# Patient Record
Sex: Female | Born: 1935 | Race: Black or African American | Hispanic: No | Smoking: Never smoker
Health system: Southern US, Community
[De-identification: ages and names within clinical notes are randomized; demographics above are authoritative.]

## PROBLEM LIST (undated history)

## (undated) DIAGNOSIS — E785 Hyperlipidemia, unspecified: Secondary | ICD-10-CM

## (undated) DIAGNOSIS — I809 Phlebitis and thrombophlebitis of unspecified site: Secondary | ICD-10-CM

## (undated) DIAGNOSIS — I519 Heart disease, unspecified: Secondary | ICD-10-CM

## (undated) DIAGNOSIS — G709 Myoneural disorder, unspecified: Secondary | ICD-10-CM

## (undated) DIAGNOSIS — Z972 Presence of dental prosthetic device (complete) (partial): Secondary | ICD-10-CM

## (undated) DIAGNOSIS — F419 Anxiety disorder, unspecified: Secondary | ICD-10-CM

## (undated) DIAGNOSIS — K08109 Complete loss of teeth, unspecified cause, unspecified class: Secondary | ICD-10-CM

## (undated) DIAGNOSIS — I10 Essential (primary) hypertension: Secondary | ICD-10-CM

## (undated) DIAGNOSIS — M1990 Unspecified osteoarthritis, unspecified site: Secondary | ICD-10-CM

## (undated) DIAGNOSIS — L98499 Non-pressure chronic ulcer of skin of other sites with unspecified severity: Secondary | ICD-10-CM

## (undated) DIAGNOSIS — K219 Gastro-esophageal reflux disease without esophagitis: Secondary | ICD-10-CM

## (undated) DIAGNOSIS — Z973 Presence of spectacles and contact lenses: Secondary | ICD-10-CM

## (undated) DIAGNOSIS — I5043 Acute on chronic combined systolic (congestive) and diastolic (congestive) heart failure: Secondary | ICD-10-CM

## (undated) DIAGNOSIS — I071 Rheumatic tricuspid insufficiency: Secondary | ICD-10-CM

## (undated) DIAGNOSIS — R569 Unspecified convulsions: Secondary | ICD-10-CM

## (undated) DIAGNOSIS — I429 Cardiomyopathy, unspecified: Secondary | ICD-10-CM

## (undated) DIAGNOSIS — Z9289 Personal history of other medical treatment: Secondary | ICD-10-CM

## (undated) DIAGNOSIS — I739 Peripheral vascular disease, unspecified: Secondary | ICD-10-CM

## (undated) HISTORY — PX: ABDOMINAL HYSTERECTOMY: SHX81

## (undated) HISTORY — DX: Personal history of other medical treatment: Z92.89

## (undated) HISTORY — PX: EYE SURGERY: SHX253

---

## 1989-08-03 HISTORY — PX: CATARACT EXTRACTION W/ INTRAOCULAR LENS  IMPLANT, BILATERAL: SHX1307

## 1989-08-03 HISTORY — PX: OTHER SURGICAL HISTORY: SHX169

## 1998-06-15 ENCOUNTER — Ambulatory Visit (HOSPITAL_COMMUNITY): Admission: RE | Admit: 1998-06-15 | Discharge: 1998-06-15 | Payer: Self-pay | Admitting: *Deleted

## 1999-02-06 ENCOUNTER — Ambulatory Visit (HOSPITAL_COMMUNITY): Admission: RE | Admit: 1999-02-06 | Discharge: 1999-02-06 | Payer: Self-pay | Admitting: Ophthalmology

## 1999-06-28 ENCOUNTER — Ambulatory Visit (HOSPITAL_COMMUNITY): Admission: RE | Admit: 1999-06-28 | Discharge: 1999-06-28 | Payer: Self-pay | Admitting: *Deleted

## 1999-07-06 ENCOUNTER — Ambulatory Visit (HOSPITAL_COMMUNITY): Admission: RE | Admit: 1999-07-06 | Discharge: 1999-07-06 | Payer: Self-pay | Admitting: *Deleted

## 2000-07-09 ENCOUNTER — Encounter: Payer: Self-pay | Admitting: Internal Medicine

## 2000-07-09 ENCOUNTER — Ambulatory Visit (HOSPITAL_COMMUNITY): Admission: RE | Admit: 2000-07-09 | Discharge: 2000-07-09 | Payer: Self-pay | Admitting: Internal Medicine

## 2001-07-14 ENCOUNTER — Ambulatory Visit (HOSPITAL_COMMUNITY): Admission: RE | Admit: 2001-07-14 | Discharge: 2001-07-14 | Payer: Self-pay | Admitting: Internal Medicine

## 2001-07-14 ENCOUNTER — Encounter: Payer: Self-pay | Admitting: Internal Medicine

## 2001-09-12 ENCOUNTER — Ambulatory Visit (HOSPITAL_COMMUNITY): Admission: RE | Admit: 2001-09-12 | Discharge: 2001-09-12 | Payer: Self-pay | Admitting: Ophthalmology

## 2002-03-09 ENCOUNTER — Encounter: Admission: RE | Admit: 2002-03-09 | Discharge: 2002-06-07 | Payer: Self-pay | Admitting: Internal Medicine

## 2002-07-15 ENCOUNTER — Ambulatory Visit (HOSPITAL_COMMUNITY): Admission: RE | Admit: 2002-07-15 | Discharge: 2002-07-15 | Payer: Self-pay | Admitting: Internal Medicine

## 2002-07-15 ENCOUNTER — Encounter: Payer: Self-pay | Admitting: Internal Medicine

## 2003-01-27 ENCOUNTER — Encounter: Payer: Self-pay | Admitting: Ophthalmology

## 2003-02-01 ENCOUNTER — Ambulatory Visit (HOSPITAL_COMMUNITY): Admission: RE | Admit: 2003-02-01 | Discharge: 2003-02-01 | Payer: Self-pay | Admitting: Ophthalmology

## 2003-02-25 ENCOUNTER — Encounter: Payer: Self-pay | Admitting: Internal Medicine

## 2003-02-25 ENCOUNTER — Ambulatory Visit (HOSPITAL_COMMUNITY): Admission: RE | Admit: 2003-02-25 | Discharge: 2003-02-25 | Payer: Self-pay | Admitting: Internal Medicine

## 2003-07-06 ENCOUNTER — Ambulatory Visit (HOSPITAL_COMMUNITY): Admission: RE | Admit: 2003-07-06 | Discharge: 2003-07-06 | Payer: Self-pay | Admitting: Internal Medicine

## 2003-07-06 ENCOUNTER — Encounter: Payer: Self-pay | Admitting: Internal Medicine

## 2003-08-20 ENCOUNTER — Ambulatory Visit (HOSPITAL_COMMUNITY): Admission: RE | Admit: 2003-08-20 | Discharge: 2003-08-20 | Payer: Self-pay | Admitting: Ophthalmology

## 2004-07-06 ENCOUNTER — Ambulatory Visit (HOSPITAL_COMMUNITY): Admission: RE | Admit: 2004-07-06 | Discharge: 2004-07-06 | Payer: Self-pay | Admitting: Internal Medicine

## 2005-07-09 ENCOUNTER — Ambulatory Visit (HOSPITAL_COMMUNITY): Admission: RE | Admit: 2005-07-09 | Discharge: 2005-07-09 | Payer: Self-pay | Admitting: Internal Medicine

## 2006-07-09 ENCOUNTER — Ambulatory Visit (HOSPITAL_COMMUNITY): Admission: RE | Admit: 2006-07-09 | Discharge: 2006-07-09 | Payer: Self-pay | Admitting: Internal Medicine

## 2007-07-14 ENCOUNTER — Ambulatory Visit (HOSPITAL_COMMUNITY): Admission: RE | Admit: 2007-07-14 | Discharge: 2007-07-14 | Payer: Self-pay | Admitting: Internal Medicine

## 2007-07-23 ENCOUNTER — Ambulatory Visit: Payer: Self-pay | Admitting: Gastroenterology

## 2007-08-06 ENCOUNTER — Ambulatory Visit: Payer: Self-pay | Admitting: Gastroenterology

## 2007-09-05 ENCOUNTER — Encounter: Admission: RE | Admit: 2007-09-05 | Discharge: 2007-09-05 | Payer: Self-pay | Admitting: Internal Medicine

## 2008-06-29 DIAGNOSIS — Z9289 Personal history of other medical treatment: Secondary | ICD-10-CM

## 2008-06-29 HISTORY — DX: Personal history of other medical treatment: Z92.89

## 2008-08-17 ENCOUNTER — Ambulatory Visit (HOSPITAL_COMMUNITY): Admission: RE | Admit: 2008-08-17 | Discharge: 2008-08-17 | Payer: Self-pay | Admitting: Internal Medicine

## 2009-08-22 ENCOUNTER — Ambulatory Visit (HOSPITAL_COMMUNITY): Admission: RE | Admit: 2009-08-22 | Discharge: 2009-08-22 | Payer: Self-pay | Admitting: Internal Medicine

## 2010-08-28 ENCOUNTER — Ambulatory Visit (HOSPITAL_COMMUNITY): Admission: RE | Admit: 2010-08-28 | Discharge: 2010-08-28 | Payer: Self-pay | Admitting: Internal Medicine

## 2011-04-09 DIAGNOSIS — B351 Tinea unguium: Secondary | ICD-10-CM | POA: Insufficient documentation

## 2011-04-20 NOTE — Op Note (Signed)
[facility]  Patient:    [name], [name] Visit Number: [id number] MRN: [id number]          Service Type: DSU Location: [facility] 2899 12 Attending Physician:  Karenann Cai Dictated by:   Marya Landry Carlyle Lipa., M.D. Admit Date:  09/12/2001                             Operative Report  PREOPERATIVE DIAGNOSIS:  Opaque posterior capsule, right eye.  POSTOPERATIVE DIAGNOSIS:  Opaque posterior capsule, right eye.  PROCEDURE:  YAG laser capsulotomy of the right eye.  ANESTHESIA:  none.  JUSTIFICATION FOR PROCEDURE:  This is a 77 year old lady who underwent cataract extraction from the right eye several  months ago.  She has recently begun to complain of difficulty seeing to read with blurring of vision.  She was evaluated and found to have a visual acuity on the right best corrected to 20/50 with opacification of the posterior capsule.  YAG  laser capsulotomy was recommended, and she is admitted at this time for that purpose.  DESCRIPTION OF PROCEDURE:  The patient was brought to the laser room and positioned appropriately behind the laser.  Approximately 207 applications of laser energy at 2.5 millijoules were applied to the posterior capsule, obtaining an opening of 3-4 mm.  The patient tolerated the procedure well and is discharged in satisfactory condition with instructions to see me in the office this afternoon for evaluation of her intraocular pressure.  DISCHARGE DIAGNOSIS:  Opaque posterior capsule, right eye. Dictated by:   Marya Landry Carlyle Lipa., M.D. Attending Physician:  Karenann Cai DD:  09/12/01 TD:  09/12/01 Job: [id number]

## 2011-04-20 NOTE — Op Note (Signed)
NAMEPALAK, [name]                          ACCOUNT NO.:  [id number]   MEDICAL RECORD NO.:  [id number]                   PATIENT TYPE:  OIB   LOCATION:  2855                                 FACILITY:  [facility]   PHYSICIAN:  Salley Scarlet., M.D.         DATE OF BIRTH:  [birth year]   DATE OF PROCEDURE:  02/01/2003  DATE OF DISCHARGE:  02/01/2003                                 OPERATIVE REPORT   PREOPERATIVE DIAGNOSIS:  Immature cataract, left eye.   POSTOPERATIVE DIAGNOSIS:  Immature cataract, left eye.   PROCEDURE:  Kelman phacoemulsification, cataract, left eye.   ANESTHESIA:  Local using Xylocaine 2% with Marcaine 0.75% and Wydase.   JUSTIFICATION FOR PROCEDURE:  This is a 77 year old lady who is diabetic,  who has had cataract extracted from the right eye several years ago.  She  now complains of blurring of vision with difficulty seeing to read.  She was  evaluated and found to have a visual acuity in the left eye best corrected  at 20/50.  There was a posterior subcapsular cataract with 2+ nuclear  sclerosis present.  Cataract extraction with intraocular lens implantation  was recommended.  She was admitted at this time for that purpose.   DESCRIPTION OF PROCEDURE:  Under the influence of IV sedation, a Van Lint  akinesia and retrobulbar anesthesia were given.  The patient was prepped and  draped in the usual manner.  The lid speculum was inserted under the upper  and lower lid of the left eye and a 4-0 silk traction suture was passed  through the belly of the superior rectus muscle for traction.  A fornix-  based conjunctival flap was turned and hemostasis achieved by using cautery.  An incision was made in the sclera approximately 1 mm posterior to the  limbus.  This incision was dissected down to clear cornea using the crescent  blade.  A side port incision was made at the 1:30 clock hour position.  Occucoat was injected into the eye through the side port incision.   The  anterior chamber was then entered through the corneoscleral tunnel incision  at the 11:30 clock hour position using a 2.7 mm keratome.  An anterior  capsulotomy was done using a bent 25-gauge needle.  The nucleus was  hydrodissected using Xylocaine.  The KPE handpiece was passed into the eye  and the nucleus was emulsified without difficulty.  The residual cortical  material was aspirated.  The posterior capsule was polished using an olive-  tip polisher.  The wound was widened slightly to accommodate an oval 5 x 6  PMMA lens.  This lens was seated into the eye behind the iris without  difficulty.  The anterior chamber was reformed and the pupil was constricted  using Miochol.  The corneoscleral wound was then closed by using a single  horizontal suture of 10-0 nylon.  After it was ascertained that the wound  was  airtight and watertight, the conjunctiva was closed using thermal  cautery.  Celestone 1 mL and 0.5 mL of gentamicin were injected  subconjuctivally.  Maxitrol ophthalmic ointment and Pilopine ointment were  applied along with a patch and Fox shield.  The patient tolerated the  procedure well and was discharged to the postanesthesia recovery room in  satisfactory condition.  She was instructed to rest today, to take Vicodin  every four hours as needed for pain, and to see me in the office tomorrow  for further evaluation.   DISCHARGE DIAGNOSIS:  Immature cataract, left eye.                                               Salley Scarlet., M.D.   TB/MEDQ  D:  02/01/2003  T:  02/02/2003  Job:  [id number]

## 2011-05-02 ENCOUNTER — Encounter: Payer: Self-pay | Admitting: Podiatry

## 2011-05-02 DIAGNOSIS — E1142 Type 2 diabetes mellitus with diabetic polyneuropathy: Secondary | ICD-10-CM | POA: Insufficient documentation

## 2011-05-02 DIAGNOSIS — B351 Tinea unguium: Secondary | ICD-10-CM

## 2011-05-02 NOTE — Assessment & Plan Note (Signed)
A: Onychomycosis. P: Debride painful mycotic toenails.

## 2011-08-07 ENCOUNTER — Other Ambulatory Visit (HOSPITAL_COMMUNITY): Payer: Self-pay | Admitting: Internal Medicine

## 2011-08-07 DIAGNOSIS — Z1231 Encounter for screening mammogram for malignant neoplasm of breast: Secondary | ICD-10-CM

## 2011-08-08 ENCOUNTER — Other Ambulatory Visit: Payer: Self-pay | Admitting: Family Medicine

## 2011-08-08 DIAGNOSIS — R52 Pain, unspecified: Secondary | ICD-10-CM

## 2011-08-08 DIAGNOSIS — R609 Edema, unspecified: Secondary | ICD-10-CM

## 2011-08-09 ENCOUNTER — Ambulatory Visit
Admission: RE | Admit: 2011-08-09 | Discharge: 2011-08-09 | Disposition: A | Discharge status: Home / Self Care | Payer: Medicare Other | Source: Ambulatory Visit | Attending: Family Medicine | Admitting: Family Medicine

## 2011-08-09 DIAGNOSIS — R609 Edema, unspecified: Secondary | ICD-10-CM

## 2011-08-09 DIAGNOSIS — R52 Pain, unspecified: Secondary | ICD-10-CM

## 2011-09-03 ENCOUNTER — Ambulatory Visit (HOSPITAL_COMMUNITY)
Admission: RE | Admit: 2011-09-03 | Discharge: 2011-09-03 | Disposition: A | Discharge status: Home / Self Care | Payer: Medicare Other | Source: Ambulatory Visit | Attending: Internal Medicine | Admitting: Internal Medicine

## 2011-09-03 DIAGNOSIS — Z1231 Encounter for screening mammogram for malignant neoplasm of breast: Secondary | ICD-10-CM

## 2011-11-30 ENCOUNTER — Observation Stay (HOSPITAL_COMMUNITY)
Admission: EM | Admit: 2011-11-30 | Discharge: 2011-12-02 | Disposition: A | Discharge status: Home / Self Care | Payer: Medicare Other | Attending: Internal Medicine | Admitting: Internal Medicine

## 2011-11-30 ENCOUNTER — Emergency Department (HOSPITAL_COMMUNITY): Payer: Medicare Other

## 2011-11-30 DIAGNOSIS — R600 Localized edema: Secondary | ICD-10-CM

## 2011-11-30 DIAGNOSIS — F411 Generalized anxiety disorder: Secondary | ICD-10-CM | POA: Insufficient documentation

## 2011-11-30 DIAGNOSIS — L97809 Non-pressure chronic ulcer of other part of unspecified lower leg with unspecified severity: Secondary | ICD-10-CM

## 2011-11-30 DIAGNOSIS — E1149 Type 2 diabetes mellitus with other diabetic neurological complication: Secondary | ICD-10-CM | POA: Insufficient documentation

## 2011-11-30 DIAGNOSIS — E1142 Type 2 diabetes mellitus with diabetic polyneuropathy: Secondary | ICD-10-CM | POA: Insufficient documentation

## 2011-11-30 DIAGNOSIS — I83009 Varicose veins of unspecified lower extremity with ulcer of unspecified site: Secondary | ICD-10-CM

## 2011-11-30 DIAGNOSIS — I10 Essential (primary) hypertension: Secondary | ICD-10-CM | POA: Diagnosis present

## 2011-11-30 DIAGNOSIS — L97209 Non-pressure chronic ulcer of unspecified calf with unspecified severity: Principal | ICD-10-CM | POA: Insufficient documentation

## 2011-11-30 DIAGNOSIS — I809 Phlebitis and thrombophlebitis of unspecified site: Secondary | ICD-10-CM

## 2011-11-30 DIAGNOSIS — L98499 Non-pressure chronic ulcer of skin of other sites with unspecified severity: Secondary | ICD-10-CM

## 2011-11-30 DIAGNOSIS — R609 Edema, unspecified: Secondary | ICD-10-CM

## 2011-11-30 DIAGNOSIS — F419 Anxiety disorder, unspecified: Secondary | ICD-10-CM | POA: Diagnosis present

## 2011-11-30 DIAGNOSIS — K219 Gastro-esophageal reflux disease without esophagitis: Secondary | ICD-10-CM | POA: Diagnosis present

## 2011-11-30 DIAGNOSIS — I739 Peripheral vascular disease, unspecified: Secondary | ICD-10-CM | POA: Insufficient documentation

## 2011-11-30 DIAGNOSIS — E785 Hyperlipidemia, unspecified: Secondary | ICD-10-CM | POA: Insufficient documentation

## 2011-11-30 HISTORY — DX: Phlebitis and thrombophlebitis of unspecified site: I80.9

## 2011-11-30 HISTORY — DX: Non-pressure chronic ulcer of skin of other sites with unspecified severity: L98.499

## 2011-11-30 HISTORY — DX: Essential (primary) hypertension: I10

## 2011-11-30 HISTORY — DX: Hyperlipidemia, unspecified: E78.5

## 2011-11-30 LAB — COMPREHENSIVE METABOLIC PANEL WITH GFR
ALT: 7 U/L (ref 0–35)
Albumin: 3 g/dL — ABNORMAL LOW (ref 3.5–5.2)
BUN: 16 mg/dL (ref 6–23)
Chloride: 92 meq/L — ABNORMAL LOW (ref 96–112)
Creatinine, Ser: 0.84 mg/dL (ref 0.50–1.10)
GFR calc Af Amer: 75 mL/min — ABNORMAL LOW (ref >90)
GFR calc non Af Amer: 65 mL/min — ABNORMAL LOW (ref >90)
Glucose, Bld: 150 mg/dL — ABNORMAL HIGH (ref 70–99)
Potassium: 4 meq/L (ref 3.5–5.1)
Total Bilirubin: 0.5 mg/dL (ref 0.3–1.2)
Total Protein: 6.8 g/dL (ref 6.0–8.3)

## 2011-11-30 LAB — COMPREHENSIVE METABOLIC PANEL
AST: 12 U/L (ref 0–37)
Alkaline Phosphatase: 53 U/L (ref 39–117)
CO2: 27 mEq/L (ref 19–32)
Calcium: 8.9 mg/dL (ref 8.4–10.5)
Sodium: 129 mEq/L — ABNORMAL LOW (ref 135–145)

## 2011-11-30 LAB — CREATININE, SERUM: Creatinine, Ser: 0.8 mg/dL (ref 0.50–1.10)

## 2011-11-30 LAB — CBC
HCT: 31.4 % — ABNORMAL LOW (ref 36.0–46.0)
Hemoglobin: 10.2 g/dL — ABNORMAL LOW (ref 12.0–15.0)
Hemoglobin: 10.9 g/dL — ABNORMAL LOW (ref 12.0–15.0)
MCH: 25.5 pg — ABNORMAL LOW (ref 26.0–34.0)
MCH: 25.9 pg — ABNORMAL LOW (ref 26.0–34.0)
MCHC: 32.5 g/dL (ref 30.0–36.0)
MCHC: 33.2 g/dL (ref 30.0–36.0)
MCV: 78.5 fL (ref 78.0–100.0)
Platelets: 294 10*3/uL (ref 150–400)
RBC: 4 MIL/uL (ref 3.87–5.11)
RDW: 13.2 % (ref 11.5–15.5)
WBC: 8 10*3/uL (ref 4.0–10.5)

## 2011-11-30 LAB — DIFFERENTIAL
Basophils Absolute: 0 10*3/uL (ref 0.0–0.1)
Basophils Relative: 0 % (ref 0–1)
Eosinophils Absolute: 0.1 K/uL (ref 0.0–0.7)
Eosinophils Relative: 2 % (ref 0–5)
Lymphocytes Relative: 26 % (ref 12–46)
Lymphs Abs: 2.1 10*3/uL (ref 0.7–4.0)
Monocytes Absolute: 1 10*3/uL (ref 0.1–1.0)
Monocytes Relative: 13 % — ABNORMAL HIGH (ref 3–12)
Neutro Abs: 4.7 10*3/uL (ref 1.7–7.7)
Neutrophils Relative %: 59 % (ref 43–77)

## 2011-11-30 MED ORDER — INSULIN ASPART 100 UNIT/ML ~~LOC~~ SOLN
0.0000 [IU] | Freq: Three times a day (TID) | SUBCUTANEOUS | Status: DC
  Filled 2011-11-30: qty 3

## 2011-11-30 MED ORDER — OXYCODONE HCL 5 MG PO TABS
5.0000 mg | ORAL_TABLET | ORAL | Status: DC | PRN
  Administered 2011-11-30 – 2011-12-02 (×5): 5 mg via ORAL
  Filled 2011-11-30 (×5): qty 1

## 2011-11-30 MED ORDER — POLYETHYLENE GLYCOL 3350 17 G PO PACK
17.0000 g | PACK | Freq: Every day | ORAL | Status: DC | PRN
  Filled 2011-11-30: qty 1

## 2011-11-30 MED ORDER — LOSARTAN POTASSIUM 50 MG PO TABS
100.0000 mg | ORAL_TABLET | Freq: Every day | ORAL | Status: DC
  Administered 2011-12-01 – 2011-12-02 (×2): 100 mg via ORAL
  Filled 2011-11-30 (×3): qty 2

## 2011-11-30 MED ORDER — METFORMIN HCL 500 MG PO TABS
1000.0000 mg | ORAL_TABLET | Freq: Two times a day (BID) | ORAL | Status: DC
  Administered 2011-12-01 – 2011-12-02 (×3): 1000 mg via ORAL
  Filled 2011-11-30 (×5): qty 2

## 2011-11-30 MED ORDER — FENTANYL CITRATE 0.05 MG/ML IJ SOLN: 25.0000 ug | INTRAMUSCULAR | Status: DC | PRN

## 2011-11-30 MED ORDER — ACETAMINOPHEN 650 MG RE SUPP: 650.0000 mg | Freq: Four times a day (QID) | RECTAL | Status: DC | PRN

## 2011-11-30 MED ORDER — CLONIDINE HCL 0.3 MG PO TABS
0.3000 mg | ORAL_TABLET | Freq: Three times a day (TID) | ORAL | Status: DC
  Administered 2011-12-01 – 2011-12-02 (×3): 0.3 mg via ORAL
  Filled 2011-11-30 (×8): qty 1

## 2011-11-30 MED ORDER — DOCUSATE SODIUM 100 MG PO CAPS
100.0000 mg | ORAL_CAPSULE | Freq: Two times a day (BID) | ORAL | Status: DC
  Administered 2011-11-30 – 2011-12-02 (×4): 100 mg via ORAL
  Filled 2011-11-30 (×3): qty 1

## 2011-11-30 MED ORDER — MORPHINE SULFATE 2 MG/ML IJ SOLN
2.0000 mg | INTRAMUSCULAR | Status: DC | PRN
  Administered 2011-11-30: 2 mg via INTRAVENOUS
  Filled 2011-11-30: qty 1

## 2011-11-30 MED ORDER — ACETAMINOPHEN 325 MG PO TABS: 650.0000 mg | ORAL_TABLET | Freq: Four times a day (QID) | ORAL | Status: DC | PRN

## 2011-11-30 MED ORDER — ASPIRIN 81 MG PO CHEW
81.0000 mg | CHEWABLE_TABLET | Freq: Every day | ORAL | Status: DC
  Administered 2011-11-30 – 2011-12-02 (×3): 81 mg via ORAL
  Filled 2011-11-30 (×3): qty 1

## 2011-11-30 MED ORDER — METOPROLOL SUCCINATE ER 100 MG PO TB24
100.0000 mg | ORAL_TABLET | Freq: Two times a day (BID) | ORAL | Status: DC
  Administered 2011-11-30 – 2011-12-02 (×4): 100 mg via ORAL
  Filled 2011-11-30 (×5): qty 1

## 2011-11-30 MED ORDER — CLINDAMYCIN PHOSPHATE 900 MG/50ML IV SOLN
900.0000 mg | Freq: Once | INTRAVENOUS | Status: DC
  Administered 2011-11-30: 900 mg via INTRAVENOUS
  Filled 2011-11-30 (×2): qty 50

## 2011-11-30 MED ORDER — NISOLDIPINE ER 17 MG PO TB24
34.0000 mg | ORAL_TABLET | Freq: Every day | ORAL | Status: DC
  Administered 2011-12-01 – 2011-12-02 (×2): 34 mg via ORAL
  Filled 2011-11-30 (×3): qty 2

## 2011-11-30 MED ORDER — ALUM & MAG HYDROXIDE-SIMETH 200-200-20 MG/5ML PO SUSP: 30.0000 mL | Freq: Four times a day (QID) | ORAL | Status: DC | PRN

## 2011-11-30 MED ORDER — TRAMADOL HCL 50 MG PO TABS
50.0000 mg | ORAL_TABLET | Freq: Four times a day (QID) | ORAL | Status: DC | PRN
  Filled 2011-11-30 (×2): qty 1

## 2011-11-30 MED ORDER — PANTOPRAZOLE SODIUM 40 MG PO TBEC
80.0000 mg | DELAYED_RELEASE_TABLET | Freq: Every day | ORAL | Status: DC
  Administered 2011-11-30 – 2011-12-02 (×3): 80 mg via ORAL
  Filled 2011-11-30 (×2): qty 1

## 2011-11-30 MED ORDER — ENOXAPARIN SODIUM 40 MG/0.4ML ~~LOC~~ SOLN
40.0000 mg | SUBCUTANEOUS | Status: DC
  Administered 2011-11-30 – 2011-12-01 (×2): 40 mg via SUBCUTANEOUS
  Filled 2011-11-30 (×3): qty 0.4

## 2011-11-30 MED ORDER — MORPHINE SULFATE 2 MG/ML IJ SOLN: 2.0000 mg | INTRAMUSCULAR | Status: DC | PRN

## 2011-11-30 MED ORDER — LOSARTAN POTASSIUM-HCTZ 100-25 MG PO TABS: 1.0000 | ORAL_TABLET | Freq: Every day | ORAL | Status: DC

## 2011-11-30 MED ORDER — METFORMIN HCL 500 MG PO TABS: 1000.0000 mg | ORAL_TABLET | Freq: Two times a day (BID) | ORAL | Status: DC

## 2011-11-30 MED ORDER — ONDANSETRON HCL 4 MG/2ML IJ SOLN: 4.0000 mg | Freq: Four times a day (QID) | INTRAMUSCULAR | Status: DC | PRN

## 2011-11-30 MED ORDER — ONDANSETRON HCL 4 MG PO TABS: 4.0000 mg | ORAL_TABLET | Freq: Four times a day (QID) | ORAL | Status: DC | PRN

## 2011-11-30 MED ORDER — ALPRAZOLAM 0.25 MG PO TABS
0.2500 mg | ORAL_TABLET | Freq: Every day | ORAL | Status: DC
  Administered 2011-12-01 – 2011-12-02 (×2): 0.25 mg via ORAL
  Filled 2011-11-30 (×3): qty 1

## 2011-11-30 MED ORDER — FENTANYL CITRATE 0.05 MG/ML IJ SOLN
50.0000 ug | Freq: Once | INTRAMUSCULAR | Status: AC
  Administered 2011-11-30: 50 ug via INTRAVENOUS
  Filled 2011-11-30: qty 2

## 2011-11-30 MED ORDER — GABAPENTIN 300 MG PO CAPS
300.0000 mg | ORAL_CAPSULE | Freq: Two times a day (BID) | ORAL | Status: DC
  Administered 2011-11-30 – 2011-12-02 (×4): 300 mg via ORAL
  Filled 2011-11-30 (×5): qty 1

## 2011-11-30 MED ORDER — HYDROCHLOROTHIAZIDE 25 MG PO TABS
25.0000 mg | ORAL_TABLET | Freq: Every day | ORAL | Status: DC
  Administered 2011-12-01 – 2011-12-02 (×2): 25 mg via ORAL
  Filled 2011-11-30 (×3): qty 1

## 2011-11-30 NOTE — ED Notes (Signed)
Pt requesting meds for pain. 

## 2011-11-30 NOTE — ED Notes (Signed)
Bilateral lower leg edema with drainage began over 2 weeks ago and progressively getting worse

## 2011-11-30 NOTE — ED Notes (Signed)
3002-01 READY

## 2011-11-30 NOTE — H&P (Signed)
PCP:   [name]., MD   Chief Complaint:  Leg pain  HPI: Patient is a 77 year old African American female with past medical history of diabetes and hypertension who for the last 5 months has had problems with multiple small leg ulcerations on the right lower extremity starting just above the knee. Usually these wounds are anywhere from half an inch to 1-1/2 inches of different shapes that looked to be superficial and having some mild drainage. The patient says that initially she was on some antibiotics in the beginning and has been followed by her PCP with some creams. In the last few weeks, the patient started developing leg ulcerations on the left leg as well.  These were more extensive and larger to the point where it involved at least 60% of the anterior aspect of her left leg. The patient decided to come the emergency room when her left leg it started to hurt in various areas.  In the emergency room it was noted that she had normal white count with no shift and no fever. In discussion with the emergency room physician, it was unclear if she needed extensive wound care and is how much of this was true acute infection requiring antibiotics. Clindamycin was initially ordered by the ER physician, but after I reviewed the labs I asked the antibiotics to be stopped and she had only started to receive them until we can confirm true fever or infection.  Review of Systems:  When I saw the patient, she was doing okay. She felt a little shaky 2 she not eaten. She denied any headaches, vision changes, dysphasia, chest pain, palpitations or shortness of breath, wheeze, cough, abdominal pain, hematuria, dysuria, constipation, diarrhea, nausea or vomiting. Her positive review of systems only related to her legs. She has some chronic numbness in bilateral feet from neuropathy. She has the chronic ulcerations and her left leg has been giving her pain. Prior to this her ulcerations did not give her much  discomfort at all.  Past Medical History: Past Medical History  Diagnosis Date  . Diabetes mellitus   . Hypertension   . Hyperlipemia   . Phlebitis 11/30/11    RLE  . Skin ulcer(s) 11/30/11    BLE; draining   Past Surgical History  Procedure Date  . Abdominal hysterectomy   . Cataract extraction w/ intraocular lens  implant, bilateral 1990's  . Back surgery   . Blood clot 1990's    RLE; "cut out"    Medications: Prior to Admission medications   Medication Sig Start Date End Date Taking? Authorizing Provider  ALPRAZolam (XANAX) 0.25 MG tablet Take 0.25 mg by mouth daily.     Yes Historical Provider, MD  aspirin 81 MG chewable tablet Chew 81 mg by mouth daily.     Yes Historical Provider, MD  cloNIDine (CATAPRES) 0.3 MG tablet Take 0.3 mg by mouth 3 (three) times daily.     Yes Historical Provider, MD  gabapentin (NEURONTIN) 300 MG capsule Take 300 mg by mouth 2 (two) times daily.     Yes Historical Provider, MD  lisinopril (PRINIVIL,ZESTRIL) 20 MG tablet Take 20 mg by mouth 2 (two) times daily.     Yes Historical Provider, MD  losartan-hydrochlorothiazide (HYZAAR) 100-25 MG per tablet Take 1 tablet by mouth daily.     Yes Historical Provider, MD  metFORMIN (GLUCOPHAGE) 1000 MG tablet Take 1,000 mg by mouth 2 (two) times daily with a meal.     Yes Historical Provider, MD  metoprolol (TOPROL-XL) 100 MG 24 hr tablet Take 100 mg by mouth 2 (two) times daily.     Yes Historical Provider, MD  nisoldipine (SULAR) 34 MG 24 hr tablet Take 34 mg by mouth daily.     Yes Historical Provider, MD  omeprazole (PRILOSEC) 40 MG capsule Take 40 mg by mouth 2 (two) times daily.     Yes Historical Provider, MD  traMADol (ULTRAM) 50 MG tablet Take 50 mg by mouth every 6 (six) hours as needed. Maximum dose= 8 tablets per day. For pain    Yes Historical Provider, MD    Allergies:  No Known Allergies  Social History:  reports that she has never smoked. She has never used smokeless tobacco. She  reports that she does not drink alcohol or use illicit drugs. the patient lives at home by herself with her daughter and other family members who live nearby. The patient is normally at baseline able to participate in full activities of daily living with a cane.  Family History: History reviewed. No pertinent family history.  Physical Exam: Filed Vitals:   11/30/11 1053 11/30/11 1101 11/30/11 1400  BP: 112/45  107/60  Pulse: 65  57  Temp: 97.8 F (36.6 C)  97 F (36.1 C)  TempSrc: Oral  Oral  Resp: 16    Height:  5\' 2"  (1.575 m)   Weight:  74.844 kg (165 lb)   SpO2: 100%  97%   General: Alert and oriented x3, no acute distress, looks about stated age, fatigued. HEENT: Normocephalic, atraumatic, mucous membranes are slightly dry Cardiovascular: Regular rate and rhythm S1-S2 next line lungs:" Bilaterally Abdomen: Soft, nontender, nondistended, decreased bowel sounds Extremities: As described above, The wounds can be described as foul-smelling and reported there were some drainage in the past few weeks, although currently they are relatively dry. There are some with yellow slough and others with black eschar noted.  Around the ulcerations is some mild erythema.  Her left lower extremity is mildly tender.   Labs on Admission:   [facility] 11/30/11 1140  NA 129*  K 4.0  CL 92*  CO2 27  GLUCOSE 150*  BUN 16  CREATININE 0.84  CALCIUM 8.9  MG --  PHOS --    Basename 11/30/11 1140  AST 12  ALT 7  ALKPHOS 53  BILITOT 0.5  PROT 6.8  ALBUMIN 3.0*    Basename 11/30/11 1140  WBC 8.0  NEUTROABS 4.7  HGB 10.2*  HCT 31.4*  MCV 78.5  PLT 294    Radiological Exams on Admission: Dg Tibia/fibula Left 11/30/2011    IMPRESSION: Chronic changes.  No active bony pathology.    Dg Tibia/fibula Right 11/30/2011    IMPRESSION: Severe degenerative change of the knee.  No acute bony pathology.     Assessment/Plan Present on Admission:  .DM type 2 with diabetic peripheral  neuropathy: Continue Neurontin plus oral medications plus sliding-scale  .HTN (hypertension): Continue antihypertensives. Please note that I did not continue her lisinopril and she's already on losartan plus HCTZ.  Marland KitchenUlcer of other part of lower limb: Despite the foul-smelling odor and quite extensive wounds, they're almost all completely dry. I discussed this case with Dr. Lindie Spruce of general surgery. He would be reluctant to debride these wounds when they're currently more dry in nature. We'll hold off on antibiotics, she has a temperature or elevation in white count. For now we'll plan to consult wound care further recommendations.  .Anxiety: Continue benzodiazepines  .GERD (gastroesophageal reflux disease): Continue  PPI  After discussion with the patient, she is to be a DO NOT RESUSCITATE.  We will respect these wishes.  I anticipate her length of stay to be perhaps only a day or 2* based on wound care recommendations and questionable need for antibiotics*  Time spent on this patient: 55 minutes, this includes decision making and examination Hollice Espy [phone] 11/30/2011, 6:25 PM

## 2011-11-30 NOTE — Progress Notes (Signed)
Patient, [name] is a 76 year old African American female.  Patient is in good spirits and surrounded by several family members.  Patient expressed appreciation for Chaplain's presence and conversation.  Will follow-up as needed.

## 2011-11-30 NOTE — Progress Notes (Signed)
Open sores on rt. Lower leg.

## 2011-11-30 NOTE — Progress Notes (Signed)
Variety of open sores on bil  Lower extermities, some with yellow slough, and others with black escar noted.

## 2011-11-30 NOTE — ED Notes (Signed)
Md at bedside with doppler.  Peripheral pulses verified and good

## 2011-11-30 NOTE — ED Provider Notes (Cosign Needed)
History     CSN: [id number]  Arrival date & time 11/30/11  1049   First MD Initiated Contact with Patient 11/30/11 1123      Chief Complaint  Patient presents with  . Leg Swelling    (Consider location/radiation/quality/duration/timing/severity/associated sxs/prior treatment) HPI Comments: Patient reports bilateral lower extremity swelling as well as skin ulcers and drainage. Patient had been seen previously by her primary care physician approximately 2 months ago when a first lesion developed in the right lower extremity. She has been using compression stockings as well as a cream containing Voltaren (according to the patient's family member). The patient apparently did not reveal the lesions were becoming worse and now has spread to the left lower extremity until just recently. There's been yellowish drainage as well as a foul odor. The patient does have a history of diabetes and reportedly has poor circulation. She was seen by the nurse practitioner associate with Dr. Renae Gloss today and was referred here to the [facility] department for further evaluation and treatment. The patient denies any fevers or chills. She does have pain in her legs intermittently. She denies any new numbness or weakness. She reports palpation and weightbearing seems to make the pain worse.  The history is provided by the patient and a relative.    Past Medical History  Diagnosis Date  . Diabetes mellitus   . Hypertension   . Hyperlipemia     History reviewed. No pertinent past surgical history.  No family history on file.  History  Substance Use Topics  . Smoking status: Never Smoker   . Smokeless tobacco: Not on file  . Alcohol Use: No    OB History    Grav Para Term Preterm Abortions TAB SAB Ect Mult Living                  Review of Systems  Musculoskeletal: Positive for arthralgias.  Skin: Positive for color change and wound.  All other systems reviewed and are negative.    Allergies    Review of patient's allergies indicates no known allergies.  Home Medications   Current Outpatient Rx  Name Route Sig Dispense Refill  . ALPRAZOLAM 0.25 MG PO TABS Oral Take 0.25 mg by mouth daily.      . ASPIRIN 81 MG PO CHEW Oral Chew 81 mg by mouth daily.      Marland Kitchen CLONIDINE HCL 0.3 MG PO TABS Oral Take 0.3 mg by mouth 3 (three) times daily.      Marland Kitchen GABAPENTIN 300 MG PO CAPS Oral Take 300 mg by mouth 2 (two) times daily.      Marland Kitchen LISINOPRIL 20 MG PO TABS Oral Take 20 mg by mouth 2 (two) times daily.      Marland Kitchen LOSARTAN POTASSIUM-HCTZ 100-25 MG PO TABS Oral Take 1 tablet by mouth daily.      Marland Kitchen METFORMIN HCL 1000 MG PO TABS Oral Take 1,000 mg by mouth 2 (two) times daily with a meal.      . METOPROLOL SUCCINATE ER 100 MG PO TB24 Oral Take 100 mg by mouth 2 (two) times daily.      Marland Kitchen NISOLDIPINE 34 MG PO TB24 Oral Take 34 mg by mouth daily.      Marland Kitchen OMEPRAZOLE 40 MG PO CPDR Oral Take 40 mg by mouth 2 (two) times daily.      . TRAMADOL HCL 50 MG PO TABS Oral Take 50 mg by mouth every 6 (six) hours as needed. Maximum dose= 8  tablets per day. For pain       BP 112/45  Pulse 65  Temp(Src) 97.8 F (36.6 C) (Oral)  Resp 16  Ht 5\' 2"  (1.575 m)  Wt 165 lb (74.844 kg)  BMI 30.18 kg/m2  SpO2 100%  Physical Exam  Nursing note and vitals reviewed. Constitutional: She appears well-developed and well-nourished.  HENT:  Head: Normocephalic.  Cardiovascular: Normal rate.   Pulmonary/Chest: Effort normal.  Abdominal: Soft.  Musculoskeletal:       Legs:      DP pulses dopplerable easily bilaterally  Neurological: She is alert.  Skin: Skin is warm.       See extremity exam    ED Course  Procedures (including critical care time)  Labs Reviewed  CBC - Abnormal; Notable for the following:    Hemoglobin 10.2 (*)    HCT 31.4 (*)    MCH 25.5 (*)    All other components within normal limits  DIFFERENTIAL - Abnormal; Notable for the following:    Monocytes Relative 13 (*)    All other components  within normal limits  COMPREHENSIVE METABOLIC PANEL - Abnormal; Notable for the following:    Sodium 129 (*)    Chloride 92 (*)    Glucose, Bld 150 (*)    Albumin 3.0 (*)    GFR calc non Af Amer 65 (*)    GFR calc Af Amer 75 (*)    All other components within normal limits   Dg Tibia/fibula Left  11/30/2011  *RADIOLOGY REPORT*  Clinical Data: Infection  LEFT TIBIA AND FIBULA - 2 VIEW  Comparison: None.  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  No acute fracture.  No dislocation.  Osteopenia.  No destructive bone lesion.  IMPRESSION: Chronic changes.  No active bony pathology.  Original Report Authenticated By: Donavan Burnet, M.D.   Dg Tibia/fibula Right  11/30/2011  *RADIOLOGY REPORT*  Clinical Data: Legs worse  RIGHT TIBIA AND FIBULA - 2 VIEW  Comparison: None.  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  There is marked irregularity of the medial compartment.  No obvious lytic bone lesion.  No acute fracture and no dislocation.  Diffuse soft tissue swelling is noted.  Multiple soft tissue calcifications in the leg likely represent phleboliths. Osteopenia.  IMPRESSION: Severe degenerative change of the knee.  No acute bony pathology.  Original Report Authenticated By: Donavan Burnet, M.D.     1. Venous stasis ulcers   2. Peripheral edema     RA sat is 100% and is normal  MDM    Likely venous stasis ulcers now secondarily infection.  Will get labs, plain films.  Smells of infection, possibly pseudomonas.  Will start IV Clindamycin and admit      1:55 PM Plain films were unremarkable per radiologist, I viewed them myself.  I spoke to Dr. Rito Ehrlich, [facility] who will admit to obs status and will monitor wounds and observe for now.  He asked that clindamycin be stopped.        [name]. Joriel Streety, MD 11/30/11 1401

## 2011-11-30 NOTE — ED Notes (Addendum)
Pt has bilateral lower leg edema and drainage.  Condition has progressively gotten worse.  Pt has large sores on both legs that were stuck to her knee high socks.  Pt states the sores started on rt leg first.

## 2011-12-01 LAB — BASIC METABOLIC PANEL
BUN: 11 mg/dL (ref 6–23)
CO2: 29 mEq/L (ref 19–32)
Chloride: 98 mEq/L (ref 96–112)
Creatinine, Ser: 0.73 mg/dL (ref 0.50–1.10)
Glucose, Bld: 115 mg/dL — ABNORMAL HIGH (ref 70–99)

## 2011-12-01 LAB — GLUCOSE, CAPILLARY
Glucose-Capillary: 86 mg/dL (ref 70–99)
Glucose-Capillary: 130 mg/dL — ABNORMAL HIGH (ref 70–99)

## 2011-12-01 NOTE — Progress Notes (Signed)
Subjective: 77 year old patient who was admitted yesterday due to worsening ulceration involving her anterior lower extremities. These have been problematic for some time but more recently have worsened. She was seen at her primary care physician's office yesterday and sent to the hospital for consideration of admission and parenteral antibiotics. The ulcers however had been quite dry and appear chronic in her temperature and white count have been normal. There has been no drainage. She states she has had outpatient evaluation it sounds like both venous and arterial lower extremity Doppler evaluation;;  she does have a history of diabetic neuropathy and PAD.   Objective: Vital signs in last 24 hours: Temp:  [97 F (36.1 C)-98.8 F (37.1 C)] 98.6 F (37 C) (12/29 0600) Pulse Rate:  [57-68] 59  (12/29 1017) Resp:  [16-18] 18  (12/29 0600) BP: (107-155)/(60-89) 115/79 mmHg (12/29 1017) SpO2:  [95 %-97 %] 95 % (12/29 0600) Weight change:     Intake/Output from previous day:   Intake/Output this shift: Total I/O In: 360 [P.O.:360] Out: -   General examination- patient is alert; no distress ENT unremarkable Chest is clear Cardiovascular exam regular rhythm without tachycardia Abdomen soft and nontender Extremities revealed- scattered dry areas of ulceration involving both anterior lower extremities from the upper thigh region to the ankles but sparing the feet; these ulcers are dry and do not appear to be inflamed and there is no drainage;  there is an area of confluence involving the left anterior leg about 12-16 cm in diameter. Lab Results:  Basename 11/30/11 1924 11/30/11 1140  WBC 7.2 8.0  HGB 10.9* 10.2*  HCT 32.8* 31.4*  PLT 320 294   BMET  Basename 12/01/11 0838 11/30/11 1924 11/30/11 1140  NA 134* -- 129*  K 3.9 -- 4.0  CL 98 -- 92*  CO2 29 -- 27  GLUCOSE 115* -- 150*  BUN 11 -- 16  CREATININE 0.73 0.80 --  CALCIUM 8.8 -- 8.9   CBG (last 3)   Basename 12/01/11  0650 11/30/11 2221  GLUCAP 86 143*   BP Readings from Last 3 Encounters:  12/01/11 115/79   Studies/Results: Dg Tibia/fibula Left  11/30/2011  *RADIOLOGY REPORT*  Clinical Data: Infection  LEFT TIBIA AND FIBULA - 2 VIEW  Comparison: None.  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  No acute fracture.  No dislocation.  Osteopenia.  No destructive bone lesion.  IMPRESSION: Chronic changes.  No active bony pathology.  Original Report Authenticated By: Donavan Burnet, M.D.   Dg Tibia/fibula Right  11/30/2011  *RADIOLOGY REPORT*  Clinical Data: Legs worse  RIGHT TIBIA AND FIBULA - 2 VIEW  Comparison: None.  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  There is marked irregularity of the medial compartment.  No obvious lytic bone lesion.  No acute fracture and no dislocation.  Diffuse soft tissue swelling is noted.  Multiple soft tissue calcifications in the leg likely represent phleboliths. Osteopenia.  IMPRESSION: Severe degenerative change of the knee.  No acute bony pathology.  Original Report Authenticated By: Donavan Burnet, M.D.    Medications: I have reviewed the patient's current medications.  Assessment/Plan:  Multiple ulcerations involving the anterior lower extremities. These are dry and do not appear to be actively infected. She seems fairly comfortable. A wound care consult has been requested. We'll initiate wound care today and hopefully will be ready for discharge tomorrow with assistance from home health care. Diabetes well controlled Hypertension stable Diabetic  peripheral neuropathy Peripheral arterial vascular disease    LOS: 1 day   [name] 12/01/2011, 11:04 AM

## 2011-12-02 LAB — GLUCOSE, CAPILLARY
Glucose-Capillary: 106 mg/dL — ABNORMAL HIGH (ref 70–99)
Glucose-Capillary: 129 mg/dL — ABNORMAL HIGH (ref 70–99)

## 2011-12-02 MED ORDER — HYDROCODONE-ACETAMINOPHEN 5-500 MG PO TABS: 1.0000 | ORAL_TABLET | Freq: Four times a day (QID) | ORAL | Status: AC | PRN

## 2011-12-02 MED ORDER — TRIAMCINOLONE ACETONIDE 0.025 % EX OINT: TOPICAL_OINTMENT | Freq: Two times a day (BID) | CUTANEOUS | Status: DC

## 2011-12-02 NOTE — Discharge Summary (Signed)
Patient ID: Cataleia Gade MRN: [id number] DOB/AGE: March 01, 1931 77 y.o. 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      triamcinolone 0.025 % ointment   Commonly known as: KENALOG   Apply topically 2 (two) times daily.         CONTINUE taking these medications         ALPRAZolam 0.25 MG tablet   Commonly known as: XANAX      aspirin 81 MG chewable tablet      calcium carbonate 600 MG Tabs   Commonly known as: OS-CAL      cloNIDine 0.3 MG tablet   Commonly known as: CATAPRES      gabapentin 300 MG capsule   Commonly known as: NEURONTIN      lisinopril 20 MG tablet   Commonly known as: PRINIVIL,ZESTRIL      metFORMIN 1000 MG tablet   Commonly known as: GLUCOPHAGE      metoprolol 100 MG 24 hr tablet   Commonly known as: TOPROL-XL      nisoldipine 34 MG 24 hr tablet   Commonly known as: SULAR      omeprazole 40 MG capsule   Commonly known as: PRILOSEC      traMADol 50 MG tablet   Commonly known as: ULTRAM         STOP taking these medications         losartan-hydrochlorothiazide 100-25 MG per tablet          Where to get your medications    These are the prescriptions that you need to pick up.   You may get these medications from any pharmacy.         HYDROcodone-acetaminophen 5-500 MG per tablet   triamcinolone 0.025 % ointment            Discharged Condition:  stable    Consults:  wound care  Significant Diagnostic Studies: Dg Tibia/fibula Left  11/30/2011  *RADIOLOGY REPORT*  Clinical Data: Infection  LEFT TIBIA AND FIBULA - 2 VIEW  Comparison: None.  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  No acute fracture.  No dislocation.  Osteopenia.  No destructive bone lesion.  IMPRESSION: Chronic changes.  No active bony pathology.  Original Report Authenticated By: Donavan Burnet, M.D.   Dg Tibia/fibula Right  11/30/2011  *RADIOLOGY REPORT*  Clinical Data: Legs worse  RIGHT TIBIA AND FIBULA - 2 VIEW  Comparison: None.  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  There is marked irregularity of the medial compartment.  No obvious lytic bone lesion.  No acute fracture and no dislocation.  Diffuse soft tissue swelling is noted.  Multiple soft tissue calcifications in the leg likely represent phleboliths. Osteopenia.  IMPRESSION: Severe degenerative change of the knee.  No acute bony pathology.  Original Report Authenticated By: Donavan Burnet, M.D.    Lab Results: Results for orders placed  during the hospital encounter of 11/30/11 (from the past 48 hour(s))  CBC     Status: Abnormal   Collection Time   11/30/11 11:40 AM      Component Value Range Comment   WBC 8.0  4.0 - 10.5 (K/uL)    RBC 4.00  3.87 - 5.11 (MIL/uL)    Hemoglobin 10.2 (*) 12.0 - 15.0 (g/dL)    HCT 19.1 (*) 47.8 - 46.0 (%)    MCV 78.5  78.0 - 100.0 (fL)    MCH 25.5 (*) 26.0 - 34.0 (pg)    MCHC 32.5  30.0 - 36.0 (g/dL)    RDW 29.5  62.1 - 30.8 (%)    Platelets 294  150 - 400 (K/uL)   DIFFERENTIAL     Status: Abnormal   Collection Time   11/30/11 11:40 AM      Component Value Range Comment   Neutrophils Relative 59  43 - 77 (%)    Neutro Abs 4.7  1.7 - 7.7 (K/uL)    Lymphocytes Relative 26  12 - 46 (%)    Lymphs Abs 2.1  0.7 - 4.0 (K/uL)    Monocytes Relative 13 (*) 3 - 12 (%)    Monocytes Absolute 1.0  0.1 - 1.0 (K/uL)     Eosinophils Relative 2  0 - 5 (%)    Eosinophils Absolute 0.1  0.0 - 0.7 (K/uL)    Basophils Relative 0  0 - 1 (%)    Basophils Absolute 0.0  0.0 - 0.1 (K/uL)   COMPREHENSIVE METABOLIC PANEL     Status: Abnormal   Collection Time   11/30/11 11:40 AM      Component Value Range Comment   Sodium 129 (*) 135 - 145 (mEq/L)    Potassium 4.0  3.5 - 5.1 (mEq/L)    Chloride 92 (*) 96 - 112 (mEq/L)    CO2 27  19 - 32 (mEq/L)    Glucose, Bld 150 (*) 70 - 99 (mg/dL)    BUN 16  6 - 23 (mg/dL)    Creatinine, Ser 6.57  0.50 - 1.10 (mg/dL)    Calcium 8.9  8.4 - 10.5 (mg/dL)    Total Protein 6.8  6.0 - 8.3 (g/dL)    Albumin 3.0 (*) 3.5 - 5.2 (g/dL)    AST 12  0 - 37 (U/L)    ALT 7  0 - 35 (U/L)    Alkaline Phosphatase 53  39 - 117 (U/L)    Total Bilirubin 0.5  0.3 - 1.2 (mg/dL)    GFR calc non Af Amer 65 (*) >90 (mL/min)    GFR calc Af Amer 75 (*) >90 (mL/min)   CBC     Status: Abnormal   Collection Time   11/30/11  7:24 PM      Component Value Range Comment   WBC 7.2  4.0 - 10.5 (K/uL)    RBC 4.21  3.87 - 5.11 (MIL/uL)    Hemoglobin 10.9 (*) 12.0 - 15.0 (g/dL)    HCT 84.6 (*) 96.2 - 46.0 (%)    MCV 77.9 (*) 78.0 - 100.0 (fL)    MCH 25.9 (*) 26.0 - 34.0 (pg)    MCHC 33.2  30.0 - 36.0 (g/dL)    RDW 95.2  84.1 - 32.4 (%)    Platelets 320  150 - 400 (K/uL)   CREATININE, SERUM     Status: Abnormal   Collection Time   11/30/11  7:24 PM  Component Value Range Comment   Creatinine, Ser 0.80  0.50 - 1.10 (mg/dL)    GFR calc non Af Amer 69 (*) >90 (mL/min)    GFR calc Af Amer 80 (*) >90 (mL/min)   GLUCOSE, CAPILLARY     Status: Abnormal   Collection Time   11/30/11 10:21 PM      Component Value Range Comment   Glucose-Capillary 143 (*) 70 - 99 (mg/dL)   GLUCOSE, CAPILLARY     Status: Normal   Collection Time   12/01/11  6:50 AM      Component Value Range Comment   Glucose-Capillary 86  70 - 99 (mg/dL)   BASIC METABOLIC PANEL     Status: Abnormal   Collection Time   12/01/11  8:38 AM       Component Value Range Comment   Sodium 134 (*) 135 - 145 (mEq/L)    Potassium 3.9  3.5 - 5.1 (mEq/L)    Chloride 98  96 - 112 (mEq/L)    CO2 29  19 - 32 (mEq/L)    Glucose, Bld 115 (*) 70 - 99 (mg/dL)    BUN 11  6 - 23 (mg/dL)    Creatinine, Ser 1.61  0.50 - 1.10 (mg/dL)    Calcium 8.8  8.4 - 10.5 (mg/dL)    GFR calc non Af Amer 80 (*) >90 (mL/min)    GFR calc Af Amer >90  >90 (mL/min)   GLUCOSE, CAPILLARY     Status: Abnormal   Collection Time   12/01/11 11:57 AM      Component Value Range Comment   Glucose-Capillary 130 (*) 70 - 99 (mg/dL)   GLUCOSE, CAPILLARY     Status: Abnormal   Collection Time   12/01/11  4:43 PM      Component Value Range Comment   Glucose-Capillary 113 (*) 70 - 99 (mg/dL)   GLUCOSE, CAPILLARY     Status: Normal   Collection Time   12/01/11  9:59 PM      Component Value Range Comment   Glucose-Capillary 93  70 - 99 (mg/dL)   GLUCOSE, CAPILLARY     Status: Abnormal   Collection Time   12/02/11  6:37 AM      Component Value Range Comment   Glucose-Capillary 106 (*) 70 - 99 (mg/dL)    No results found for this or any previous visit (from the past 240 hour(s)).   Hospital Course:  77 year old patient who was admitted to the hospital for evaluation and treatment of worsening ulceration involving her anterior lower extremities she has a long history of diabetes, treated by peripheral neuropathy.  The ulcers had worsened as an outpatient and  Had become more confluent. There was some concern by the patient's PCP about worsening infection and requiring parenteral antibiotic therapy.   The patient was admitted to the hospital where the ulcerations were felt to be noninfectious the ulcers were dry crusted over and had large areas of confluence measuring up to 14-16 cm involving the left anterior upper leg. There is minimal to moderate discomfort associated with the ulceration to the hospital. She remained afebrile and the white count was normal  Discharge  Exam:  at the time of discharge there is little clinical change in the dry crusted superficial ulcerations.  Blood pressure 146/77, pulse 57, temperature 97.8 F (36.6 C), temperature source Oral, resp. rate 16, height 5\' 2"  (1.575 m), weight 74.844 kg (165 lb), SpO2 94.00%.   Disposition:  the patient be  discharged on analgesics as well as triamcinolone cream she will follow up with her primary care provider and was also given several names of local dermatologist for further evaluation. The patient may be considered for a  punch biopsy to further delineate the precise diagnosis as an outpatient.   Discharge Orders    Future Orders Please Complete By Expires   Diet - low sodium heart healthy      Increase activity slowly      Discharge instructions      Comments:   Followup with primary care provider and dermatology referral as discussed   Discharge wound care:      Comments:   Apply triamcinolone ointment twice daily      Follow-up Information    Follow up with [name]..   Contact information:   [address] [place] [address] [phone]       Follow up with [name].. (Followup dermatology )    Contact information:   [address] [place] [address] [phone]          Signed: Rogelia Boga 12/02/2011, 9:21 AM

## 2011-12-02 NOTE — Consult Note (Signed)
WOC consult Note Reason for Consult: Bilateral non-draining ulcerations on lower extremities; similar presentation of ulceration at base of left thumb Wound type: Etiology not known. Differential diagnosis likely includes autoimmune, infectious and/or circulatory origins Wound bed: Dry, crusted, necrotic on Presentation:  Many diffuse circular, crusted lesions on bilateral lower extremities and left thumb; confluent lesions have formed on right LE.  Patient reports pain only on left LE, 2-3 on pain scale this morning. Drainage (amount, consistency, odor)  None Dressing procedure/placement/frequency: None indicated.    Patient states that she has had this condition for approximately 5 months; her PCP prescribed a topical cream (not known) that did not resolve nor change the ulcerations despite regular (three times daily) applications for several months. Diagnosis and treatment of this condition exceeds scope of WOC nursing practice, however I recommend referral to Dermatology post discharge for definitive diagnosis (which may include punch biopsy) and topical care.  I will not follow.  Please re-consult if needed. Thanks, Ladona Mow, MSN, RN, [facility], CWOCN [phone])

## 2011-12-19 ENCOUNTER — Encounter (HOSPITAL_BASED_OUTPATIENT_CLINIC_OR_DEPARTMENT_OTHER): Payer: Medicare Other | Attending: Plastic Surgery

## 2011-12-19 DIAGNOSIS — L97809 Non-pressure chronic ulcer of other part of unspecified lower leg with unspecified severity: Secondary | ICD-10-CM | POA: Insufficient documentation

## 2011-12-20 NOTE — Progress Notes (Signed)
[facility]  NAMESEJAL, COFIELD              ACCOUNT NO.:  [id number]  MEDICAL RECORD NO.:  [id number]      DATE OF BIRTH:  Apr 14, 1931  PHYSICIAN:  Wayland Denis, DO       VISIT DATE:  12/19/2011                                  OFFICE VISIT   CHIEF COMPLAINT:  Bilateral lower extremity ulceration.  HISTORY OF PRESENT ILLNESS:  Ms. Deason is a 78 year old black female, who is here for evaluation of bilateral lower extremity ulcerations. She states that she has had ulcerations of the right lower extremity, since August 2012, and on the left since December of 2012.  She was seeing a dermatologist at the [facility].  She had numerous necrotic ulcers that ranged from 1-18 cm on both of her lower extremities any where from the 20-30 different areas of ulceration.  Biopsy was done with shave.  The differential diagnosis, necrobiosis lipoidica, vasculitis, and embolic ischemic changes. She has multiple medical conditions for which she sees her primary care physician.  She has been using a triple antibiotic ointment on the area without much change.  She says that they do hurt and nothing seems to be able to make them better.  She had hemoglobin A1c checked about a month or 2 ago, does not remember what it was.  She checks her blood sugars daily and says that they are usually in good range, but did not give any specific.  She was recently admitted for bilateral lower extremity ulcerations with worsening of the condition.  She was given antibiotics and discharged due to new infectious underlying issue noted with triamcinolone cream as treatment to be applied twice a day.  PAST MEDICAL HISTORY:  Diabetes, hypertension, anxiety, gastroesophageal reflux.  MEDICATIONS:  Xanax, aspirin, calcium, clonidine, Neurontin, lisinopril, metformin, metoprolol, Xolair, omeprazole, tramadol.  No known drug allergies.  PAST SURGICAL HISTORY:  Tubal  ligation.  REVIEW OF SYSTEMS:  Complains of lower extremity pain.  Denies any significant change in her weight, blood in her urine or stool, significant change in her vision, chest pain, shortness of breath.  SOCIAL HISTORY:  She lives at home and has some family support.  She denies smoking.  PHYSICAL EXAMINATION:  GENERAL:  The patient is alert, oriented.  She seems to be a good historian. HEENT:  She has glasses on.  Her pupils are equal.  Extraocular muscles are intact.  She does not have any cervical lymphadenopathy. EXTREMITIES:  Her pulses on her upper extremities are strong, weak in her lower extremities, but regular. LUNGS:  Her breathing is unlabored. HEART:  Regular. ABDOMEN:  Soft. SKIN:  She has multiple ulcerations with black eschar in the lower extremity that likely involved skin and subcutaneous tissue.  There is no oozing or malodor in any of them.  Her pulses are weak.  She has some varicosities and some swelling, some mild peri-wound redness of the skin.  The x-ray of the lower extremities done in November 30, 2011, showed severe tricompartmental osteoarthritis of the knees.  No obvious lytic bone lesions.  No dislocation.  Osteopenia was present.  There was no destructive bone lesion.  There was concern of phlebolith, no other pathology was indicated. We will also check a prealbumin.  We  will try and get the results of her hemoglobin A1c.  Recommend stopping the triamcinolone and switching to Silvadene bilaterally, she may shower, we will have vascular studies done to evaluate blood flow to the area with a prealbumin as well.  Recommend multivitamin, vitamin C, zinc elevation and we will see her back in a week.     Wayland Denis, DO     CS/MEDQ  D:  12/19/2011  T:  12/20/2011  Job:  [id number]

## 2011-12-21 ENCOUNTER — Ambulatory Visit (INDEPENDENT_AMBULATORY_CARE_PROVIDER_SITE_OTHER): Payer: Medicare Other | Admitting: *Deleted

## 2011-12-21 ENCOUNTER — Encounter (INDEPENDENT_AMBULATORY_CARE_PROVIDER_SITE_OTHER): Payer: Medicare Other | Admitting: *Deleted

## 2011-12-21 DIAGNOSIS — L97209 Non-pressure chronic ulcer of unspecified calf with unspecified severity: Secondary | ICD-10-CM

## 2011-12-21 DIAGNOSIS — L97909 Non-pressure chronic ulcer of unspecified part of unspecified lower leg with unspecified severity: Secondary | ICD-10-CM

## 2011-12-27 NOTE — Progress Notes (Signed)
[facility]  NAME:  [name], [name]                   ACCOUNT NO.:  MEDICAL RECORD NO.:  [id number]      DATE OF BIRTH:  [birth year]  PHYSICIAN:  Wayland Denis, DO            VISIT DATE:                                  OFFICE VISIT   [name] is a 78 year old female who is here for followup on bilateral lower extremity ulcers.  She has been using Silvadene with some improvement of the areas.  We also ordered a prealbumin hemoglobin A1c from her old studies and vascular studies.  The vascular studies were somewhat inconclusive due to the wounds and the inability to obtain the distal studies.  There was no evidence of DVT or suprapopliteal DVT. There is venous insufficiency in the right lower extremity.  So, we will need to await to get the studies distally.  She still has quite a bit of tenderness, but no progressive worsening of the wound.  There has been no change in her medications or social history.  PHYSICAL EXAMINATION:  GENERAL:  She is alert, oriented, cooperative, not in any acute distress.  She is pleasant. HEENT:  Pupils are equal.  Extraocular muscles are intact. NECK:  No cervical lymphadenopathy. LUNGS:  Breathing is unlabored. HEART:  Regular.  We did quite a bit of debriding of all of the eschars and got down to superficial and deep fascia on many of the areas.  They were extremely tender, so we were unable to go as deep as I had hoped, but certainly an improvement from last time.  We will now switch to Santyl, Hydrogel, Kerlix, netting with every other day dressing changes and see her back in a week and re-evaluate at that time.     Wayland Denis, DO     CS/MEDQ  D:  12/26/2011  T:  12/27/2011  Job:  [id number]

## 2012-01-02 NOTE — Progress Notes (Signed)
[facility]  NAME:  [name], [name]                   ACCOUNT NO.:  MEDICAL RECORD NO.:  [id number]      DATE OF BIRTH:  [birth year]  PHYSICIAN:  Wayland Denis, DO       VISIT DATE:  01/02/2012                                  OFFICE VISIT   [name] is a 78 year old female with bilateral lower extremity ulcers.  She has been using Santyl with Hydrogel over the past week. There is a little bit of improvement in the smaller ulcers.  The larger ones still have fibrous necrotic tissue and malodor.  There is no overt drainage.  This is likely down to muscle.  There has been no change in her medications or social history.  On exam, she is alert and oriented.  She is uncomfortable with any palpation of the legs, but not in any acute distress.  Pupils are equal. Extraocular muscles are intact.  No cervical lymphadenopathy.  Her breathing is unlabored.  Her heart rate is regular.  Her abdomen is soft.  No cervical lymphadenopathy.  The wound is noted in the nurse's notes and there is mild improvement. We did debride quite a bit today and we did discuss the possibility of going to the OR if we do not see some pretty quick changes in the next week or two.  We will continue with Santyl, Hydrogel, Adaptic, Kerlix, and netting.  Follow up in 1 week.     Wayland Denis, DO     CS/MEDQ  D:  01/02/2012  T:  01/02/2012  Job:  [id number]

## 2012-01-03 NOTE — Procedures (Unsigned)
DUPLEX DEEP VENOUS EXAM - LOWER EXTREMITY  INDICATION:  Extensive bilateral lower extremity ulceration with staph infection  HISTORY:  Edema:  Yes Trauma/Surgery:  No Pain:  Yes PE:  No Previous DVT:  Left lower extremity Anticoagulants:  Unknown Other:  DUPLEX EXAM:               CFV   SFV   PopV  PTV    GSV               R  L  R  L  R  L  R   L  R  L Thrombosis    0  0  0  0               0  0 Spontaneous   +  +  +  +               +  + Phasic        +  +  +  +               +  + Augmentation  +  +  +  +               +  + Compressible  +  +  +  +               +  + Competent     0  +  0  +               0  +  Legend:  + - yes  o - no  p - partial  D - decreased  IMPRESSION: 1. No evidence of deep venous thrombosis in the suprapopliteal veins. 2. There is deep and superficial venous insufficiency observed in the     right lower extremity. 3. The bilateral popliteal and infrapopliteal veins could not be     evaluated due to wound dressing.   _____________________________ [name], M.D.  LT/MEDQ  D:  12/21/2011  T:  12/21/2011  Job:  [id number]

## 2012-01-09 ENCOUNTER — Encounter (HOSPITAL_BASED_OUTPATIENT_CLINIC_OR_DEPARTMENT_OTHER): Payer: Medicare Other | Attending: Plastic Surgery

## 2012-01-09 DIAGNOSIS — L97809 Non-pressure chronic ulcer of other part of unspecified lower leg with unspecified severity: Secondary | ICD-10-CM | POA: Insufficient documentation

## 2012-01-10 NOTE — Progress Notes (Signed)
[facility]  NAMETREZURE, CRONK              ACCOUNT NO.:  [id number]  MEDICAL RECORD NO.:  [id number]      DATE OF BIRTH:  04-22-1931  PHYSICIAN:  Wayland Denis, DO       VISIT DATE:  01/09/2012                                  OFFICE VISIT   [name] is a 78 year old female who is here for follow up on her bilateral lower extremities venous ulcers.  She was debrided last week and it has responded with some improvement in the granulation tissue. She does have a portion of the left tibia that is exposed and has dried out.  There has been no change in her medications.  She has been using the Santyl and Hydrogel.  She is at home.  No change in her social history.  She lives here in [place].  She does not smoke.  On exam, she is alert, oriented, cooperative, in no acute distress, but very tender in that area.  Her pupils are equal.  Glasses are in place. Extraocular muscles are intact.  No cervical lymphadenopathy.  Her breathing is unlabored.  Her heart is regular.  Her abdomen is soft.  The wounds are improving slightly and no drainage noted.  She still has a very large wound on the left with small satellite areas surrounding and several small lesions on the right.  We debrided down to bone on the left but could not go close to the skin edge because of the pain.  I think she would benefit from a formal debridement in the OR with placement of VAC.  She may need some __________ placed over the bone. She is not a good candidate for free flap and rotational flap would have difficulty reaching that area of the bone that is exposed, so we will see her back in a week.  On the left lower extremity, the larger wound is 7 x 8.8 x 0.3 cm, the right is 3.1 x 9.4 x 8.2.  Due to her comorbidities, this will take an extended period of time to heal with local care and not instituting the [facility] and debridement in particular because of her diabetes.  I think she would be  on the [facility] for a couple of months.  If she responds well to the debridement, then we would be able to move towards grafting.     Wayland Denis, DO     CS/MEDQ  D:  01/09/2012  T:  01/09/2012  Job:  [id number]

## 2012-01-16 ENCOUNTER — Encounter (HOSPITAL_BASED_OUTPATIENT_CLINIC_OR_DEPARTMENT_OTHER): Payer: Self-pay | Admitting: *Deleted

## 2012-01-16 NOTE — Progress Notes (Signed)
To come in for ekg-bmet-not done in hosp 12/12 Called dr berry for notes-not seen him in 1 yr-bp control To bring vacs-dos per pt /dr sanger

## 2012-01-17 ENCOUNTER — Encounter (HOSPITAL_BASED_OUTPATIENT_CLINIC_OR_DEPARTMENT_OTHER)
Admission: RE | Admit: 2012-01-17 | Discharge: 2012-01-17 | Disposition: A | Discharge status: Home / Self Care | Payer: Medicare Other | Source: Ambulatory Visit | Attending: Plastic Surgery | Admitting: Plastic Surgery

## 2012-01-17 NOTE — Progress Notes (Signed)
[facility]  NAME:  [name], [name]                   ACCOUNT NO.:  MEDICAL RECORD NO.:  [id number]      DATE OF BIRTH:  [birth year]  PHYSICIAN:  Wayland Denis, DO       VISIT DATE:  01/16/2012                                  OFFICE VISIT   [name] is a 78 year old female, who is here for followup on her bilateral lower extremity ulcers.  She is doing better.  There is less malodor and much more granulation tissue than previously.  She still has quite a bit of fibrous tissue and it looks like she has approximately 6 cm x 2 cm of bone exposed in the left tibial area anteriorly.  The cortex is still covered, but if I debride the fibrous tissues likely going to be periosteum exposed.  She has been using Santyl, which seems to be helping.  There has been no change in her medications.  Review of systems is negative.  Her social history is unchanged.  On exam, she is alert and oriented.  She is doing better today.  Pupils are equal.  Extraocular muscles are intact.  Glasses are in place.  No cervical lymphadenopathy.  Breathing is unlabored.  Her heart rate is regular.  Her abdomen is soft.  The wounds are improving and noted in the nurse's note with sizes.  We did debride skin, soft tissue, fibrous tissue down to bone, and we will plan to go to the OR next week for further debridement and possible placement of ACell and Integra or VAC.  In the meantime, she is to continue with the Santyl.     Wayland Denis, DO     CS/MEDQ  D:  01/16/2012  T:  01/16/2012  Job:  [id number]

## 2012-01-18 ENCOUNTER — Other Ambulatory Visit: Payer: Self-pay

## 2012-01-18 ENCOUNTER — Encounter (HOSPITAL_BASED_OUTPATIENT_CLINIC_OR_DEPARTMENT_OTHER)
Admission: RE | Admit: 2012-01-18 | Discharge: 2012-01-18 | Disposition: A | Discharge status: Home / Self Care | Payer: Medicare Other | Source: Ambulatory Visit | Attending: Plastic Surgery | Admitting: Plastic Surgery

## 2012-01-18 LAB — BASIC METABOLIC PANEL
Chloride: 99 mEq/L (ref 96–112)
Creatinine, Ser: 1.17 mg/dL — ABNORMAL HIGH (ref 0.50–1.10)
GFR calc Af Amer: 50 mL/min — ABNORMAL LOW (ref >90)

## 2012-01-18 NOTE — Pre-Procedure Instructions (Signed)
Spoke with patient's daughter-Deborah Raul Del. She will be coming with patient DOS. Clarified time of surgery and arrival time due to change in scheduled time of surgery, as well as change to NPO after MN status. To arrive at 0845 for surgery to begin @ 1015 on 01/21/12. Pt to come in today for labwork. Daughter states she will inform patient of change in surgery time and NPO status. Reviewed medicines that patient is to take AM of surgery with daughter.

## 2012-01-21 ENCOUNTER — Encounter (HOSPITAL_BASED_OUTPATIENT_CLINIC_OR_DEPARTMENT_OTHER): Payer: Self-pay | Admitting: Anesthesiology

## 2012-01-21 ENCOUNTER — Ambulatory Visit (HOSPITAL_BASED_OUTPATIENT_CLINIC_OR_DEPARTMENT_OTHER): Payer: Medicare Other | Admitting: Anesthesiology

## 2012-01-21 ENCOUNTER — Encounter (HOSPITAL_BASED_OUTPATIENT_CLINIC_OR_DEPARTMENT_OTHER)
Admission: RE | Disposition: A | Discharge status: Home / Self Care | Payer: Self-pay | Source: Ambulatory Visit | Attending: Plastic Surgery

## 2012-01-21 ENCOUNTER — Ambulatory Visit (HOSPITAL_BASED_OUTPATIENT_CLINIC_OR_DEPARTMENT_OTHER)
Admission: RE | Admit: 2012-01-21 | Discharge: 2012-01-21 | Disposition: A | Discharge status: Home / Self Care | Payer: Medicare Other | Source: Ambulatory Visit | Attending: Plastic Surgery | Admitting: Plastic Surgery

## 2012-01-21 ENCOUNTER — Other Ambulatory Visit: Payer: Self-pay | Admitting: Plastic Surgery

## 2012-01-21 ENCOUNTER — Encounter (HOSPITAL_BASED_OUTPATIENT_CLINIC_OR_DEPARTMENT_OTHER): Payer: Self-pay | Admitting: *Deleted

## 2012-01-21 DIAGNOSIS — L97909 Non-pressure chronic ulcer of unspecified part of unspecified lower leg with unspecified severity: Secondary | ICD-10-CM | POA: Insufficient documentation

## 2012-01-21 DIAGNOSIS — Z01810 Encounter for preprocedural cardiovascular examination: Secondary | ICD-10-CM | POA: Insufficient documentation

## 2012-01-21 DIAGNOSIS — Z01812 Encounter for preprocedural laboratory examination: Secondary | ICD-10-CM | POA: Insufficient documentation

## 2012-01-21 DIAGNOSIS — K219 Gastro-esophageal reflux disease without esophagitis: Secondary | ICD-10-CM | POA: Insufficient documentation

## 2012-01-21 DIAGNOSIS — I10 Essential (primary) hypertension: Secondary | ICD-10-CM | POA: Insufficient documentation

## 2012-01-21 DIAGNOSIS — E785 Hyperlipidemia, unspecified: Secondary | ICD-10-CM | POA: Insufficient documentation

## 2012-01-21 DIAGNOSIS — E119 Type 2 diabetes mellitus without complications: Secondary | ICD-10-CM | POA: Insufficient documentation

## 2012-01-21 HISTORY — DX: Anxiety disorder, unspecified: F41.9

## 2012-01-21 HISTORY — DX: Myoneural disorder, unspecified: G70.9

## 2012-01-21 HISTORY — PX: INCISION AND DRAINAGE OF WOUND: SHX1803

## 2012-01-21 HISTORY — DX: Peripheral vascular disease, unspecified: I73.9

## 2012-01-21 HISTORY — DX: Gastro-esophageal reflux disease without esophagitis: K21.9

## 2012-01-21 HISTORY — DX: Unspecified osteoarthritis, unspecified site: M19.90

## 2012-01-21 LAB — GLUCOSE, CAPILLARY: Glucose-Capillary: 85 mg/dL (ref 70–99)

## 2012-01-21 SURGERY — IRRIGATION AND DEBRIDEMENT WOUND
Anesthesia: General | Site: Leg Lower | Laterality: Bilateral | Wound class: Contaminated

## 2012-01-21 MED ORDER — PROPOFOL 10 MG/ML IV EMUL
INTRAVENOUS | Status: DC | PRN
  Administered 2012-01-21: 200 mg via INTRAVENOUS

## 2012-01-21 MED ORDER — ONDANSETRON HCL 4 MG/2ML IJ SOLN: 4.0000 mg | Freq: Once | INTRAMUSCULAR | Status: DC | PRN

## 2012-01-21 MED ORDER — FENTANYL CITRATE 0.05 MG/ML IJ SOLN
INTRAMUSCULAR | Status: DC | PRN
  Administered 2012-01-21 (×2): 25 ug via INTRAVENOUS
  Administered 2012-01-21: 50 ug via INTRAVENOUS

## 2012-01-21 MED ORDER — CEFAZOLIN SODIUM 1-5 GM-% IV SOLN
1.0000 g | INTRAVENOUS | Status: AC
  Administered 2012-01-21: 1 g via INTRAVENOUS

## 2012-01-21 MED ORDER — HYDROMORPHONE HCL PF 1 MG/ML IJ SOLN
0.2500 mg | INTRAMUSCULAR | Status: DC | PRN
  Administered 2012-01-21: 0.25 mg via INTRAVENOUS

## 2012-01-21 MED ORDER — MORPHINE SULFATE 2 MG/ML IJ SOLN: 0.0500 mg/kg | INTRAMUSCULAR | Status: DC | PRN

## 2012-01-21 MED ORDER — LACTATED RINGERS IV SOLN
INTRAVENOUS | Status: DC
  Administered 2012-01-21 (×2): via INTRAVENOUS

## 2012-01-21 MED ORDER — MEPERIDINE HCL 25 MG/ML IJ SOLN: 6.2500 mg | INTRAMUSCULAR | Status: DC | PRN

## 2012-01-21 MED ORDER — SODIUM CHLORIDE 0.9 % IR SOLN
Status: DC | PRN
  Administered 2012-01-21: 12:00:00

## 2012-01-21 MED ORDER — SODIUM CHLORIDE 0.9 % IR SOLN
Status: DC | PRN
  Administered 2012-01-21: 3000 mL

## 2012-01-21 MED ORDER — LIDOCAINE HCL (CARDIAC) 20 MG/ML IV SOLN
INTRAVENOUS | Status: DC | PRN
  Administered 2012-01-21: 75 mg via INTRAVENOUS

## 2012-01-21 SURGICAL SUPPLY — 94 items
APL SKNCLS STERI-STRIP NONHPOA (GAUZE/BANDAGES/DRESSINGS) ×1
BAG DECANTER FOR FLEXI CONT (MISCELLANEOUS) ×1 IMPLANT
BANDAGE ACE 4 STERILE (GAUZE/BANDAGES/DRESSINGS) ×1 IMPLANT
BANDAGE ELASTIC 3 VELCRO ST LF (GAUZE/BANDAGES/DRESSINGS) IMPLANT
BANDAGE ELASTIC 4 VELCRO ST LF (GAUZE/BANDAGES/DRESSINGS) IMPLANT
BANDAGE ELASTIC 6 VELCRO ST LF (GAUZE/BANDAGES/DRESSINGS) IMPLANT
BANDAGE GAUZE ELAST BULKY 4 IN (GAUZE/BANDAGES/DRESSINGS) ×1 IMPLANT
BENZOIN TINCTURE PRP APPL 2/3 (GAUZE/BANDAGES/DRESSINGS) ×1 IMPLANT
BLADE MINI RND TIP GREEN BEAV (BLADE) IMPLANT
BLADE SURG 10 STRL SS (BLADE) ×1 IMPLANT
BLADE SURG 15 STRL LF DISP TIS (BLADE) ×1 IMPLANT
BLADE SURG 15 STRL SS (BLADE) ×2
BNDG CMPR 9X4 STRL LF SNTH (GAUZE/BANDAGES/DRESSINGS)
BNDG COHESIVE 1X5 TAN STRL LF (GAUZE/BANDAGES/DRESSINGS) IMPLANT
BNDG COHESIVE 4X5 TAN STRL (GAUZE/BANDAGES/DRESSINGS) ×2 IMPLANT
BNDG ESMARK 4X9 LF (GAUZE/BANDAGES/DRESSINGS) IMPLANT
CANISTER OMNI JUG 16 LITER (MISCELLANEOUS) ×1 IMPLANT
CANISTER SUCTION 1200CC (MISCELLANEOUS) IMPLANT
CANISTER SUCTION 2500CC (MISCELLANEOUS) IMPLANT
CHLORAPREP W/TINT 26ML (MISCELLANEOUS) ×1 IMPLANT
CLOTH BEACON ORANGE TIMEOUT ST (SAFETY) ×2 IMPLANT
CORDS BIPOLAR (ELECTRODE) IMPLANT
COVER MAYO STAND STRL (DRAPES) ×2 IMPLANT
COVER TABLE BACK 60X90 (DRAPES) ×2 IMPLANT
DECANTER SPIKE VIAL GLASS SM (MISCELLANEOUS) IMPLANT
DRAIN PENROSE 1/2X12 LTX STRL (WOUND CARE) IMPLANT
DRAPE EXTREMITY T 121X128X90 (DRAPE) ×2 IMPLANT
DRAPE INCISE IOBAN 66X45 STRL (DRAPES) ×1 IMPLANT
DRSG ADAPTIC 3X8 NADH LF (GAUZE/BANDAGES/DRESSINGS) ×1 IMPLANT
DRSG EMULSION OIL 3X3 NADH (GAUZE/BANDAGES/DRESSINGS) ×1 IMPLANT
DRSG PAD ABDOMINAL 8X10 ST (GAUZE/BANDAGES/DRESSINGS) IMPLANT
DURA STEPPER MED (CAST SUPPLIES) ×1 IMPLANT
ELECT COATED BLADE 2.86 ST (ELECTRODE) ×1 IMPLANT
ELECT NDL TIP 2.8 STRL (NEEDLE) IMPLANT
ELECT NEEDLE TIP 2.8 STRL (NEEDLE) IMPLANT
ELECT REM PT RETURN 9FT ADLT (ELECTROSURGICAL) ×2
ELECTRODE REM PT RTRN 9FT ADLT (ELECTROSURGICAL) ×1 IMPLANT
GAUZE SPONGE 4X4 12PLY STRL LF (GAUZE/BANDAGES/DRESSINGS) IMPLANT
GAUZE XEROFORM 1X8 LF (GAUZE/BANDAGES/DRESSINGS) IMPLANT
GAUZE XEROFORM 5X9 LF (GAUZE/BANDAGES/DRESSINGS) IMPLANT
GLOVE BIO SURGEON STRL SZ 6.5 (GLOVE) ×4 IMPLANT
GLOVE SKINSENSE NS SZ7.0 (GLOVE) ×1
GLOVE SKINSENSE STRL SZ7.0 (GLOVE) IMPLANT
GOWN PREVENTION PLUS XLARGE (GOWN DISPOSABLE) ×3 IMPLANT
HANDPIECE INTERPULSE COAX TIP (DISPOSABLE) ×2
IV NS IRRIG 3000ML ARTHROMATIC (IV SOLUTION) ×1 IMPLANT
MATRISTERN SURG MATRIX RS 5X5 (Tissue) ×1 IMPLANT
MATRIX SURGICAL PSM 10X15CM (Tissue) ×1 IMPLANT
MICROMATRIX 500MG (Tissue) ×4 IMPLANT
NDL HYPO 30GX1 BEV (NEEDLE) IMPLANT
NEEDLE 27GAX1X1/2 (NEEDLE) IMPLANT
NEEDLE HYPO 30GX1 BEV (NEEDLE) IMPLANT
NS IRRIG 1000ML POUR BTL (IV SOLUTION) ×2 IMPLANT
PACK BASIN DAY SURGERY FS (CUSTOM PROCEDURE TRAY) ×2 IMPLANT
PADDING CAST ABS 3INX4YD NS (CAST SUPPLIES)
PADDING CAST ABS 4INX4YD NS (CAST SUPPLIES)
PADDING CAST ABS COTTON 3X4 (CAST SUPPLIES) IMPLANT
PADDING CAST ABS COTTON 4X4 ST (CAST SUPPLIES) IMPLANT
PENCIL BUTTON HOLSTER BLD 10FT (ELECTRODE) ×1 IMPLANT
SET HNDPC FAN SPRY TIP SCT (DISPOSABLE) IMPLANT
SHEET MEDIUM DRAPE 40X70 STRL (DRAPES) IMPLANT
SLEEVE SCD COMPRESS KNEE MED (MISCELLANEOUS) IMPLANT
SOLUTION PARTIC MCRMTRX 500MG (Tissue) IMPLANT
SPLINT PLASTER CAST XFAST 3X15 (CAST SUPPLIES) IMPLANT
SPLINT PLASTER XTRA FASTSET 3X (CAST SUPPLIES)
SPONGE GAUZE 4X4 12PLY (GAUZE/BANDAGES/DRESSINGS) ×2 IMPLANT
SPONGE LAP 18X18 X RAY DECT (DISPOSABLE) ×1 IMPLANT
SPONGE LAP 4X18 X RAY DECT (DISPOSABLE) IMPLANT
STAPLER VISISTAT 35W (STAPLE) ×2 IMPLANT
STOCKINETTE 4X48 STRL (DRAPES) IMPLANT
STOCKINETTE 6  STRL (DRAPES)
STOCKINETTE 6 STRL (DRAPES) ×1 IMPLANT
STOCKINETTE IMPERVIOUS LG (DRAPES) ×2 IMPLANT
STRIP CLOSURE SKIN 1/2X4 (GAUZE/BANDAGES/DRESSINGS) IMPLANT
SUCTION FRAZIER TIP 10 FR DISP (SUCTIONS) IMPLANT
SUT ETHILON 3 0 PS 1 (SUTURE) IMPLANT
SUT ETHILON 4 0 P 3 18 (SUTURE) IMPLANT
SUT ETHILON 5 0 PS 2 18 (SUTURE) ×1 IMPLANT
SUT PROLENE 3 0 PS 2 (SUTURE) IMPLANT
SUT SILK 3 0 PS 1 (SUTURE) IMPLANT
SUT VIC AB 3-0 FS2 27 (SUTURE) IMPLANT
SUT VIC AB 5-0 P-3 18X BRD (SUTURE) IMPLANT
SUT VIC AB 5-0 P3 18 (SUTURE)
SUT VIC AB 5-0 PS2 18 (SUTURE) IMPLANT
SYR BULB 3OZ (MISCELLANEOUS) IMPLANT
SYR BULB IRRIGATION 50ML (SYRINGE) ×1 IMPLANT
SYR CONTROL 10ML LL (SYRINGE) ×1 IMPLANT
TAPE HYPAFIX 6X30 (GAUZE/BANDAGES/DRESSINGS) IMPLANT
TOWEL OR 17X24 6PK STRL BLUE (TOWEL DISPOSABLE) ×3 IMPLANT
TRAY DSU PREP LF (CUSTOM PROCEDURE TRAY) ×1 IMPLANT
TUBE CONNECTING 20X1/4 (TUBING) ×1 IMPLANT
UNDERPAD 30X30 INCONTINENT (UNDERPADS AND DIAPERS) ×2 IMPLANT
WATER STERILE IRR 1000ML POUR (IV SOLUTION) ×1 IMPLANT
YANKAUER SUCT BULB TIP NO VENT (SUCTIONS) ×1 IMPLANT

## 2012-01-21 NOTE — Discharge Instructions (Addendum)
Right leg - hydrogel on the adaptic every other day with kerlex and ace wrap Left leg - VAC at 50 mmHg continuous pressure - do not change   [facility]  [address] [place], [place] [address] [phone]   Post Anesthesia Home Care Instructions  Activity: Get plenty of rest for the remainder of the day. A responsible adult should stay with you for 24 hours following the procedure.  For the next 24 hours, DO NOT: -Drive a car -Advertising copywriter -Drink alcoholic beverages -Take any medication unless instructed by your physician -Make any legal decisions or sign important papers.  Meals: Start with liquid foods such as gelatin or soup. Progress to regular foods as tolerated. Avoid greasy, spicy, heavy foods. If nausea and/or vomiting occur, drink only clear liquids until the nausea and/or vomiting subsides. Call your physician if vomiting continues.  Special Instructions/Symptoms: Your throat may feel dry or sore from the anesthesia or the breathing tube placed in your throat during surgery. If this causes discomfort, gargle with warm salt water. The discomfort should disappear within 24 hours.

## 2012-01-21 NOTE — Anesthesia Preprocedure Evaluation (Signed)
Anesthesia Evaluation  Patient identified by MRN, date of birth, ID band Patient awake    Reviewed: Allergy & Precautions, H&P , NPO status , Patient's Chart, lab work & pertinent test results  Airway Mallampati: I TM Distance: >3 FB Neck ROM: Full    Dental  (+) Edentulous Upper and Edentulous Lower   Pulmonary  clear to auscultation        Cardiovascular Regular Normal    Neuro/Psych    GI/Hepatic   Endo/Other    Renal/GU      Musculoskeletal   Abdominal   Peds  Hematology   Anesthesia Other Findings   Reproductive/Obstetrics                           Anesthesia Physical Anesthesia Plan  ASA: III  Anesthesia Plan: General   Post-op Pain Management:    Induction: Intravenous  Airway Management Planned: LMA  Additional Equipment:   Intra-op Plan:   Post-operative Plan: Extubation in OR  Informed Consent: I have reviewed the patients History and Physical, chart, labs and discussed the procedure including the risks, benefits and alternatives for the proposed anesthesia with the patient or authorized representative who has indicated his/her understanding and acceptance.   Dental advisory given  Plan Discussed with: CRNA, Anesthesiologist and Surgeon  Anesthesia Plan Comments:         Anesthesia Quick Evaluation

## 2012-01-21 NOTE — H&P (Signed)
[name] is an 78 y.o. female.   Chief Complaint:  Bilateral lower extremity ulcers  HPI: Patient is a 78 yrs old bf here for surgical management of bilateral lower extremity ulcers.  They have been present for over a month and undergoing local care at the [facility] with serial debridments.  She presents for formal I&D in the OR with possible placement of Acell and VAC.   Past Medical History  Diagnosis Date  . Diabetes mellitus   . Hypertension   . Hyperlipemia   . Phlebitis 11/30/11    RLE  . Skin ulcer(s) 11/30/11    BLE; draining  . GERD (gastroesophageal reflux disease)   . Arthritis   . Anxiety   . Peripheral vascular disease   . Neuromuscular disorder     numbness feet    Past Surgical History  Procedure Date  . Abdominal hysterectomy   . Cataract extraction w/ intraocular lens  implant, bilateral 1990's  . Blood clot 1990's    RLE; "cut out"  . Eye surgery     History reviewed. No pertinent family history. Social History:  reports that she has never smoked. She has never used smokeless tobacco. She reports that she does not drink alcohol or use illicit drugs.  Allergies: No Known Allergies  Medications Prior to Admission  Medication Dose Route Frequency Provider Last Rate Last Dose  . ceFAZolin (ANCEF) IVPB 1 g/50 mL premix  1 g Intravenous 60 min Pre-Op Autry Prust Sanger, DO      . lactated ringers infusion   Intravenous Continuous Bedelia Person, MD 20 mL/hr at 01/21/12 0940     Medications Prior to Admission  Medication Sig Dispense Refill  . ALPRAZolam (XANAX) 0.25 MG tablet Take 0.25 mg by mouth daily.        Marland Kitchen aspirin 81 MG chewable tablet Chew 81 mg by mouth daily.        . calcium carbonate (OS-CAL) 600 MG TABS Take 600 mg by mouth daily.        . cloNIDine (CATAPRES) 0.3 MG tablet Take 0.3 mg by mouth 3 (three) times daily.        Marland Kitchen gabapentin (NEURONTIN) 300 MG capsule Take 300 mg by mouth 2 (two) times daily.        Marland Kitchen lisinopril (PRINIVIL,ZESTRIL) 20 MG  tablet Take 20 mg by mouth 2 (two) times daily.        . metFORMIN (GLUCOPHAGE) 1000 MG tablet Take 1,000 mg by mouth 2 (two) times daily with a meal.        . metoprolol (TOPROL-XL) 100 MG 24 hr tablet Take 100 mg by mouth 2 (two) times daily.        . nisoldipine (SULAR) 34 MG 24 hr tablet Take 34 mg by mouth daily.        Marland Kitchen omeprazole (PRILOSEC) 40 MG capsule Take 40 mg by mouth 2 (two) times daily.        . traMADol (ULTRAM) 50 MG tablet Take 50 mg by mouth every 6 (six) hours as needed. Maximum dose= 8 tablets per day. For pain       . triamcinolone (KENALOG) 0.025 % ointment Apply topically 2 (two) times daily.  30 g  0    Results for orders placed during the hospital encounter of 01/21/12 (from the past 48 hour(s))  POCT HEMOGLOBIN-HEMACUE     Status: Abnormal   Collection Time   01/21/12  9:38 AM      Component Value Range  Comment   Hemoglobin 8.7 (*) 12.0 - 15.0 (g/dL)    No results found.  Review of Systems  Constitutional: Negative.   HENT: Negative.   Eyes: Negative.   Respiratory: Negative.   Cardiovascular: Negative.   Gastrointestinal: Negative.   Genitourinary: Negative.   Musculoskeletal: Negative.   Skin:       Ulcers of bilateral legs  Neurological: Negative.   Endo/Heme/Allergies: Negative.   Psychiatric/Behavioral: Negative.     Blood pressure 128/71, pulse 61, temperature 98.1 F (36.7 C), temperature source Oral, resp. rate 18, SpO2 99.00%. Physical Exam  Constitutional: She appears well-developed and well-nourished.  HENT:  Head: Normocephalic.  Eyes: Conjunctivae are normal. Pupils are equal, round, and reactive to light.  Cardiovascular: Normal rate.   Respiratory: Effort normal. No respiratory distress. She has no wheezes.  GI: Soft. She exhibits no distension. There is no tenderness.  Neurological: She is alert.  Skin:       Bilateral lower extremity ulcers  Psychiatric: She has a normal mood and affect. Her behavior is normal.      Assessment/Plan Bilateral lower extremity ulcerations.  Irrigation and debridement of ulcers with placement of Acell and the VAC.  The consent was signed and risks reviewed.  Risks included bleeding, pain, scar and risk of anesthesia. The patient and family wish to proceed.  SANGER,Luiscarlos Kaczmarczyk 01/21/2012, 10:52 AM

## 2012-01-21 NOTE — Brief Op Note (Signed)
01/21/2012  12:21 PM  PATIENT:  [name]  78 y.o. female  PRE-OPERATIVE DIAGNOSIS:  Bilateral lower extremity ulcers  POST-OPERATIVE DIAGNOSIS:  Bilateral lower extremity ulcers  PROCEDURE:  Procedure(s) (LRB): IRRIGATION AND DEBRIDEMENT BILATERAL LOWER EXTREMITY ULCERS (SKIN SUBCUTANEOUS TISSUE AND MUSCLE - 15 x 15 cm TOTAL)  SURGEON:  Surgeon(s) and Role:    * Jaziah Kwasnik Sanger, DO - Primary  PHYSICIAN ASSISTANT:   ASSISTANTS: none   ANESTHESIA:   general  EBL:  Total I/O In: 1300 [I.V.:1300] Out: -   BLOOD ADMINISTERED:none  DRAINS: none   LOCAL MEDICATIONS USED:  NONE  SPECIMEN:  No Specimen  DISPOSITION OF SPECIMEN:  N/A  COUNTS:  YES  TOURNIQUET:  * No tourniquets in log *  DICTATION: dictated  PLAN OF CARE: Discharge to home after PACU  PATIENT DISPOSITION:  PACU - hemodynamically stable.   Delay start of Pharmacological VTE agent (>24hrs) due to surgical blood loss or risk of bleeding: no

## 2012-01-21 NOTE — Anesthesia Postprocedure Evaluation (Signed)
  Anesthesia Post-op Note  Patient: [name]  Procedure(s) Performed: Procedure(s) (LRB): IRRIGATION AND DEBRIDEMENT WOUND (Bilateral)  Patient Location: PACU  Anesthesia Type: General  Level of Consciousness: awake, alert  and oriented  Airway and Oxygen Therapy: Patient Spontanous Breathing and Patient connected to face mask oxygen  Post-op Pain: mild  Post-op Assessment: Post-op Vital signs reviewed, Patient's Cardiovascular Status Stable, Respiratory Function Stable, Patent Airway, No signs of Nausea or vomiting and Pain level controlled  Post-op Vital Signs: Reviewed and stable  Complications: No apparent anesthesia complications

## 2012-01-21 NOTE — Transfer of Care (Signed)
Immediate Anesthesia Transfer of Care Note  Patient: [name]  Procedure(s) Performed: Procedure(s) (LRB): IRRIGATION AND DEBRIDEMENT WOUND (N/A)  Patient Location: PACU  Anesthesia Type: General  Level of Consciousness: awake, alert  and oriented  Airway & Oxygen Therapy: Patient Spontanous Breathing and Patient connected to face mask oxygen  Post-op Assessment: Report given to PACU RN and Post -op Vital signs reviewed and stable  Post vital signs: Reviewed and stable  Complications: No apparent anesthesia complications

## 2012-01-22 NOTE — Op Note (Signed)
NAMEDONN, WILMOT              ACCOUNT NO.:  [id number]  MEDICAL RECORD NO.:  [id number]  LOCATION:                                 FACILITY:  PHYSICIAN:  Wayland Denis, DO      DATE OF BIRTH:  1931-05-07  DATE OF PROCEDURE:  01/21/2012 DATE OF DISCHARGE:                              OPERATIVE REPORT   PREOPERATIVE DIAGNOSIS:  Bilateral lower extremity ulcers.  POSTOPERATIVE DIAGNOSIS:  Bilateral lower extremity ulcers.  PROCEDURE:  Irrigation and debridement of bilateral lower extremity ulcers, skin, subcutaneous tissue, and muscle, and placement of ACell and the vacuum-assisted closure (dressings).  ATTENDING SURGEON:  Wayland Denis, DO  ANESTHESIA:  General.  INDICATION FOR PROCEDURE:  The patient is a 78 year old female who had bilateral lower extremity ulcers.  She has been undergoing conservative treatment for the past month and the decision was made to bring her to the operating room for formal irrigation and debridement.  She was seen in holding.  Risks and complications were reviewed including bleeding, pain, scar, risk of anesthesia, scar contracture, and need for other interventions.  She was marked.  Consent was confirmed and signed.  DESCRIPTION OF PROCEDURE:  The patient was taken to the operating room, placed on the operating room table in supine position.  General anesthesia was administered.  Once adequate time-out was called, all information was confirmed to be correct.  She was prepped and draped in the usual sterile fashion.  Copious amounts of irrigation was used to irrigate the wound.  The 10 blade was used to make an incision around the skin edge to prepare the skin edge and remove nonviable tissue.  A curette and rasp were used to debride the soft tissue, as well as a pair of scissors, and included the soft tissue and muscle.  Antibiotic solution was used to irrigate the wound as well.  Hemostasis was achieved with electrocautery.  Once the wound  was cleaned and free of nonviable tissue, ACell powder was sprinkled on the wound and an ACell sheet was placed after it was prepared according to the manufacturer's guidelines and fenestrated.  Adaptic was applied over the ACell and stapled into place, and Hydrogel was placed on the Adaptic and VAC sponge on the left leg.  Collier Flowers was placed and the [facility] was placed at 50 mmHg pressure with an excellent seal.  The right leg had several small wounds that ranged from close to the knee to further down the leg. Therefore, the decision was made to use local treatment for these.  The Adaptic was placed and stapled. Hydrogel was then applied with a  4x4 gauze and Kerlix with an ACE wrap. The patient was also placed in left Cam boot for stabilization of the [facility] The patient tolerated the procedure well.  There were no complications. She was awoken and taken to recovery room in stable condition.     Wayland Denis, DO     CS/MEDQ  D:  01/21/2012  T:  01/22/2012  Job:  [id number]

## 2012-01-23 ENCOUNTER — Encounter (HOSPITAL_BASED_OUTPATIENT_CLINIC_OR_DEPARTMENT_OTHER): Payer: Medicare Other

## 2012-01-28 ENCOUNTER — Encounter (HOSPITAL_BASED_OUTPATIENT_CLINIC_OR_DEPARTMENT_OTHER): Payer: Self-pay | Admitting: Plastic Surgery

## 2012-01-30 NOTE — Progress Notes (Signed)
[facility]  NAME:  [name], [name]                   ACCOUNT NO.:  MEDICAL RECORD NO.:  [id number]      DATE OF BIRTH:  August 17, 1931  PHYSICIAN:  Wayland Denis, DO       VISIT DATE:  01/30/2012                                  OFFICE VISIT   HISTORY:  The patient is a 78 year old female here with family for followup on her bilateral lower extremity ulcers.  She underwent irrigation, debridement, and placement of ACell and the VAC last week. The VAC is removed today.  There is no change in her medications or social history.  PHYSICAL EXAM:  GENERAL: She is alert and oriented, cooperative.  She is feeling better and doing better. HEENT: Her pupils are equal.  Extraocular muscles are intact. NECK: No cervical lymphadenopathy. LUNGS: Her breathing is unlabored. CARDIOVASCULAR: Her heart is regular. ABDOMEN:  Soft.  The wounds are covered with ACell and Adaptic.  The VAC was removed and will be replaced with Hydrogel on the left and the right.  We will do Hydrogel every other day with Kerlix wrapped.  Overall, it looks to be improving.  We still need granulation over the bone, but we will continue with the [facility] for this week and re-evaluate in 1 week's time.     Wayland Denis, DO     CS/MEDQ  D:  01/30/2012  T:  01/30/2012  Job:  [id number]

## 2012-02-06 ENCOUNTER — Encounter (HOSPITAL_BASED_OUTPATIENT_CLINIC_OR_DEPARTMENT_OTHER): Payer: Medicare Other | Attending: Plastic Surgery

## 2012-02-06 DIAGNOSIS — E1169 Type 2 diabetes mellitus with other specified complication: Secondary | ICD-10-CM | POA: Insufficient documentation

## 2012-02-06 DIAGNOSIS — L97809 Non-pressure chronic ulcer of other part of unspecified lower leg with unspecified severity: Secondary | ICD-10-CM | POA: Insufficient documentation

## 2012-02-06 DIAGNOSIS — I10 Essential (primary) hypertension: Secondary | ICD-10-CM | POA: Insufficient documentation

## 2012-02-06 DIAGNOSIS — Z79899 Other long term (current) drug therapy: Secondary | ICD-10-CM | POA: Insufficient documentation

## 2012-02-07 NOTE — Progress Notes (Signed)
[facility]  NAMECOLLEEN, KOTLARZ              ACCOUNT NO.:  [id number]  MEDICAL RECORD NO.:  [id number]      DATE OF BIRTH:  [birth year]  PHYSICIAN:  Wayland Denis, DO            VISIT DATE:                                  OFFICE VISIT   The patient is here for followup after undergoing irrigation and debridement with placement of ACell and a VAC in the operating room. She is doing very well.  The area is healing in and granulating.  She still has a little bit of bone exposed, but it seems to be granulating from the sides.  There has been no change in her medications or social history.  PHYSICAL EXAMINATION:  GENERAL:  She is alert, oriented, cooperative, not in any acute distress.  She is very pleasant. HEENT:  Pupils equal.  Extraocular muscles are intact. NECK:  No cervical lymphadenopathy. LUNGS:  Her breathing is unlabored. HEART:  Regular. ABDOMEN:  Soft.  The wound is described above and noted in the nurse's note.  We will continue with collagen and the VAC and see her back in 1 week.     Wayland Denis, DO     CS/MEDQ  D:  02/06/2012  T:  02/07/2012  Job:  [id number]

## 2012-02-13 ENCOUNTER — Ambulatory Visit (HOSPITAL_COMMUNITY)
Admission: RE | Admit: 2012-02-13 | Discharge: 2012-02-13 | Disposition: A | Discharge status: Home / Self Care | Payer: Medicare Other | Source: Ambulatory Visit | Attending: Plastic Surgery | Admitting: Plastic Surgery

## 2012-02-13 ENCOUNTER — Other Ambulatory Visit: Payer: Self-pay | Admitting: Plastic Surgery

## 2012-02-13 ENCOUNTER — Other Ambulatory Visit (HOSPITAL_COMMUNITY): Payer: Self-pay | Admitting: Plastic Surgery

## 2012-02-13 DIAGNOSIS — T8189XA Other complications of procedures, not elsewhere classified, initial encounter: Secondary | ICD-10-CM

## 2012-02-13 DIAGNOSIS — E119 Type 2 diabetes mellitus without complications: Secondary | ICD-10-CM | POA: Insufficient documentation

## 2012-02-13 DIAGNOSIS — Y838 Other surgical procedures as the cause of abnormal reaction of the patient, or of later complication, without mention of misadventure at the time of the procedure: Secondary | ICD-10-CM | POA: Insufficient documentation

## 2012-02-13 NOTE — Progress Notes (Signed)
[facility]  NAMESOYLA, BAINTER              ACCOUNT NO.:  [id number]  MEDICAL RECORD NO.:  [id number]      DATE OF BIRTH:  [birth year]  PHYSICIAN:  Wayland Denis, DO       VISIT DATE:  02/13/2012                                  OFFICE VISIT   The patient is a 78 year old black female, who is here with her family for a followup on her bilateral lower extremity ulcers, secondary to her diabetes.  She underwent irrigation, debridement, and placement of ACell and a VAC.  She has had very good improvement.  Some of the smaller wounds are completely healed over and epithelialized.  The larger wound is granulating extremely well.  There is still a portion of bone that is exposed.  Nothing is malodorous or looks infected.  There is no fibrous tissue.  Everything looks much healthier.  She has been using the VAC. I went ahead and took the Adaptic off and replaced it and placed some Hydrogel and collagen and the VAC.  PHYSICAL EXAMINATION:  GENERAL:  She is alert, oriented, cooperative, not in any acute distress, very pleasant. HEENT:  Pupils equal.  Extraocular muscles are intact. NECK:  No cervical lymphadenopathy. SKIN:  The wound is as described and the VAC was replaced, and we got her on the schedule for Monday, will likely either use more ACell or Integra to get the bone portion covered and continue with the VAC, and we will see her back in followup after that.     Alan Ripper Sanger, DO     CS/MEDQ  D:  02/13/2012  T:  02/13/2012  Job:  [id number]

## 2012-02-14 NOTE — Progress Notes (Signed)
Pt here 01/21/12 Needs istat

## 2012-02-18 ENCOUNTER — Encounter (HOSPITAL_BASED_OUTPATIENT_CLINIC_OR_DEPARTMENT_OTHER)
Admission: RE | Disposition: A | Discharge status: Home / Self Care | Payer: Self-pay | Source: Ambulatory Visit | Attending: Plastic Surgery

## 2012-02-18 ENCOUNTER — Ambulatory Visit (HOSPITAL_BASED_OUTPATIENT_CLINIC_OR_DEPARTMENT_OTHER)
Admission: RE | Admit: 2012-02-18 | Discharge: 2012-02-18 | Disposition: A | Discharge status: Home / Self Care | Payer: Medicare Other | Source: Ambulatory Visit | Attending: Plastic Surgery | Admitting: Plastic Surgery

## 2012-02-18 ENCOUNTER — Ambulatory Visit (HOSPITAL_BASED_OUTPATIENT_CLINIC_OR_DEPARTMENT_OTHER): Payer: Medicare Other | Admitting: Anesthesiology

## 2012-02-18 ENCOUNTER — Encounter (HOSPITAL_BASED_OUTPATIENT_CLINIC_OR_DEPARTMENT_OTHER): Payer: Self-pay | Admitting: Anesthesiology

## 2012-02-18 ENCOUNTER — Encounter (HOSPITAL_BASED_OUTPATIENT_CLINIC_OR_DEPARTMENT_OTHER): Payer: Self-pay | Admitting: Plastic Surgery

## 2012-02-18 ENCOUNTER — Encounter (HOSPITAL_BASED_OUTPATIENT_CLINIC_OR_DEPARTMENT_OTHER): Payer: Self-pay | Admitting: *Deleted

## 2012-02-18 DIAGNOSIS — I739 Peripheral vascular disease, unspecified: Secondary | ICD-10-CM | POA: Insufficient documentation

## 2012-02-18 DIAGNOSIS — L97809 Non-pressure chronic ulcer of other part of unspecified lower leg with unspecified severity: Secondary | ICD-10-CM

## 2012-02-18 DIAGNOSIS — L97909 Non-pressure chronic ulcer of unspecified part of unspecified lower leg with unspecified severity: Secondary | ICD-10-CM | POA: Insufficient documentation

## 2012-02-18 DIAGNOSIS — K219 Gastro-esophageal reflux disease without esophagitis: Secondary | ICD-10-CM | POA: Insufficient documentation

## 2012-02-18 DIAGNOSIS — E1142 Type 2 diabetes mellitus with diabetic polyneuropathy: Secondary | ICD-10-CM

## 2012-02-18 DIAGNOSIS — E785 Hyperlipidemia, unspecified: Secondary | ICD-10-CM | POA: Insufficient documentation

## 2012-02-18 DIAGNOSIS — E11622 Type 2 diabetes mellitus with other skin ulcer: Secondary | ICD-10-CM

## 2012-02-18 DIAGNOSIS — E119 Type 2 diabetes mellitus without complications: Secondary | ICD-10-CM | POA: Insufficient documentation

## 2012-02-18 DIAGNOSIS — I10 Essential (primary) hypertension: Secondary | ICD-10-CM | POA: Insufficient documentation

## 2012-02-18 HISTORY — PX: SKIN SPLIT GRAFT: SHX444

## 2012-02-18 LAB — POCT I-STAT, CHEM 8
Calcium, Ion: 1.18 mmol/L (ref 1.12–1.32)
Chloride: 103 mEq/L (ref 96–112)
HCT: 29 % — ABNORMAL LOW (ref 36.0–46.0)
Hemoglobin: 9.9 g/dL — ABNORMAL LOW (ref 12.0–15.0)
Potassium: 4.4 mEq/L (ref 3.5–5.1)

## 2012-02-18 LAB — GLUCOSE, CAPILLARY: Glucose-Capillary: 96 mg/dL (ref 70–99)

## 2012-02-18 SURGERY — APPLICATION, GRAFT, SKIN, SPLIT-THICKNESS
Anesthesia: General | Site: Leg Lower | Laterality: Bilateral | Wound class: Contaminated

## 2012-02-18 MED ORDER — ONDANSETRON HCL 4 MG/2ML IJ SOLN
INTRAMUSCULAR | Status: DC | PRN
  Administered 2012-02-18: 4 mg via INTRAVENOUS

## 2012-02-18 MED ORDER — PROPOFOL 10 MG/ML IV EMUL
INTRAVENOUS | Status: DC | PRN
  Administered 2012-02-18: 150 mg via INTRAVENOUS
  Administered 2012-02-18: 30 mg via INTRAVENOUS

## 2012-02-18 MED ORDER — FENTANYL CITRATE 0.05 MG/ML IJ SOLN
INTRAMUSCULAR | Status: DC | PRN
  Administered 2012-02-18 (×2): 25 ug via INTRAVENOUS

## 2012-02-18 MED ORDER — LIDOCAINE HCL (CARDIAC) 20 MG/ML IV SOLN
INTRAVENOUS | Status: DC | PRN
  Administered 2012-02-18: 80 mg via INTRAVENOUS

## 2012-02-18 MED ORDER — CEFAZOLIN SODIUM 1-5 GM-% IV SOLN
INTRAVENOUS | Status: DC | PRN
  Administered 2012-02-18: 1 g via INTRAVENOUS

## 2012-02-18 MED ORDER — LACTATED RINGERS IV SOLN
INTRAVENOUS | Status: DC
  Administered 2012-02-18 (×3): via INTRAVENOUS

## 2012-02-18 MED ORDER — TRAMADOL HCL 50 MG PO TABS
50.0000 mg | ORAL_TABLET | Freq: Once | ORAL | Status: AC | PRN
  Administered 2012-02-18: 50 mg via ORAL

## 2012-02-18 MED ORDER — SODIUM CHLORIDE 0.9 % IR SOLN
Status: DC | PRN
  Administered 2012-02-18: 12:00:00

## 2012-02-18 SURGICAL SUPPLY — 77 items
ADH SKN CLS APL DERMABOND .7 (GAUZE/BANDAGES/DRESSINGS)
APL SKNCLS STERI-STRIP NONHPOA (GAUZE/BANDAGES/DRESSINGS)
BAG DECANTER FOR FLEXI CONT (MISCELLANEOUS) IMPLANT
BALL CTTN LRG ABS STRL LF (GAUZE/BANDAGES/DRESSINGS) ×2
BANDAGE ELASTIC 3 VELCRO ST LF (GAUZE/BANDAGES/DRESSINGS) IMPLANT
BANDAGE ELASTIC 4 VELCRO ST LF (GAUZE/BANDAGES/DRESSINGS) ×3 IMPLANT
BANDAGE ELASTIC 6 VELCRO ST LF (GAUZE/BANDAGES/DRESSINGS) IMPLANT
BANDAGE GAUZE ELAST BULKY 4 IN (GAUZE/BANDAGES/DRESSINGS) ×2 IMPLANT
BENZOIN TINCTURE PRP APPL 2/3 (GAUZE/BANDAGES/DRESSINGS) IMPLANT
BLADE DERMATOME SS (BLADE) IMPLANT
BLADE HEX COATED 2.75 (ELECTRODE) IMPLANT
BLADE SURG 10 STRL SS (BLADE) IMPLANT
BLADE SURG 15 STRL LF DISP TIS (BLADE) ×1 IMPLANT
BLADE SURG 15 STRL SS (BLADE) ×2
BLADE SURG ROTATE 9660 (MISCELLANEOUS) IMPLANT
BNDG COHESIVE 4X5 TAN STRL (GAUZE/BANDAGES/DRESSINGS) ×1 IMPLANT
CLOTH BEACON ORANGE TIMEOUT ST (SAFETY) ×2 IMPLANT
COTTONBALL LRG STERILE PKG (GAUZE/BANDAGES/DRESSINGS) ×4 IMPLANT
COVER MAYO STAND STRL (DRAPES) ×2 IMPLANT
COVER TABLE BACK 60X90 (DRAPES) ×2 IMPLANT
DECANTER SPIKE VIAL GLASS SM (MISCELLANEOUS) IMPLANT
DERMABOND ADVANCED (GAUZE/BANDAGES/DRESSINGS)
DERMABOND ADVANCED .7 DNX12 (GAUZE/BANDAGES/DRESSINGS) IMPLANT
DERMACARRIERS GRAFT 1 TO 1.5 (DISPOSABLE)
DRAPE EXTREMITY T 121X128X90 (DRAPE) ×2 IMPLANT
DRAPE INCISE IOBAN 66X45 STRL (DRAPES) ×1 IMPLANT
DRAPE PED LAPAROTOMY (DRAPES) IMPLANT
DRAPE SURG 17X23 STRL (DRAPES) ×4 IMPLANT
DRAPE U-SHAPE 76X120 STRL (DRAPES) IMPLANT
DRSG ADAPTIC 3X8 NADH LF (GAUZE/BANDAGES/DRESSINGS) ×2 IMPLANT
DRSG EMULSION OIL 3X3 NADH (GAUZE/BANDAGES/DRESSINGS) ×2 IMPLANT
DRSG OPSITE 6X11 MED (GAUZE/BANDAGES/DRESSINGS) IMPLANT
DRSG PAD ABDOMINAL 8X10 ST (GAUZE/BANDAGES/DRESSINGS) ×1 IMPLANT
DRSG TEGADERM 4X10 (GAUZE/BANDAGES/DRESSINGS) IMPLANT
ELECT REM PT RETURN 9FT ADLT (ELECTROSURGICAL) ×2
ELECTRODE REM PT RTRN 9FT ADLT (ELECTROSURGICAL) IMPLANT
GAUZE SPONGE 4X4 16PLY XRAY LF (GAUZE/BANDAGES/DRESSINGS) IMPLANT
GLOVE BIO SURGEON STRL SZ 6.5 (GLOVE) ×4 IMPLANT
GOWN PREVENTION PLUS XLARGE (GOWN DISPOSABLE) ×3 IMPLANT
GRAFT DERMACARRIERS 1 TO 1.5 (DISPOSABLE) IMPLANT
MATRIX SURGICAL PSM 10X15CM (Tissue) ×1 IMPLANT
MICROMATRIX 500MG (Tissue) ×2 IMPLANT
NEEDLE 27GAX1X1/2 (NEEDLE) ×2 IMPLANT
NS IRRIG 1000ML POUR BTL (IV SOLUTION) ×3 IMPLANT
PACK BASIN DAY SURGERY FS (CUSTOM PROCEDURE TRAY) ×2 IMPLANT
PAD CAST 3X4 CTTN HI CHSV (CAST SUPPLIES) IMPLANT
PAD CAST 4YDX4 CTTN HI CHSV (CAST SUPPLIES) IMPLANT
PADDING CAST ABS 4INX4YD NS (CAST SUPPLIES)
PADDING CAST ABS COTTON 4X4 ST (CAST SUPPLIES) IMPLANT
PADDING CAST COTTON 3X4 STRL (CAST SUPPLIES)
PADDING CAST COTTON 4X4 STRL (CAST SUPPLIES)
PENCIL BUTTON HOLSTER BLD 10FT (ELECTRODE) ×1 IMPLANT
SHEET MEDIUM DRAPE 40X70 STRL (DRAPES) ×1 IMPLANT
SOLUTION PARTIC MCRMTRX 500MG (Tissue) IMPLANT
SPONGE GAUZE 4X4 12PLY (GAUZE/BANDAGES/DRESSINGS) ×3 IMPLANT
SPONGE LAP 18X18 X RAY DECT (DISPOSABLE) ×2 IMPLANT
STAPLER VISISTAT 35W (STAPLE) ×1 IMPLANT
STOCKINETTE 4X48 STRL (DRAPES) IMPLANT
STOCKINETTE 6  STRL (DRAPES)
STOCKINETTE 6 STRL (DRAPES) IMPLANT
STOCKINETTE IMPERVIOUS LG (DRAPES) ×2 IMPLANT
STRIP CLOSURE SKIN 1/2X4 (GAUZE/BANDAGES/DRESSINGS) IMPLANT
SURGILUBE 2OZ TUBE FLIPTOP (MISCELLANEOUS) ×1 IMPLANT
SUT MON AB 5-0 PS2 18 (SUTURE) IMPLANT
SUT SILK 3 0 SH CR/8 (SUTURE) IMPLANT
SUT SILK 4 0 SH CR/8 (SUTURE) IMPLANT
SUT VIC AB 3-0 FS2 27 (SUTURE) IMPLANT
SUT VIC AB 5-0 P-3 18X BRD (SUTURE) IMPLANT
SUT VIC AB 5-0 P3 18 (SUTURE)
SUT VIC AB 5-0 PS2 18 (SUTURE) ×3 IMPLANT
SUT VICRYL 4-0 PS2 18IN ABS (SUTURE) IMPLANT
SYR BULB 3OZ (MISCELLANEOUS) ×2 IMPLANT
SYR CONTROL 10ML LL (SYRINGE) ×1 IMPLANT
TOWEL OR 17X24 6PK STRL BLUE (TOWEL DISPOSABLE) ×2 IMPLANT
TRAY DSU PREP LF (CUSTOM PROCEDURE TRAY) ×2 IMPLANT
UNDERPAD 30X30 INCONTINENT (UNDERPADS AND DIAPERS) ×2 IMPLANT
WATER STERILE IRR 1000ML POUR (IV SOLUTION) ×1 IMPLANT

## 2012-02-18 NOTE — Discharge Instructions (Addendum)
VAC at 75 mmHg continuous pressure on the left leg Change right leg dressing every other day starting Wed or Thurs.  Place hydrogel and gauze. Leave Adaptic in place  [facility]  [address] [place], [place] [address] [phone]   Post Anesthesia Home Care Instructions  Activity: Get plenty of rest for the remainder of the day. A responsible adult should stay with you for 24 hours following the procedure.  For the next 24 hours, DO NOT: -Drive a car -Advertising copywriter -Drink alcoholic beverages -Take any medication unless instructed by your physician -Make any legal decisions or sign important papers.  Meals: Start with liquid foods such as gelatin or soup. Progress to regular foods as tolerated. Avoid greasy, spicy, heavy foods. If nausea and/or vomiting occur, drink only clear liquids until the nausea and/or vomiting subsides. Call your physician if vomiting continues.  Special Instructions/Symptoms: Your throat may feel dry or sore from the anesthesia or the breathing tube placed in your throat during surgery. If this causes discomfort, gargle with warm salt water. The discomfort should disappear within 24 hours.

## 2012-02-18 NOTE — H&P (Signed)
[name] is an 78 y.o. female.   Chief Complaint: Left lower extremity ulcers HPI: The patient is a 78 yrs old bf with ulcerations of her lower extremities.  She has undergone local care and surgical management with great improvement.  She had surgery 2 weeks ago with irrigation and debridement followed by Acell placement. She was managed with the VAC.  She now presents for further surgical care.  Past Medical History  Diagnosis Date  . Diabetes mellitus   . Hypertension   . Hyperlipemia   . Phlebitis 11/30/11    RLE  . Skin ulcer(s) 11/30/11    BLE; draining  . GERD (gastroesophageal reflux disease)   . Arthritis   . Anxiety   . Peripheral vascular disease   . Neuromuscular disorder     numbness feet    Past Surgical History  Procedure Date  . Abdominal hysterectomy   . Cataract extraction w/ intraocular lens  implant, bilateral 1990's  . Blood clot 1990's    RLE; "cut out"  . Eye surgery   . Incision and drainage of wound 01/21/2012    Procedure: IRRIGATION AND DEBRIDEMENT WOUND;  Surgeon: Wayland Denis, DO;  Location: [facility];  Service: Plastics;  Laterality: Bilateral;  I&D bilateral lower extremities and placement of wound vac left lower leg    No family history on file. Social History:  reports that she has never smoked. She has never used smokeless tobacco. She reports that she does not drink alcohol or use illicit drugs.  Allergies: No Known Allergies  No current facility-administered medications on file as of .   Medications Prior to Admission  Medication Sig Dispense Refill  . ALPRAZolam (XANAX) 0.25 MG tablet Take 0.25 mg by mouth daily.        Marland Kitchen aspirin 81 MG chewable tablet Chew 81 mg by mouth daily.        . calcium carbonate (OS-CAL) 600 MG TABS Take 600 mg by mouth daily.        . cloNIDine (CATAPRES) 0.3 MG tablet Take 0.3 mg by mouth 3 (three) times daily.        Marland Kitchen gabapentin (NEURONTIN) 300 MG capsule Take 300 mg by mouth 2 (two)  times daily.        Marland Kitchen lisinopril (PRINIVIL,ZESTRIL) 20 MG tablet Take 20 mg by mouth 2 (two) times daily.        . metFORMIN (GLUCOPHAGE) 1000 MG tablet Take 1,000 mg by mouth 2 (two) times daily with a meal.        . metoprolol (TOPROL-XL) 100 MG 24 hr tablet Take 100 mg by mouth 2 (two) times daily.        . nisoldipine (SULAR) 34 MG 24 hr tablet Take 34 mg by mouth daily.        Marland Kitchen omeprazole (PRILOSEC) 40 MG capsule Take 40 mg by mouth 2 (two) times daily.        . traMADol (ULTRAM) 50 MG tablet Take 50 mg by mouth every 6 (six) hours as needed. Maximum dose= 8 tablets per day. For pain       . triamcinolone (KENALOG) 0.025 % ointment Apply topically 2 (two) times daily.  30 g  0    No results found for this or any previous visit (from the past 48 hour(s)). No results found.  Review of Systems  Constitutional: Negative.   HENT: Negative.   Eyes: Negative.   Respiratory: Negative.   Cardiovascular: Negative.   Gastrointestinal:  Negative.   Genitourinary: Negative.   Musculoskeletal: Negative.   Skin: Negative.   Neurological: Negative.   Endo/Heme/Allergies: Negative.   Psychiatric/Behavioral: Negative.     There were no vitals taken for this visit. Physical Exam  Constitutional: She appears well-developed and well-nourished.  HENT:  Head: Normocephalic.  Eyes: EOM are normal. Pupils are equal, round, and reactive to light.  Neck: Normal range of motion.  Cardiovascular: Normal rate.   Respiratory: Effort normal. No respiratory distress. She has no wheezes.  GI: Soft. She exhibits no distension.  Neurological: She is alert.  Psychiatric: She has a normal mood and affect. Her behavior is normal. Judgment and thought content normal.     Assessment/Plan Plan for irrigation and debridement of any non viable tissue with placement of integra, Acell and the VAC.  The patient was seen in holding with risks and complications reviewed.  No change in history from last  visit.  SANGER,Brigida Scotti 02/18/2012, 9:20 AM

## 2012-02-18 NOTE — Transfer of Care (Signed)
Immediate Anesthesia Transfer of Care Note  Patient: [name]  Procedure(s) Performed: Procedure(s) (LRB): SKIN GRAFT SPLIT THICKNESS (Bilateral)  Patient Location: PACU  Anesthesia Type: General  Level of Consciousness: sedated  Airway & Oxygen Therapy: Patient Spontanous Breathing and Patient connected to face mask oxygen  Post-op Assessment: Report given to PACU RN and Post -op Vital signs reviewed and stable  Post vital signs: Reviewed and stable  Complications: No apparent anesthesia complications

## 2012-02-18 NOTE — Anesthesia Procedure Notes (Signed)
Procedure Name: Intubation Date/Time: 02/18/2012 11:52 AM Performed by: Gar Gibbon Pre-anesthesia Checklist: Patient identified, Emergency Drugs available, Suction available and Patient being monitored Patient Re-evaluated:Patient Re-evaluated prior to inductionOxygen Delivery Method: Circle system utilized Preoxygenation: Pre-oxygenation with 100% oxygen Intubation Type: IV induction Ventilation: Mask ventilation without difficulty Laryngoscope Size: Miller and 2 Grade View: Grade II Tube type: Oral Tube size: 7.0 mm Number of attempts: 1 Placement Confirmation: ETT inserted through vocal cords under direct vision,  positive ETCO2 and breath sounds checked- equal and bilateral Secured at: 20 cm Tube secured with: Tape Dental Injury: Teeth and Oropharynx as per pre-operative assessment  Comments: LMA #4placed and manipulated without obtaining a good seal. Dr Phoebe Perch agrees with plan change to OET placement.

## 2012-02-18 NOTE — Anesthesia Postprocedure Evaluation (Signed)
  Anesthesia Post-op Note  Patient: [name]  Procedure(s) Performed: Procedure(s) (LRB): SKIN GRAFT SPLIT THICKNESS (Bilateral)  Patient Location: PACU  Anesthesia Type: General  Level of Consciousness: awake and alert   Airway and Oxygen Therapy: Patient Spontanous Breathing  Post-op Pain: mild  Post-op Assessment: Post-op Vital signs reviewed, Patient's Cardiovascular Status Stable, Respiratory Function Stable and Patent Airway  Post-op Vital Signs: stable  Complications: No apparent anesthesia complications

## 2012-02-18 NOTE — Brief Op Note (Signed)
02/18/2012  1:04 PM  PATIENT:  [name]  78 y.o. female  PRE-OPERATIVE DIAGNOSIS:  bilateral lower extremity ulcers  POST-OPERATIVE DIAGNOSIS:  bilateral lower extremity ulcers  PROCEDURE:  Procedure(s) (LRB): Irrigation and debridement of bilateral lower extremity ulcers with placement of Acell bilaterally and the VAC on the left leg  SURGEON:  Surgeon(s) and Role:    * Justus Duerr Sanger, DO - Primary  PHYSICIAN ASSISTANT: none  ASSISTANTS: none   ANESTHESIA:   general  EBL:  Total I/O In: 1100 [I.V.:1100] Out: -   BLOOD ADMINISTERED:none  DRAINS: none   LOCAL MEDICATIONS USED:  NONE  SPECIMEN:  No Specimen  DISPOSITION OF SPECIMEN:  N/A  COUNTS:  YES  TOURNIQUET:  * No tourniquets in log *  DICTATION: dictated  PLAN OF CARE: Discharge to home after PACU  PATIENT DISPOSITION:  PACU - hemodynamically stable.   Delay start of Pharmacological VTE agent (>24hrs) due to surgical blood loss or risk of bleeding: no

## 2012-02-18 NOTE — Anesthesia Preprocedure Evaluation (Signed)
Anesthesia Evaluation  Patient identified by MRN, date of birth, ID band Patient awake    Reviewed: Allergy & Precautions, H&P , NPO status , Patient's Chart, lab work & pertinent test results  Airway Mallampati: II  Neck ROM: full    Dental   Pulmonary          Cardiovascular hypertension,     Neuro/Psych Anxiety  Neuromuscular disease    GI/Hepatic GERD-  ,  Endo/Other  Diabetes mellitus-  Renal/GU      Musculoskeletal   Abdominal   Peds  Hematology   Anesthesia Other Findings   Reproductive/Obstetrics                           Anesthesia Physical Anesthesia Plan  ASA: III  Anesthesia Plan: General   Post-op Pain Management:    Induction: Intravenous  Airway Management Planned: LMA  Additional Equipment:   Intra-op Plan:   Post-operative Plan:   Informed Consent: I have reviewed the patients History and Physical, chart, labs and discussed the procedure including the risks, benefits and alternatives for the proposed anesthesia with the patient or authorized representative who has indicated his/her understanding and acceptance.     Plan Discussed with: CRNA and Surgeon  Anesthesia Plan Comments:         Anesthesia Quick Evaluation

## 2012-02-19 NOTE — Op Note (Signed)
NAMEFAHIMA, CIFELLI                   ACCOUNT NO.:  MEDICAL RECORD NO.:  [id number]  LOCATION:                                 FACILITY:  PHYSICIAN:  Wayland Denis, DO      DATE OF BIRTH:  09-12-1931  DATE OF PROCEDURE:  02/18/2012 DATE OF DISCHARGE:                              OPERATIVE REPORT   PREOPERATIVE DIAGNOSIS:  Bilateral lower extremity diabetic ulcers.  POSTOPERATIVE DIAGNOSIS:  Bilateral lower extremity diabetic ulcers.  PROCEDURE:  Irrigation of bilateral lower extremity ulcers with debridement of skin, placement of ACell and a VAC on left leg and ACell on right leg.  ATTENDING:  Wayland Denis, DO  ANESTHESIA:  General.  INDICATION FOR PROCEDURE:  The patient is a 78 year old female who has been undergoing care of her bilateral lower extremity ulcers and presents for further treatment.  DESCRIPTION OF PROCEDURE:  The patient was taken to the operating room and placed on the operating room table in a supine position.  After her consent was signed, confirmed, and she was marked in the holding room. She was prepped and draped in usual sterile fashion.  A time-out was called.  All information was confirmed to be correct.  Copious amounts of antibiotic solution and normal saline was used to irrigate her bilateral lower extremities.  The skin edges were freshened using a 15- blade.  The bone was chiseled to get punctate bleeding.  Once that was done, the ACell powder and sheet was placed over the left leg wound. Adaptic was then placed with Hydrogel and a VAC sponge.  The VAC was set at 75 mmHg continuous pressure.  The ACell sheaths were then placed with the power on the right lower extremity ulcers after they were debrided at the skin edge with a 15-blade.  Hydrogel was applied with 4x4s and a Kerlix.  The patient tolerated the procedure well.  There were no complications.  She was awoken and taken to recovery room in stable condition.     Wayland Denis,  DO     CS/MEDQ  D:  02/18/2012  T:  02/18/2012  Job:  [id number]

## 2012-02-21 ENCOUNTER — Encounter (HOSPITAL_BASED_OUTPATIENT_CLINIC_OR_DEPARTMENT_OTHER): Payer: Self-pay | Admitting: Plastic Surgery

## 2012-02-22 LAB — GLUCOSE, CAPILLARY
Glucose-Capillary: 105 mg/dL — ABNORMAL HIGH (ref 70–99)
Glucose-Capillary: 129 mg/dL — ABNORMAL HIGH (ref 70–99)
Glucose-Capillary: 165 mg/dL — ABNORMAL HIGH (ref 70–99)

## 2012-02-25 LAB — GLUCOSE, CAPILLARY
Glucose-Capillary: 116 mg/dL — ABNORMAL HIGH (ref 70–99)
Glucose-Capillary: 192 mg/dL — ABNORMAL HIGH (ref 70–99)

## 2012-02-26 LAB — GLUCOSE, CAPILLARY
Glucose-Capillary: 128 mg/dL — ABNORMAL HIGH (ref 70–99)
Glucose-Capillary: 165 mg/dL — ABNORMAL HIGH (ref 70–99)

## 2012-02-28 LAB — GLUCOSE, CAPILLARY: Glucose-Capillary: 134 mg/dL — ABNORMAL HIGH (ref 70–99)

## 2012-02-28 NOTE — Progress Notes (Signed)
[facility]  NAMEKYNLEY, METZGER              ACCOUNT NO.:  [id number]  MEDICAL RECORD NO.:  [id number]      DATE OF BIRTH:  September 29, 1931  PHYSICIAN:  Wayland Denis, DO            VISIT DATE:                                  OFFICE VISIT   Ms. Dede is a 78 year old female who is here with her family for followup on her bilateral lower extremity ulcers.  She underwent surgery with placement of ACell and a VAC.  The bone is filling in very nicely and seems to be improving.  Overall, she is doing well and has tolerated the surgery.  PHYSICAL EXAMINATION:  GENERAL:  She is alert, oriented, cooperative, not in any acute distress.  She is pleasant. HEENT:  Pupils are equal.  Extraocular muscles are intact. NECK:  No cervical lymphadenopathy. LUNGS:  Her breathing is unlabored. HEART:  Regular. ABDOMEN:  Soft.  He wound is improving with very good granulating tissue and granulation over the bone occurring.  We will continue with the VAC over the Adaptic and have her follow up in 1 week.     Wayland Denis, DO     CS/MEDQ  D:  02/27/2012  T:  02/28/2012  Job:  [id number]

## 2012-02-29 NOTE — Op Note (Signed)
NAMESILVIE, OBREMSKI              ACCOUNT NO.:  [id number]  MEDICAL RECORD NO.:  [id number]  LOCATION:                                 FACILITY:  PHYSICIAN:  Wayland Denis, DO      DATE OF BIRTH:  [birth year]  DATE OF PROCEDURE:  01/21/2012 DATE OF DISCHARGE:                              OPERATIVE REPORT   PREOPERATIVE DIAGNOSIS:  Bilateral lower extremity ulcers.  POSTOPERATIVE DIAGNOSIS:  Bilateral lower extremity ulcers.  PROCEDURE: Surgical preparation with sharp debridement of bilateral lower extremity ulcers, skin, subcutaneous tissue, and muscle, and placement of ACell and the vacuum-assisted closure (dressings).  ATTENDING SURGEON:  Wayland Denis, DO  ANESTHESIA:  General.  INDICATION FOR PROCEDURE:  The patient is a 78 year old female who had bilateral lower extremity ulcers.  She has been undergoing conservative treatment for the past month and the decision was made to bring her to the operating room for formal irrigation and debridement.  She was seen in holding.  Risks and complications were reviewed including bleeding, pain, scar, risk of anesthesia, scar contracture, and need for other interventions.  She was marked.  Consent was confirmed and signed.  DESCRIPTION OF PROCEDURE:  The patient was taken to the operating room, placed on the operating room table in supine position.  General anesthesia was administered.  Once adequate time-out was called, all information was confirmed to be correct.  She was prepped and draped in the usual sterile fashion.  Copious amounts of irrigation was used to irrigate the wound.  The 10 blade was used to make an incision around the skin edge to prepare the skin edge and remove nonviable tissue.  A curette and rasp were used to debride the soft tissue, as well as a pair of scissors, and included the soft tissue and muscle.  Antibiotic solution was used to irrigate the wound as well.  Hemostasis was achieved with electrocautery.   Once the wound was cleaned and free of nonviable tissue, ACell powder was sprinkled on the wound and an ACell sheet was placed after it was prepared according to the manufacturer's guidelines and fenestrated.  Adaptic was applied over the ACell and stapled into place, and Hydrogel was placed on the Adaptic and VAC sponge on the left leg.  Collier Flowers was placed and the [facility] was placed at 50 mmHg pressure with an excellent seal.  The right leg had several small wounds that ranged from close to the knee to further down the leg. Therefore, the decision was made to use local treatment for these.  The Adaptic was placed and stapled. Hydrogel was then applied with a  4x4 gauze and Kerlix with an ACE wrap. The patient was also placed in left Cam boot for stabilization of the [facility] The patient tolerated the procedure well.  There were no complications. She was awoken and taken to recovery room in stable condition.     Wayland Denis, DO     CS/MEDQ  D:  01/21/2012  T:  01/22/2012  Job:  [id number]

## 2012-03-03 ENCOUNTER — Encounter (HOSPITAL_BASED_OUTPATIENT_CLINIC_OR_DEPARTMENT_OTHER): Payer: Medicare Other | Attending: Plastic Surgery

## 2012-03-03 DIAGNOSIS — L97809 Non-pressure chronic ulcer of other part of unspecified lower leg with unspecified severity: Secondary | ICD-10-CM | POA: Insufficient documentation

## 2012-03-03 DIAGNOSIS — E1169 Type 2 diabetes mellitus with other specified complication: Secondary | ICD-10-CM | POA: Insufficient documentation

## 2012-03-03 LAB — GLUCOSE, CAPILLARY: Glucose-Capillary: 132 mg/dL — ABNORMAL HIGH (ref 70–99)

## 2012-03-04 LAB — GLUCOSE, CAPILLARY: Glucose-Capillary: 180 mg/dL — ABNORMAL HIGH (ref 70–99)

## 2012-03-05 ENCOUNTER — Encounter (HOSPITAL_BASED_OUTPATIENT_CLINIC_OR_DEPARTMENT_OTHER): Payer: Medicare Other

## 2012-03-05 LAB — GLUCOSE, CAPILLARY
Glucose-Capillary: 165 mg/dL — ABNORMAL HIGH (ref 70–99)
Glucose-Capillary: 87 mg/dL (ref 70–99)

## 2012-03-06 LAB — GLUCOSE, CAPILLARY: Glucose-Capillary: 137 mg/dL — ABNORMAL HIGH (ref 70–99)

## 2012-03-07 ENCOUNTER — Encounter (HOSPITAL_COMMUNITY): Payer: Self-pay | Admitting: Pharmacy Technician

## 2012-03-10 LAB — GLUCOSE, CAPILLARY: Glucose-Capillary: 153 mg/dL — ABNORMAL HIGH (ref 70–99)

## 2012-03-11 ENCOUNTER — Other Ambulatory Visit: Payer: Self-pay | Admitting: Plastic Surgery

## 2012-03-11 ENCOUNTER — Encounter (HOSPITAL_COMMUNITY): Payer: Self-pay

## 2012-03-11 ENCOUNTER — Encounter (HOSPITAL_COMMUNITY)
Admission: RE | Admit: 2012-03-11 | Discharge: 2012-03-11 | Disposition: A | Discharge status: Home / Self Care | Payer: Medicare Other | Source: Ambulatory Visit | Attending: Plastic Surgery | Admitting: Plastic Surgery

## 2012-03-11 ENCOUNTER — Encounter (HOSPITAL_COMMUNITY): Payer: Self-pay | Admitting: Pharmacy Technician

## 2012-03-11 LAB — SURGICAL PCR SCREEN: Staphylococcus aureus: NEGATIVE

## 2012-03-11 LAB — BASIC METABOLIC PANEL
CO2: 27 mEq/L (ref 19–32)
Chloride: 103 mEq/L (ref 96–112)
Potassium: 4 mEq/L (ref 3.5–5.1)
Sodium: 138 mEq/L (ref 135–145)

## 2012-03-11 LAB — GLUCOSE, CAPILLARY
Glucose-Capillary: 139 mg/dL — ABNORMAL HIGH (ref 70–99)
Glucose-Capillary: 114 mg/dL — ABNORMAL HIGH (ref 70–99)

## 2012-03-11 LAB — CBC
HCT: 30.3 % — ABNORMAL LOW (ref 36.0–46.0)
Hemoglobin: 9.5 g/dL — ABNORMAL LOW (ref 12.0–15.0)
MCV: 79.5 fL (ref 78.0–100.0)
RBC: 3.81 MIL/uL — ABNORMAL LOW (ref 3.87–5.11)
WBC: 7 10*3/uL (ref 4.0–10.5)

## 2012-03-11 NOTE — Pre-Procedure Instructions (Signed)
20 [name]  03/11/2012   Your procedure is scheduled on:  Monday, April 15  Report to [facility] at 11:45 AM.  Call this number if you have problems the morning of surgery: [phone]   Remember:   Do not eat food:After Midnight.  May have clear liquids: up to 4 Hours before arrival.  Clear liquids include soda, tea, black coffee, apple or grape juice, broth.  Take these medicines the morning of surgery with A SIP OF WATER: Xanax if needed, Clonidine, Neurontin, Metoprolol, Sular, Prilosec, Tramadol if needed   Do not wear jewelry, make-up or nail polish.  Do not wear lotions, powders, or perfumes. You may wear deodorant.  Do not shave 48 hours prior to surgery.  Do not bring valuables to the hospital.  Contacts, dentures or bridgework may not be worn into surgery.  Leave suitcase in the car. After surgery it may be brought to your room.  For patients admitted to the hospital, checkout time is 11:00 AM the day of discharge.   Patients discharged the day of surgery will not be allowed to drive home.  Name and phone number of your driver: family  Special Instructions: CHG Shower Use Special Wash: 1/2 bottle night before surgery and 1/2 bottle morning of surgery.   Please read over the following fact sheets that you were given: Pain Booklet, Coughing and Deep Breathing and Surgical Site Infection Prevention

## 2012-03-11 NOTE — Progress Notes (Addendum)
Pt's PCP is Dr. Andi Devon. Requested records, last OV note.  Cardiologist Dr. Gery Pray at [facility]. Requested records, last OV, any cardiac test results.

## 2012-03-13 LAB — GLUCOSE, CAPILLARY
Glucose-Capillary: 150 mg/dL — ABNORMAL HIGH (ref 70–99)
Glucose-Capillary: 141 mg/dL — ABNORMAL HIGH (ref 70–99)

## 2012-03-13 NOTE — Consult Note (Addendum)
Anesthesia:  Patient is a 78 year old female with bilateral LE ulcers.  She has undergone two previous procedures since February 2013 at [facility].  She is now scheduled for left leg skin graft split thickness on 03/17/12.    History includes DM2, HTN, HLD, GERD, anxiety, PVD, mild MR/TR/AR by 06/2008 echo, non-smoker.  EKG from 01/18/12 showed SB at 56 bpm, moderate voltage criteria for LVH, nonspecific ST/T wave abnormality primarily in inferior leads.    CXR on 02/13/12 no acute findings.  Labs noted.  H/H 9.5/30.3.  Hgb has been 8.7-10.9 since December 2012, so overall it appears stable.  Will not order any additional labs at this time.  Her PCP is Dr. Andi Devon.  Her Cardiologist is Dr. Allyson Sabal [facility]).  I'll review records once available.  Addendum: 03/14/12 1320  I reviewed her last Cardiology notes.  She was seen by Dr. Allyson Sabal on 12/18/10 for follow-up of mild AI.    Echo on 06/29/08 showed low normal LV systolic function, mild-mod apical wall hypokinesis, mild MR/TR/AR.  Stress test on 06/29/08 showed normal myocardial perfusion, EF 62%.

## 2012-03-14 ENCOUNTER — Other Ambulatory Visit: Payer: Self-pay | Admitting: Plastic Surgery

## 2012-03-14 NOTE — Op Note (Signed)
[name], [name]                   ACCOUNT NO.:  MEDICAL RECORD NO.:  [id number]  LOCATION:                                 FACILITY:  PHYSICIAN:  Wayland Denis, DO      DATE OF BIRTH:  04/05/1931  DATE OF PROCEDURE:  02/18/2012 DATE OF DISCHARGE:                              OPERATIVE REPORT   PREOPERATIVE DIAGNOSIS:  Bilateral lower extremity diabetic ulcers.  POSTOPERATIVE DIAGNOSIS:  Bilateral lower extremity diabetic ulcers.  PROCEDURE:  Surgical preparation of bilateral lower extremities by irrigation of and debridement of skin followed by placement of ACell on both legs and a VAC on left leg.  ATTENDING:  Wayland Denis, DO  ANESTHESIA:  General.  INDICATION FOR PROCEDURE:  The patient is a 78 year old female who has been undergoing care of her bilateral lower extremity ulcers and presents for further treatment.  DESCRIPTION OF PROCEDURE:  The patient was taken to the operating room and placed on the operating room table in a supine position.  After her consent was signed, confirmed, and she was marked in the holding room. She was prepped and draped in usual sterile fashion.  A time-out was called.  All information was confirmed to be correct.  Copious amounts of antibiotic solution and normal saline was used to irrigate her bilateral lower extremities.  The skin edges were freshened using a 15- blade.  The bone was chiseled to get punctate bleeding.  Once that was done, the ACell powder and sheet was placed over the left leg wound. Adaptic was then placed with Hydrogel and a VAC sponge.  The VAC was set at 75 mmHg continuous pressure.  The ACell sheaths were then placed with the power on the right lower extremity ulcers after they were debrided at the skin edge with a 15-blade.  Hydrogel was applied with 4x4s and a Kerlix.  The patient tolerated the procedure well.  There were no complications.  She was awoken and taken to recovery room in  stable condition.     Wayland Denis, DO     CS/MEDQ  D:  02/18/2012  T:  02/18/2012  Job:  [id number]

## 2012-03-16 MED ORDER — CEFAZOLIN SODIUM 1-5 GM-% IV SOLN
1.0000 g | INTRAVENOUS | Status: AC
  Administered 2012-03-17: 1 g via INTRAVENOUS
  Filled 2012-03-16: qty 50

## 2012-03-17 ENCOUNTER — Encounter (HOSPITAL_COMMUNITY): Payer: Self-pay | Admitting: Plastic Surgery

## 2012-03-17 ENCOUNTER — Ambulatory Visit (HOSPITAL_COMMUNITY)
Admission: RE | Admit: 2012-03-17 | Discharge: 2012-03-17 | Disposition: A | Discharge status: Home / Self Care | Payer: Medicare Other | Source: Ambulatory Visit | Attending: Plastic Surgery | Admitting: Plastic Surgery

## 2012-03-17 ENCOUNTER — Ambulatory Visit (HOSPITAL_COMMUNITY): Payer: Medicare Other | Admitting: Vascular Surgery

## 2012-03-17 ENCOUNTER — Encounter (HOSPITAL_COMMUNITY): Payer: Self-pay | Admitting: Vascular Surgery

## 2012-03-17 ENCOUNTER — Encounter (HOSPITAL_COMMUNITY)
Admission: RE | Disposition: A | Discharge status: Home / Self Care | Payer: Self-pay | Source: Ambulatory Visit | Attending: Plastic Surgery

## 2012-03-17 DIAGNOSIS — I10 Essential (primary) hypertension: Secondary | ICD-10-CM | POA: Insufficient documentation

## 2012-03-17 DIAGNOSIS — I739 Peripheral vascular disease, unspecified: Secondary | ICD-10-CM | POA: Insufficient documentation

## 2012-03-17 DIAGNOSIS — E785 Hyperlipidemia, unspecified: Secondary | ICD-10-CM | POA: Insufficient documentation

## 2012-03-17 DIAGNOSIS — L97909 Non-pressure chronic ulcer of unspecified part of unspecified lower leg with unspecified severity: Secondary | ICD-10-CM | POA: Insufficient documentation

## 2012-03-17 DIAGNOSIS — Z01812 Encounter for preprocedural laboratory examination: Secondary | ICD-10-CM | POA: Insufficient documentation

## 2012-03-17 DIAGNOSIS — K219 Gastro-esophageal reflux disease without esophagitis: Secondary | ICD-10-CM | POA: Insufficient documentation

## 2012-03-17 DIAGNOSIS — E119 Type 2 diabetes mellitus without complications: Secondary | ICD-10-CM | POA: Insufficient documentation

## 2012-03-17 HISTORY — PX: SKIN SPLIT GRAFT: SHX444

## 2012-03-17 HISTORY — PX: APPLICATION OF WOUND VAC: SHX5189

## 2012-03-17 LAB — GLUCOSE, CAPILLARY
Glucose-Capillary: 100 mg/dL — ABNORMAL HIGH (ref 70–99)
Glucose-Capillary: 103 mg/dL — ABNORMAL HIGH (ref 70–99)
Glucose-Capillary: 91 mg/dL (ref 70–99)

## 2012-03-17 SURGERY — APPLICATION, GRAFT, SKIN, SPLIT-THICKNESS
Anesthesia: Regional | Site: Leg Lower | Laterality: Left | Wound class: Clean

## 2012-03-17 MED ORDER — PROPOFOL 10 MG/ML IV EMUL
INTRAVENOUS | Status: DC | PRN
  Administered 2012-03-17: 130 mg via INTRAVENOUS

## 2012-03-17 MED ORDER — CEFAZOLIN SODIUM 1-5 GM-% IV SOLN: 1.0000 g | INTRAVENOUS | Status: AC

## 2012-03-17 MED ORDER — ONDANSETRON HCL 4 MG/2ML IJ SOLN: 4.0000 mg | Freq: Four times a day (QID) | INTRAMUSCULAR | Status: DC | PRN

## 2012-03-17 MED ORDER — SODIUM CHLORIDE 0.9 % IR SOLN
Status: DC | PRN
  Administered 2012-03-17: 700 mL

## 2012-03-17 MED ORDER — MORPHINE SULFATE 2 MG/ML IJ SOLN: 0.0500 mg/kg | INTRAMUSCULAR | Status: DC | PRN

## 2012-03-17 MED ORDER — ONDANSETRON HCL 4 MG/2ML IJ SOLN: 4.0000 mg | Freq: Once | INTRAMUSCULAR | Status: DC | PRN

## 2012-03-17 MED ORDER — MIDAZOLAM HCL 5 MG/5ML IJ SOLN
INTRAMUSCULAR | Status: DC | PRN
  Administered 2012-03-17: 1 mg via INTRAVENOUS

## 2012-03-17 MED ORDER — HYDROMORPHONE HCL PF 1 MG/ML IJ SOLN
0.2500 mg | INTRAMUSCULAR | Status: DC | PRN
  Administered 2012-03-17: 0.25 mg via INTRAVENOUS
  Administered 2012-03-17 (×2): 0.5 mg via INTRAVENOUS

## 2012-03-17 MED ORDER — FENTANYL CITRATE 0.05 MG/ML IJ SOLN
INTRAMUSCULAR | Status: DC | PRN
  Administered 2012-03-17: 150 ug via INTRAVENOUS

## 2012-03-17 MED ORDER — BUPIVACAINE-EPINEPHRINE 0.25% -1:200000 IJ SOLN
INTRAMUSCULAR | Status: DC | PRN
  Administered 2012-03-17: 25 mL

## 2012-03-17 MED ORDER — DEXTROSE 5 % IV SOLN
INTRAVENOUS | Status: DC | PRN
  Administered 2012-03-17: 10:00:00 via INTRAVENOUS

## 2012-03-17 MED ORDER — LIDOCAINE HCL (CARDIAC) 20 MG/ML IV SOLN
INTRAVENOUS | Status: DC | PRN
  Administered 2012-03-17: 100 mg via INTRAVENOUS

## 2012-03-17 MED ORDER — HYDROMORPHONE HCL PF 1 MG/ML IJ SOLN: 0.2500 mg | INTRAMUSCULAR | Status: AC | PRN

## 2012-03-17 MED ORDER — LACTATED RINGERS IV SOLN
INTRAVENOUS | Status: DC | PRN
  Administered 2012-03-17: 10:00:00 via INTRAVENOUS

## 2012-03-17 SURGICAL SUPPLY — 57 items
ADH SKN CLS APL DERMABOND .7 (GAUZE/BANDAGES/DRESSINGS) ×2
APL SKNCLS STERI-STRIP NONHPOA (GAUZE/BANDAGES/DRESSINGS) ×2
BANDAGE ELASTIC 4 VELCRO ST LF (GAUZE/BANDAGES/DRESSINGS) IMPLANT
BANDAGE ELASTIC 6 VELCRO ST LF (GAUZE/BANDAGES/DRESSINGS) IMPLANT
BENZOIN TINCTURE PRP APPL 2/3 (GAUZE/BANDAGES/DRESSINGS) ×4 IMPLANT
BLADE DERMATOME SS (BLADE) ×2 IMPLANT
BLADE SURG ROTATE 9660 (MISCELLANEOUS) IMPLANT
CANISTER SUCTION 2500CC (MISCELLANEOUS) IMPLANT
CANISTER WOUND CARE 500ML ATS (WOUND CARE) IMPLANT
CLOTH BEACON ORANGE TIMEOUT ST (SAFETY) ×2 IMPLANT
COVER SURGICAL LIGHT HANDLE (MISCELLANEOUS) ×2 IMPLANT
DERMABOND ADVANCED (GAUZE/BANDAGES/DRESSINGS) ×2
DERMABOND ADVANCED .7 DNX12 (GAUZE/BANDAGES/DRESSINGS) IMPLANT
DERMACARRIERS GRAFT 1 TO 1.5 (DISPOSABLE) ×2
DRAPE INCISE IOBAN 66X45 STRL (DRAPES) ×2 IMPLANT
DRAPE ORTHO SPLIT 77X108 STRL (DRAPES) ×4
DRAPE PROXIMA HALF (DRAPES) ×2 IMPLANT
DRAPE SURG ORHT 6 SPLT 77X108 (DRAPES) ×2 IMPLANT
DRESSING TELFA 8X3 (GAUZE/BANDAGES/DRESSINGS) ×4 IMPLANT
DRSG ADAPTIC 3X8 NADH LF (GAUZE/BANDAGES/DRESSINGS) ×2 IMPLANT
DRSG OPSITE 6X11 MED (GAUZE/BANDAGES/DRESSINGS) ×1 IMPLANT
DRSG PAD ABDOMINAL 8X10 ST (GAUZE/BANDAGES/DRESSINGS) ×4 IMPLANT
DRSG VAC ATS LRG SENSATRAC (GAUZE/BANDAGES/DRESSINGS) IMPLANT
DRSG VAC ATS MED SENSATRAC (GAUZE/BANDAGES/DRESSINGS) ×2 IMPLANT
DRSG VAC ATS SM SENSATRAC (GAUZE/BANDAGES/DRESSINGS) IMPLANT
ELECT REM PT RETURN 9FT ADLT (ELECTROSURGICAL) ×2
ELECTRODE REM PT RTRN 9FT ADLT (ELECTROSURGICAL) IMPLANT
FILTER STRAW FLUID ASPIR (MISCELLANEOUS) ×2 IMPLANT
GAUZE XEROFORM 5X9 LF (GAUZE/BANDAGES/DRESSINGS) ×4 IMPLANT
GLOVE BIO SURGEON STRL SZ 6.5 (GLOVE) ×3 IMPLANT
GLOVE BIO SURGEON STRL SZ8 (GLOVE) ×1 IMPLANT
GLOVE ECLIPSE 6.5 STRL STRAW (GLOVE) ×1 IMPLANT
GOWN STRL NON-REIN LRG LVL3 (GOWN DISPOSABLE) ×4 IMPLANT
GRAFT DERMACARRIERS 1 TO 1.5 (DISPOSABLE) ×1 IMPLANT
HANDPIECE INTERPULSE COAX TIP (DISPOSABLE)
KIT BASIN OR (CUSTOM PROCEDURE TRAY) ×2 IMPLANT
KIT ROOM TURNOVER OR (KITS) ×2 IMPLANT
MICROMATRIX 500MG (Tissue) ×2 IMPLANT
NDL SPNL 18GX3.5 QUINCKE PK (NEEDLE) ×1 IMPLANT
NEEDLE SPNL 18GX3.5 QUINCKE PK (NEEDLE) ×2 IMPLANT
NS IRRIG 1000ML POUR BTL (IV SOLUTION) ×2 IMPLANT
PACK GENERAL/GYN (CUSTOM PROCEDURE TRAY) ×2 IMPLANT
PAD ARMBOARD 7.5X6 YLW CONV (MISCELLANEOUS) ×4 IMPLANT
PADDING CAST COTTON 6X4 STRL (CAST SUPPLIES) IMPLANT
SET HNDPC FAN SPRY TIP SCT (DISPOSABLE) IMPLANT
SOLUTION PARTIC MCRMTRX 500MG (Tissue) IMPLANT
SPONGE GAUZE 4X4 12PLY (GAUZE/BANDAGES/DRESSINGS) ×2 IMPLANT
STAPLER VISISTAT 35W (STAPLE) ×2 IMPLANT
SUT CHROMIC 4 0 PS 2 18 (SUTURE) ×4 IMPLANT
SUT VIC AB 5-0 P-3 18XBRD (SUTURE) ×2 IMPLANT
SUT VIC AB 5-0 P3 18 (SUTURE) ×4
SYR 3ML 25GX5/8 SAFETY (SYRINGE) ×2 IMPLANT
SYR CONTROL 10ML LL (SYRINGE) ×2 IMPLANT
TOWEL OR 17X24 6PK STRL BLUE (TOWEL DISPOSABLE) ×2 IMPLANT
TOWEL OR 17X26 10 PK STRL BLUE (TOWEL DISPOSABLE) ×2 IMPLANT
UNDERPAD 30X30 INCONTINENT (UNDERPADS AND DIAPERS) ×2 IMPLANT
WATER STERILE IRR 1000ML POUR (IV SOLUTION) IMPLANT

## 2012-03-17 NOTE — Anesthesia Postprocedure Evaluation (Signed)
Anesthesia Post Note  Patient: [name]  Procedure(s) Performed: Procedure(s) (LRB): SKIN GRAFT SPLIT THICKNESS (Left) APPLICATION OF WOUND VAC (Left)  Anesthesia type: general  Patient location: PACU  Post pain: Pain level controlled  Post assessment: Patient's Cardiovascular Status Stable  Last Vitals:  Filed Vitals:   03/17/12 1216  BP: 139/58  Pulse: 60  Temp:   Resp: 15    Post vital signs: Reviewed and stable  Level of consciousness: sedated  Complications: No apparent anesthesia complications

## 2012-03-17 NOTE — Op Note (Signed)
[name], [name]              ACCOUNT NO.:  [id number]  MEDICAL RECORD NO.:  [id number]  LOCATION:  [facility]                         FACILITY:  [facility]  PHYSICIAN:  Wayland Denis, DO      DATE OF BIRTH:  Apr 17, 1931  DATE OF PROCEDURE:  03/17/2012 DATE OF DISCHARGE:  03/17/2012                              OPERATIVE REPORT   PREOPERATIVE DIAGNOSIS:  Left leg wound.  POSTOPERATIVE DIAGNOSIS:  Left leg wound.  PROCEDURE:  Preparation of left leg wound with irrigation of the wound for placement of split- thickness skin graft, 15 x 5 cm; placement of ACell powder on donor site and on the graft site, and VAC placement on the graft site.  ATTENDING:  Wayland Denis, DO.  ANESTHESIA:  General.  INDICATION FOR PROCEDURE:  The patient is a 78 year old black female, who had a full-thickness wound on left lower extremity.  She has undergone irrigation and debridement with ACell placement and VAC dressings in the past and presents for coverage.  She was seen in holding.  Risks and complications were reviewed.  Consent was signed. Complications included bleeding, pain, risk of anesthesia, scarring, and need for repeat grafting.  DESCRIPTION OF PROCEDURE:  The patient was taken to the operating room and placed on the operating table in supine position.  General anesthesia was administered.  Once adequate, a time-out was called.  All information was confirmed to be correct.  The patient was prepped and draped in the usual sterile fashion.  Bupivacaine 0.25% with epinephrine was injected in the left thigh area for intraoperative hemostasis and postop pain management for the donor site.  The dermatome was set at 12 x 100 of an inch and was used to obtain a split-thickness skin graft on the left side.  Once the graft was obtained, the left lower leg was irrigated with copious amounts of normal saline and the ACell powder for a total of 500 g was sprinkled on both the donor site and on the  graft site.  The skin was then placed on the graft site and tacked at the edges with 5-0 Vicryl and then Dermabond, and a couple of VAC dressings were done in order to prevent hematoma or seroma buildup underneath the graft.  Adaptic was placed over the skin graft, followed by the VAC sponge.  The VAC was set at 100 mmHg continuous pressure and an excellent seal was obtained with the Ioban.  An Adaptic was then placed on the donor site and Steri-Strips at the edges. Surgicel was placed on the Adaptic with an OpSite and Ioban at the edges.  The patient was wrapped with Kerlix and Ace wrap and placed back in the immobilizing boot.  She tolerated the procedure well.  There were no complications.  She was awoken and taken to recovery room in stable condition.     Wayland Denis, DO     CS/MEDQ  D:  03/17/2012  T:  03/17/2012  Job:  [id number]

## 2012-03-17 NOTE — H&P (View-Only) (Signed)
[name] is an 78 y.o. female.   Chief Complaint: Left lower extremity ulcers HPI: The patient is a 78 yrs old bf with ulcerations of her lower extremities.  She has undergone local care and surgical management with great improvement.  She had surgery 2 weeks ago with irrigation and debridement followed by Acell placement. She was managed with the VAC.  She now presents for further surgical care.  Past Medical History  Diagnosis Date  . Diabetes mellitus   . Hypertension   . Hyperlipemia   . Phlebitis 11/30/11    RLE  . Skin ulcer(s) 11/30/11    BLE; draining  . GERD (gastroesophageal reflux disease)   . Arthritis   . Anxiety   . Peripheral vascular disease   . Neuromuscular disorder     numbness feet    Past Surgical History  Procedure Date  . Abdominal hysterectomy   . Cataract extraction w/ intraocular lens  implant, bilateral 1990's  . Blood clot 1990's    RLE; "cut out"  . Eye surgery   . Incision and drainage of wound 01/21/2012    Procedure: IRRIGATION AND DEBRIDEMENT WOUND;  Surgeon: Wayland Denis, DO;  Location: [facility];  Service: Plastics;  Laterality: Bilateral;  I&D bilateral lower extremities and placement of wound vac left lower leg    No family history on file. Social History:  reports that she has never smoked. She has never used smokeless tobacco. She reports that she does not drink alcohol or use illicit drugs.  Allergies: No Known Allergies  No current facility-administered medications on file as of .   Medications Prior to Admission  Medication Sig Dispense Refill  . ALPRAZolam (XANAX) 0.25 MG tablet Take 0.25 mg by mouth daily.        Marland Kitchen aspirin 81 MG chewable tablet Chew 81 mg by mouth daily.        . calcium carbonate (OS-CAL) 600 MG TABS Take 600 mg by mouth daily.        . cloNIDine (CATAPRES) 0.3 MG tablet Take 0.3 mg by mouth 3 (three) times daily.        Marland Kitchen gabapentin (NEURONTIN) 300 MG capsule Take 300 mg by mouth 2 (two)  times daily.        Marland Kitchen lisinopril (PRINIVIL,ZESTRIL) 20 MG tablet Take 20 mg by mouth 2 (two) times daily.        . metFORMIN (GLUCOPHAGE) 1000 MG tablet Take 1,000 mg by mouth 2 (two) times daily with a meal.        . metoprolol (TOPROL-XL) 100 MG 24 hr tablet Take 100 mg by mouth 2 (two) times daily.        . nisoldipine (SULAR) 34 MG 24 hr tablet Take 34 mg by mouth daily.        Marland Kitchen omeprazole (PRILOSEC) 40 MG capsule Take 40 mg by mouth 2 (two) times daily.        . traMADol (ULTRAM) 50 MG tablet Take 50 mg by mouth every 6 (six) hours as needed. Maximum dose= 8 tablets per day. For pain       . triamcinolone (KENALOG) 0.025 % ointment Apply topically 2 (two) times daily.  30 g  0    No results found for this or any previous visit (from the past 48 hour(s)). No results found.  Review of Systems  Constitutional: Negative.   HENT: Negative.   Eyes: Negative.   Respiratory: Negative.   Cardiovascular: Negative.   Gastrointestinal:  Negative.   Genitourinary: Negative.   Musculoskeletal: Negative.   Skin: Negative.   Neurological: Negative.   Endo/Heme/Allergies: Negative.   Psychiatric/Behavioral: Negative.     There were no vitals taken for this visit. Physical Exam  Constitutional: She appears well-developed and well-nourished.  HENT:  Head: Normocephalic.  Eyes: EOM are normal. Pupils are equal, round, and reactive to light.  Neck: Normal range of motion.  Cardiovascular: Normal rate.   Respiratory: Effort normal. No respiratory distress. She has no wheezes.  GI: Soft. She exhibits no distension.  Neurological: She is alert.  Psychiatric: She has a normal mood and affect. Her behavior is normal. Judgment and thought content normal.     Assessment/Plan Plan for irrigation and debridement of any non viable tissue with placement of integra, Acell and the VAC.  The patient was seen in holding with risks and complications reviewed.  No change in history from last  visit.  SANGER,Celia Friedland 02/18/2012, 9:20 AM

## 2012-03-17 NOTE — Brief Op Note (Signed)
03/17/2012  10:54 AM  PATIENT:  [name]  78 y.o. female  PRE-OPERATIVE DIAGNOSIS:  LEFT LEG WOUND  POST-OPERATIVE DIAGNOSIS:  LEFT LEG WOUND  PROCEDURE:  Procedure(s) (LRB): LEFT LEG SKIN GRAFT SPLIT THICKNESS, APPLICATION OF ACELL AND WOUND VAC (14 x 5 cm)  SURGEON:  Surgeon(s) and Role:    * Mohmed Farver Sanger, DO - Primary  PHYSICIAN ASSISTANT:   ASSISTANTS: none   ANESTHESIA:   local and general  EBL:  Total I/O In: 250 [I.V.:250] Out: 30 [Blood:30]  BLOOD ADMINISTERED:none  DRAINS: none   LOCAL MEDICATIONS USED:  MARCAINE     SPECIMEN:  No Specimen  DISPOSITION OF SPECIMEN:  N/A  COUNTS:  YES  TOURNIQUET:  * No tourniquets in log *  DICTATION: dictated  PLAN OF CARE: Discharge to home after PACU  PATIENT DISPOSITION:  PACU - hemodynamically stable.   Delay start of Pharmacological VTE agent (>24hrs) due to surgical blood loss or risk of bleeding: no

## 2012-03-17 NOTE — Interval H&P Note (Signed)
History and Physical Interval Note:  03/17/2012 9:07 AM  [name]  has presented today for surgery, with the diagnosis of LEFT LEG WOUND  The various methods of treatment have been discussed with the patient and family. After consideration of risks, benefits and other options for treatment, the patient has consented to  Procedure(s) (LRB): SKIN GRAFT SPLIT THICKNESS (Left) as a surgical intervention .  The patients' history has been reviewed, patient examined, no change in status, stable for surgery.  I have reviewed the patients' chart and labs.  Questions were answered to the patient's satisfaction.     SANGER,Prophet Renwick  There is no change in the history or physical for this patient.  She has an ulcer of the left lower extremity.  We will plan for a STSG placement and the VAC with Acell on the donor site.  She is in agreement with risks and complications reviewed and include bleeding, pain, scar and risk of the anesthesia.  Consent is confirmed.

## 2012-03-17 NOTE — Discharge Instructions (Signed)
VAC to 100 mmHg continuous pressure DO NOT change VAC or thigh dressing May reinforce dressing on thigh if needed but do not remove Keep splint in place on left leg

## 2012-03-17 NOTE — Transfer of Care (Signed)
Immediate Anesthesia Transfer of Care Note  Patient: [name]  Procedure(s) Performed: Procedure(s) (LRB): SKIN GRAFT SPLIT THICKNESS (Left) APPLICATION OF WOUND VAC (Left)  Patient Location: PACU  Anesthesia Type: General  Level of Consciousness: awake and alert   Airway & Oxygen Therapy: Patient Spontanous Breathing and Patient connected to nasal cannula oxygen  Post-op Assessment: Report given to PACU RN, Post -op Vital signs reviewed and stable and Patient moving all extremities  Post vital signs: Reviewed and stable  Complications: No apparent anesthesia complications

## 2012-03-17 NOTE — Anesthesia Preprocedure Evaluation (Addendum)
Anesthesia Evaluation  Patient identified by MRN, date of birth, ID band Patient awake    Reviewed: Allergy & Precautions, H&P , NPO status , Patient's Chart, lab work & pertinent test results, reviewed documented beta blocker date and time   Airway Mallampati: II TM Distance: >3 FB Neck ROM: Full    Dental  (+) Edentulous Upper and Edentulous Lower   Pulmonary          Cardiovascular hypertension, Pt. on medications and Pt. on home beta blockers     Neuro/Psych    GI/Hepatic GERD-  Medicated and Controlled,  Endo/Other  Diabetes mellitus-, Type 2, Oral Hypoglycemic Agents  Renal/GU      Musculoskeletal   Abdominal   Peds  Hematology   Anesthesia Other Findings   Reproductive/Obstetrics                           Anesthesia Physical Anesthesia Plan  ASA: II  Anesthesia Plan: General   Post-op Pain Management:    Induction: Intravenous  Airway Management Planned: LMA  Additional Equipment:   Intra-op Plan:   Post-operative Plan: Extubation in OR  Informed Consent: I have reviewed the patients History and Physical, chart, labs and discussed the procedure including the risks, benefits and alternatives for the proposed anesthesia with the patient or authorized representative who has indicated his/her understanding and acceptance.   Dental advisory given  Plan Discussed with: CRNA and Surgeon  Anesthesia Plan Comments:        Anesthesia Quick Evaluation

## 2012-03-17 NOTE — Preoperative (Signed)
Beta Blockers   Reason not to administer Beta Blockers:Beta Blocker @ 0715 03/17/2012

## 2012-03-18 ENCOUNTER — Encounter (HOSPITAL_COMMUNITY): Payer: Self-pay | Admitting: Plastic Surgery

## 2012-03-27 LAB — GLUCOSE, CAPILLARY
Glucose-Capillary: 203 mg/dL — ABNORMAL HIGH (ref 70–99)
Glucose-Capillary: 143 mg/dL — ABNORMAL HIGH (ref 70–99)

## 2012-03-28 LAB — GLUCOSE, CAPILLARY: Glucose-Capillary: 259 mg/dL — ABNORMAL HIGH (ref 70–99)

## 2012-03-31 LAB — GLUCOSE, CAPILLARY: Glucose-Capillary: 189 mg/dL — ABNORMAL HIGH (ref 70–99)

## 2012-04-01 LAB — GLUCOSE, CAPILLARY
Glucose-Capillary: 106 mg/dL — ABNORMAL HIGH (ref 70–99)
Glucose-Capillary: 112 mg/dL — ABNORMAL HIGH (ref 70–99)
Glucose-Capillary: 113 mg/dL — ABNORMAL HIGH (ref 70–99)

## 2012-04-02 ENCOUNTER — Encounter (HOSPITAL_BASED_OUTPATIENT_CLINIC_OR_DEPARTMENT_OTHER): Payer: Medicare Other | Attending: Plastic Surgery

## 2012-04-02 DIAGNOSIS — E1169 Type 2 diabetes mellitus with other specified complication: Secondary | ICD-10-CM | POA: Insufficient documentation

## 2012-04-02 DIAGNOSIS — L97809 Non-pressure chronic ulcer of other part of unspecified lower leg with unspecified severity: Secondary | ICD-10-CM | POA: Insufficient documentation

## 2012-04-02 DIAGNOSIS — L97509 Non-pressure chronic ulcer of other part of unspecified foot with unspecified severity: Secondary | ICD-10-CM | POA: Insufficient documentation

## 2012-04-02 NOTE — Progress Notes (Signed)
[facility]  NAMEAERITH, CANAL              ACCOUNT NO.:  [id number]  MEDICAL RECORD NO.:  [id number]      DATE OF BIRTH:  [birth year]  PHYSICIAN:  Wayland Denis, DO       VISIT DATE:  04/02/2012                                  OFFICE VISIT   Ms. Vohs is a 78 year old female here for her postop followup.  After ACell and skin graft placement to her left leg at the wound site and at the donor site, ACell placement.  She had exposure of her bone.  The ACell was able to cover.  The skin graft had a good take, but there are some areas on the edge and two areas in the middle of that had full take of the graft site.  The underlying area looks to be healthy.  No sign of infection.  There has been no change in her medications or social history.  She is continuing with the hyperbaric oxygen chamber for treatment and for a dressing, we will do a Silvercel with a Kerlix and an Ace wrap on the lower leg and Xeroform on the upper leg with no change for the next week on the upper leg on the donor part.  We will see her back in 1 week.     Wayland Denis, DO     CS/MEDQ  D:  04/02/2012  T:  04/02/2012  Job:  [id number]

## 2012-04-03 LAB — GLUCOSE, CAPILLARY: Glucose-Capillary: 135 mg/dL — ABNORMAL HIGH (ref 70–99)

## 2012-04-04 LAB — GLUCOSE, CAPILLARY: Glucose-Capillary: 120 mg/dL — ABNORMAL HIGH (ref 70–99)

## 2012-04-07 LAB — GLUCOSE, CAPILLARY: Glucose-Capillary: 120 mg/dL — ABNORMAL HIGH (ref 70–99)

## 2012-04-08 LAB — GLUCOSE, CAPILLARY
Glucose-Capillary: 122 mg/dL — ABNORMAL HIGH (ref 70–99)
Glucose-Capillary: 139 mg/dL — ABNORMAL HIGH (ref 70–99)

## 2012-04-09 LAB — GLUCOSE, CAPILLARY: Glucose-Capillary: 145 mg/dL — ABNORMAL HIGH (ref 70–99)

## 2012-04-10 LAB — GLUCOSE, CAPILLARY: Glucose-Capillary: 115 mg/dL — ABNORMAL HIGH (ref 70–99)

## 2012-04-11 LAB — GLUCOSE, CAPILLARY
Glucose-Capillary: 104 mg/dL — ABNORMAL HIGH (ref 70–99)
Glucose-Capillary: 127 mg/dL — ABNORMAL HIGH (ref 70–99)

## 2012-04-15 LAB — GLUCOSE, CAPILLARY
Glucose-Capillary: 181 mg/dL — ABNORMAL HIGH (ref 70–99)
Glucose-Capillary: 161 mg/dL — ABNORMAL HIGH (ref 70–99)

## 2012-04-16 LAB — GLUCOSE, CAPILLARY
Glucose-Capillary: 131 mg/dL — ABNORMAL HIGH (ref 70–99)
Glucose-Capillary: 109 mg/dL — ABNORMAL HIGH (ref 70–99)

## 2012-04-17 LAB — GLUCOSE, CAPILLARY
Glucose-Capillary: 148 mg/dL — ABNORMAL HIGH (ref 70–99)
Glucose-Capillary: 108 mg/dL — ABNORMAL HIGH (ref 70–99)

## 2012-04-18 LAB — GLUCOSE, CAPILLARY: Glucose-Capillary: 124 mg/dL — ABNORMAL HIGH (ref 70–99)

## 2012-04-18 NOTE — Progress Notes (Signed)
[facility]  NAME:  [name], [name]                   ACCOUNT NO.:  MEDICAL RECORD NO.:  [id number]      DATE OF BIRTH:  09-Jun-1931  PHYSICIAN:  Wayland Denis, DO       VISIT DATE:  04/16/2012                                  OFFICE VISIT   [name] is a 78 year old female who is here for followup on her left lower extremity diabetic foot ulcer and a Wagner 3 ulcer.  She underwent debridements with ACell placement, VAC, and then a skin graft.  She has over 50% healing of all the wounds with approximately 20% failed grafts within a 3-week period of the split-thickness skin graft placement.  Due to the nature of her wounds and her comorbidities, continuing hyperbaric oxygen treatment would be essential for her healing.  No other change in her medications or social history.  On exam, she is alert and oriented, cooperative, not in any acute distress.  Her pupils are equal.  Her breathing is unlabored.  Pulses are regular.  We will continue with Silvercel, Profore Lite, and lotion to the left thigh.  We will have her follow up in 1 week and as mentioned above.  I recommend continuing with the HBO treatment.     Wayland Denis, DO     CS/MEDQ  D:  04/16/2012  T:  04/16/2012  Job:  [id number]

## 2012-04-22 LAB — GLUCOSE, CAPILLARY
Glucose-Capillary: 159 mg/dL — ABNORMAL HIGH (ref 70–99)
Glucose-Capillary: 154 mg/dL — ABNORMAL HIGH (ref 70–99)

## 2012-04-23 LAB — GLUCOSE, CAPILLARY
Glucose-Capillary: 176 mg/dL — ABNORMAL HIGH (ref 70–99)
Glucose-Capillary: 112 mg/dL — ABNORMAL HIGH (ref 70–99)

## 2012-04-24 LAB — GLUCOSE, CAPILLARY: Glucose-Capillary: 177 mg/dL — ABNORMAL HIGH (ref 70–99)

## 2012-04-25 LAB — GLUCOSE, CAPILLARY: Glucose-Capillary: 121 mg/dL — ABNORMAL HIGH (ref 70–99)

## 2012-04-29 LAB — GLUCOSE, CAPILLARY: Glucose-Capillary: 159 mg/dL — ABNORMAL HIGH (ref 70–99)

## 2012-05-02 LAB — GLUCOSE, CAPILLARY: Glucose-Capillary: 154 mg/dL — ABNORMAL HIGH (ref 70–99)

## 2012-05-05 ENCOUNTER — Encounter (HOSPITAL_BASED_OUTPATIENT_CLINIC_OR_DEPARTMENT_OTHER): Payer: Medicare Other | Attending: Plastic Surgery

## 2012-05-05 DIAGNOSIS — L97809 Non-pressure chronic ulcer of other part of unspecified lower leg with unspecified severity: Secondary | ICD-10-CM | POA: Insufficient documentation

## 2012-05-07 ENCOUNTER — Encounter (HOSPITAL_BASED_OUTPATIENT_CLINIC_OR_DEPARTMENT_OTHER): Payer: Medicare Other

## 2012-05-26 NOTE — Progress Notes (Signed)
[facility]  NAME:  [name], [name]                   ACCOUNT NO.:  MEDICAL RECORD NO.:  [id number]      DATE OF BIRTH:  12/24/1930  PHYSICIAN:  Wayland Denis, DO       VISIT DATE:  05/26/2012                                  OFFICE VISIT   HISTORY OF PRESENT ILLNESS:  The patient is a 78 year old female, who is here for followup on her bilateral lower extremity ulcers.  The left leg has completely healed and looks wonderful.  She has a little bit of edema, but overall much improved from even 1 week ago and the wound is healed.  The right leg has 1 dime size superficial ulcer.  The measurements are noted in the chart and we are using Silvercel on that area.  There has been no change in her medication or social history.  PHYSICAL EXAMINATION:  GENERAL:  She is alert, oriented, cooperative, in no acute distress.  She is pleasant.  HEENT:  Pupils are equal.  Extraocular muscles are intact.  NECK:  No cervical lymphadenopathy.  LUNGS:  Breathing is unlabored.  HEART:  Regular.  ABDOMEN:  Soft.  ASSESSMENT AND PLAN:  The wound is as described above.  We will continue with lotion and stockings on the left leg and Silvercel on the right, and followup as needed.     Wayland Denis, DO     CS/MEDQ  D:  05/26/2012  T:  05/26/2012  Job:  [id number]

## 2012-06-03 ENCOUNTER — Encounter (HOSPITAL_COMMUNITY): Payer: Self-pay | Admitting: Emergency Medicine

## 2012-06-03 ENCOUNTER — Inpatient Hospital Stay (HOSPITAL_COMMUNITY): Payer: Medicare Other

## 2012-06-03 ENCOUNTER — Emergency Department (HOSPITAL_COMMUNITY): Payer: Medicare Other

## 2012-06-03 ENCOUNTER — Inpatient Hospital Stay (HOSPITAL_COMMUNITY)
Admission: EM | Admit: 2012-06-03 | Discharge: 2012-06-13 | DRG: 871 | Disposition: A | Discharge status: Home Health | Payer: Medicare Other | Attending: Internal Medicine | Admitting: Internal Medicine

## 2012-06-03 DIAGNOSIS — I739 Peripheral vascular disease, unspecified: Secondary | ICD-10-CM | POA: Diagnosis present

## 2012-06-03 DIAGNOSIS — F411 Generalized anxiety disorder: Secondary | ICD-10-CM | POA: Diagnosis present

## 2012-06-03 DIAGNOSIS — Z9289 Personal history of other medical treatment: Secondary | ICD-10-CM

## 2012-06-03 DIAGNOSIS — R569 Unspecified convulsions: Secondary | ICD-10-CM | POA: Diagnosis present

## 2012-06-03 DIAGNOSIS — D649 Anemia, unspecified: Secondary | ICD-10-CM | POA: Diagnosis present

## 2012-06-03 DIAGNOSIS — I214 Non-ST elevation (NSTEMI) myocardial infarction: Secondary | ICD-10-CM | POA: Diagnosis present

## 2012-06-03 DIAGNOSIS — E1149 Type 2 diabetes mellitus with other diabetic neurological complication: Secondary | ICD-10-CM | POA: Diagnosis present

## 2012-06-03 DIAGNOSIS — B351 Tinea unguium: Secondary | ICD-10-CM | POA: Diagnosis present

## 2012-06-03 DIAGNOSIS — E785 Hyperlipidemia, unspecified: Secondary | ICD-10-CM | POA: Diagnosis present

## 2012-06-03 DIAGNOSIS — L98499 Non-pressure chronic ulcer of skin of other sites with unspecified severity: Secondary | ICD-10-CM | POA: Diagnosis present

## 2012-06-03 DIAGNOSIS — I83009 Varicose veins of unspecified lower extremity with ulcer of unspecified site: Secondary | ICD-10-CM | POA: Diagnosis present

## 2012-06-03 DIAGNOSIS — I429 Cardiomyopathy, unspecified: Secondary | ICD-10-CM | POA: Diagnosis present

## 2012-06-03 DIAGNOSIS — I519 Heart disease, unspecified: Secondary | ICD-10-CM | POA: Diagnosis present

## 2012-06-03 DIAGNOSIS — Z6828 Body mass index (BMI) 28.0-28.9, adult: Secondary | ICD-10-CM

## 2012-06-03 DIAGNOSIS — I509 Heart failure, unspecified: Secondary | ICD-10-CM | POA: Diagnosis present

## 2012-06-03 DIAGNOSIS — I059 Rheumatic mitral valve disease, unspecified: Secondary | ICD-10-CM | POA: Diagnosis present

## 2012-06-03 DIAGNOSIS — F419 Anxiety disorder, unspecified: Secondary | ICD-10-CM

## 2012-06-03 DIAGNOSIS — R4182 Altered mental status, unspecified: Secondary | ICD-10-CM | POA: Diagnosis present

## 2012-06-03 DIAGNOSIS — A419 Sepsis, unspecified organism: Principal | ICD-10-CM | POA: Diagnosis present

## 2012-06-03 DIAGNOSIS — Z7982 Long term (current) use of aspirin: Secondary | ICD-10-CM

## 2012-06-03 DIAGNOSIS — I471 Supraventricular tachycardia, unspecified: Secondary | ICD-10-CM | POA: Diagnosis not present

## 2012-06-03 DIAGNOSIS — J189 Pneumonia, unspecified organism: Secondary | ICD-10-CM | POA: Diagnosis present

## 2012-06-03 DIAGNOSIS — E1142 Type 2 diabetes mellitus with diabetic polyneuropathy: Secondary | ICD-10-CM | POA: Diagnosis present

## 2012-06-03 DIAGNOSIS — I5043 Acute on chronic combined systolic (congestive) and diastolic (congestive) heart failure: Secondary | ICD-10-CM | POA: Diagnosis not present

## 2012-06-03 DIAGNOSIS — J9600 Acute respiratory failure, unspecified whether with hypoxia or hypercapnia: Secondary | ICD-10-CM | POA: Diagnosis present

## 2012-06-03 DIAGNOSIS — L97809 Non-pressure chronic ulcer of other part of unspecified lower leg with unspecified severity: Secondary | ICD-10-CM | POA: Diagnosis present

## 2012-06-03 DIAGNOSIS — I10 Essential (primary) hypertension: Secondary | ICD-10-CM | POA: Diagnosis present

## 2012-06-03 DIAGNOSIS — E669 Obesity, unspecified: Secondary | ICD-10-CM | POA: Diagnosis present

## 2012-06-03 DIAGNOSIS — Z79899 Other long term (current) drug therapy: Secondary | ICD-10-CM

## 2012-06-03 DIAGNOSIS — I428 Other cardiomyopathies: Secondary | ICD-10-CM | POA: Diagnosis present

## 2012-06-03 DIAGNOSIS — G909 Disorder of the autonomic nervous system, unspecified: Secondary | ICD-10-CM

## 2012-06-03 DIAGNOSIS — I340 Nonrheumatic mitral (valve) insufficiency: Secondary | ICD-10-CM | POA: Diagnosis present

## 2012-06-03 DIAGNOSIS — I2510 Atherosclerotic heart disease of native coronary artery without angina pectoris: Secondary | ICD-10-CM | POA: Diagnosis present

## 2012-06-03 DIAGNOSIS — K219 Gastro-esophageal reflux disease without esophagitis: Secondary | ICD-10-CM | POA: Diagnosis present

## 2012-06-03 HISTORY — DX: Unspecified convulsions: R56.9

## 2012-06-03 HISTORY — DX: Heart disease, unspecified: I51.9

## 2012-06-03 HISTORY — DX: Rheumatic tricuspid insufficiency: I07.1

## 2012-06-03 HISTORY — DX: Cardiomyopathy, unspecified: I42.9

## 2012-06-03 HISTORY — DX: Personal history of other medical treatment: Z92.89

## 2012-06-03 HISTORY — DX: Acute on chronic combined systolic (congestive) and diastolic (congestive) heart failure: I50.43

## 2012-06-03 LAB — GLUCOSE, CAPILLARY
Glucose-Capillary: 233 mg/dL — ABNORMAL HIGH (ref 70–99)
Glucose-Capillary: 105 mg/dL — ABNORMAL HIGH (ref 70–99)

## 2012-06-03 LAB — CBC
HCT: 34.1 % — ABNORMAL LOW (ref 36.0–46.0)
Hemoglobin: 10.9 g/dL — ABNORMAL LOW (ref 12.0–15.0)
MCH: 25.3 pg — ABNORMAL LOW (ref 26.0–34.0)
MCH: 25.3 pg — ABNORMAL LOW (ref 26.0–34.0)
MCHC: 32.2 g/dL (ref 30.0–36.0)
MCHC: 32 g/dL (ref 30.0–36.0)
MCV: 79.1 fL (ref 78.0–100.0)
RDW: 13.9 % (ref 11.5–15.5)

## 2012-06-03 LAB — URINALYSIS, ROUTINE W REFLEX MICROSCOPIC
Glucose, UA: NEGATIVE mg/dL
Ketones, ur: NEGATIVE mg/dL
Nitrite: NEGATIVE
Specific Gravity, Urine: 1.011 (ref 1.005–1.030)
pH: 6 (ref 5.0–8.0)

## 2012-06-03 LAB — DIFFERENTIAL
Basophils Absolute: 0 10*3/uL (ref 0.0–0.1)
Basophils Relative: 0 % (ref 0–1)
Eosinophils Absolute: 0 10*3/uL (ref 0.0–0.7)
Monocytes Absolute: 0.5 10*3/uL (ref 0.1–1.0)
Neutro Abs: 9.9 10*3/uL — ABNORMAL HIGH (ref 1.7–7.7)
Neutrophils Relative %: 87 % — ABNORMAL HIGH (ref 43–77)

## 2012-06-03 LAB — PROTEIN AND GLUCOSE, CSF: Glucose, CSF: 98 mg/dL — ABNORMAL HIGH (ref 43–76)

## 2012-06-03 LAB — URINE MICROSCOPIC-ADD ON

## 2012-06-03 LAB — PROTIME-INR
INR: 1.22 (ref 0.00–1.49)
Prothrombin Time: 15.7 seconds — ABNORMAL HIGH (ref 11.6–15.2)

## 2012-06-03 LAB — CSF CELL COUNT WITH DIFFERENTIAL
RBC Count, CSF: 345 /mm3 — ABNORMAL HIGH
WBC, CSF: 3 /mm3 (ref 0–5)

## 2012-06-03 LAB — POCT I-STAT 3, ART BLOOD GAS (G3+)
Acid-base deficit: 6 mmol/L — ABNORMAL HIGH (ref 0.0–2.0)
Bicarbonate: 23.3 mEq/L (ref 20.0–24.0)
Bicarbonate: 21.8 mEq/L (ref 20.0–24.0)
O2 Saturation: 87 %
O2 Saturation: 100 %
TCO2: 23 mmol/L (ref 0–100)
pCO2 arterial: 38.2 mmHg (ref 35.0–45.0)
pO2, Arterial: 53 mmHg — ABNORMAL LOW (ref 80.0–100.0)
pO2, Arterial: 210 mmHg — ABNORMAL HIGH (ref 80.0–100.0)

## 2012-06-03 LAB — CARDIAC PANEL(CRET KIN+CKTOT+MB+TROPI)
CK, MB: 6.1 ng/mL (ref 0.3–4.0)
CK, MB: 6.4 ng/mL (ref 0.3–4.0)
Troponin I: 1.15 ng/mL (ref <0.30)

## 2012-06-03 LAB — LACTIC ACID, PLASMA: Lactic Acid, Venous: 3.4 mmol/L — ABNORMAL HIGH (ref 0.5–2.2)

## 2012-06-03 LAB — GRAM STAIN

## 2012-06-03 LAB — COMPREHENSIVE METABOLIC PANEL
AST: 17 U/L (ref 0–37)
Albumin: 3.5 g/dL (ref 3.5–5.2)
Chloride: 102 mEq/L (ref 96–112)
Creatinine, Ser: 0.91 mg/dL (ref 0.50–1.10)
Potassium: 4 mEq/L (ref 3.5–5.1)
Total Bilirubin: 0.4 mg/dL (ref 0.3–1.2)
Total Protein: 6.9 g/dL (ref 6.0–8.3)

## 2012-06-03 LAB — POCT I-STAT TROPONIN I: Troponin i, poc: 0.05 ng/mL (ref 0.00–0.08)

## 2012-06-03 LAB — LIPASE, BLOOD: Lipase: 20 U/L (ref 11–59)

## 2012-06-03 LAB — CREATININE, SERUM: Creatinine, Ser: 0.78 mg/dL (ref 0.50–1.10)

## 2012-06-03 LAB — PRO B NATRIURETIC PEPTIDE: Pro B Natriuretic peptide (BNP): 1421 pg/mL — ABNORMAL HIGH (ref 0–450)

## 2012-06-03 LAB — STREP PNEUMONIAE URINARY ANTIGEN: Strep Pneumo Urinary Antigen: NEGATIVE

## 2012-06-03 MED ORDER — FENTANYL CITRATE 0.05 MG/ML IJ SOLN
INTRAMUSCULAR | Status: AC
  Filled 2012-06-03: qty 2

## 2012-06-03 MED ORDER — SODIUM CHLORIDE 0.9 % IV BOLUS (SEPSIS)
1000.0000 mL | Freq: Once | INTRAVENOUS | Status: AC
  Administered 2012-06-03: 1000 mL via INTRAVENOUS

## 2012-06-03 MED ORDER — SODIUM CHLORIDE 0.9 % IV SOLN
INTRAVENOUS | Status: DC
  Administered 2012-06-03: 20 mL/h via INTRAVENOUS

## 2012-06-03 MED ORDER — MIDAZOLAM HCL 2 MG/2ML IJ SOLN
1.0000 mg | Freq: Once | INTRAMUSCULAR | Status: AC
  Administered 2012-06-03: 1 mg via INTRAVENOUS

## 2012-06-03 MED ORDER — ONDANSETRON HCL 4 MG/2ML IJ SOLN: 4.0000 mg | Freq: Three times a day (TID) | INTRAMUSCULAR | Status: AC | PRN

## 2012-06-03 MED ORDER — ETOMIDATE 2 MG/ML IV SOLN
20.0000 mg | Freq: Once | INTRAVENOUS | Status: AC
  Administered 2012-06-03: 20 mg via INTRAVENOUS

## 2012-06-03 MED ORDER — SODIUM CHLORIDE 0.9 % IV SOLN: 250.0000 mL | INTRAVENOUS | Status: DC | PRN

## 2012-06-03 MED ORDER — DEXTROSE 5 % IV SOLN
1.0000 g | Freq: Once | INTRAVENOUS | Status: AC
  Administered 2012-06-03: 1 g via INTRAVENOUS
  Filled 2012-06-03: qty 10

## 2012-06-03 MED ORDER — DEXTROSE 5 % IV SOLN
1.0000 g | Freq: Two times a day (BID) | INTRAVENOUS | Status: DC
  Administered 2012-06-03 – 2012-06-08 (×10): 1 g via INTRAVENOUS
  Filled 2012-06-03 (×12): qty 10

## 2012-06-03 MED ORDER — MIDAZOLAM HCL 5 MG/ML IJ SOLN
2.0000 mg | Freq: Once | INTRAMUSCULAR | Status: AC
  Administered 2012-06-03: 2 mg via INTRAVENOUS

## 2012-06-03 MED ORDER — ASPIRIN 300 MG RE SUPP
300.0000 mg | RECTAL | Status: AC
  Filled 2012-06-03: qty 1

## 2012-06-03 MED ORDER — LORAZEPAM 2 MG/ML IJ SOLN
INTRAMUSCULAR | Status: AC
  Administered 2012-06-03: 1 mg via INTRAVENOUS
  Filled 2012-06-03: qty 1

## 2012-06-03 MED ORDER — LORAZEPAM 2 MG/ML IJ SOLN
1.0000 mg | Freq: Once | INTRAMUSCULAR | Status: AC
  Administered 2012-06-03: 1 mg via INTRAVENOUS
  Filled 2012-06-03: qty 1

## 2012-06-03 MED ORDER — BIOTENE DRY MOUTH MT LIQD
15.0000 mL | Freq: Four times a day (QID) | OROMUCOSAL | Status: DC
  Administered 2012-06-04 – 2012-06-10 (×19): 15 mL via OROMUCOSAL

## 2012-06-03 MED ORDER — DEXTROSE 5 % IV SOLN
500.0000 mg | INTRAVENOUS | Status: DC
  Administered 2012-06-04 – 2012-06-06 (×3): 500 mg via INTRAVENOUS
  Filled 2012-06-03 (×4): qty 500

## 2012-06-03 MED ORDER — ETOMIDATE 2 MG/ML IV SOLN
INTRAVENOUS | Status: AC
  Administered 2012-06-03: 20 mg via INTRAVENOUS
  Filled 2012-06-03: qty 20

## 2012-06-03 MED ORDER — ACETAMINOPHEN 325 MG PO TABS
975.0000 mg | ORAL_TABLET | Freq: Once | ORAL | Status: AC
  Administered 2012-06-03: 975 mg via ORAL
  Filled 2012-06-03: qty 3

## 2012-06-03 MED ORDER — DEXTROSE 5 % IV SOLN
500.0000 mg | Freq: Once | INTRAVENOUS | Status: AC
  Administered 2012-06-03: 500 mg via INTRAVENOUS
  Filled 2012-06-03: qty 500

## 2012-06-03 MED ORDER — SODIUM CHLORIDE 0.9 % IV SOLN
1000.0000 mg | Freq: Once | INTRAVENOUS | Status: AC
  Administered 2012-06-03: 1000 mg via INTRAVENOUS
  Filled 2012-06-03 (×2): qty 10

## 2012-06-03 MED ORDER — LIDOCAINE HCL (CARDIAC) 20 MG/ML IV SOLN
INTRAVENOUS | Status: AC
  Filled 2012-06-03: qty 5

## 2012-06-03 MED ORDER — CHLORHEXIDINE GLUCONATE 0.12 % MT SOLN
15.0000 mL | Freq: Two times a day (BID) | OROMUCOSAL | Status: DC
  Administered 2012-06-03 – 2012-06-10 (×13): 15 mL via OROMUCOSAL
  Filled 2012-06-03 (×15): qty 15

## 2012-06-03 MED ORDER — MIDAZOLAM HCL 2 MG/2ML IJ SOLN
INTRAMUSCULAR | Status: AC
  Filled 2012-06-03: qty 2

## 2012-06-03 MED ORDER — FENTANYL CITRATE 0.05 MG/ML IJ SOLN
100.0000 ug | Freq: Once | INTRAMUSCULAR | Status: AC
Start: 2012-06-03 — End: 2012-06-03
  Administered 2012-06-03: 100 ug via INTRAVENOUS

## 2012-06-03 MED ORDER — ROCURONIUM BROMIDE 50 MG/5ML IV SOLN
INTRAVENOUS | Status: AC
  Filled 2012-06-03: qty 2

## 2012-06-03 MED ORDER — SUCCINYLCHOLINE CHLORIDE 20 MG/ML IJ SOLN
INTRAMUSCULAR | Status: AC
  Administered 2012-06-03: 100 mg via INTRAVENOUS
  Filled 2012-06-03: qty 10

## 2012-06-03 MED ORDER — ASPIRIN 81 MG PO CHEW
324.0000 mg | CHEWABLE_TABLET | ORAL | Status: AC
Start: 2012-06-03 — End: 2012-06-03
  Filled 2012-06-03: qty 3
  Filled 2012-06-03: qty 1

## 2012-06-03 MED ORDER — PANTOPRAZOLE SODIUM 40 MG IV SOLR
40.0000 mg | INTRAVENOUS | Status: DC
  Administered 2012-06-03 – 2012-06-04 (×2): 40 mg via INTRAVENOUS
  Filled 2012-06-03 (×2): qty 40

## 2012-06-03 MED ORDER — FENTANYL CITRATE 0.05 MG/ML IJ SOLN
INTRAMUSCULAR | Status: AC
  Administered 2012-06-03: 100 ug via INTRAVENOUS
  Filled 2012-06-03: qty 2

## 2012-06-03 MED ORDER — HEPARIN SODIUM (PORCINE) 5000 UNIT/ML IJ SOLN
5000.0000 [IU] | Freq: Three times a day (TID) | INTRAMUSCULAR | Status: DC
  Filled 2012-06-03 (×3): qty 1

## 2012-06-03 MED ORDER — PROPOFOL 10 MG/ML IV EMUL: 5.0000 ug/kg/min | INTRAVENOUS | Status: DC

## 2012-06-03 MED ORDER — LIDOCAINE HCL (PF) 1 % IJ SOLN
INTRAMUSCULAR | Status: AC
  Filled 2012-06-03: qty 10

## 2012-06-03 MED ORDER — DEXTROSE 10 % IV SOLN: INTRAVENOUS | Status: DC | PRN

## 2012-06-03 MED ORDER — FENTANYL CITRATE 0.05 MG/ML IJ SOLN
100.0000 ug | Freq: Once | INTRAMUSCULAR | Status: AC
  Administered 2012-06-03: 100 ug via INTRAVENOUS
  Filled 2012-06-03: qty 2

## 2012-06-03 MED ORDER — ASPIRIN 81 MG PO CHEW
324.0000 mg | CHEWABLE_TABLET | ORAL | Status: AC
  Administered 2012-06-03: 324 mg via ORAL

## 2012-06-03 MED ORDER — FENTANYL CITRATE 0.05 MG/ML IJ SOLN
25.0000 ug | INTRAMUSCULAR | Status: DC | PRN
  Administered 2012-06-04: 50 ug via INTRAVENOUS
  Administered 2012-06-06: 25 ug via INTRAVENOUS
  Filled 2012-06-03 (×3): qty 2

## 2012-06-03 MED ORDER — ACETAMINOPHEN 160 MG/5ML PO SOLN
650.0000 mg | Freq: Four times a day (QID) | ORAL | Status: DC | PRN
  Administered 2012-06-03: 650 mg
  Filled 2012-06-03 (×2): qty 20.3

## 2012-06-03 MED ORDER — DEXTROSE 5 % IV SOLN
1.0000 g | INTRAVENOUS | Status: DC
  Filled 2012-06-03: qty 10

## 2012-06-03 MED ORDER — SUCCINYLCHOLINE CHLORIDE 20 MG/ML IJ SOLN
100.0000 mg | Freq: Once | INTRAMUSCULAR | Status: AC
  Administered 2012-06-03: 100 mg via INTRAVENOUS

## 2012-06-03 MED ORDER — PROPOFOL 10 MG/ML IV EMUL
5.0000 ug/kg/min | INTRAVENOUS | Status: DC
  Administered 2012-06-03: 5 ug/kg/min via INTRAVENOUS
  Administered 2012-06-04: 20 ug/kg/min via INTRAVENOUS
  Filled 2012-06-03 (×2): qty 20

## 2012-06-03 MED ORDER — SODIUM CHLORIDE 0.9 % IV SOLN
500.0000 mg | Freq: Two times a day (BID) | INTRAVENOUS | Status: DC
  Administered 2012-06-04 – 2012-06-06 (×5): 500 mg via INTRAVENOUS
  Filled 2012-06-03 (×6): qty 5

## 2012-06-03 MED ORDER — HEPARIN (PORCINE) IN NACL 100-0.45 UNIT/ML-% IJ SOLN
1100.0000 [IU]/h | INTRAMUSCULAR | Status: DC
  Administered 2012-06-03: 1000 [IU]/h via INTRAVENOUS
  Administered 2012-06-06: 1100 [IU]/h via INTRAVENOUS
  Administered 2012-06-06: 1000 [IU]/h via INTRAVENOUS
  Filled 2012-06-03 (×9): qty 250

## 2012-06-03 MED ORDER — FENTANYL CITRATE 0.05 MG/ML IJ SOLN
50.0000 ug | Freq: Once | INTRAMUSCULAR | Status: AC
  Administered 2012-06-03: 50 ug via INTRAVENOUS

## 2012-06-03 MED ORDER — INSULIN ASPART 100 UNIT/ML ~~LOC~~ SOLN
0.0000 [IU] | SUBCUTANEOUS | Status: DC
  Administered 2012-06-04 – 2012-06-05 (×4): 1 [IU] via SUBCUTANEOUS

## 2012-06-03 NOTE — Progress Notes (Signed)
Critical Lab  Values:  CKMB 6.1 Troponin 1.22  Dr. Tyson Alias aware. Ordered  Ok to give aspirin before LP. Administering aspirin now.

## 2012-06-03 NOTE — ED Provider Notes (Addendum)
History     CSN: [id number]  Arrival date & time 06/03/12  0865   First MD Initiated Contact with Patient 06/03/12 [phone]      Chief Complaint  Patient presents with  . Emesis    (Consider location/radiation/quality/duration/timing/severity/associated sxs/prior treatment) HPI Patient is a 78 year old female who presents today complaining of productive cough as well as fever and vomiting over the past 2 days. Patient is gotten gradually worse. She is febrile to 103 on presentation. Patient is satting 87% on room air and normally has no home oxygen requirement. She has no history of heart failure or COPD but is a diabetic. She reports that her fingersticks have been fine. Patient has not been hospitalized in the past 90 days. She does note that her cough is productive of pinkish sputum. She has no lower extremity swelling but does have recent extensive treatment for bilateral lower extremity wounds which have been healed. Patient was released approximately a week and a half ago from the wound center for this. She's on no active antibiotics. Patient denies any abdominal pain, urinary symptoms, diarrhea, chest pain, or other concerning symptoms.  Patient has not taken any of her medications this morning. She denies any sick contacts. There are no other associated or modifying factors. Past Medical History  Diagnosis Date  . Diabetes mellitus   . Hypertension   . Hyperlipemia   . Phlebitis 11/30/11    RLE  . Skin ulcer(s) 11/30/11    BLE; draining  . GERD (gastroesophageal reflux disease)   . Arthritis   . Anxiety   . Peripheral vascular disease   . Neuromuscular disorder     numbness feet    Past Surgical History  Procedure Date  . Abdominal hysterectomy   . Cataract extraction w/ intraocular lens  implant, bilateral 1990's  . Blood clot 1990's    RLE; "cut out"  . Eye surgery   . Incision and drainage of wound 01/21/2012    Procedure: IRRIGATION AND DEBRIDEMENT WOUND;  Surgeon:  Wayland Denis, DO;  Location: [facility];  Service: Plastics;  Laterality: Bilateral;  I&D bilateral lower extremities and placement of wound vac left lower leg  . Skin split graft 02/18/2012    Procedure: SKIN GRAFT SPLIT THICKNESS;  Surgeon: Wayland Denis, DO;  Location: [facility];  Service: Plastics;  Laterality: Bilateral;  irrigation bilateral lower extremities, ACell placement bilateral lower legs, Wound Vac placement left lower leg  . Skin split graft 03/17/2012    Procedure: SKIN GRAFT SPLIT THICKNESS;  Surgeon: Wayland Denis, DO;  Location: [facility];  Service: Plastics;  Laterality: Left;  LEFT LEG SKIN GRAFT WITH SPLIT THICKNESS   . Application of wound vac 03/17/2012    Procedure: APPLICATION OF WOUND VAC;  Surgeon: Wayland Denis, DO;  Location: [facility];  Service: Plastics;  Laterality: Left;  open ulcer    History reviewed. No pertinent family history.  History  Substance Use Topics  . Smoking status: Never Smoker   . Smokeless tobacco: Never Used  . Alcohol Use: No     11/30/11 "used to drink; not much; don't drink anymore"    OB History    Grav Para Term Preterm Abortions TAB SAB Ect Mult Living                  Review of Systems  Constitutional: Positive for fever, chills and fatigue.  HENT: Negative.   Eyes: Negative.   Respiratory: Positive for cough and shortness of  breath.   Cardiovascular: Negative.   Gastrointestinal: Positive for vomiting. Negative for abdominal pain.  Genitourinary: Negative.   Musculoskeletal: Negative.   Skin: Negative.   Neurological: Negative.   Hematological: Negative.   Psychiatric/Behavioral: Negative.   All other systems reviewed and are negative.    Allergies  Review of patient's allergies indicates no known allergies.  Home Medications   Current Outpatient Rx  Name Route Sig Dispense Refill  . RISAQUAD PO CAPS Oral Take 1 capsule by mouth daily.    Marland Kitchen ALPRAZOLAM 0.25 MG PO TABS Oral Take 0.25 mg  by mouth daily as needed. For anxiety    . ASPIRIN 81 MG PO CHEW Oral Chew 81 mg by mouth daily.      Marland Kitchen CALCIUM CARBONATE-VITAMIN D 500-200 MG-UNIT PO TABS Oral Take 1 tablet by mouth daily.    Marland Kitchen CLONIDINE HCL 0.3 MG PO TABS Oral Take 0.3 mg by mouth 3 (three) times daily.     Marland Kitchen GABAPENTIN 300 MG PO CAPS Oral Take 300 mg by mouth 2 (two) times daily.     Marland Kitchen LISINOPRIL 20 MG PO TABS Oral Take 20 mg by mouth 2 (two) times daily.      Marland Kitchen METFORMIN HCL 1000 MG PO TABS Oral Take 1,000 mg by mouth 2 (two) times daily with a meal.      . METOPROLOL SUCCINATE ER 100 MG PO TB24 Oral Take 100 mg by mouth 2 (two) times daily.     . CENTRUM SILVER PO Oral Take 1 tablet by mouth daily.    Marland Kitchen NISOLDIPINE ER 34 MG PO TB24 Oral Take 34 mg by mouth daily.     Marland Kitchen OMEPRAZOLE 40 MG PO CPDR Oral Take 40 mg by mouth 2 (two) times daily.     . TRAMADOL HCL 50 MG PO TABS Oral Take 50 mg by mouth every 6 (six) hours as needed. Maximum dose= 8 tablets per day. For pain    . TRIAMCINOLONE ACETONIDE 0.025 % EX OINT Topical Apply 1 application topically 2 (two) times daily. To legs    . VITAMIN C 500 MG PO TABS Oral Take 500 mg by mouth daily.    Marland Kitchen ZINC 50 MG PO TABS Oral Take 1 tablet by mouth daily.      BP 160/98  Pulse 107  Temp 103.1 F (39.5 C) (Oral)  Resp 20  SpO2 87%  Physical Exam  Nursing note and vitals reviewed. GEN: Well-developed, well-nourished elderly female in no distress HEENT: Atraumatic, normocephalic.  EYES: PERRLA BL, no scleral icterus. NECK: Trachea midline, no meningismus CV: regular rate and rhythm. No murmurs, rubs, or gallops PULM: No respiratory distress.  Patient is satting in the mid 90s on 4 L per nasal cannula patient is diminished in the left lung base with rales appreciated bilaterally at the bases. There no wheezes appreciated.  GI: soft, non-tender. No guarding, rebound, or tenderness. + bowel sounds  GU: deferred Neuro: cranial nerves grossly 2-12 intact, no abnormalities of  strength or sensation, A and O x 3 MSK: Patient moves all 4 extremities symmetrically. No lower extremity edema appreciated. Patient has scarring from recent wounds over the bilateral anterior shins. These do not appear cellulitic. Skin: No rashes petechiae, purpura, or jaundice. Prior wounds over the bilateral anterior shins are well-healed. Psych: no abnormality of mood   ED Course  Procedures (including critical care time)  CRITICAL CARE Performed by: Cyndra Numbers   Total critical care time: 30 minutes  Critical care  time was exclusive of separately billable procedures and treating other patients.  Critical care was necessary to treat or prevent imminent or life-threatening deterioration.  Critical care was time spent personally by me on the following activities: development of treatment plan with patient and/or surrogate as well as nursing, discussions with consultants, evaluation of patient's response to treatment, examination of patient, obtaining history from patient or surrogate, ordering and performing treatments and interventions, ordering and review of laboratory studies, ordering and review of radiographic studies, pulse oximetry and re-evaluation of patient's condition.  INTUBATION Performed by: Cyndra Numbers  Required items: required blood products, implants, devices, and special equipment available Patient identity confirmed: provided demographic data and hospital-assigned identification number Time out: Immediately prior to procedure a "time out" was called to verify the correct patient, procedure, equipment, support staff and site/side marked as required.  Indications: unresponsiveness, hypoxia  Intubation method: direct laryngoscopy  Preoxygenation: BVM  Sedatives: 20 Etomidate Paralytic: 100 Succinylcholine  Tube Size: 7.5 cuffed  Post-procedure assessment: chest rise and ETCO2 monitor Breath sounds: equal and absent over the epigastrium Tube secured with:  ETT holder Chest x-ray interpreted by radiologist and me.   Chest x-ray findings: endotracheal tube in appropriate position  Patient tolerated the procedure well with no immediate complications.    Indication: shortness of breath Please note this EKG was reviewed extemporaneously by myself.   Date: 06/03/2012  Rate: 81  Rhythm: normal sinus rhythm  QRS Axis: normal  Intervals: normal  ST/T Wave abnormalities: nonspecific ST changes and nonspecific T wave changes  Conduction Disutrbances:none  Narrative Interpretation: Compared to previous patient has T-wave inversions noted in V5 and V6 with 1 mm of depression noted in the ST segment in V6 and less than one noted in V5. There is T-wave flattening in leads 1 and aVL compared to previous EKG.  Old EKG Reviewed: changes noted  Indication: ALOC Please note this EKG was reviewed extemporaneously by myself.   Date: 06/03/2012  Rate: 102  Rhythm: sinus tachycardia  QRS Axis: right  Intervals: normal  ST/T Wave abnormalities: nonspecific T wave changes and ST depressions laterally  Conduction Disutrbances:none  Narrative Interpretation: Now with new ST depression in V5, worsened depression in v6  Old EKG Reviewed: changes noted         Labs Reviewed  CBC - Abnormal; Notable for the following:    WBC 11.4 (*)     Hemoglobin 10.9 (*)     HCT 33.8 (*)     MCH 25.3 (*)     All other components within normal limits  DIFFERENTIAL - Abnormal; Notable for the following:    Neutrophils Relative 87 (*)     Neutro Abs 9.9 (*)     Lymphocytes Relative 8 (*)     All other components within normal limits  COMPREHENSIVE METABOLIC PANEL - Abnormal; Notable for the following:    Glucose, Bld 139 (*)     GFR calc non Af Amer 59 (*)     GFR calc Af Amer 68 (*)     All other components within normal limits  LACTIC ACID, PLASMA - Abnormal; Notable for the following:    Lactic Acid, Venous 3.4 (*)     All other components within normal  limits  PRO B NATRIURETIC PEPTIDE - Abnormal; Notable for the following:    Pro B Natriuretic peptide (BNP) 1421.0 (*)     All other components within normal limits  GLUCOSE, CAPILLARY - Abnormal; Notable for the following:  Glucose-Capillary 130 (*)     All other components within normal limits  LIPASE, BLOOD  POCT I-STAT TROPONIN I  CULTURE, BLOOD (ROUTINE X 2)  CULTURE, BLOOD (ROUTINE X 2)  URINALYSIS, ROUTINE W REFLEX MICROSCOPIC  URINE CULTURE  BLOOD GAS, ARTERIAL   Dg Chest Portable 1 View  06/03/2012  *RADIOLOGY REPORT*  Clinical Data: Cough.  Shortness of breath.  Nausea and vomiting.  PORTABLE CHEST - 1 VIEW 06/03/2012 0723 hours:  Comparison: Two-view chest x-ray 02/13/2012.  Findings: Interval development of airspace consolidation throughout much of the left lung, greatest in the lower lobe.  Minimal patchy airspace opacities at the right lung base.  Right upper lung remains clear.  Cardiac silhouette upper normal in size to slightly enlarged but stable.  Pulmonary vascularity normal.  IMPRESSION: Pneumonia throughout much of the left lung, worst in the left lower lobe.  Minimal patchy pneumonia at the right lung base.  Original Report Authenticated By: Arnell Sieving, M.D.     1. CAP (community acquired pneumonia)   2. Sepsis       MDM  Patient was evaluated by myself. Based on evaluation patient was concerning for possible sepsis. She was placed on 4 L nasal cannula upon arrival an O2 sat improved to the mid 90s. Patient was not hypotensive but was very febrile. She was given a gram of Tylenol and written for a liter of normal saline IV bolus while her workup was initiated. Patient did have obvious left lower lobe pneumonia on chest x-ray which was reviewed by myself. Workup including lactate, blood cultures, and full sepsis last tarry been ordered. Given the patient has not been hospitalized for the past 90 days Rocephin and azithromycin were ordered. Patient did have  mild nonspecific EKG changes with T-wave inversions and one lead with 1 mm ST depression. I think this is likely secondary to strain placed by her hypoxia. O2 requirement has been decreased to 2 L at this time. The patient did have rales appreciated her primary process appears to be infectious. A second liter of IV fluid will be ordered. Patient did not have peripheral edema or JVD to suggest heart failure.  9:15 AM Patient may the hospitalist service. She not have any signs of renal compromise. Patient had an elevated BNP with a troponin within normal limits. Elevation of BNP is likely related to stress from infection. Patient had a second liter of IV fluid he had this time. She has received Rocephin and azithromycin. She will be admitted to the step down unit for further management. Lactate was elevated to 3.4 and patient does carry a diagnosis of sepsis as well as community acquired pneumonia today.  10:01 AM Patient at sitting with family at 9:35 when she suddenly became unresponsive.  Eyes were open and pinpoint. No pain meds had been given.  Patient was staring into space and had shaking of her lower extremities.  Sats were 57% per nursing.  Patient was unresponsive and bagged up to 100%.  FSBG was 230.  Patient was intubated with RSI.  There was good color change on EtCO2 and BL breath sounds auscultated.  CXR was repeated.  Film with good tube placement and increased interstitial edema.  Patient had EKG repeated with new ST depression in V5 and worsened in V6.  Head CT ordered.  Patient with TNI ordered.  Critical care notified.  ABG to be repeated after CT.       Cyndra Numbers, MD 06/03/12 [id number]  Cyndra Numbers,  MD 06/03/12 1015

## 2012-06-03 NOTE — Progress Notes (Signed)
Pharmacy paged neurology regarding when LP would be done. Pharm D would like to start Heparin gtt as soon as possible given 2 + tropin results.

## 2012-06-03 NOTE — H&P (Signed)
Name: [name] MRN: [id number] DOB: 1931/10/22    LOS: 0  Referring Provider:  EDP Reason for Referral:   Respiratory failure  PULMONARY / CRITICAL CARE MEDICINE  HPI:  This a 78 year old African American female admitted for respiratory failure, pneumonia, altered mental status. This patient was initially brought into the emergency room with pneumonia and hypoxemia and fever. While being evaluated for admission the patient became suddenly unresponsive and placed on mechanical vent support. Initial CT of the head is reported as unremarkable. Patient has a prior history of peripheral vascular disease and heart murmur but no cardiac history known. There is ST segment changes laterally on this patient. The patient's troponin levels were not elevated. The patient is admitted in critical care for sepsis pneumonia community acquired.  Past Medical History  Diagnosis Date  . Diabetes mellitus   . Hypertension   . Hyperlipemia   . Phlebitis 11/30/11    RLE  . Skin ulcer(s) 11/30/11    BLE; draining  . GERD (gastroesophageal reflux disease)   . Arthritis   . Anxiety   . Peripheral vascular disease   . Neuromuscular disorder     numbness feet   Past Surgical History  Procedure Date  . Abdominal hysterectomy   . Cataract extraction w/ intraocular lens  implant, bilateral 1990's  . Blood clot 1990's    RLE; "cut out"  . Eye surgery   . Incision and drainage of wound 01/21/2012    Procedure: IRRIGATION AND DEBRIDEMENT WOUND;  Surgeon: Wayland Denis, DO;  Location: [facility];  Service: Plastics;  Laterality: Bilateral;  I&D bilateral lower extremities and placement of wound vac left lower leg  . Skin split graft 02/18/2012    Procedure: SKIN GRAFT SPLIT THICKNESS;  Surgeon: Wayland Denis, DO;  Location: [facility];  Service: Plastics;  Laterality: Bilateral;  irrigation bilateral lower extremities, ACell placement bilateral lower legs, Wound Vac placement  left lower leg  . Skin split graft 03/17/2012    Procedure: SKIN GRAFT SPLIT THICKNESS;  Surgeon: Wayland Denis, DO;  Location: [facility] OR;  Service: Plastics;  Laterality: Left;  LEFT LEG SKIN GRAFT WITH SPLIT THICKNESS   . Application of wound vac 03/17/2012    Procedure: APPLICATION OF WOUND VAC;  Surgeon: Wayland Denis, DO;  Location: [facility] OR;  Service: Plastics;  Laterality: Left;  open ulcer   Prior to Admission medications   Medication Sig Start Date End Date Taking? Authorizing Provider  acidophilus (RISAQUAD) CAPS Take 1 capsule by mouth daily.   Yes Historical Provider, MD  ALPRAZolam (XANAX) 0.25 MG tablet Take 0.25 mg by mouth daily as needed. For anxiety   Yes Historical Provider, MD  aspirin 81 MG chewable tablet Chew 81 mg by mouth daily.     Yes Historical Provider, MD  calcium-vitamin D (OSCAL WITH D) 500-200 MG-UNIT per tablet Take 1 tablet by mouth daily.   Yes Historical Provider, MD  cloNIDine (CATAPRES) 0.3 MG tablet Take 0.3 mg by mouth 3 (three) times daily.    Yes Historical Provider, MD  gabapentin (NEURONTIN) 300 MG capsule Take 300 mg by mouth 2 (two) times daily.    Yes Historical Provider, MD  lisinopril (PRINIVIL,ZESTRIL) 20 MG tablet Take 20 mg by mouth 2 (two) times daily.     Yes Historical Provider, MD  metFORMIN (GLUCOPHAGE) 1000 MG tablet Take 1,000 mg by mouth 2 (two) times daily with a meal.     Yes Historical Provider, MD  metoprolol (TOPROL-XL)  100 MG 24 hr tablet Take 100 mg by mouth 2 (two) times daily.    Yes Historical Provider, MD  Multiple Vitamins-Minerals (CENTRUM SILVER PO) Take 1 tablet by mouth daily.   Yes Historical Provider, MD  nisoldipine (SULAR) 34 MG 24 hr tablet Take 34 mg by mouth daily.    Yes Historical Provider, MD  omeprazole (PRILOSEC) 40 MG capsule Take 40 mg by mouth 2 (two) times daily.    Yes Historical Provider, MD  traMADol (ULTRAM) 50 MG tablet Take 50 mg by mouth every 6 (six) hours as needed. Maximum dose= 8 tablets per day. For  pain   Yes Historical Provider, MD  triamcinolone (KENALOG) 0.025 % ointment Apply 1 application topically 2 (two) times daily. To legs   Yes Historical Provider, MD  vitamin C (ASCORBIC ACID) 500 MG tablet Take 500 mg by mouth daily.   Yes Historical Provider, MD  Zinc 50 MG TABS Take 1 tablet by mouth daily.   Yes Historical Provider, MD   Allergies No Known Allergies  Family History History reviewed. No pertinent family history. Social History  reports that she has never smoked. She has never used smokeless tobacco. She reports that she does not drink alcohol or use illicit drugs.  Review Of Systems:  11 point review of system reviewed  Brief patient description:  78 year old African female with sepsis pneumonia and respiratory failure and altered mental status  Events Since Admission: While being  evaluated for admission the patient was intubated in the emergency room for altered mental status  Current Status:  Vital Signs: Temp:  [100.5 F (38.1 C)-103.1 F (39.5 C)] 100.5 F (38.1 C) (07/02 0907) Pulse Rate:  [78-107] 79  (07/02 0845) Resp:  [20-29] 29  (07/02 0845) BP: (139-160)/(67-98) 139/68 mmHg (07/02 0845) SpO2:  [87 %-98 %] 95 % (07/02 0845) FiO2 (%):  [100 %] 100 % (07/02 1006)  Physical Examination: General:  Unresponsive and intubated Neuro:  Unresponsive and does not move to command HEENT:  Endotracheal tube in place, mucous membranes dry Neck:  Neck supple with jugular venous suspension not noted Cardiovascular:  Regular rate and rhythm normal S1-S2 no S3 or S4 Lungs:  Bilateral rales and poor breath sounds Abdomen:  Soft nontender bowel sounds hypoactive, no hepatosplenomegaly Musculoskeletal:  Full range of motion Skin:  Clear  Principal Problem:  *CAP (community acquired pneumonia) Active Problems:  DM type 2 with diabetic peripheral neuropathy  Respiratory failure, acute  Altered mental status  Sepsis   ASSESSMENT AND PLAN  PULMONARY  Lab  06/03/12 1047 06/03/12 0915  PHART 7.214* 7.392  PCO2ART 54.0* 38.2  PO2ART 210.0* 53.0*  HCO3 21.8 23.3  O2SAT 100.0 87.0   Ventilator Settings: Vent Mode:  [-] PRVC FiO2 (%):  [100 %] 100 % Set Rate:  [18 bmp] 18 bmp Vt Set:  [430 mL-500 mL] 500 mL PEEP:  [5 cmH20] 5 cmH20 CXR:  Left lower lobe infiltrate and pulmonary edema ETT:  7/2>>  A:  Left lower lobe can be acquired pneumonia with acute respiratory failure P:   Full ventilatory support Bronchodilator therapy Adjust vent settings Followup blood gases  CARDIOVASCULAR  Lab 06/03/12 0737 06/03/12 0732  TROPONINI -- --  LATICACIDVEN -- 3.4*  PROBNP 1421.0* --   ECG:  T wave inversion in V3-V6 Lines:  CVL pending  A: T wave inversion with mild elevation in troponin need to rule out ischemia P:  Heparin subcutaneous Full dose aspirin Cardiology consultation Place central line  RENAL  Lab 06/03/12 0731  NA 138  K 4.0  CL 102  CO2 25  BUN 16  CREATININE 0.91  CALCIUM 9.4  MG --  PHOS --   Intake/Output    None    Foley:  7/2  A:  No active renal issues  P:   Monitor labs  GASTROINTESTINAL  Lab 06/03/12 0731  AST 17  ALT 6  ALKPHOS 62  BILITOT 0.4  PROT 6.9  ALBUMIN 3.5    A:   No active gastrointestinal issues  P:   Monitor labs  HEMATOLOGIC  Lab 06/03/12 0731  HGB 10.9*  HCT 33.8*  PLT 268  INR --  APTT --   A:  Mild anemia  P:  Her labs no transfusion was hemoglobin less than 7  INFECTIOUS  Lab 06/03/12 0731  WBC 11.4*  PROCALCITON --   Cultures: Blood cultures x2 7/2>>> Resp c/s 7/2>>> UC 7/2>>>  Antibiotics: Rocephin 7/2 (CAP)>> Azithromycin 7/2 (CAP)>>  A:  Community acquired pneumonia coverage indicated  P:   Rocephin and azithromycin ordered  ENDOCRINE  Lab 06/03/12 0941 06/03/12 0733  GLUCAP 233* 130*   A:  Diabetes type 2   P:   ICU hyperglycemia protocol  NEUROLOGIC  A:  Altered mental status unclear etiology  P:   Obtain neurology  consultation  BEST PRACTICE / DISPOSITION Level of Care:  ICU level of care  Primary Service:  PCCM service  Consultants:  Cardiology, neurology  Code Status:  Full  Diet:  N.p.o.  DVT Px:  Heparin subcutaneous  GI Px:  IV protonix Skin Integrity:  Good  Social / Family:  Family updated completely on 06/03/2012  Shan Levans, M.D. Pulmonary and Critical Care Medicine [facility] Pager: [phone]  06/03/2012, 10:59 AM

## 2012-06-03 NOTE — Progress Notes (Signed)
Responded to urgent page to be with family of pt. who had reported to ED for shortness of breath and  later developed respiratory failure and was intubated.  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  Large  Family and much staff support.  Pt. Clergy involved. Provide emotional and spiritual support to pt and family.  Promoted information sharing between staff and family.   Passed on to unit Chaplain for follow up support.   06/03/12 1300  Clinical Encounter Type  Visited With Patient and family together;Health care provider  Visit Type Spiritual support;ED  Spiritual Encounters  Spiritual Needs Prayer;Emotional  Stress Factors  Family Stress Factors Exhausted

## 2012-06-03 NOTE — Procedures (Signed)
Central Venous Catheter Insertion Procedure Note [name] [id number] 1931-12-03  Procedure: Insertion of Central Venous Catheter Indications: Assessment of intravascular volume, Drug and/or fluid administration and Frequent blood sampling  Procedure Details Consent: Risks of procedure as well as the alternatives and risks of each were explained to the (patient/caregiver).  Consent for procedure obtained. Time Out: Verified patient identification, verified procedure, site/side was marked, verified correct patient position, special equipment/implants available, medications/allergies/relevent history reviewed, required imaging and test results available.  Performed  Maximum sterile technique was used including antiseptics, cap, gloves, gown, hand hygiene, mask and sheet. Skin prep: Chlorhexidine; local anesthetic administered A antimicrobial bonded/coated triple lumen catheter was placed in the right subclavian vein using the Seldinger technique.  Evaluation Blood flow good Complications: No apparent complications Patient did tolerate procedure well. Chest X-ray ordered to verify placement.  CXR: pending.  Attempted R IJ placement -wire would not pass.  Large IJ on [place] assessment.    Procedure performed under direct supervision of Dr. Delford Field and with ultrasound guidance.

## 2012-06-03 NOTE — ED Notes (Signed)
Pt resting in bed, sister at bedside. Pt maintaining oxygen sats at 95% on 2 L.

## 2012-06-03 NOTE — Procedures (Signed)
Lumbar Puncture Procedure Note  Pre-operative Diagnosis: r/o meningitis  Post-operative Diagnosis: clear csf, r/o enceph, meningitis  Indications: Diagnostic  Procedure Details   Consent: Informed consent was obtained. Risks of the procedure were discussed including: infection, bleeding, pain and headache.  The patient was positioned under sterile conditions. Chlroaprep solution and sterile drapes were utilized. A spinal needle was inserted at the L3 - L4 interspace.  Spinal fluid was obtained and sent to the laboratory.  Findings 8mL of clear spinal fluid was obtained. Opening Pressure: low , not measuredcm H2O pressure. Closing Pressure: low cm H2O pressure.  Complications:  None; patient tolerated the procedure well.        Condition: stable  Plan Bed rest for 6 hours.  Mcarthur Rossetti. Tyson Alias, MD, FACP Pgr: [phone] [facility]

## 2012-06-03 NOTE — Progress Notes (Signed)
Discussed administration of aspirin and heparin with Pharmacist M. Patel. She ordered to hold aspirin until after the LP. She stated that the RN is to start the heparin 4 hours after the LP.

## 2012-06-03 NOTE — ED Notes (Signed)
Called into room by patients family and Geraldine Contras, [place] at [phone] for patient 'not acting right'.  Pt was noted to be unresponsive, body tense.  Seizure activity noted.  pts pupils noted to be pinpoint-unreactive.  Pt noted to have sats that were 57% on a [place].  Attempted to lay patient down to begin bagging.  Respiratory called, pt placed on NRB while MD, respiratory and another RN called into help.  Pt continues to be unresponsive, bagged pt up to 100%.  Intubation done by Dr. Alto Denver.

## 2012-06-03 NOTE — Progress Notes (Signed)
  Echocardiogram 2D Echocardiogram has been performed.  Georgian Co 06/03/2012, 4:10 PM

## 2012-06-03 NOTE — ED Notes (Signed)
Per Dr. Alto Denver, RSI ordered. At 0950 1 mg Ativan, 20 mg Etomidate, and 100 mg Succinylcholine given for RSI. Dr. Alto Denver to intubate at [phone], pt. O2 sats remained 99% for entire intubation. Capnography turned to yellow, lung sounds and abdomen sounds listened for placement by Dr. Katrinka Blazing.  ET tube 7 1/2 and 23 at lip.

## 2012-06-03 NOTE — ED Notes (Signed)
Blood cultures collected prior to antibiotic administration. 

## 2012-06-03 NOTE — ED Notes (Signed)
Pt reports coming to the ED this morning for eval of N/V x 2 days.  Unsure of temperature at home or not.  Pt denies pain.  Reports productive cough x 2 days.  Yellow sputum.  Pt noted to urinate on herself in triage.  Pt ambulatory with assistance from wheelchair to bed.

## 2012-06-03 NOTE — Consult Note (Signed)
Reason for Consult:  Referring Physician:  Cardiologist: Dr. Alicia Amel is an 78 y.o. female.  HPI:   The patient is a 78 year old African American female with a history of hypertension, diabetes, hyperlipidemia, gastroesophageal reflux disease, peripheral vascular disease, anxiety, arthritis, obesity.  She was last seen by Dr. Gery Pray on January 2012. Last Myoview stress test was July 2009 and was negative. Chest 2-D echocardiogram at that time showed normal LV function and mild aortic insufficiency and aortic sclerosis without stenosis..  According to family members the patient called over today complaining of wheezing. She had increased temperature. On route to the emergency room she vomited times one in the car.  Patient had been conversing normally while in the emergency room and suddenly became unresponsive.  She had to be intubated.  Chest x-ray shows pneumonia. Initial troponin POC was 0.5 and subsequent check it was 0.29.  EKG shows worsening inferior lateral ST depression, with PVCs and right axis deviation.  BNP is elevated at 1421. A CT of her head shows no acute intracranial findings.  Past Medical History  Diagnosis Date  . Diabetes mellitus   . Hypertension   . Hyperlipemia   . Phlebitis 11/30/11    RLE  . Skin ulcer(s) 11/30/11    BLE; draining  . GERD (gastroesophageal reflux disease)   . Arthritis   . Anxiety   . Peripheral vascular disease   . Neuromuscular disorder     numbness feet    Past Surgical History  Procedure Date  . Abdominal hysterectomy   . Cataract extraction w/ intraocular lens  implant, bilateral 1990's  . Blood clot 1990's    RLE; "cut out"  . Eye surgery   . Incision and drainage of wound 01/21/2012    Procedure: IRRIGATION AND DEBRIDEMENT WOUND;  Surgeon: Wayland Denis, DO;  Location: [facility];  Service: Plastics;  Laterality: Bilateral;  I&D bilateral lower extremities and placement of wound vac left lower leg  .  Skin split graft 02/18/2012    Procedure: SKIN GRAFT SPLIT THICKNESS;  Surgeon: Wayland Denis, DO;  Location: [facility];  Service: Plastics;  Laterality: Bilateral;  irrigation bilateral lower extremities, ACell placement bilateral lower legs, Wound Vac placement left lower leg  . Skin split graft 03/17/2012    Procedure: SKIN GRAFT SPLIT THICKNESS;  Surgeon: Wayland Denis, DO;  Location: [facility];  Service: Plastics;  Laterality: Left;  LEFT LEG SKIN GRAFT WITH SPLIT THICKNESS   . Application of wound vac 03/17/2012    Procedure: APPLICATION OF WOUND VAC;  Surgeon: Wayland Denis, DO;  Location: [facility];  Service: Plastics;  Laterality: Left;  open ulcer    History reviewed. No pertinent family history.  Social History:  reports that she has never smoked. She has never used smokeless tobacco. She reports that she does not drink alcohol or use illicit drugs.  Allergies: No Known Allergies  Medications:     . acetaminophen  975 mg Oral Once  . azithromycin  500 mg Intravenous Once  . cefTRIAXone (ROCEPHIN)  IV  1 g Intravenous Once  . etomidate  20 mg Intravenous Once  . fentaNYL  100 mcg Intravenous Once  . fentaNYL  50 mcg Intravenous Once  . lidocaine (cardiac) 100 mg/5ml      . lidocaine      . LORazepam  1 mg Intravenous Once  . midazolam  1 mg Intravenous Once  . midazolam  2 mg Intravenous Once  .  rocuronium      . sodium chloride  1,000 mL Intravenous Once  . sodium chloride  1,000 mL Intravenous Once  . succinylcholine  100 mg Intravenous Once     Results for orders placed during the hospital encounter of 06/03/12 (from the past 48 hour(s))  CBC     Status: Abnormal   Collection Time   06/03/12  7:31 AM      Component Value Range Comment   WBC 11.4 (*) 4.0 - 10.5 K/uL    RBC 4.30  3.87 - 5.11 MIL/uL    Hemoglobin 10.9 (*) 12.0 - 15.0 g/dL    HCT 19.1 (*) 47.8 - 46.0 %    MCV 78.6  78.0 - 100.0 fL    MCH 25.3 (*) 26.0 - 34.0 pg    MCHC 32.2  30.0 - 36.0 g/dL     RDW 29.5  62.1 - 30.8 %    Platelets 268  150 - 400 K/uL   DIFFERENTIAL     Status: Abnormal   Collection Time   06/03/12  7:31 AM      Component Value Range Comment   Neutrophils Relative 87 (*) 43 - 77 %    Neutro Abs 9.9 (*) 1.7 - 7.7 K/uL    Lymphocytes Relative 8 (*) 12 - 46 %    Lymphs Abs 0.9  0.7 - 4.0 K/uL    Monocytes Relative 5  3 - 12 %    Monocytes Absolute 0.5  0.1 - 1.0 K/uL    Eosinophils Relative 0  0 - 5 %    Eosinophils Absolute 0.0  0.0 - 0.7 K/uL    Basophils Relative 0  0 - 1 %    Basophils Absolute 0.0  0.0 - 0.1 K/uL   COMPREHENSIVE METABOLIC PANEL     Status: Abnormal   Collection Time   06/03/12  7:31 AM      Component Value Range Comment   Sodium 138  135 - 145 mEq/L    Potassium 4.0  3.5 - 5.1 mEq/L    Chloride 102  96 - 112 mEq/L    CO2 25  19 - 32 mEq/L    Glucose, Bld 139 (*) 70 - 99 mg/dL    BUN 16  6 - 23 mg/dL    Creatinine, Ser 6.57  0.50 - 1.10 mg/dL    Calcium 9.4  8.4 - 84.6 mg/dL    Total Protein 6.9  6.0 - 8.3 g/dL    Albumin 3.5  3.5 - 5.2 g/dL    AST 17  0 - 37 U/L    ALT 6  0 - 35 U/L    Alkaline Phosphatase 62  39 - 117 U/L    Total Bilirubin 0.4  0.3 - 1.2 mg/dL    GFR calc non Af Amer 59 (*) >90 mL/min    GFR calc Af Amer 68 (*) >90 mL/min   LIPASE, BLOOD     Status: Normal   Collection Time   06/03/12  7:31 AM      Component Value Range Comment   Lipase 20  11 - 59 U/L   LACTIC ACID, PLASMA     Status: Abnormal   Collection Time   06/03/12  7:32 AM      Component Value Range Comment   Lactic Acid, Venous 3.4 (*) 0.5 - 2.2 mmol/L   GLUCOSE, CAPILLARY     Status: Abnormal   Collection Time   06/03/12  7:33 AM  Component Value Range Comment   Glucose-Capillary 130 (*) 70 - 99 mg/dL    Comment 1 Documented in Chart      Comment 2 Notify RN     PRO B NATRIURETIC PEPTIDE     Status: Abnormal   Collection Time   06/03/12  7:37 AM      Component Value Range Comment   Pro B Natriuretic peptide (BNP) 1421.0 (*) 0 - 450 pg/mL     POCT I-STAT TROPONIN I     Status: Normal   Collection Time   06/03/12  8:06 AM      Component Value Range Comment   Troponin i, poc 0.05  0.00 - 0.08 ng/mL    Comment 3            URINALYSIS, ROUTINE W REFLEX MICROSCOPIC     Status: Abnormal   Collection Time   06/03/12  9:03 AM      Component Value Range Comment   Color, Urine YELLOW  YELLOW    APPearance CLEAR  CLEAR    Specific Gravity, Urine 1.011  1.005 - 1.030    pH 6.0  5.0 - 8.0    Glucose, UA NEGATIVE  NEGATIVE mg/dL    Hgb urine dipstick SMALL (*) NEGATIVE    Bilirubin Urine NEGATIVE  NEGATIVE    Ketones, ur NEGATIVE  NEGATIVE mg/dL    Protein, ur NEGATIVE  NEGATIVE mg/dL    Urobilinogen, UA 1.0  0.0 - 1.0 mg/dL    Nitrite NEGATIVE  NEGATIVE    Leukocytes, UA SMALL (*) NEGATIVE   URINE MICROSCOPIC-ADD ON     Status: Normal   Collection Time   06/03/12  9:03 AM      Component Value Range Comment   Squamous Epithelial / LPF RARE  RARE    WBC, UA 3-6  <3 WBC/hpf    RBC / HPF 3-6  <3 RBC/hpf    Bacteria, UA RARE  RARE   POCT I-STAT 3, BLOOD GAS (G3+)     Status: Abnormal   Collection Time   06/03/12  9:15 AM      Component Value Range Comment   pH, Arterial 7.392  7.350 - 7.400    pCO2 arterial 38.2  35.0 - 45.0 mmHg    pO2, Arterial 53.0 (*) 80.0 - 100.0 mmHg    Bicarbonate 23.3  20.0 - 24.0 mEq/L    TCO2 24  0 - 100 mmol/L    O2 Saturation 87.0      Acid-base deficit 1.0  0.0 - 2.0 mmol/L    Collection site RADIAL, ALLEN'S TEST ACCEPTABLE      Drawn by Operator      Sample type ARTERIAL     GLUCOSE, CAPILLARY     Status: Abnormal   Collection Time   06/03/12  9:41 AM      Component Value Range Comment   Glucose-Capillary 233 (*) 70 - 99 mg/dL   POCT I-STAT TROPONIN I     Status: Abnormal   Collection Time   06/03/12 10:20 AM      Component Value Range Comment   Troponin i, poc 0.29 (*) 0.00 - 0.08 ng/mL    Comment NOTIFIED PHYSICIAN      Comment 3            POCT I-STAT 3, BLOOD GAS (G3+)     Status: Abnormal    Collection Time   06/03/12 10:47 AM      Component Value Range Comment  pH, Arterial 7.214 (*) 7.350 - 7.400    pCO2 arterial 54.0 (*) 35.0 - 45.0 mmHg    pO2, Arterial 210.0 (*) 80.0 - 100.0 mmHg    Bicarbonate 21.8  20.0 - 24.0 mEq/L    TCO2 23  0 - 100 mmol/L    O2 Saturation 100.0      Acid-base deficit 6.0 (*) 0.0 - 2.0 mmol/L    Collection site RADIAL, ALLEN'S TEST ACCEPTABLE      Drawn by Operator      Sample type ARTERIAL       Ct Head Wo Contrast  06/03/2012  *RADIOLOGY REPORT*  Clinical Data: Productive cough.  Pneumonia.  Unresponsive  CT HEAD WITHOUT CONTRAST  Technique:  Contiguous axial images were obtained from the base of the skull through the vertex without contrast.  Comparison: None.  Findings: No acute intracranial hemorrhage.  No focal mass lesion. No CT evidence of acute infarction.   No midline shift or mass effect.  No hydrocephalus.  Basilar cisterns are patent.  There is generalized cortical atrophy.  There is mild periventricular and subcortical white matter hypodensities. Paranasal sinuses and mastoid air cells are clear.  Orbits are normal.  IMPRESSION:  1.  No acute intracranial findings.  2.  Atrophy and microvascular disease.  Original Report Authenticated By: Genevive Bi, M.D.   Dg Chest Portable 1 View  06/03/2012  *RADIOLOGY REPORT*  Clinical Data: Intubation.  Pneumonia.  PORTABLE CHEST - 1 VIEW 06/03/2012 1004 hours:  Comparison: Portable chest x-ray earlier same date 0723 hours.  Findings: Endotracheal tube tip in satisfactory position approximately 5 cm above the carina.  Interval worsening of consolidation at the right lung base.  No significant change in the consolidation in the left lung.  IMPRESSION:  1.  Endotracheal tube tip in satisfactory position approximately 5 cm above the carina. 2.  Worsening pneumonia at the right lung base.  Stable left lung pneumonia since earlier in the day.  Original Report Authenticated By: Arnell Sieving, M.D.   Dg  Chest Portable 1 View  06/03/2012  *RADIOLOGY REPORT*  Clinical Data: Cough.  Shortness of breath.  Nausea and vomiting.  PORTABLE CHEST - 1 VIEW 06/03/2012 0723 hours:  Comparison: Two-view chest x-ray 02/13/2012.  Findings: Interval development of airspace consolidation throughout much of the left lung, greatest in the lower lobe.  Minimal patchy airspace opacities at the right lung base.  Right upper lung remains clear.  Cardiac silhouette upper normal in size to slightly enlarged but stable.  Pulmonary vascularity normal.  IMPRESSION: Pneumonia throughout much of the left lung, worst in the left lower lobe.  Minimal patchy pneumonia at the right lung base.  Original Report Authenticated By: Arnell Sieving, M.D.    Review of Systems  Unable to perform ROS: intubated  Constitutional: Positive for fever.  Respiratory: Positive for wheezing.   Gastrointestinal: Positive for vomiting.   Blood pressure 148/77, pulse 71, temperature 100.5 F (38.1 C), temperature source Oral, resp. rate 18, height 5\' 4"  (1.626 m), weight 74.8 kg (164 lb 14.5 oz), SpO2 100.00%. Physical Exam  Constitutional: She appears distressed.       Intubated  Cardiovascular: Normal rate and regular rhythm.   No murmur heard. Pulses:      Radial pulses are 2+ on the right side, and 1+ on the left side.       Dorsalis pedis pulses are 0 on the right side, and 0 on the left side.  Posterior tibial pulses are 0 on the right side, and 0 on the left side.       No palpable pedal pulses. Lower extremities warm.  Respiratory:       Vent.  Coarse breath sounds  GI: Bowel sounds are normal.  Musculoskeletal: She exhibits edema (1+ ankle edema).  Neurological: She exhibits abnormal muscle tone.       Intubated  Skin: Skin is warm and dry.    Assessment/Plan: Patient Active Problem List  Diagnosis  . Onychomycosis  . DM type 2 with diabetic peripheral neuropathy  . HTN (hypertension)  . Ulcer of other part of lower  limb  . Anxiety  . GERD (gastroesophageal reflux disease)  . CAP (community acquired pneumonia)  . Respiratory failure, acute  . Altered mental status  . Sepsis   Plan:  The patient is currently intubated with increasing Troponin and inferolateral ST depression, RAD on EKG. Negative acute changes on CT head. Will start IV heparin for ACS.  Will Get Echo.   HAGER, BRYAN 06/03/2012, 12:13 PM     Patient seen and examined. Agree with assessment and plan.78 yo AAF with a history of htn, DM, obesity, GERD who presents with wheezing in the setting of RLL pneumonia and subsequent respiratory failure with initial generalized seizure activity. Pt is now intubated and received keppra. ECG show inferolateral T wave inversion. Initial POC markers are negative but subsequent trop is mildly positive in the setting of possible sepsis. Initial temp elevated at 103. Will check 2 d echo. May initiate anticoagulation; however, need to defer initiation if lumbar puncture is to be performed.  Lennette Bihari, MD, [facility] 06/03/2012 13:07      Lennette Bihari, MD, [facility] 06/03/2012 12:54 PM

## 2012-06-03 NOTE — ED Notes (Signed)
PT. WOKE UP THIS MORNING WITH CHILLS AND VOMITTING WITH PRODUCTIVE COUGH .

## 2012-06-03 NOTE — Progress Notes (Signed)
Medication delay note: Keppra 1gm was due at 1300- this was made and delivered to the ED, not given there, so MICU RN had to track down dose, was remade by pharmacy and eventually given at 1600.  Zniyah Midkiff K. Allena Katz, PharmD, BCPS.  Clinical Pharmacist Pager [phone]. 06/03/2012 4:12 PM

## 2012-06-03 NOTE — Progress Notes (Addendum)
ANTICOAGULATION CONSULT NOTE - Initial Consult  Pharmacy Consult for Heparin Indication: chest pain/ACS  No Known Allergies  Patient Measurements: Height: 5\' 4"  (162.6 cm) Weight: 164 lb 14.5 oz (74.8 kg) (from 03/11/12) IBW/kg (Calculated) : 54.7  Heparin Dosing Weight: 70Kg  Vital Signs: Temp: 99 F (37.2 C) (07/02 1400) Temp src: Oral (07/02 0907) BP: 148/77 mmHg (07/02 1202) Pulse Rate: 71  (07/02 1202)  Labs:  Basename 06/03/12 1540 06/03/12 1420 06/03/12 1413 06/03/12 1412 06/03/12 0731  HGB -- 10.9* -- -- 10.9*  HCT -- 34.1* -- -- 33.8*  PLT -- 254 -- -- 268  APTT 35 -- 124* -- --  LABPROT -- -- 15.7* -- --  INR -- -- 1.22 -- --  HEPARINUNFRC -- -- -- -- --  CREATININE -- -- 0.78 -- 0.91  CKTOTAL -- -- -- 110 --  CKMB -- -- -- 6.1* --  TROPONINI -- -- -- 1.22* --    Estimated Creatinine Clearance: 57.4 ml/min (by C-G formula based on Cr of 0.78).   Medical History: Past Medical History  Diagnosis Date  . Diabetes mellitus   . Hypertension   . Hyperlipemia   . Phlebitis 11/30/11    RLE  . Skin ulcer(s) 11/30/11    BLE; draining  . GERD (gastroesophageal reflux disease)   . Arthritis   . Anxiety   . Peripheral vascular disease   . Neuromuscular disorder     numbness feet    Medications:  Prescriptions prior to admission  Medication Sig Dispense Refill  . acidophilus (RISAQUAD) CAPS Take 1 capsule by mouth daily.      Marland Kitchen ALPRAZolam (XANAX) 0.25 MG tablet Take 0.25 mg by mouth daily as needed. For anxiety      . aspirin 81 MG chewable tablet Chew 81 mg by mouth daily.        . calcium-vitamin D (OSCAL WITH D) 500-200 MG-UNIT per tablet Take 1 tablet by mouth daily.      . cloNIDine (CATAPRES) 0.3 MG tablet Take 0.3 mg by mouth 3 (three) times daily.       Marland Kitchen gabapentin (NEURONTIN) 300 MG capsule Take 300 mg by mouth 2 (two) times daily.       Marland Kitchen lisinopril (PRINIVIL,ZESTRIL) 20 MG tablet Take 20 mg by mouth 2 (two) times daily.        . metFORMIN  (GLUCOPHAGE) 1000 MG tablet Take 1,000 mg by mouth 2 (two) times daily with a meal.        . metoprolol (TOPROL-XL) 100 MG 24 hr tablet Take 100 mg by mouth 2 (two) times daily.       . Multiple Vitamins-Minerals (CENTRUM SILVER PO) Take 1 tablet by mouth daily.      . nisoldipine (SULAR) 34 MG 24 hr tablet Take 34 mg by mouth daily.       Marland Kitchen omeprazole (PRILOSEC) 40 MG capsule Take 40 mg by mouth 2 (two) times daily.       . traMADol (ULTRAM) 50 MG tablet Take 50 mg by mouth every 6 (six) hours as needed. Maximum dose= 8 tablets per day. For pain      . triamcinolone (KENALOG) 0.025 % ointment Apply 1 application topically 2 (two) times daily. To legs      . vitamin C (ASCORBIC ACID) 500 MG tablet Take 500 mg by mouth daily.      . Zinc 50 MG TABS Take 1 tablet by mouth daily.        Assessment:  Patient admitted with resp failure, AMS, pna and found to have ST depression and elevated troponins.  Received orders for IV heparin to start for NSTEMI. Heparin start is delayed until LP is done. Patient has received ASA PO.  Noted concern for meningitis, plans to perform LP. Patient also seized in the ED. D/w Dr. Stewart/neurologist who recommends starting heparin 4h after LP is done, without a bolus.  Goal of Therapy:  Heparin level 0.3-0.7 units/ml Monitor platelets by anticoagulation protocol: Yes   Plan:  - Will f/up to start IV heparin 4h s/p LP done  Christella Hartigan 06/03/2012,4:14 PM  Addendum:  Heparin to be started at 2100 at a rate of 1000 units/hr with no bolus due to recent LP.  Heparin level and CBC daily.

## 2012-06-03 NOTE — Consult Note (Signed)
[facility] CONSULT NOTE     Reason for Consult: Altered mental status with generalized seizure.   HPI:    [name] is an 78 y.o. female history of hypertension, hyperlipidemia, diabetes mellitus and peripheral vascular disease, who was brought to the emergency room febrile and short of breath. She subsequently developed respiratory failure and required intubation and mechanical ventilation. Patient reportedly had a witnessed generalized seizure in the emergency room. There is no previous history of seizure disorder. She was given Ativan and Versed. She currently is on propofol drip. No further seizure activity has been reported. CT scan of her head showed no acute intracranial abnormality. Patient was admitted for management of community-acquired pneumonia and sepsis as well as management of new-onset seizure activity.  Past Medical History  Diagnosis Date  . Diabetes mellitus   . Hypertension   . Hyperlipemia   . Phlebitis 11/30/11    RLE  . Skin ulcer(s) 11/30/11    BLE; draining  . GERD (gastroesophageal reflux disease)   . Arthritis   . Anxiety   . Peripheral vascular disease   . Neuromuscular disorder     numbness feet    Past Surgical History  Procedure Date  . Abdominal hysterectomy   . Cataract extraction w/ intraocular lens  implant, bilateral 1990's  . Blood clot 1990's    RLE; "cut out"  . Eye surgery   . Incision and drainage of wound 01/21/2012    Procedure: IRRIGATION AND DEBRIDEMENT WOUND;  Surgeon: Wayland Denis, DO;  Location: [facility];  Service: Plastics;  Laterality: Bilateral;  I&D bilateral lower extremities and placement of wound vac left lower leg  . Skin split graft 02/18/2012    Procedure: SKIN GRAFT SPLIT THICKNESS;  Surgeon: Wayland Denis, DO;  Location: [facility];  Service: Plastics;  Laterality: Bilateral;  irrigation bilateral lower extremities, ACell placement bilateral lower  legs, Wound Vac placement left lower leg  . Skin split graft 03/17/2012    Procedure: SKIN GRAFT SPLIT THICKNESS;  Surgeon: Wayland Denis, DO;  Location: [facility];  Service: Plastics;  Laterality: Left;  LEFT LEG SKIN GRAFT WITH SPLIT THICKNESS   . Application of wound vac 03/17/2012    Procedure: APPLICATION OF WOUND VAC;  Surgeon: Wayland Denis, DO;  Location: [facility];  Service: Plastics;  Laterality: Left;  open ulcer    History reviewed. No pertinent family history.  Social History:  reports that she has never smoked. She has never used smokeless tobacco. She reports that she does not drink alcohol or use illicit drugs.  No Known Allergies  Medications:    Scheduled:   . acetaminophen  975 mg Oral Once  . azithromycin  500 mg Intravenous Once  . cefTRIAXone (ROCEPHIN)  IV  1 g Intravenous Once  . etomidate  20 mg Intravenous Once  . fentaNYL  100 mcg Intravenous Once  . fentaNYL  50 mcg Intravenous Once  . levetiracetam  1,000 mg Intravenous Once  . levetiracetam  500 mg Intravenous Q12H  . lidocaine (cardiac) 100 mg/56ml      . lidocaine      . LORazepam  1 mg Intravenous Once  . midazolam  1 mg Intravenous Once  . midazolam  2 mg Intravenous Once  . rocuronium      . sodium chloride  1,000 mL Intravenous Once  .  sodium chloride  1,000 mL Intravenous Once  . succinylcholine  100 mg Intravenous Once   Continuous:   . propofol 5 mcg/kg/min (06/03/12 1145)    Blood pressure 148/77, pulse 71, temperature 100.5 F (38.1 C), temperature source Oral, resp. rate 18, height 5\' 4"  (1.626 m), weight 74.8 kg (164 lb 14.5 oz), SpO2 100.00%.   Neurologic Examination:  Patient was intubated and sedated and on mechanical ventilation. She was unresponsive to all stimuli including noxious stimuli. No spontaneous respirations were noted. Pupils were 2 mm in size and too small to appreciate any degree of reaction to light. Extraocular movements were absent with oculocephalic maneuvers. Face  was symmetrical with no obvious signs of facial weakness. Muscle tone was flaccid throughout. Patient had no spontaneous movements nor withdrawal movements to noxious stimuli. There was no abnormal posturing. Deep tendon reflexes were absent throughout. Plantar responses were mute bilaterally. No clinical signs of meningeal irritation.   Ct Head Wo Contrast  06/03/2012  *RADIOLOGY REPORT*  Clinical Data: Productive cough.  Pneumonia.  Unresponsive  CT HEAD WITHOUT CONTRAST  Technique:  Contiguous axial images were obtained from the base of the skull through the vertex without contrast.  Comparison: None.  Findings: No acute intracranial hemorrhage.  No focal mass lesion. No CT evidence of acute infarction.   No midline shift or mass effect.  No hydrocephalus.  Basilar cisterns are patent.  There is generalized cortical atrophy.  There is mild periventricular and subcortical white matter hypodensities. Paranasal sinuses and mastoid air cells are clear.  Orbits are normal.  IMPRESSION:  1.  No acute intracranial findings.  2.  Atrophy and microvascular disease.  Original Report Authenticated By: Genevive Bi, M.D.   Dg Chest Portable 1 View  06/03/2012  *RADIOLOGY REPORT*  Clinical Data: Intubation.  Pneumonia.  PORTABLE CHEST - 1 VIEW 06/03/2012 1004 hours:  Comparison: Portable chest x-ray earlier same date 0723 hours.  Findings: Endotracheal tube tip in satisfactory position approximately 5 cm above the carina.  Interval worsening of consolidation at the right lung base.  No significant change in the consolidation in the left lung.  IMPRESSION:  1.  Endotracheal tube tip in satisfactory position approximately 5 cm above the carina. 2.  Worsening pneumonia at the right lung base.  Stable left lung pneumonia since earlier in the day.  Original Report Authenticated By: Arnell Sieving, M.D.   Dg Chest Portable 1 View  06/03/2012  *RADIOLOGY REPORT*  Clinical Data: Cough.  Shortness of breath.  Nausea  and vomiting.  PORTABLE CHEST - 1 VIEW 06/03/2012 0723 hours:  Comparison: Two-view chest x-ray 02/13/2012.  Findings: Interval development of airspace consolidation throughout much of the left lung, greatest in the lower lobe.  Minimal patchy airspace opacities at the right lung base.  Right upper lung remains clear.  Cardiac silhouette upper normal in size to slightly enlarged but stable.  Pulmonary vascularity normal.  IMPRESSION: Pneumonia throughout much of the left lung, worst in the left lower lobe.  Minimal patchy pneumonia at the right lung base.  Original Report Authenticated By: Arnell Sieving, M.D.     Assessment/Plan:  Acute generalized seizure activity of new onset in the setting of acute respiratory failure with pneumonia and sepsis. Etiology for seizures unclear. Altered mental status on presentation may well been due to to febrile state and sepsis. Patient clinically had no signs of meningeal irritation. However, given her underlying infectious process, acute meningitis cannot be ruled out.  Recommendations: 1. Keppra 1000  mg loading dose followed by 500 mg every 12 hours for anticonvulsant maintenance. 2. EEG, routine. 3. Lumbar puncture to rule out possible acute meningitis as etiology for patient's generalized seizure, and for antibiotic coverage if meningitis is indicated by CSF results.  Venetia Maxon M.D. [facility] [phone]  06/03/2012, 12:30 PM

## 2012-06-03 NOTE — ED Notes (Signed)
Chest x-ray by the bedside

## 2012-06-03 NOTE — Progress Notes (Signed)
Initial review for inpatient status is complete. 

## 2012-06-03 NOTE — Progress Notes (Deleted)
Acetaminophen given

## 2012-06-03 NOTE — Progress Notes (Signed)
Shift Review:  Received pt report from 2100 RN Chris McKowen at 1300. Pt had just arrived on the unit. Pt Cardiac Rhythm and VSS at this time. Continual Q1 hour ECG, Resp, SpO2 and BP monitoring in place. Pt on NS and propofol gtt (see MAR). See RN 1300 assessment. Missing Keppra dose. RN called pharmacy and requested Keppra. Pharm D. Dr. Allena Katz made aware of missing dose who consulted with pharmacy.   1352 Consent for LP done by RN and Dr. Tyson Alias. Explained procedure and risks to patients daughter.  1608 Keppra arrived on the unit and was administered by RN.  1700 Time out for LP procedure done. Physicians prepared for LP in sterile fashion. VSS before procedure. Pt sedated on propofol gtt at 5 mcg/kg/min. Pt given 100 mcg of fentanyl before start of procedure. Patient comfortable in side lying position during procedure. VSS stable throughout. Specimens sent to lab.   1900 Report given to oncoming RN. Restated order to keep patient flat for 6 hours post LP. Patient flat in reverse trendelenberg position. Restated to RN to wait to start heparin gtt 4 hours post LP.

## 2012-06-04 ENCOUNTER — Other Ambulatory Visit (HOSPITAL_COMMUNITY): Payer: Medicare Other

## 2012-06-04 ENCOUNTER — Inpatient Hospital Stay (HOSPITAL_COMMUNITY): Payer: Medicare Other

## 2012-06-04 DIAGNOSIS — I83009 Varicose veins of unspecified lower extremity with ulcer of unspecified site: Secondary | ICD-10-CM | POA: Diagnosis present

## 2012-06-04 DIAGNOSIS — I214 Non-ST elevation (NSTEMI) myocardial infarction: Secondary | ICD-10-CM | POA: Diagnosis present

## 2012-06-04 DIAGNOSIS — F411 Generalized anxiety disorder: Secondary | ICD-10-CM

## 2012-06-04 LAB — GLUCOSE, CAPILLARY
Glucose-Capillary: 113 mg/dL — ABNORMAL HIGH (ref 70–99)
Glucose-Capillary: 114 mg/dL — ABNORMAL HIGH (ref 70–99)
Glucose-Capillary: 125 mg/dL — ABNORMAL HIGH (ref 70–99)
Glucose-Capillary: 153 mg/dL — ABNORMAL HIGH (ref 70–99)

## 2012-06-04 LAB — POCT I-STAT 3, ART BLOOD GAS (G3+)
Bicarbonate: 22.2 mEq/L (ref 20.0–24.0)
pCO2 arterial: 30.4 mmHg — ABNORMAL LOW (ref 35.0–45.0)
pH, Arterial: 7.476 — ABNORMAL HIGH (ref 7.350–7.400)

## 2012-06-04 LAB — CARDIAC PANEL(CRET KIN+CKTOT+MB+TROPI)
CK, MB: 4.2 ng/mL — ABNORMAL HIGH (ref 0.3–4.0)
Total CK: 96 U/L (ref 7–177)
Troponin I: 0.81 ng/mL (ref <0.30)

## 2012-06-04 LAB — LEGIONELLA ANTIGEN, URINE: Legionella Antigen, Urine: NEGATIVE

## 2012-06-04 LAB — BASIC METABOLIC PANEL
BUN: 12 mg/dL (ref 6–23)
Chloride: 104 mEq/L (ref 96–112)
GFR calc Af Amer: >90 mL/min (ref >90)
Potassium: 3.5 mEq/L (ref 3.5–5.1)

## 2012-06-04 LAB — HEPARIN LEVEL (UNFRACTIONATED): Heparin Unfractionated: 0.37 IU/mL (ref 0.30–0.70)

## 2012-06-04 LAB — CBC
HCT: 31.4 % — ABNORMAL LOW (ref 36.0–46.0)
MCH: 24.8 pg — ABNORMAL LOW (ref 26.0–34.0)
MCV: 76.4 fL — ABNORMAL LOW (ref 78.0–100.0)
RDW: 13.8 % (ref 11.5–15.5)
WBC: 19.4 10*3/uL — ABNORMAL HIGH (ref 4.0–10.5)

## 2012-06-04 LAB — URINE CULTURE: Colony Count: 70000

## 2012-06-04 LAB — HERPES SIMPLEX VIRUS(HSV) DNA BY PCR: HSV 2 DNA: NOT DETECTED

## 2012-06-04 LAB — MAGNESIUM: Magnesium: 1.2 mg/dL — ABNORMAL LOW (ref 1.5–2.5)

## 2012-06-04 MED ORDER — METOPROLOL TARTRATE 1 MG/ML IV SOLN
5.0000 mg | Freq: Two times a day (BID) | INTRAVENOUS | Status: DC
  Administered 2012-06-04 (×2): 5 mg via INTRAVENOUS
  Filled 2012-06-04 (×4): qty 5

## 2012-06-04 MED ORDER — PROPOFOL 10 MG/ML IV EMUL
5.0000 ug/kg/min | INTRAVENOUS | Status: DC
  Filled 2012-06-04: qty 100

## 2012-06-04 MED ORDER — MAGNESIUM SULFATE 40 MG/ML IJ SOLN
4.0000 g | Freq: Once | INTRAMUSCULAR | Status: AC
  Administered 2012-06-04: 4 g via INTRAVENOUS
  Filled 2012-06-04 (×2): qty 100

## 2012-06-04 MED ORDER — MAGNESIUM SULFATE 40 MG/ML IJ SOLN
2.0000 g | Freq: Once | INTRAMUSCULAR | Status: AC
  Administered 2012-06-04: 2 g via INTRAVENOUS
  Filled 2012-06-04: qty 50

## 2012-06-04 MED ORDER — PROPOFOL 10 MG/ML IV EMUL
5.0000 ug/kg/min | INTRAVENOUS | Status: DC
  Administered 2012-06-04: 20 ug/kg/min via INTRAVENOUS
  Filled 2012-06-04: qty 100

## 2012-06-04 MED ORDER — ASPIRIN 300 MG RE SUPP
300.0000 mg | Freq: Every day | RECTAL | Status: DC
  Administered 2012-06-04: 300 mg via RECTAL
  Filled 2012-06-04 (×2): qty 1

## 2012-06-04 MED ORDER — POTASSIUM CHLORIDE 10 MEQ/50ML IV SOLN
10.0000 meq | INTRAVENOUS | Status: AC
  Administered 2012-06-04 (×2): 10 meq via INTRAVENOUS
  Filled 2012-06-04: qty 100

## 2012-06-04 MED ORDER — NITROGLYCERIN 2 % TD OINT
0.5000 [in_us] | TOPICAL_OINTMENT | Freq: Three times a day (TID) | TRANSDERMAL | Status: DC
  Administered 2012-06-04 – 2012-06-06 (×6): 0.5 [in_us] via TOPICAL
  Filled 2012-06-04: qty 30

## 2012-06-04 MED ORDER — METOPROLOL TARTRATE 25 MG/10 ML ORAL SUSPENSION
25.0000 mg | Freq: Two times a day (BID) | ORAL | Status: DC
  Filled 2012-06-04 (×2): qty 10

## 2012-06-04 NOTE — Progress Notes (Signed)
UR Completed.  [name] [phone] 06/04/2012

## 2012-06-04 NOTE — Progress Notes (Signed)
Name: [name] MRN: [id number] DOB: September 15, 1931    LOS: 1  Referring Provider:  EDP Reason for Referral:   Respiratory failure  PULMONARY / CRITICAL CARE MEDICINE  Brief patient description:  78 year old African female with sepsis pneumonia and respiratory failure and altered mental status  Events Since Admission: 7/2- Intubated in ED on admission  7/2- LP, CT head 7/3- improved neurostatus  Current Status: awake  Tubes/Lines: ETT 7/2 >>> R hand PIV 7/2 >>> L Hand PIV 7/2 >>> R Subclavian 7/2 >>> NG 7/2 >>>  Vital Signs: Temp:  [98.1 F (36.7 C)-101.8 F (38.8 C)] 100.2 F (37.9 C) (07/03 0700) Pulse Rate:  [68-104] 104  (07/03 0842) Resp:  [17-21] 19  (07/03 0842) BP: (99-150)/(51-86) 104/68 mmHg (07/03 0700) SpO2:  [99 %-100 %] 100 % (07/03 0842) FiO2 (%):  [40 %-100 %] 40 % (07/03 0842) Weight:  [164 lb 14.5 oz (74.8 kg)-176 lb 2.4 oz (79.9 kg)] 176 lb 2.4 oz (79.9 kg) (07/03 0500)  Physical Examination: General: on vent responsive Neuro:  Intact, follows commands well, nonfocal HEENT:  Endotracheal tube in place, mucous membranes dry Neck:  Neck supple with jugular venous suspension not noted Cardiovascular:  Regular rate and rhythm normal S1-S2 no S3 or S4 Lungs:  Bilateral rales and poor breath sounds Abdomen:  Soft nontender bowel sounds hypoactive, no hepatosplenomegaly Musculoskeletal:  Full range of motion Skin:  Clear  Principal Problem:  *CAP (community acquired pneumonia) Active Problems:  DM type 2 with diabetic peripheral neuropathy  Respiratory failure, acute  Altered mental status  Sepsis   ASSESSMENT AND PLAN  PULMONARY  Lab 06/04/12 0329 06/03/12 1047 06/03/12 0915  PHART 7.476* 7.214* 7.392  PCO2ART 30.4* 54.0* 38.2  PO2ART 122.0* 210.0* 53.0*  HCO3 22.2 21.8 23.3  O2SAT 99.0 100.0 87.0   Ventilator Settings: Vent Mode:  [-] PSV;CPAP FiO2 (%):  [40 %-100 %] 40 % Set Rate:  [18 bmp] 18 bmp Vt Set:  [430 mL-500 mL] 500  mL PEEP:  [5 cmH20] 5 cmH20 Pressure Support:  [5 cmH20] 5 cmH20 Plateau Pressure:  [11 cmH20-24 cmH20] 11 cmH20 CXR:  Left lower lobe infiltrate and pulmonary edema ETT:  7/2>>  A:  Left lower lobe can be community acquired pneumonia with acute respiratory failure P:   Bronchodilator therapy Adjust vent settings to wean cpap 5 ps 5 , asses rsbi, neurostatus supports extubation pcxr in am assess LLL Followup blood gases reviewed  CARDIOVASCULAR  Lab 06/04/12 0200 06/03/12 2000 06/03/12 1412 06/03/12 0737 06/03/12 0732  TROPONINI 0.81* 1.15* 1.22* -- --  LATICACIDVEN -- -- -- -- 3.4*  PROBNP -- -- -- 1421.0* --   ECG:  T wave inversion in V3-V6 Lines:  As above  A: T wave inversion with troponin elevation. No trending down P:  Heparin drip Full dose aspirin Assess Echo Cardiology consultation on board Beta blocker NTG patch started  RENAL  Lab 06/04/12 0440 06/03/12 1413 06/03/12 0731  NA 139 -- 138  K 3.5 -- 4.0  CL 104 -- 102  CO2 24 -- 25  BUN 12 -- 16  CREATININE 0.74 0.78 0.91  CALCIUM 8.4 -- 9.4  MG 1.2* -- --  PHOS 2.6 -- --   Intake/Output      07/02 0701 - 07/03 0700 07/03 0701 - 07/04 0700   I.V. (mL/kg) 499.9 (6.3)    NG/GT 90    IV Piggyback 455    Total Intake(mL/kg) 1044.9 (13.1)    Urine (mL/kg/hr)  2950 (1.5)    Total Output 2950    Net -1905.2          Foley:  7/2  A: autodiuresing now P:   - BMET in am -allow neg balance -supp k with output increased mag reaplcemeent  GASTROINTESTINAL  Lab 06/03/12 0731  AST 17  ALT 6  ALKPHOS 62  BILITOT 0.4  PROT 6.9  ALBUMIN 3.5    A:   No active gastrointestinal issues  P:   - NPO - ADAT after extubation  HEMATOLOGIC  Lab 06/04/12 0440 06/03/12 1540 06/03/12 1420 06/03/12 1413 06/03/12 0731  HGB 10.2* -- 10.9* -- 10.9*  HCT 31.4* -- 34.1* -- 33.8*  PLT 234 -- 254 -- 268  INR -- -- -- 1.22 --  APTT -- 35 -- 124* --   A:  Mild anemia  P:  - CBC in am Hep IV to sub q when  IV dc'ed  INFECTIOUS  Lab 06/04/12 0440 06/03/12 1420 06/03/12 0731  WBC 19.4* 21.3* 11.4*  PROCALCITON -- -- --   Cultures: Blood cultures x2 7/2>>> Resp c/s 7/2>>> UC 7/2>>> CSF 7/2>>>  Antibiotics: Rocephin 7/2 (CAP)>> Azithromycin 7/2 (CAP)>>  A:  Community acquired pneumonia coverage indicated. WBC trending down P:   - continue Rocephin and azithromycin  - f/u cutlures Ensure strep, leg ag done  ENDOCRINE  Lab 06/04/12 0823 06/04/12 0429 06/04/12 0016 06/03/12 2036 06/03/12 1708  GLUCAP 91 113* 134* 90 105*   A:  Diabetes type 2   P:   ICU hyperglycemia protocol  NEUROLOGIC  A:  Altered mental status unclear etiology, possible hypoxemia and infection. Much improved this am.  P:   - continue to monitor Limit sedation Chair position  BEST PRACTICE / DISPOSITION Level of Care:  ICU level of care  Primary Service:  PCCM service  Consultants:  Cardiology, Code Status:  Full  Diet:  N.p.o.  DVT Px:  Heparin, SCD GI Px:  IV protonix Skin Integrity:  Good  Social / Family:  Family updated completely on 06/03/2012  Ccm 35 min  I have fully examined this patient and agree with above findings.    and edited in full   Shelly Flatten, MD Family Medicine PGY-2 06/04/2012, 9:36 AM

## 2012-06-04 NOTE — Progress Notes (Signed)
Subjective:  Intubated, awake and alert. She denies chest pain to me.  Objective:  Vital Signs in the last 24 hours: Temp:  [98.1 F (36.7 C)-101.8 F (38.8 C)] 100.1 F (37.8 C) (07/03 0900) Pulse Rate:  [68-112] 112  (07/03 0900) Resp:  [4-26] 26  (07/03 0900) BP: (99-151)/(51-86) 151/77 mmHg (07/03 0900) SpO2:  [99 %-100 %] 100 % (07/03 0900) FiO2 (%):  [40 %-60 %] 40 % (07/03 1000) Weight:  [79.9 kg (176 lb 2.4 oz)] 79.9 kg (176 lb 2.4 oz) (07/03 0500)  Intake/Output from previous day:  Intake/Output Summary (Last 24 hours) at 06/04/12 1105 Last data filed at 06/04/12 1000  Gross per 24 hour  Intake 1098.69 ml  Output   2950 ml  Net -1851.31 ml      . antiseptic oral rinse  15 mL Mouth Rinse QID  . aspirin  300 mg Rectal Daily  . azithromycin  500 mg Intravenous Q24H  . cefTRIAXone (ROCEPHIN)  IV  1 g Intravenous Q12H  . chlorhexidine  15 mL Mouth Rinse BID  . fentaNYL      . fentaNYL  100 mcg Intravenous Once  . insulin aspart  0-4 Units Subcutaneous Q4H  . levetiracetam  500 mg Intravenous Q12H  . lidocaine (cardiac) 100 mg/69ml      . lidocaine      . magnesium sulfate 1 - 4 g bolus IVPB  2 g Intravenous Once  . magnesium sulfate 1 - 4 g bolus IVPB  4 g Intravenous Once  . metoprolol  5 mg Intravenous Q12H  . nitroGLYCERIN  0.5 inch Topical Q8H  . pantoprazole (PROTONIX) IV  40 mg Intravenous Q24H  . potassium chloride  10 mEq Intravenous Q1 Hr x 2  . rocuronium      . DISCONTD: metoprolol tartrate  25 mg Per Tube BID   Physical Exam: General appearance: alert, cooperative, no distress and intubated Lungs: decreased breath sounds, no wheezing Heart: regular rate and rhythm   Rate: 115  Rhythm: sinus tachycardia  Lab Results:  Basename 06/04/12 0440 06/03/12 1420  WBC 19.4* 21.3*  HGB 10.2* 10.9*  PLT 234 254    Basename 06/04/12 0440 06/03/12 1413 06/03/12 0731  NA 139 -- 138  K 3.5 -- 4.0  CL 104 -- 102  CO2 24 -- 25  GLUCOSE 118* -- 139*    BUN 12 -- 16  CREATININE 0.74 0.78 --    Basename 06/04/12 0200 06/03/12 2000  TROPONINI 0.81* 1.15*   Hepatic Function Panel  Basename 06/03/12 0731  PROT 6.9  ALBUMIN 3.5  AST 17  ALT 6  ALKPHOS 62  BILITOT 0.4  BILIDIR --  IBILI --   No results found for this basename: CHOL in the last 72 hours  Basename 06/03/12 1413  INR 1.22   Cardiac Panel (last 3 results)  Basename 06/04/12 0200 06/03/12 2000 06/03/12 1412  CKTOTAL 96 111 110  CKMB 4.2* 6.4* 6.1*  TROPONINI 0.81* 1.15* 1.22*  RELINDX RELATIVE INDEX IS INVALID 5.8* 5.5*   BNP (last 3 results)  Basename 06/03/12 0737  PROBNP 1421.0*   Imaging: Imaging results have been reviewed  Cardiac Studies:  Assessment/Plan:   Principal Problem:  *Respiratory failure, acute Active Problems:  Sepsis  NSTEMI, Troponin 1.22 06/03/12  DM type 2 with diabetic peripheral neuropathy  CAP (community acquired pneumonia)  Altered mental status  HTN (hypertension)  GERD (gastroesophageal reflux disease)  Venous ulcer LE, chronic, followed at wound center, ABIs Jan  2013 WNL  Plan-MD to see, echo ordered, continue beta blocker, asa, add nitro patch. Start statin when she can take po meds.    Corine Shelter PA-C 06/04/2012, 11:05 AM    Patient seen and examined. Agree with assessment and plan. Echo shows EF 35-40% with moderate hypokinesis of inferolateral myocardium; Grade 2 diastolic dysfunction; AV sclerosis with mild AR, moderate MR, and mild-moderate TR.  Cardiac enzymes mildly positive. Pt now extubated, on antibiotics for pneumonia.  Will ultimately need definitive cardiac cath with reduced EF, WMA inferolaterally and positive troponins. ? Fri or Monday, depending on pulmonary status.   Lennette Bihari, MD, [facility] 06/04/2012 4:46 PM

## 2012-06-04 NOTE — Procedures (Signed)
Extubation Procedure Note  Patient Details:   Name: COUTNEY WILDERMUTH DOB: [birth year] MRN: [id number]   Airway Documentation:  Airway 7.5 mm (Active)  Secured at (cm) 23 cm 06/04/2012 11:42 AM  Measured From Lips 06/04/2012 11:42 AM  Secured Location Right 06/04/2012 11:42 AM  Secured By [employer] 06/04/2012 11:42 AM  Tube Holder Repositioned Yes 06/04/2012  8:00 AM  Cuff Pressure (cm H2O) 22 cm H2O 06/04/2012 11:42 AM  Site Condition Dry 06/04/2012  8:00 AM    Evaluation  O2 sats: stable throughout Complications: No apparent complications Patient did tolerate procedure well. Bilateral Breath Sounds: Clear;Diminished Suctioning: Airway Yes Pt extubated to a 4lpm Staley, Sp02 100%, Pt alert, oriented, & able to vocalize.  Melanee Spry 06/04/2012, 12:21 PM

## 2012-06-04 NOTE — Progress Notes (Signed)
ANTICOAGULATION CONSULT NOTE   Pharmacy Consult for Heparin Indication: chest pain/ACS  No Known Allergies  Patient Measurements: Height: 5\' 4"  (162.6 cm) Weight: 176 lb 2.4 oz (79.9 kg) IBW/kg (Calculated) : 54.7  Heparin Dosing Weight: 70Kg  Vital Signs: Temp: 100.3 F (37.9 C) (07/03 0500) Temp src: Core (Comment) (07/02 2300) BP: 118/60 mmHg (07/03 0500) Pulse Rate: 92  (07/03 0500)  Labs:  Basename 06/04/12 0440 06/04/12 0200 06/03/12 2000 06/03/12 1540 06/03/12 1420 06/03/12 1413 06/03/12 1412 06/03/12 0731  HGB 10.2* -- -- -- 10.9* -- -- --  HCT 31.4* -- -- -- 34.1* -- -- 33.8*  PLT 234 -- -- -- 254 -- -- 268  APTT -- -- -- 35 -- 124* -- --  LABPROT -- -- -- -- -- 15.7* -- --  INR -- -- -- -- -- 1.22 -- --  HEPARINUNFRC 0.30 -- -- -- -- -- -- --  CREATININE -- -- -- -- -- 0.78 -- 0.91  CKTOTAL -- 96 111 -- -- -- 110 --  CKMB -- 4.2* 6.4* -- -- -- 6.1* --  TROPONINI -- 0.81* 1.15* -- -- -- 1.22* --    Estimated Creatinine Clearance: 59.3 ml/min (by C-G formula based on Cr of 0.78).  Assessment: 78 yo female with VDRF, ACS for Heparin  Goal of Therapy:  Heparin level 0.3-0.7 units/ml Monitor platelets by anticoagulation protocol: Yes   Plan:  Continue Heparin at current rate  F/ U plan  Levonne Carreras, Gary Fleet 06/04/2012,5:35 AM

## 2012-06-04 NOTE — Progress Notes (Signed)
Subjective: Uncomfortable with endotracheal tube in place. No recurrence of seizure activity.  Objective: Current vital signs: BP 151/77  Pulse 112  Temp 100.1 F (37.8 C) (Core (Comment))  Resp 26  Ht 5\' 4"  (1.626 m)  Wt 79.9 kg (176 lb 2.4 oz)  BMI 30.24 kg/m2  SpO2 100%  Neurologic Exam: Alert and in moderate discomfort from endotracheal tube. No other signs of distress. Patient follow commands well. Pupils were equal and reacted normally to light. Extraocular movements were full and conjugate. Face was symmetrical with no indications of facial weakness. Moves extremities equally. Muscle tone was normal throughout. Deep tendon reflexes were 1+ and symmetrical.  CSF was unremarkable with no signs of acute meningitis.  Studies/Results:   Medications:  Scheduled:   . antiseptic oral rinse  15 mL Mouth Rinse QID  . aspirin  324 mg Oral NOW   Or  . aspirin  300 mg Rectal NOW  . aspirin  324 mg Oral NOW  . azithromycin  500 mg Intravenous Q24H  . cefTRIAXone (ROCEPHIN)  IV  1 g Intravenous Q12H  . chlorhexidine  15 mL Mouth Rinse BID  . fentaNYL      . fentaNYL  100 mcg Intravenous Once  . fentaNYL  50 mcg Intravenous Once  . insulin aspart  0-4 Units Subcutaneous Q4H  . levetiracetam  1,000 mg Intravenous Once  . levetiracetam  500 mg Intravenous Q12H  . lidocaine (cardiac) 100 mg/46ml      . lidocaine      . magnesium sulfate 1 - 4 g bolus IVPB  2 g Intravenous Once  . midazolam  1 mg Intravenous Once  . pantoprazole (PROTONIX) IV  40 mg Intravenous Q24H  . rocuronium      . DISCONTD: cefTRIAXone (ROCEPHIN)  IV  1 g Intravenous Q24H  . DISCONTD: heparin  5,000 Units Subcutaneous Q8H   Continuous:   . sodium chloride 20 mL/hr (06/03/12 1300)  . dextrose    . heparin 1,000 Units/hr (06/03/12 2105)  . propofol Stopped (06/04/12 0700)  . DISCONTD: propofol 20 mcg/kg/min (06/04/12 0130)  . DISCONTD: propofol    . DISCONTD: propofol 30 mcg/kg/min (06/04/12 0300)     ZOX:WRUEAV chloride, acetaminophen (TYLENOL) oral liquid 160 mg/5 mL, dextrose, fentaNYL, ondansetron (ZOFRAN) IV  Assessment/Plan: Generalized seizure in the setting of pneumonia and possible sepsis and febrile state. Etiology for seizure is unclear. CSF was unremarkable with no signs of acute meningitis. CT scan of her head showed no signs of acute intracranial abnormality as well. Patient is alert at this point with no indications of focal nor diffuse CNS deficit.  Recommendations: 1. Continue Keppra for now 500 mg every 12 hours. 2. EEG, routine is planned. 3. MRI of the brain without and with contrast.  C.R. Roseanne Reno, MD [facility] [phone]  06/04/2012  10:30 AM

## 2012-06-04 NOTE — Progress Notes (Signed)
ANTICOAGULATION CONSULT NOTE- follow up   Pharmacy Consult for Heparin Indication: chest pain/ACS  No Known Allergies  Patient Measurements: Height: 5\' 4"  (162.6 cm) Weight: 176 lb 2.4 oz (79.9 kg) IBW/kg (Calculated) : 54.7  Heparin Dosing Weight: 70Kg  Vital Signs: Temp: 100.1 F (37.8 C) (07/03 0900) Temp src: Core (Comment) (07/03 1000) BP: 149/75 mmHg (07/03 1142) Pulse Rate: 98  (07/03 1142)  Labs:  Basename 06/04/12 1413 06/04/12 0440 06/04/12 0200 06/03/12 2000 06/03/12 1540 06/03/12 1420 06/03/12 1413 06/03/12 1412 06/03/12 0731  HGB -- 10.2* -- -- -- 10.9* -- -- --  HCT -- 31.4* -- -- -- 34.1* -- -- 33.8*  PLT -- 234 -- -- -- 254 -- -- 268  APTT -- -- -- -- 35 -- 124* -- --  LABPROT -- -- -- -- -- -- 15.7* -- --  INR -- -- -- -- -- -- 1.22 -- --  HEPARINUNFRC 0.37 0.30 -- -- -- -- -- -- --  CREATININE -- 0.74 -- -- -- -- 0.78 -- 0.91  CKTOTAL -- -- 96 111 -- -- -- 110 --  CKMB -- -- 4.2* 6.4* -- -- -- 6.1* --  TROPONINI -- -- 0.81* 1.15* -- -- -- 1.22* --    Estimated Creatinine Clearance: 59.3 ml/min (by C-G formula based on Cr of 0.74).  Assessment: Patient continues on heparin for NSTEMI, heparin level remains therapeutic. H/H and plts stable, no bleeding- confirmed with RN.  Goal of Therapy:  Heparin level 0.3-0.7 units/ml Monitor platelets by anticoagulation protocol: Yes   Plan:  - Continue Heparin at 1000 units/hr - will f/up daily heparin level and CBC   Damiel Barthold K. Allena Katz, PharmD, BCPS.  Clinical Pharmacist Pager [phone]. 06/04/2012 2:47 PM

## 2012-06-05 ENCOUNTER — Encounter (HOSPITAL_COMMUNITY): Payer: Self-pay | Admitting: Cardiology

## 2012-06-05 ENCOUNTER — Inpatient Hospital Stay (HOSPITAL_COMMUNITY): Payer: Medicare Other

## 2012-06-05 DIAGNOSIS — I428 Other cardiomyopathies: Secondary | ICD-10-CM

## 2012-06-05 DIAGNOSIS — I519 Heart disease, unspecified: Secondary | ICD-10-CM

## 2012-06-05 DIAGNOSIS — R569 Unspecified convulsions: Secondary | ICD-10-CM

## 2012-06-05 DIAGNOSIS — I429 Cardiomyopathy, unspecified: Secondary | ICD-10-CM | POA: Diagnosis present

## 2012-06-05 DIAGNOSIS — I071 Rheumatic tricuspid insufficiency: Secondary | ICD-10-CM

## 2012-06-05 DIAGNOSIS — I340 Nonrheumatic mitral (valve) insufficiency: Secondary | ICD-10-CM | POA: Diagnosis present

## 2012-06-05 HISTORY — DX: Cardiomyopathy, unspecified: I42.9

## 2012-06-05 HISTORY — DX: Rheumatic tricuspid insufficiency: I07.1

## 2012-06-05 HISTORY — DX: Heart disease, unspecified: I51.9

## 2012-06-05 LAB — GLUCOSE, CAPILLARY
Glucose-Capillary: 127 mg/dL — ABNORMAL HIGH (ref 70–99)
Glucose-Capillary: 144 mg/dL — ABNORMAL HIGH (ref 70–99)
Glucose-Capillary: 102 mg/dL — ABNORMAL HIGH (ref 70–99)

## 2012-06-05 LAB — CBC
MCH: 25.1 pg — ABNORMAL LOW (ref 26.0–34.0)
MCHC: 32.3 g/dL (ref 30.0–36.0)
Platelets: 222 10*3/uL (ref 150–400)
RDW: 14 % (ref 11.5–15.5)

## 2012-06-05 LAB — BASIC METABOLIC PANEL
Calcium: 8 mg/dL — ABNORMAL LOW (ref 8.4–10.5)
GFR calc non Af Amer: 84 mL/min — ABNORMAL LOW (ref >90)
Sodium: 140 mEq/L (ref 135–145)

## 2012-06-05 LAB — CULTURE, RESPIRATORY W GRAM STAIN

## 2012-06-05 MED ORDER — SODIUM CHLORIDE 0.9 % IV SOLN: INTRAVENOUS | Status: DC

## 2012-06-05 MED ORDER — SODIUM CHLORIDE 0.9 % IV SOLN: 250.0000 mL | INTRAVENOUS | Status: DC | PRN

## 2012-06-05 MED ORDER — ASPIRIN 81 MG PO CHEW
324.0000 mg | CHEWABLE_TABLET | ORAL | Status: AC
  Administered 2012-06-06: 324 mg via ORAL
  Filled 2012-06-05: qty 4

## 2012-06-05 MED ORDER — METOPROLOL TARTRATE 25 MG PO TABS
25.0000 mg | ORAL_TABLET | Freq: Three times a day (TID) | ORAL | Status: DC
  Administered 2012-06-05 – 2012-06-06 (×6): 25 mg via ORAL
  Filled 2012-06-05 (×10): qty 1

## 2012-06-05 MED ORDER — INSULIN ASPART 100 UNIT/ML ~~LOC~~ SOLN
0.0000 [IU] | Freq: Three times a day (TID) | SUBCUTANEOUS | Status: DC
  Administered 2012-06-05 – 2012-06-06 (×3): 1 [IU] via SUBCUTANEOUS
  Administered 2012-06-07: 2 [IU] via SUBCUTANEOUS
  Administered 2012-06-07: 1 [IU] via SUBCUTANEOUS
  Administered 2012-06-08: 2 [IU] via SUBCUTANEOUS
  Administered 2012-06-08: 1 [IU] via SUBCUTANEOUS
  Administered 2012-06-09: 2 [IU] via SUBCUTANEOUS

## 2012-06-05 MED ORDER — SODIUM CHLORIDE 0.9 % IJ SOLN: 3.0000 mL | INTRAMUSCULAR | Status: DC | PRN

## 2012-06-05 MED ORDER — PANTOPRAZOLE SODIUM 40 MG PO TBEC
40.0000 mg | DELAYED_RELEASE_TABLET | Freq: Every day | ORAL | Status: DC
  Administered 2012-06-05 – 2012-06-13 (×9): 40 mg via ORAL
  Filled 2012-06-05 (×9): qty 1

## 2012-06-05 MED ORDER — METOPROLOL TARTRATE 25 MG PO TABS
25.0000 mg | ORAL_TABLET | Freq: Two times a day (BID) | ORAL | Status: DC
  Filled 2012-06-05 (×2): qty 1

## 2012-06-05 MED ORDER — GADOBENATE DIMEGLUMINE 529 MG/ML IV SOLN
15.0000 mL | Freq: Once | INTRAVENOUS | Status: AC
  Administered 2012-06-05: 15 mL via INTRAVENOUS

## 2012-06-05 MED ORDER — SODIUM CHLORIDE 0.9 % IJ SOLN
3.0000 mL | Freq: Two times a day (BID) | INTRAMUSCULAR | Status: DC
  Administered 2012-06-06 (×2): 3 mL via INTRAVENOUS

## 2012-06-05 NOTE — Progress Notes (Signed)
Subjective: No complaints. No recurrence of seizure activity.  Objective: Current vital signs: BP 137/78  Pulse 112  Temp 99.8 F (37.7 C) (Core (Comment))  Resp 17  Ht 5\' 4"  (1.626 m)  Wt 77.5 kg (170 lb 13.7 oz)  BMI 29.33 kg/m2  SpO2 100%  Neurologic Exam: Extubated and in no acute distress. Patient is well-oriented to time as well as place. Mental status is normal. Extraocular movements were full and conjugate. No facial weakness. Normal speech. Moves extremities well with equal strength and normal coordination.  Medications:  Scheduled:   . antiseptic oral rinse  15 mL Mouth Rinse QID  . azithromycin  500 mg Intravenous Q24H  . cefTRIAXone (ROCEPHIN)  IV  1 g Intravenous Q12H  . chlorhexidine  15 mL Mouth Rinse BID  . insulin aspart  0-9 Units Subcutaneous TID WC  . levetiracetam  500 mg Intravenous Q12H  . magnesium sulfate 1 - 4 g bolus IVPB  4 g Intravenous Once  . metoprolol tartrate  25 mg Oral TID  . nitroGLYCERIN  0.5 inch Topical Q8H  . pantoprazole  40 mg Oral Q1200  . potassium chloride  10 mEq Intravenous Q1 Hr x 2  . DISCONTD: aspirin  300 mg Rectal Daily  . DISCONTD: insulin aspart  0-4 Units Subcutaneous Q4H  . DISCONTD: metoprolol  5 mg Intravenous Q12H  . DISCONTD: metoprolol tartrate  25 mg Per Tube BID  . DISCONTD: metoprolol tartrate  25 mg Oral BID  . DISCONTD: pantoprazole (PROTONIX) IV  40 mg Intravenous Q24H   [id number] chloride, acetaminophen (TYLENOL) oral liquid 160 mg/5 mL, fentaNYL, DISCONTD: dextrose  Assessment/Plan: Generalized seizure of unclear etiology in setting of acute pneumonia, febrile state and possible sepsis. No evidence of CNS infection. No recurrence of seizure activity. EEG results are pending.  Recommend continuing Keppra 500 mg twice a day for seizure control. If EEG shows no indications of primary seizure disorder, Keppra will be discontinued. Also recommend MRI of the brain without and with contrast media.  C.R.  Roseanne Reno, MD [facility]  06/05/2012  9:21 AM

## 2012-06-05 NOTE — Progress Notes (Signed)
ANTICOAGULATION CONSULT NOTE- follow up   Pharmacy Consult for Heparin Indication: chest pain/ACS  No Known Allergies  Patient Measurements: Height: 5\' 4"  (162.6 cm) Weight: 170 lb 13.7 oz (77.5 kg) IBW/kg (Calculated) : 54.7  Heparin Dosing Weight: 70Kg  Vital Signs: Temp: 99.6 F (37.6 C) (07/04 0700) BP: 137/76 mmHg (07/04 0700) Pulse Rate: 103  (07/04 0700)  Labs:  Basename 06/05/12 0500 06/04/12 1413 06/04/12 0440 06/04/12 0200 06/03/12 2000 06/03/12 1540 06/03/12 1420 06/03/12 1413 06/03/12 1412  HGB 9.3* -- 10.2* -- -- -- -- -- --  HCT 28.8* -- 31.4* -- -- -- 34.1* -- --  PLT 222 -- 234 -- -- -- 254 -- --  APTT -- -- -- -- -- 35 -- 124* --  LABPROT -- -- -- -- -- -- -- 15.7* --  INR -- -- -- -- -- -- -- 1.22 --  HEPARINUNFRC 0.31 0.37 0.30 -- -- -- -- -- --  CREATININE 0.63 -- 0.74 -- -- -- -- 0.78 --  CKTOTAL -- -- -- 96 111 -- -- -- 110  CKMB -- -- -- 4.2* 6.4* -- -- -- 6.1*  TROPONINI -- -- -- 0.81* 1.15* -- -- -- 1.22*    Estimated Creatinine Clearance: 58.4 ml/min (by C-G formula based on Cr of 0.63).  Assessment: Patient continues on heparin for NSTEMI, heparin level remains therapeutic. H/H and plts stable, no bleeding noted.  Goal of Therapy:  Heparin level 0.3-0.7 units/ml Monitor platelets by anticoagulation protocol: Yes   Plan:  - Continue Heparin at 1000 units/hr - will f/up daily heparin level and CBC    Murry Khiev L. Illene Bolus, PharmD, BCPS Clinical Pharmacist Pager: [phone] 06/05/2012 7:56 AM

## 2012-06-05 NOTE — Evaluation (Signed)
Physical Therapy Evaluation Patient Details Name: [name] MRN: [id number] DOB: Jul 07, 1931 Today's Date: 06/05/2012 Time: 4132-4401 PT Time Calculation (min): 17 min  PT Assessment / Plan / Recommendation Clinical Impression  Patient s/p NSTEMI and respiratory failure with decr mobility secondary to decr endurance after being extubated and having MI. Limited today secondary to incr HR with activity.   Will benefit from PT to address mobility and progress to independence.      PT Assessment  Patient needs continued PT services    Follow Up Recommendations  Home health PT;Supervision/Assistance - 24 hour    Barriers to Discharge  None      Equipment Recommendations  None recommended by PT    Recommendations for Other Services OT consult   Frequency Min 3X/week    Precautions / Restrictions Precautions Precautions: Fall Restrictions Weight Bearing Restrictions: No   Pertinent Vitals/Pain HR incr from 119-140 with ambulation, no pain      Mobility  Bed Mobility Bed Mobility: Rolling Left;Left Sidelying to Sit;Sitting - Scoot to Edge of Bed Rolling Left: 4: Min guard Left Sidelying to Sit: 4: Min guard Sitting - Scoot to [place] of Bed: 4: Min guard Details for Bed Mobility Assistance: cues for technique and sequence.  Transfers Transfers: Sit to Stand;Stand to Sit Sit to Stand: 4: Min assist;From bed;With upper extremity assist Stand to Sit: 4: Min assist;With upper extremity assist;With armrests;To chair/3-in-1 Details for Transfer Assistance: Patient needed cues for hand placement for sit to stand.  Needed a little assist for forward translation. Ambulation/Gait Ambulation/Gait Assistance: 1: +2 Total assist Ambulation/Gait: Patient Percentage: 80% Ambulation Distance (Feet): 5 Feet Assistive device: Rolling walker Ambulation/Gait Assistance Details: Patient needed cues for sequencing steps and RW and to stay close to RW.  Forward flexed posture.   Gait Pattern:  Step-through pattern;Decreased stride length;Trunk flexed Gait velocity: Decreased Stairs: No Wheelchair Mobility Wheelchair Mobility: No         PT Diagnosis: Generalized weakness  PT Problem List: Decreased activity tolerance;Decreased balance;Decreased mobility;Decreased knowledge of use of DME;Decreased safety awareness;Cardiopulmonary status limiting activity PT Treatment Interventions: DME instruction;Gait training;Functional mobility training;Therapeutic activities;Therapeutic exercise;Balance training;Stair training;Patient/family education   PT Goals Acute Rehab PT Goals PT Goal Formulation: With patient Time For Goal Achievement: 06/19/12 Potential to Achieve Goals: Good Pt will go Supine/Side to Sit: with modified independence PT Goal: Supine/Side to Sit - Progress: Goal set today Pt will go Sit to Stand: with modified independence;with upper extremity assist PT Goal: Sit to Stand - Progress: Goal set today Pt will Ambulate: with modified independence;with least restrictive assistive device;51 - 150 feet PT Goal: Ambulate - Progress: Goal set today Pt will Go Up / Down Stairs: 3-5 stairs;with min assist;with least restrictive assistive device PT Goal: Up/Down Stairs - Progress: Goal set today  Visit Information  Last PT Received On: 06/05/12 Assistance Needed: +2    Subjective Data  Subjective: "I use a cane."   Prior Functioning  Home Living Lives With: Son Available Help at Discharge: Family;Available 24 hours/day Type of Home: House Home Access: Stairs to enter [employer] of Steps: 5 Entrance Stairs-Rails: Left Home Layout: One level Bathroom Shower/Tub: Tub/shower unit (sponge bathe) Bathroom Toilet: Standard Home Adaptive Equipment: Bedside commode/3-in-1;Shower chair without back;Hand-held shower hose;Straight cane;Walker - rolling Additional Comments: Ambulated with cane PTA Prior Function Level of Independence: Independent with assistive  device(s) Able to Take Stairs?: Yes Driving: No Vocation: Retired Dominant Hand: Right    Cognition  Overall Cognitive Status: Appears within  functional limits for tasks assessed/performed Arousal/Alertness: Awake/alert Orientation Level: Appears intact for tasks assessed Behavior During Session: [facility] for tasks performed    Extremity/Trunk Assessment Right Upper Extremity Assessment RUE ROM/Strength/Tone: WFL for tasks assessed RUE Sensation: WFL - Light Touch RUE Coordination: WFL - gross/fine motor Left Upper Extremity Assessment LUE ROM/Strength/Tone: WFL for tasks assessed LUE Sensation: WFL - Light Touch LUE Coordination: WFL - gross/fine motor Right Lower Extremity Assessment RLE ROM/Strength/Tone: WFL for tasks assessed RLE Sensation: WFL - Light Touch RLE Coordination: WFL - gross/fine motor Left Lower Extremity Assessment LLE ROM/Strength/Tone: WFL for tasks assessed LLE Sensation: WFL - Light Touch LLE Coordination: WFL - gross/fine motor Trunk Assessment Trunk Assessment: Normal   Balance Balance Balance Assessed: Yes Static Sitting Balance Static Sitting - Balance Support: Feet supported;No upper extremity supported Static Sitting - Level of Assistance: 6: Modified independent (Device/Increase time) Static Sitting - Comment/# of Minutes: 2  End of Session PT - End of Session Equipment Utilized During Treatment: Gait belt Activity Tolerance: Patient tolerated treatment well Patient left: in chair;with call bell/phone within reach Nurse Communication: Mobility status       [name],Makahla Kiser 06/05/2012, 11:00 AM  [facility] [phone] (pager)

## 2012-06-05 NOTE — Progress Notes (Signed)
Portable EEG completed

## 2012-06-05 NOTE — Procedures (Signed)
EEG NUMBER:  [id number].  This routine EEG was requested in a 78 year old woman, admitted with new onset seizure and acute respiratory failure.  Medications include levetiracetam.  EEG was done with the patient awake and drowsy.  During periods of maximal wakefulness, she had a very low amplitude 8-9 cycle per second, posterior dominant rhythm that attenuated with eye opening was symmetric.  Background activities are composed of very low amplitude and beta activities that were symmetric.  Photic stimulation did not produce a driving response.  Hyperventilation was not performed.  The patient did become drowsy as characterized by the onset of slow roving eye movements and attenuation of background muscle activity as well as attenuation in alpha rhythm.  There were bursts of slower theta and delta frequency activities that were symmetric, however.  Stage II sleep was not reached.  CLINICAL INTERPRETATION:  This routine EEG done with the patient awake and drowsy while very low amplitude, felt to be normal.  No interictal epileptiform discharges were seen.          ______________________________ Denton Meek, MD    ZO:[id number] D:  06/05/2012 20:58:02  T:  06/05/2012 21:06:40  Job #:  [id number]

## 2012-06-05 NOTE — Progress Notes (Signed)
Patient transferred to 2028. Report given to [place], rn receiving the patient.

## 2012-06-05 NOTE — Progress Notes (Signed)
Subjective: Extubated, no chest pain.  No SOB, occ cough  Objective: Vital signs in last 24 hours: Temp:  [99.5 F (37.5 C)-100.9 F (38.3 C)] 99.6 F (37.6 C) (07/04 0700) Pulse Rate:  [88-116] 103  (07/04 0700) Resp:  [15-28] 23  (07/04 0700) BP: (111-151)/(55-109) 137/76 mmHg (07/04 0700) SpO2:  [100 %] 100 % (07/04 0700) FiO2 (%):  [40 %] 40 % (07/03 1200) Weight:  [77.5 kg (170 lb 13.7 oz)] 77.5 kg (170 lb 13.7 oz) (07/04 0500) Weight change: 2.7 kg (5 lb 15.2 oz)   Intake/Output from previous day:  -1886   Wt. 77.5 up form 74.8 on admit but down from 79.9 yest.  07/03 0701 - 07/04 0700 In: 1310 [I.V.:700; IV Piggyback:610] Out: 3110 [Urine:3110] Intake/Output this shift:    PE: General:alert and oriented, pleasant affect Heart:S1S2 RRR, tachy.  Lungs:few crackles in the bases, no wheezes UXL:KGMW, non tender, + BS Ext:no edema Neuro:A&O X 3, MAE, follows commands.   Lab Results:  Basename 06/05/12 0500 06/04/12 0440  WBC 14.6* 19.4*  HGB 9.3* 10.2*  HCT 28.8* 31.4*  PLT 222 234   BMET  Basename 06/05/12 0500 06/04/12 0440  NA 140 139  K 3.6 3.5  CL 105 104  CO2 26 24  GLUCOSE 129* 118*  BUN 9 12  CREATININE 0.63 0.74  CALCIUM 8.0* 8.4    Basename 06/04/12 0200 06/03/12 2000  TROPONINI 0.81* 1.15*    No results found for this basename: CHOL,  HDL,  LDLCALC,  LDLDIRECT,  TRIG,  CHOLHDL   No results found for this basename: HGBA1C     No results found for this basename: TSH    Hepatic Function Panel  Basename 06/03/12 0731  PROT 6.9  ALBUMIN 3.5  AST 17  ALT 6  ALKPHOS 62  BILITOT 0.4  BILIDIR --  IBILI --   No results found for this basename: CHOL in the last 72 hours No results found for this basename: PROTIME in the last 72 hours    EKG: Orders placed during the hospital encounter of 06/03/12  . EKG 12-LEAD  . EKG 12-LEAD  . ED EKG  . ED EKG  . EKG 12-LEAD  . EKG 12-LEAD    Studies/Results: Ct Head Wo Contrast  06/03/2012   *RADIOLOGY REPORT*  Clinical Data: Productive cough.  Pneumonia.  Unresponsive  CT HEAD WITHOUT CONTRAST  Technique:  Contiguous axial images were obtained from the base of the skull through the vertex without contrast.  Comparison: None.  Findings: No acute intracranial hemorrhage.  No focal mass lesion. No CT evidence of acute infarction.   No midline shift or mass effect.  No hydrocephalus.  Basilar cisterns are patent.  There is generalized cortical atrophy.  There is mild periventricular and subcortical white matter hypodensities. Paranasal sinuses and mastoid air cells are clear.  Orbits are normal.  IMPRESSION:  1.  No acute intracranial findings.  2.  Atrophy and microvascular disease.  Original Report Authenticated By: Genevive Bi, M.D.   Dg Chest Port 1 View  06/05/2012  *RADIOLOGY REPORT*  Clinical Data: Airspace disease, pneumonia, extubated  PORTABLE CHEST - 1 VIEW  Comparison: 06/04/2012  Findings: Interval extubation and NG tube removal.  Right subclavian central line tip in the upper SVC.  Heart remains enlarged with central vascular congestion.  Airspace disease persist in the lingula and left lower lobe obscuring the left cardiac border and hemidiaphragm.  Basilar pneumonia not excluded. No enlarging effusion or pneumothorax.  IMPRESSION: Interval extubation.  Persistent lingula and left lower lobe airspace consolidation versus pneumonia  Original Report Authenticated By: Judie Petit. Ruel Favors, M.D.   Portable Chest Xray In Am  06/04/2012  *RADIOLOGY REPORT*  Clinical Data: Assess endotracheal tube.  PORTABLE CHEST - 1 VIEW  Comparison: 06/03/2012.  Findings: Endotracheal tube is in satisfactory position.  Right subclavian central line tip projects over the SVC.  Heart size stable.  Thoracic aorta is calcified.  There has been some interval improvement in aeration at the right lung base.  Mild diffuse bilateral air space disease, left greater than right, persists. Both lower lobe consolidation  may have improved slightly in the interval as well.  Probable small bilateral pleural effusions.  IMPRESSION:  1.  Mild diffuse bilateral air space disease, left greater than right, with some improvement in bibasilar aeration. 2.  Small bilateral pleural effusions.  Original Report Authenticated By: Reyes Ivan, M.D.   Dg Chest Port 1 View  06/03/2012  *RADIOLOGY REPORT*  Clinical Data: Status post right subclavian catheter placement. Evaluate for potential pneumothorax.  PORTABLE CHEST - 1 VIEW  Comparison: Chest x-ray 06/03/2012.  Findings: An endotracheal tube is in place with tip 4.8 cm above the carina. There is a right-sided subclavian central venous catheter with tip terminating in the mid superior vena cava. A nasogastric tube is seen extending into the stomach, however, the tip of the nasogastric tube extends below the lower margin of the image.  No definite pneumothorax is identified.  Lung volumes are slightly low.  There is slight worsening aeration throughout the mid and lower lungs bilaterally, compatible with areas of increasing atelectasis and/or consolidation.  Blunting of both costophrenic sulci is compatible with small bilateral pleural effusions (left greater than right).  Pulmonary vasculature does not appear engorged.  Heart size is upper limits of normal. The patient is rotated to the left on today's exam, resulting in distortion of the mediastinal contours and reduced diagnostic sensitivity and specificity for mediastinal pathology. Atherosclerotic calcifications within the arch of the aorta.  IMPRESSION: 1.  Support apparatus, as above. 2.  No pneumothorax following right subclavian catheter placement. 3.  Slight worsening aeration in the mid and lower lungs bilaterally compatible with areas of increasing atelectasis and/or consolidation, with adjacent small bilateral pleural effusions (left greater than right). 4.  Atherosclerosis.  Original Report Authenticated By: Florencia Reasons, M.D.   Dg Chest Portable 1 View  06/03/2012  *RADIOLOGY REPORT*  Clinical Data: Intubation.  Pneumonia.  PORTABLE CHEST - 1 VIEW 06/03/2012 1004 hours:  Comparison: Portable chest x-ray earlier same date 0723 hours.  Findings: Endotracheal tube tip in satisfactory position approximately 5 cm above the carina.  Interval worsening of consolidation at the right lung base.  No significant change in the consolidation in the left lung.  IMPRESSION:  1.  Endotracheal tube tip in satisfactory position approximately 5 cm above the carina. 2.  Worsening pneumonia at the right lung base.  Stable left lung pneumonia since earlier in the day.  Original Report Authenticated By: Arnell Sieving, M.D.   2D ECHO: Left ventricle: The cavity size was normal. Wall thickness was normal. Systolic function was moderately reduced. The estimated ejection fraction was in the range of 35% to 40%. Moderate hypokinesis of the inferolateral myocardium. Features are consistent with a pseudonormal left ventricular filling pattern, with concomitant abnormal relaxation and increased filling pressure (grade 2 diastolic dysfunction). Doppler parameters are consistent with elevated mean left atrial filling pressure. - Aortic  valve: Trileaflet; mildly thickened, mildly calcified leaflets. Mild regurgitation. - Mitral valve: Moderate regurgitation. - Left atrium: The atrium was moderately to severely dilated. - Tricuspid valve: Mild-moderate regurgitation     Medications: I have reviewed the patient's current medications.    Marland Kitchen antiseptic oral rinse  15 mL Mouth Rinse QID  . azithromycin  500 mg Intravenous Q24H  . cefTRIAXone (ROCEPHIN)  IV  1 g Intravenous Q12H  . chlorhexidine  15 mL Mouth Rinse BID  . insulin aspart  0-9 Units Subcutaneous TID WC  . levetiracetam  500 mg Intravenous Q12H  . magnesium sulfate 1 - 4 g bolus IVPB  4 g Intravenous Once  . metoprolol tartrate  25 mg Oral BID  . nitroGLYCERIN   0.5 inch Topical Q8H  . pantoprazole  40 mg Oral Q1200  . potassium chloride  10 mEq Intravenous Q1 Hr x 2  . DISCONTD: aspirin  300 mg Rectal Daily  . DISCONTD: insulin aspart  0-4 Units Subcutaneous Q4H  . DISCONTD: metoprolol  5 mg Intravenous Q12H  . DISCONTD: metoprolol tartrate  25 mg Per Tube BID  . DISCONTD: pantoprazole (PROTONIX) IV  40 mg Intravenous Q24H   Assessment/Plan: Patient Active Problem List  Diagnosis  . Onychomycosis  . DM type 2 with diabetic peripheral neuropathy  . HTN (hypertension)  . Anxiety  . GERD (gastroesophageal reflux disease)  . CAP (community acquired pneumonia)  . Respiratory failure, acute  . Altered mental status  . Sepsis  . Venous ulcer LE, chronic, followed at wound center, ABIs Jan 2013 WNL  . NSTEMI, Troponin 1.22 06/03/12  . Cardiomyopathy, EF 35-40%  . Diastolic dysfunction, left ventricle, grade 2 by echo 06/04/12  . TR (tricuspid regurgitation), mod  . Mitral regurgitation, mod by echo 06/03/12   PLAN: For cardiac cath in am.  Discussed briefly with pt.  With NSTEMI and  Low EF.  Also with diastolic dysfunction.  VS stable.  IV Heparin continues.  Mild anemia  LOS: 2 days   [name],LAURA R 06/05/2012, 8:38 AM    Patient seen and examined. Agree with assessment and plan. No chest pain. Breathing better.  Remains tachycardic, will titrate cardioselective beta blockade. Tentatively plan cath tomorrow with NSTEMI.   Lennette Bihari, MD, [facility] 06/05/2012 9:05 AM

## 2012-06-05 NOTE — Progress Notes (Signed)
Name: [name] MRN: [id number] DOB: September 30, 1931    LOS: 2  Referring Provider:  EDP Reason for Referral:   Respiratory failure  PULMONARY / CRITICAL CARE MEDICINE  Brief patient description:  78 year old African female with sepsis pneumonia and respiratory failure and altered mental status  Events Since Admission: 7/2- Intubated in ED on admission  7/2- LP, CT head 7/3- improved neurostatus  Current Status: awake  Tubes/Lines: ETT 7/2 >>> R hand PIV 7/2 >>> L Hand PIV 7/2 >>> R Subclavian 7/2 >>> NG 7/2 >>>  Vital Signs: Temp:  [99.1 F (37.3 C)-100.9 F (38.3 C)] 99.1 F (37.3 C) (07/04 1113) Pulse Rate:  [88-115] 114  (07/04 1113) Resp:  [15-28] 19  (07/04 1113) BP: (111-147)/(55-109) 146/90 mmHg (07/04 1113) SpO2:  [99 %-100 %] 99 % (07/04 1113) FiO2 (%):  [40 %] 40 % (07/03 1200) Weight:  [77.5 kg (170 lb 13.7 oz)] 77.5 kg (170 lb 13.7 oz) (07/04 0500)  Physical Examination: General: [place] oxygen  Neuro: awake and alert  HEENT:   mucous membranes dry Neck:  Neck supple with jugular venous suspension not noted Cardiovascular:  Regular rate and rhythm normal S1-S2 no S3 or S4 Lungs:  clearer Abdomen:  Soft nontender bowel sounds hypoactive, no hepatosplenomegaly Musculoskeletal:  Full range of motion Skin:  Clear  Principal Problem:  *Respiratory failure, acute Active Problems:  DM type 2 with diabetic peripheral neuropathy  HTN (hypertension)  GERD (gastroesophageal reflux disease)  CAP (community acquired pneumonia)  Altered mental status  Sepsis  Venous ulcer LE, chronic, followed at wound center, ABIs Jan 2013 WNL  NSTEMI, Troponin 1.22 06/03/12  Cardiomyopathy, EF 35-40%  Diastolic dysfunction, left ventricle, grade 2 by echo 06/04/12  TR (tricuspid regurgitation), mod  Mitral regurgitation, mod by echo 06/03/12  Seizure, convulsive   ASSESSMENT AND PLAN  PULMONARY  Lab 06/04/12 0329 06/03/12 1047 06/03/12 0915  PHART 7.476* 7.214* 7.392    PCO2ART 30.4* 54.0* 38.2  PO2ART 122.0* 210.0* 53.0*  HCO3 22.2 21.8 23.3  O2SAT 99.0 100.0 87.0   Ventilator Settings: [place] oxygen  CXR:  Left lower lobe infiltrate and pulmonary edema ETT:  7/2>>7/3  A:  Left lower lobe can be community acquired pneumonia with acute respiratory failure P:   Pt is off vent.   Weaning Oxygen  CARDIOVASCULAR  Lab 06/04/12 0200 06/03/12 2000 06/03/12 1412 06/03/12 0737 06/03/12 0732  TROPONINI 0.81* 1.15* 1.22* -- --  LATICACIDVEN -- -- -- -- 3.4*  PROBNP -- -- -- 1421.0* --   ECG:  T wave inversion in V3-V6 Lines:  As above  A: T wave inversion with troponin elevation. No trending down P:  Heparin drip Full dose aspirin Assess Echo Cardiology consultation on board Beta blocker NTG patch started  RENAL  Lab 06/05/12 0500 06/04/12 0440 06/03/12 1413 06/03/12 0731  NA 140 139 -- 138  K 3.6 3.5 -- --  CL 105 104 -- 102  CO2 26 24 -- 25  BUN 9 12 -- 16  CREATININE 0.63 0.74 0.78 0.91  CALCIUM 8.0* 8.4 -- 9.4  MG -- 1.2* -- --  PHOS -- 2.6 -- --   Intake/Output      07/03 0701 - 07/04 0700 07/04 0701 - 07/05 0700   P.O.  240   I.V. (mL/kg) 700 (9) 90 (1.2)   NG/GT     IV Piggyback 610    Total Intake(mL/kg) 1310 (16.9) 330 (4.3)   Urine (mL/kg/hr) 3110 (1.7) 250 (0.7)  Total Output 3110 250   Net -1800 +80         Foley:  7/2  A: autodiuresing now P:   Monitor   GASTROINTESTINAL  Lab 06/03/12 0731  AST 17  ALT 6  ALKPHOS 62  BILITOT 0.4  PROT 6.9  ALBUMIN 3.5    A:   No active gastrointestinal issues  P:   - liquisds po    HEMATOLOGIC  Lab 06/05/12 0500 06/04/12 0440 06/03/12 1540 06/03/12 1420 06/03/12 1413 06/03/12 0731  HGB 9.3* 10.2* -- 10.9* -- 10.9*  HCT 28.8* 31.4* -- 34.1* -- 33.8*  PLT 222 234 -- 254 -- 268  INR -- -- -- -- 1.22 --  APTT -- -- 35 -- 124* --   A:  Mild anemia  P:  - CBC in am Hep IV to sub q when IV dc'ed  INFECTIOUS  Lab 06/05/12 0500 06/04/12 0440 06/03/12 1420  06/03/12 0731  WBC 14.6* 19.4* 21.3* 11.4*  PROCALCITON -- -- -- --   Cultures: Blood cultures x2 7/2>>> Resp c/s 7/2>>> UC 7/2>>> CSF 7/2>>>  Antibiotics: Rocephin 7/2 (CAP)>> Azithromycin 7/2 (CAP)>>  A:  Community acquired pneumonia coverage indicated. WBC trending down P:   - continue Rocephin and azithromycin  - f/u cutlures   ENDOCRINE  Lab 06/05/12 0738 06/05/12 0429 06/05/12 0025 06/04/12 2039 06/04/12 1649  GLUCAP 113* 127* 100* 153* 125*   A:  Diabetes type 2   P:   Change to SSI protocol  NEUROLOGIC  A:  Altered mental status unclear etiology, possible hypoxemia and infection. Much improved this am.  P:   - continue to monitor -OOB to chair  BEST PRACTICE / DISPOSITION Level of Care:  TELE  Primary Service:  PCCM service  Consultants:  Cardiology, Code Status:  Full  Diet: PO DVT Px:  Heparin, SCD GI Px: PO protonix Skin Integrity:  Good  Social / Family:        Austin Miles  [phone]  Cell  [phone]  If no response or cell goes to voicemail, call beeper [phone]   06/05/2012, 11:58 AM

## 2012-06-06 ENCOUNTER — Encounter (HOSPITAL_COMMUNITY)
Admission: EM | Disposition: A | Discharge status: Home Health | Payer: Self-pay | Source: Home / Self Care | Attending: Critical Care Medicine

## 2012-06-06 HISTORY — PX: ABDOMINAL ANGIOGRAM: SHX5499

## 2012-06-06 HISTORY — PX: CARDIAC CATHETERIZATION: SHX172

## 2012-06-06 HISTORY — PX: LEFT HEART CATHETERIZATION WITH CORONARY ANGIOGRAM: SHX5451

## 2012-06-06 LAB — GLUCOSE, CAPILLARY
Glucose-Capillary: 134 mg/dL — ABNORMAL HIGH (ref 70–99)
Glucose-Capillary: 126 mg/dL — ABNORMAL HIGH (ref 70–99)
Glucose-Capillary: 138 mg/dL — ABNORMAL HIGH (ref 70–99)

## 2012-06-06 LAB — COMPREHENSIVE METABOLIC PANEL
Albumin: 3.2 g/dL — ABNORMAL LOW (ref 3.5–5.2)
BUN: 6 mg/dL (ref 6–23)
Calcium: 8.6 mg/dL (ref 8.4–10.5)
Creatinine, Ser: 0.62 mg/dL (ref 0.50–1.10)
GFR calc Af Amer: >90 mL/min (ref >90)
Glucose, Bld: 133 mg/dL — ABNORMAL HIGH (ref 70–99)
Potassium: 3.5 mEq/L (ref 3.5–5.1)
Total Protein: 6.7 g/dL (ref 6.0–8.3)

## 2012-06-06 LAB — POCT ACTIVATED CLOTTING TIME: Activated Clotting Time: 130 seconds

## 2012-06-06 LAB — CBC
HCT: 30.1 % — ABNORMAL LOW (ref 36.0–46.0)
Hemoglobin: 9.8 g/dL — ABNORMAL LOW (ref 12.0–15.0)
MCH: 25.1 pg — ABNORMAL LOW (ref 26.0–34.0)
MCHC: 32.6 g/dL (ref 30.0–36.0)
RDW: 13.9 % (ref 11.5–15.5)

## 2012-06-06 LAB — PROTIME-INR
INR: 1.03 (ref 0.00–1.49)
Prothrombin Time: 13.7 seconds (ref 11.6–15.2)

## 2012-06-06 LAB — HEPARIN LEVEL (UNFRACTIONATED): Heparin Unfractionated: 0.2 IU/mL — ABNORMAL LOW (ref 0.30–0.70)

## 2012-06-06 SURGERY — LEFT HEART CATHETERIZATION WITH CORONARY ANGIOGRAM
Anesthesia: LOCAL

## 2012-06-06 MED ORDER — POTASSIUM CHLORIDE CRYS ER 20 MEQ PO TBCR
40.0000 meq | EXTENDED_RELEASE_TABLET | Freq: Once | ORAL | Status: AC
  Administered 2012-06-06: 20 meq via ORAL
  Filled 2012-06-06: qty 2

## 2012-06-06 MED ORDER — HEPARIN (PORCINE) IN NACL 2-0.9 UNIT/ML-% IJ SOLN
INTRAMUSCULAR | Status: AC
  Filled 2012-06-06: qty 2000

## 2012-06-06 MED ORDER — LIDOCAINE HCL (PF) 1 % IJ SOLN
INTRAMUSCULAR | Status: AC
  Filled 2012-06-06: qty 30

## 2012-06-06 MED ORDER — SODIUM CHLORIDE 0.9 % IJ SOLN
10.0000 mL | INTRAMUSCULAR | Status: DC | PRN
  Administered 2012-06-06 (×2): 10 mL
  Administered 2012-06-07 (×2): 30 mL
  Administered 2012-06-08 – 2012-06-09 (×9): 10 mL
  Administered 2012-06-10: 30 mL
  Administered 2012-06-11 (×4): 10 mL
  Administered 2012-06-12 – 2012-06-13 (×3): 30 mL

## 2012-06-06 MED ORDER — MIDAZOLAM HCL 2 MG/2ML IJ SOLN
INTRAMUSCULAR | Status: AC
  Filled 2012-06-06: qty 2

## 2012-06-06 MED ORDER — AZITHROMYCIN 500 MG PO TABS
500.0000 mg | ORAL_TABLET | Freq: Every day | ORAL | Status: AC
  Administered 2012-06-06 – 2012-06-07 (×2): 500 mg via ORAL
  Filled 2012-06-06 (×2): qty 1

## 2012-06-06 MED ORDER — FENTANYL CITRATE 0.05 MG/ML IJ SOLN
INTRAMUSCULAR | Status: AC
  Filled 2012-06-06: qty 2

## 2012-06-06 MED ORDER — BENZONATATE 100 MG PO CAPS
100.0000 mg | ORAL_CAPSULE | Freq: Two times a day (BID) | ORAL | Status: DC | PRN
  Administered 2012-06-06: 100 mg via ORAL
  Filled 2012-06-06 (×2): qty 1

## 2012-06-06 MED ORDER — ACETAMINOPHEN 325 MG PO TABS: 650.0000 mg | ORAL_TABLET | ORAL | Status: DC | PRN

## 2012-06-06 MED ORDER — SODIUM CHLORIDE 0.9 % IV SOLN
INTRAVENOUS | Status: DC
  Administered 2012-06-06 – 2012-06-08 (×3): via INTRAVENOUS

## 2012-06-06 MED ORDER — ALPRAZOLAM 0.25 MG PO TABS
0.2500 mg | ORAL_TABLET | Freq: Once | ORAL | Status: AC
  Administered 2012-06-06: 0.25 mg via ORAL
  Filled 2012-06-06: qty 1

## 2012-06-06 MED ORDER — NITROGLYCERIN 0.2 MG/ML ON CALL CATH LAB
INTRAVENOUS | Status: AC
  Filled 2012-06-06: qty 1

## 2012-06-06 MED ORDER — ONDANSETRON HCL 4 MG/2ML IJ SOLN: 4.0000 mg | Freq: Four times a day (QID) | INTRAMUSCULAR | Status: DC | PRN

## 2012-06-06 NOTE — Progress Notes (Signed)
Name: [name] MRN: [id number] DOB: 09-10-1931    LOS: 3  Referring Provider:  EDP Reason for Referral:   Respiratory failure  PULMONARY / CRITICAL CARE MEDICINE  Brief patient description:  78 year old African female with sepsis, LLL pneumonia and respiratory failure and altered mental status  Events Since Admission: 7/2- Intubated in ED on admission  7/2- LP, CT head 7/3- improved neurostatus 7/4 MRI 7/4 no infarct, Poor delineation of the left vertebral artery which may be  occluded, congenitally small or ending in a PICA distribution. EEG 7/4 neg   Current Status: awake  Tubes/Lines: ETT 7/2 >>> 7/3 R hand PIV 7/2 >>> L Hand PIV 7/2 >>> R Subclavian 7/2 >>> NG 7/2 >>>out  Vital Signs: Temp:  [98.8 F (37.1 C)-99.7 F (37.6 C)] 98.8 F (37.1 C) (07/05 0618) Pulse Rate:  [85-114] 101  (07/05 1102) Resp:  [19-28] 21  (07/05 0618) BP: (144-153)/(85-93) 153/93 mmHg (07/05 1102) SpO2:  [97 %-99 %] 98 % (07/05 0618) Weight:  [78.1 kg (172 lb 2.9 oz)] 78.1 kg (172 lb 2.9 oz) (07/05 4098)  Physical Examination: General: [name] oxygen  Neuro: awake and alert  HEENT:   mucous membranes dry Neck:  Neck supple with jugular venous suspension not noted Cardiovascular:  Regular rate and rhythm normal S1-S2 no S3 or S4 Lungs:  clearer Abdomen:  Soft nontender bowel sounds hypoactive, no hepatosplenomegaly Musculoskeletal:  Full range of motion Skin:  Clear  Principal Problem:  *Respiratory failure, acute Active Problems:  DM type 2 with diabetic peripheral neuropathy  HTN (hypertension)  GERD (gastroesophageal reflux disease)  CAP (community acquired pneumonia)  Altered mental status  Sepsis  Venous ulcer LE, chronic, followed at wound center, ABIs Jan 2013 WNL  NSTEMI, Troponin 1.22 06/03/12  Cardiomyopathy, EF 35-40%  Diastolic dysfunction, left ventricle, grade 2 by echo 06/04/12  TR (tricuspid regurgitation), mod  Mitral regurgitation, mod by echo 06/03/12  Seizure, convulsive   ASSESSMENT AND PLAN  PULMONARY  Lab 06/04/12 0329 06/03/12 1047 06/03/12 0915  PHART 7.476* 7.214* 7.392  PCO2ART 30.4* 54.0* 38.2  PO2ART 122.0* 210.0* 53.0*  HCO3 22.2 21.8 23.3  O2SAT 99.0 100.0 87.0   Ventilator Settings:  oxygen  CXR:  Left lower lobe infiltrate and pulmonary edema ETT:  7/2>>7/3  A:  Left lower lobe can be community acquired pneumonia with acute respiratory failure P:   improved  CARDIOVASCULAR  Lab 06/04/12 0200 06/03/12 2000 06/03/12 1412 06/03/12 0737 06/03/12 0732  TROPONINI 0.81* 1.15* 1.22* -- --  LATICACIDVEN -- -- -- -- 3.4*  PROBNP -- -- -- 1421.0* --   ECG:  T wave inversion in V3-V6 Lines:  As above  A: T wave inversion with troponin elevation. No trending down P:  Heparin drip, Full dose aspirin Assess Echo Cardiology consulting- cath today Beta blocker NTG patch started  RENAL  Lab 06/06/12 0612 06/05/12 0500 06/04/12 0440 06/03/12 1413 06/03/12 0731  NA 140 140 139 -- 138  K 3.5 3.6 -- -- --  CL 105 105 104 -- 102  CO2 23 26 24  -- 25  BUN 6 9 12  -- 16  CREATININE 0.62 0.63 0.74 0.78 0.91  CALCIUM 8.6 8.0* 8.4 -- 9.4  MG -- -- 1.2* -- --  PHOS -- -- 2.6 -- --   Intake/Output      07/04 0701 - 07/05 0700 07/05 0701 - 07/06 0700   P.O. 340    I.V. (mL/kg) 170 (2.2)    IV Piggyback  Total Intake(mL/kg) 510 (6.5)    Urine (mL/kg/hr) 800 (0.4)    Stool 2    Total Output 802    Net -292          Foley:  7/2  A: autodiuresing now P:   Monitor     HEMATOLOGIC  Lab 06/06/12 0612 06/05/12 0500 06/04/12 0440 06/03/12 1540 06/03/12 1420 06/03/12 1413 06/03/12 0731  HGB 9.8* 9.3* 10.2* -- 10.9* -- 10.9*  HCT 30.1* 28.8* 31.4* -- 34.1* -- 33.8*  PLT 246 222 234 -- 254 -- 268  INR 1.03 -- -- -- -- 1.22 --  APTT -- -- -- 35 -- 124* --   A:  Mild anemia  P:  - CBC in am Hep IV to sub q when IV dc'ed  INFECTIOUS  Lab 06/06/12 0612 06/05/12 0500 06/04/12 0440 06/03/12 1420 06/03/12  0731  WBC 11.7* 14.6* 19.4* 21.3* 11.4*  PROCALCITON -- -- -- -- --   Cultures: Blood cultures x2 7/2>>>ng Resp c/s 7/2>>>ng UC 7/2>>>ng CSF 7/2>>>ng  Antibiotics: Rocephin 7/2 (CAP)>> Azithromycin 7/2 (CAP)>>  A:  Community acquired pneumonia coverage indicated. WBC trending down P:   - continue Rocephin and azithromycin     ENDOCRINE  Lab 06/06/12 0650 06/05/12 2214 06/05/12 1611 06/05/12 1121 06/05/12 0738  GLUCAP 134* 102* 109* 144* 113*   A:  Diabetes type 2   P:   Change to SSI protocol  NEUROLOGIC  A:  Altered mental status/ seizure unclear etiology, possible hypoxemia and infection. Much improved this am.  P:  EEG/MRI neg - dc keppra -  -OOB to chair  BEST PRACTICE / DISPOSITION Level of Care:  TELE  Primary Service:  PCCM service  Consultants:  Cardiology, Code Status:  Full  Diet: PO DVT Px:  Heparin, SCD GI Px: PO protonix Skin Integrity:  Good  Social / Family:   OK to transfer to cards/ triad svc based on cath results until abx completed -another 2 days     Katherine V.  [phone]  06/06/2012, 11:05 AM

## 2012-06-06 NOTE — Cardiovascular Report (Signed)
NAMEMARAYA, GWILLIAM NO.:  [id number]  MEDICAL RECORD NO.:  [id number]  LOCATION:  2028                         FACILITY:  [facility]  PHYSICIAN:  Nicki Guadalajara, M.D.     DATE OF BIRTH:  [birth year]  DATE OF PROCEDURE:  06/06/2012 DATE OF DISCHARGE:                           CARDIAC CATHETERIZATION   INDICATIONS:  Ms. [name] is a 78 year old African American female who has a history of hypertension, diabetes mellitus, hyperlipidemia, GERD as well as peripheral vascular disease, and obesity.  She was admitted to [facility] on June 03, 2012, after she presented with wheezing.  She then developed respiratory distress, had seizure activity and required intubation in the emergency room.  She subsequently underwent LP, which was negative.  Initial troponin was negative, but subsequent troponins were positive and she did have inferolateral ST abnormalities.  An echo Doppler study was done on June 04, 2012, which showed an EF of 35-40% with moderate hypokinesis and wall motion abnormality in the inferolateral myocardium.  She had grade 2 diastolic dysfunction, aortic valve sclerosis with mild MR, moderate MR, and mild-to-moderate TR.  Troponin peaked at 1.22.  She had been extubated the day after admission.  She has been treated with pneumonia on antibiotic therapy.  She now presents for cardiac catheterization.  PROCEDURE:  After premedication with Versed at 1 mg plus fentanyl 25 mcg, the patient was prepped and draped in usual fashion.  Her right femoral artery was punctured anteriorly and a 5-French sheath was inserted without difficulty.  Diagnostic cardiac catheterization was done utilizing 5-French Judkins 4 left and right coronary catheters.  A 5-French pigtail catheter was used for biplane cine left ventriculography as well as distal aortography.  Hemostasis was obtained by direct manual pressure.  The patient tolerated the procedure well and returned to  room in satisfactory condition.  HEMODYNAMIC DATA:  Central aortic pressure 140/70, left ventricular pressure 140/9, post A-wave 24.  ANGIOGRAPHIC DATA:  Left main coronary artery was angiographically normal and bifurcated into an LAD, a ramus intermedius vessel, and left circumflex coronary artery.  The LAD was moderate-sized vessel.  The vessel gave rise to proximal first diagonal vessel.  There was mild smooth 20% narrowing in this proximal diagonal vessel.  The remainder of the LAD was free of significant disease and wrapped around the LV apex.  The ramus intermedius vessel was angiographically normal.  The circumflex vessel was angiographically normal and gave rise to an additional marginal vessel.  The right coronary artery was a dominant vessel, which had mild 10-20% proximal narrowing.  There was luminal irregularities just beyond the acute margin after a marginal branch prior to the PDA takeoff with narrowings of 20-30% segmentally.  The RCA supplied the PDA inferior LV branch and ended in a posterolateral vessel.  Biplane cine left ventriculography revealed an ejection fraction of 35- 40%.  There was suggestion of mild inferior hypocontractility and slight posterolateral hypocontractility relative to the other walls, which were hypocontractile diffusely.  Distal aortography demonstrated widely patent renal arteries without significant aortoiliac disease.  IMPRESSION: 1. Nonischemic cardiomyopathy with an ejection fraction of 35-40%,     with moderate diffuse hypokinesis slightly more pronounced in the  inferior-to-inferolateral wall. 2. Nonobstructive mild coronary artery disease with 20% narrowing in     the proximal portion of the first diagonal branch of the left     anterior descending artery, normal ramus intermedius vessel, normal     left circumflex coronary artery, and mild luminal irregularity of     the dominant right coronary artery with 20% proximal  narrowing and     20-30% irregularity beyond the acute margin proximal to the     posterior descending artery takeoff.  RECOMMENDATION:  Medical therapy.          ______________________________ Nicki Guadalajara, M.D.     TK/MEDQ  D:  06/06/2012  T:  06/06/2012  Job:  [id number]  cc:   Nanetta Batty, M.D. Nelda Bucks, MD

## 2012-06-06 NOTE — Progress Notes (Signed)
ANTICOAGULATION CONSULT NOTE - Follow Up Consult  Pharmacy Consult for heparin Indication: chest pain/ACS  Labs:  Basename 06/06/12 0612 06/05/12 0500 06/04/12 1413 06/04/12 0440 06/04/12 0200 06/03/12 2000 06/03/12 1540 06/03/12 1413 06/03/12 1412  HGB 9.8* 9.3* -- -- -- -- -- -- --  HCT 30.1* 28.8* -- 31.4* -- -- -- -- --  PLT 246 222 -- 234 -- -- -- -- --  APTT -- -- -- -- -- -- 35 124* --  LABPROT 13.7 -- -- -- -- -- -- 15.7* --  INR 1.03 -- -- -- -- -- -- 1.22 --  HEPARINUNFRC 0.20* 0.31 0.37 -- -- -- -- -- --  CREATININE -- 0.63 -- 0.74 -- -- -- 0.78 --  CKTOTAL -- -- -- -- 96 111 -- -- 110  CKMB -- -- -- -- 4.2* 6.4* -- -- 6.1*  TROPONINI -- -- -- -- 0.81* 1.15* -- -- 1.22*    Assessment: 78yo female now subtherapeutic on heparin after fairly stable at goal with current rate.  Scheduled for cath later this morning.  Goal of Therapy:  Heparin level 0.3-0.7 units/ml   Plan:  Will increase heparin gtt by 1-2 units/kg/hr to 1100 units/hr and f/u after cath.  Colleen Can PharmD BCPS 06/06/2012,7:23 AM

## 2012-06-06 NOTE — Progress Notes (Signed)
PT Cancellation Note  Treatment cancelled today due to medical issues with patient which prohibited therapy.  Patient in cath lab.    [name],[name] 06/06/2012, 11:45 AM  [facility] [phone] (pager)

## 2012-06-06 NOTE — Progress Notes (Signed)
Subjective: vomited after potassium but dis take 20 meq.  Objective: Vital signs in last 24 hours: Temp:  [98.8 F (37.1 C)-99.7 F (37.6 C)] 98.8 F (37.1 C) (07/05 0618) Pulse Rate:  [85-115] 98  (07/05 0618) Resp:  [19-28] 21  (07/05 0618) BP: (144-147)/(74-90) 147/89 mmHg (07/05 0618) SpO2:  [97 %-100 %] 98 % (07/05 0618) Weight:  [78.1 kg (172 lb 2.9 oz)] 78.1 kg (172 lb 2.9 oz) (07/05 0618) Weight change: 0.6 kg (1 lb 5.2 oz) Last BM Date: 06/05/12 Intake/Output from previous day: -262 07/04 0701 - 07/05 0700 In: 510 [P.O.:340; I.V.:170] Out: 802 [Urine:800; Stool:2] Intake/Output this shift:    PE: General:alert and oriented, no chest pain no SOB Heart:S1S2 RRR Lungs:clear without rales, rhonchi or wheezes Abd:+ BS soft non tender Ext:no edema, one small wound on rt. Ankle dressed. Neuro:A&O X 3, MAE, follows commands   Lab Results:  Basename 06/06/12 0612 06/05/12 0500  WBC 11.7* 14.6*  HGB 9.8* 9.3*  HCT 30.1* 28.8*  PLT 246 222   BMET  Basename 06/06/12 0612 06/05/12 0500  NA 140 140  K 3.5 3.6  CL 105 105  CO2 23 26  GLUCOSE 133* 129*  BUN 6 9  CREATININE 0.62 0.63  CALCIUM 8.6 8.0*   Cardiac Panel (last 3 results)  Basename 06/04/12 0200 06/03/12 2000 06/03/12 1412  CKTOTAL 96 111 110  CKMB 4.2* 6.4* 6.1*  TROPONINI 0.81* 1.15* 1.22*  RELINDX RELATIVE INDEX IS INVALID 5.8* 5.5*    Basename 06/04/12 0200 06/03/12 2000  TROPONINI 0.81* 1.15*    Lab Results  Component Value Date   TRIG 91 06/06/2012   No results found for this basename: HGBA1C     No results found for this basename: TSH    Hepatic Function Panel  Basename 06/06/12 0612  PROT 6.7  ALBUMIN 3.2*  AST 18  ALT 9  ALKPHOS 70  BILITOT 0.5  BILIDIR --  IBILI --   No results found for this basename: CHOL in the last 72 hours No results found for this basename: PROTIME in the last 72 hours    EKG: Orders placed during the hospital encounter of 06/03/12  . EKG  12-LEAD  . EKG 12-LEAD  . ED EKG  . ED EKG  . EKG 12-LEAD  . EKG 12-LEAD    Studies/Results: Mr Laqueta Jean ON Contrast  06/05/2012  *RADIOLOGY REPORT*  Clinical Data: Altered mental status.  Evaluate for CVA.  Diabetic hypertensive patient with hyperlipidemia.  MRI HEAD WITHOUT AND WITH CONTRAST  Technique:  Multiplanar, multiecho pulse sequences of the brain and surrounding structures were obtained according to standard protocol without and with intravenous contrast  Contrast: 15mL MULTIHANCE GADOBENATE DIMEGLUMINE 529 MG/ML IV SOLN  Comparison: 06/03/2012 CT.  No comparison MR.  Findings: Motion degraded exam.  No acute infarct.  No intracranial hemorrhage.  Global atrophy without hydrocephalus.  Small vessel disease type changes.  No intracranial mass or abnormal enhancement.  Bilateral mastoid air cell opacification.  Minimal to mild paranasal sinus mucosal thickening.  Expanded partially empty sella.  Upper cervical spine spondylotic changes with mild spinal stenosis. Transverse ligament hypertrophy.  Poor delineation of the left vertebral artery which may be occluded, congenitally small or ending in a PICA distribution. Remainder of the major intracranial vascular structures are patent.  IMPRESSION: Motion degraded exam.  No acute infarct.  Global atrophy without hydrocephalus.  Small vessel disease type changes.  No intracranial mass or abnormal enhancement.  Bilateral mastoid  air cell opacification.  Minimal to mild paranasal sinus mucosal thickening.  Poor delineation of the left vertebral artery which may be occluded, congenitally small or ending in a PICA distribution.  Original Report Authenticated By: Fuller Canada, M.D.   Dg Chest Port 1 View  06/05/2012  *RADIOLOGY REPORT*  Clinical Data: Airspace disease, pneumonia, extubated  PORTABLE CHEST - 1 VIEW  Comparison: 06/04/2012  Findings: Interval extubation and NG tube removal.  Right subclavian central line tip in the upper SVC.  Heart remains  enlarged with central vascular congestion.  Airspace disease persist in the lingula and left lower lobe obscuring the left cardiac border and hemidiaphragm.  Basilar pneumonia not excluded. No enlarging effusion or pneumothorax.  IMPRESSION: Interval extubation.  Persistent lingula and left lower lobe airspace consolidation versus pneumonia  Original Report Authenticated By: Judie Petit. Ruel Favors, M.D.    Medications: I have reviewed the patient's current medications.    Marland Kitchen antiseptic oral rinse  15 mL Mouth Rinse QID  . aspirin  324 mg Oral Pre-Cath  . azithromycin  500 mg Intravenous Q24H  . cefTRIAXone (ROCEPHIN)  IV  1 g Intravenous Q12H  . chlorhexidine  15 mL Mouth Rinse BID  . gadobenate dimeglumine  15 mL Intravenous Once  . insulin aspart  0-9 Units Subcutaneous TID WC  . metoprolol tartrate  25 mg Oral TID  . nitroGLYCERIN  0.5 inch Topical Q8H  . pantoprazole  40 mg Oral Q1200  . potassium chloride  40 mEq Oral Once  . sodium chloride  3 mL Intravenous Q12H  . DISCONTD: levetiracetam  500 mg Intravenous Q12H   Assessment/Plan: Patient Active Problem List  Diagnosis  . Onychomycosis  . DM type 2 with diabetic peripheral neuropathy  . HTN (hypertension)  . Anxiety  . GERD (gastroesophageal reflux disease)  . CAP (community acquired pneumonia)  . Respiratory failure, acute  . Altered mental status  . Sepsis  . Venous ulcer LE, chronic, followed at wound center, ABIs Jan 2013 WNL  . NSTEMI, Troponin 1.22 06/03/12  . Cardiomyopathy, EF 35-40%  . Diastolic dysfunction, left ventricle, grade 2 by echo 06/04/12  . TR (tricuspid regurgitation), mod  . Mitral regurgitation, mod by echo 06/03/12  . Seizure, convulsive   PLAN:Low grade fever but has been on ABX.  Continues on NTG paste, will adjust after cardiac cath. Will plan for cath today.  Dr. Tresa Endo to see. Replaced K+ with 20 meq.  EEG done but not read.  LOS: 3 days   [name],LAURA R 06/06/2012, 9:15 AM   Patient seen and  examined. Agree with assessment and plan. No recurrent chest pain. Discussed cath procedure. Tolerating increased beta-blockade without wheezing.  Plan cath today.   Lennette Bihari, MD, [facility] 06/06/2012 11:06 AM

## 2012-06-06 NOTE — Progress Notes (Signed)
OT Cancellation Note  Treatment cancelled today due to patient receiving procedure or test (cath).  Will re-attempt as time allows. Thanks!  06/06/2012 Cipriano Mile OTR/L Pager [phone] Office [phone]

## 2012-06-06 NOTE — Progress Notes (Signed)
Pt swallowed one potassium tablet and gagged.  Second tablet broken in two and pt vomitted back up. Thomas Hoff

## 2012-06-06 NOTE — CV Procedure (Signed)
Cradiac catherization  [name], 78 y.o., female  Full note dictated; see diagram in chart   DICTATION # [id number], [id number]  LM: nl LAD: 20% DX narrowing RI: nl LCX: nl RCA: 20% prox, 20 - 30% distal  LV FXN: EF 35 - 40% global hypokinesis with slightly more pronounced inferior to posterolateral hypokinesis.  Distal Aortography:WNL  Impression:NICM Medical therapy.  Lennette Bihari, MD, [facility] 06/06/2012 12:16 PM

## 2012-06-06 NOTE — Progress Notes (Signed)
Pt had a slow bleed after cath. I applied pressure and called cath lab to check and new dressing was applied.  Pt ambulated to bathroom after four hour bedrest and right groin continues to ooze.  Called Nada Boozer and applied pressure for an additional 15 minutes and pt placed on an additional four hour bed rest.  Pt understands to hold groin when she coughs.  She forgets sometimes and she coughs forcefully.  Will continue to monitor.  Family at bedside with call bell within reach. Thomas Hoff

## 2012-06-06 NOTE — Progress Notes (Signed)
Assessed skin tear on her sacrum which were not charted previously. Applied foam dressing. Pt tolerated well. Will continue to follow.

## 2012-06-07 LAB — CBC
MCH: 24.8 pg — ABNORMAL LOW (ref 26.0–34.0)
Platelets: 263 10*3/uL (ref 150–400)
RBC: 3.95 MIL/uL (ref 3.87–5.11)

## 2012-06-07 LAB — GLUCOSE, CAPILLARY: Glucose-Capillary: 131 mg/dL — ABNORMAL HIGH (ref 70–99)

## 2012-06-07 LAB — BASIC METABOLIC PANEL
CO2: 22 mEq/L (ref 19–32)
Calcium: 8.9 mg/dL (ref 8.4–10.5)
GFR calc non Af Amer: 85 mL/min — ABNORMAL LOW (ref >90)
Sodium: 140 mEq/L (ref 135–145)

## 2012-06-07 LAB — CSF CULTURE W GRAM STAIN: Culture: NO GROWTH

## 2012-06-07 LAB — FERRITIN: Ferritin: 227 ng/mL (ref 10–291)

## 2012-06-07 LAB — IRON AND TIBC
Saturation Ratios: 11 % — ABNORMAL LOW (ref 20–55)
TIBC: 195 ug/dL — ABNORMAL LOW (ref 250–470)

## 2012-06-07 LAB — RETICULOCYTES: Retic Ct Pct: 1.7 % (ref 0.4–3.1)

## 2012-06-07 MED ORDER — METOPROLOL TARTRATE 50 MG PO TABS
50.0000 mg | ORAL_TABLET | Freq: Two times a day (BID) | ORAL | Status: DC
  Administered 2012-06-07 (×2): 50 mg via ORAL
  Filled 2012-06-07 (×4): qty 1

## 2012-06-07 MED ORDER — ALPRAZOLAM 0.25 MG PO TABS
0.2500 mg | ORAL_TABLET | Freq: Every evening | ORAL | Status: DC | PRN
  Administered 2012-06-07 – 2012-06-12 (×6): 0.25 mg via ORAL
  Filled 2012-06-07 (×7): qty 1

## 2012-06-07 MED ORDER — HEPARIN SODIUM (PORCINE) 5000 UNIT/ML IJ SOLN
5000.0000 [IU] | Freq: Three times a day (TID) | INTRAMUSCULAR | Status: DC
  Administered 2012-06-07 – 2012-06-13 (×18): 5000 [IU] via SUBCUTANEOUS
  Filled 2012-06-07 (×21): qty 1

## 2012-06-07 MED ORDER — DM-GUAIFENESIN ER 30-600 MG PO TB12
1.0000 | ORAL_TABLET | Freq: Two times a day (BID) | ORAL | Status: DC
  Administered 2012-06-07 – 2012-06-09 (×5): 1 via ORAL
  Filled 2012-06-07 (×6): qty 1

## 2012-06-07 MED ORDER — POTASSIUM CHLORIDE CRYS ER 20 MEQ PO TBCR
20.0000 meq | EXTENDED_RELEASE_TABLET | Freq: Once | ORAL | Status: AC
  Administered 2012-06-07: 20 meq via ORAL
  Filled 2012-06-07: qty 1

## 2012-06-07 NOTE — Progress Notes (Signed)
  Name: [name] MRN: [id number] DOB: [birth year]    LOS: 4  Referring Provider:  EDP Reason for Referral:   Respiratory failure  PULMONARY / CRITICAL CARE MEDICINE  Brief patient description:  78 year old African female with sepsis, LLL pneumonia and respiratory failure and altered mental status  Events Since Admission: 7/2- Intubated in ED on admission  7/2- LP, CT head 7/3- improved neurostatus 7/4 MRI 7/4 no infarct, Poor delineation of the left vertebral artery which may be  occluded, congenitally small or ending in a PICA distribution. EEG 7/4 neg   Current Status: No complaints today.  Cath with nonobstructive cad and decreased ef.  No fever, no increased wob  Tubes/Lines: ETT 7/2 >>> 7/3 R hand PIV 7/2 >>> L Hand PIV 7/2 >>> R Subclavian 7/2 >>> NG 7/2 >>>out  Vital Signs: Temp:  [98.5 F (36.9 C)] 98.5 F (36.9 C) (07/05 2129) Pulse Rate:  [91-104] 104  (07/06 1002) Resp:  [20-28] 22  (07/06 0820) BP: (128-159)/(59-92) 138/70 mmHg (07/06 0820) SpO2:  [94 %-100 %] 97 % (07/06 0540) Weight:  [77.4 kg (170 lb 10.2 oz)] 77.4 kg (170 lb 10.2 oz) (07/06 0347)  Physical Examination: General:  oxygen  Neuro: awake and alert, moves all 4. HEENT:   Nose without purulence or discharge Cardiovascular:? Regular, no murmur noted.Lungs:  clearer Abdomen:  Soft nontender bowel sounds normal  LE without edema, no cyanosis  Principal Problem:  *Respiratory failure, acute Active Problems:  DM type 2 with diabetic peripheral neuropathy  HTN (hypertension)  GERD (gastroesophageal reflux disease)  CAP (community acquired pneumonia)  Altered mental status  Sepsis  Venous ulcer LE, chronic, followed at wound center, ABIs Jan 2013 WNL  NSTEMI, Troponin 1.22 06/03/12  Cardiomyopathy, EF 35-40%  Diastolic dysfunction, left ventricle, grade 2 by echo 06/04/12  TR (tricuspid regurgitation), mod  Mitral regurgitation, mod by echo 06/03/12  Seizure,  convulsive   ASSESSMENT AND PLAN   Left lower lobe can be community acquired pneumonia with acute respiratory failure Pt is afebrile on abx, and no increased wob.  Will keep on iv abx one more day, and change over to po in am.    Nonischemic CM Cath with nonobstructive CAD and low EF.  Management per cards  A:  Mild anemia  Cbc stable Restarted on subq heparin for dvt prophylaxis.  Will monitor cath site.    A:  Diabetes type 2   P:   Change to SSI protocol      [facility] M    06/07/2012, 12:15 PM

## 2012-06-07 NOTE — Progress Notes (Signed)
Subjective: No complaints--Some oozing at cath site last pm, additional pressure held.  Heparin D/C'd   Run of SVT last pm  Objective: Vital signs in last 24 hours: Temp:  [98.5 F (36.9 C)] 98.5 F (36.9 C) (07/05 2129) Pulse Rate:  [91-101] 91  (07/06 0347) Resp:  [20-28] 22  (07/06 0820) BP: (128-159)/(59-93) 138/70 mmHg (07/06 0820) SpO2:  [94 %-100 %] 97 % (07/06 0540) Weight:  [77.4 kg (170 lb 10.2 oz)] 77.4 kg (170 lb 10.2 oz) (07/06 0347) Weight change: -0.7 kg (-1 lb 8.7 oz) Last BM Date: 06/06/12 Intake/Output from previous day: +945 07/05 0701 - 07/06 0700 In: 2746.7 [I.V.:2546.7; IV Piggyback:200] Out: 1801 [Urine:1800; Stool:1] Intake/Output this shift:    PE: General:alert and oriented  Heart:S1S2 RRR Lungs:clear Abd:+BS, soft, non tender Ext:no edema, cath site without hematoma, while sitting in chair    Lab Results:  Basename 06/07/12 0552 06/06/12 0612  WBC 6.7 11.7*  HGB 9.8* 9.8*  HCT 30.6* 30.1*  PLT 263 246   BMET  Basename 06/07/12 0552 06/06/12 0612  NA 140 140  K 3.5 3.5  CL 106 105  CO2 22 23  GLUCOSE 144* 133*  BUN 8 6  CREATININE 0.61 0.62  CALCIUM 8.9 8.6   No results found for this basename: TROPONINI:2,CK,MB:2 in the last 72 hours  Lab Results  Component Value Date   TRIG 91 06/06/2012   No results found for this basename: HGBA1C     No results found for this basename: TSH    Hepatic Function Panel  Basename 06/06/12 0612  PROT 6.7  ALBUMIN 3.2*  AST 18  ALT 9  ALKPHOS 70  BILITOT 0.5  BILIDIR --  IBILI --   No results found for this basename: CHOL in the last 72 hours No results found for this basename: PROTIME in the last 72 hours    EKG: Orders placed during the hospital encounter of 06/03/12  . EKG 12-LEAD  . EKG 12-LEAD  . ED EKG  . ED EKG  . EKG 12-LEAD  . EKG 12-LEAD    Studies/Results: Mr Laqueta Jean YN Contrast  06/05/2012  *RADIOLOGY REPORT*  Clinical Data: Altered mental status.  Evaluate for  CVA.  Diabetic hypertensive patient with hyperlipidemia.  MRI HEAD WITHOUT AND WITH CONTRAST  Technique:  Multiplanar, multiecho pulse sequences of the brain and surrounding structures were obtained according to standard protocol without and with intravenous contrast  Contrast: 15mL MULTIHANCE GADOBENATE DIMEGLUMINE 529 MG/ML IV SOLN  Comparison: 06/03/2012 CT.  No comparison MR.  Findings: Motion degraded exam.  No acute infarct.  No intracranial hemorrhage.  Global atrophy without hydrocephalus.  Small vessel disease type changes.  No intracranial mass or abnormal enhancement.  Bilateral mastoid air cell opacification.  Minimal to mild paranasal sinus mucosal thickening.  Expanded partially empty sella.  Upper cervical spine spondylotic changes with mild spinal stenosis. Transverse ligament hypertrophy.  Poor delineation of the left vertebral artery which may be occluded, congenitally small or ending in a PICA distribution. Remainder of the major intracranial vascular structures are patent.  IMPRESSION: Motion degraded exam.  No acute infarct.  Global atrophy without hydrocephalus.  Small vessel disease type changes.  No intracranial mass or abnormal enhancement.  Bilateral mastoid air cell opacification.  Minimal to mild paranasal sinus mucosal thickening.  Poor delineation of the left vertebral artery which may be occluded, congenitally small or ending in a PICA distribution.  Original Report Authenticated By: Fuller Canada, M.D.  CARDIAC CATH 06/06/12: LM: nl  LAD: 20% DX narrowing  RI: nl  LCX: nl  RCA: 20% prox, 20 - 30% distal  LV FXN: EF 35 - 40% global hypokinesis with slightly more pronounced inferior to posterolateral hypokinesis.     Medications: I have reviewed the patient's current medications.    . ALPRAZolam  0.25 mg Oral Once  . antiseptic oral rinse  15 mL Mouth Rinse QID  . azithromycin  500 mg Oral Daily  . cefTRIAXone (ROCEPHIN)  IV  1 g Intravenous Q12H  . chlorhexidine  15  mL Mouth Rinse BID  . fentaNYL      . heparin      . insulin aspart  0-9 Units Subcutaneous TID WC  . lidocaine      . metoprolol tartrate  25 mg Oral TID  . midazolam      . nitroGLYCERIN      . pantoprazole  40 mg Oral Q1200  . DISCONTD: azithromycin  500 mg Intravenous Q24H  . DISCONTD: nitroGLYCERIN  0.5 inch Topical Q8H  . DISCONTD: sodium chloride  3 mL Intravenous Q12H   Assessment/Plan: Patient Active Problem List  Diagnosis  . Onychomycosis  . DM type 2 with diabetic peripheral neuropathy  . HTN (hypertension)  . Anxiety  . GERD (gastroesophageal reflux disease)  . CAP (community acquired pneumonia)  . Respiratory failure, acute  . Altered mental status  . Sepsis  . Venous ulcer LE, chronic, followed at wound center, ABIs Jan 2013 WNL  . NSTEMI, Troponin 1.22 06/03/12  . Cardiomyopathy, EF 35-40%  . Diastolic dysfunction, left ventricle, grade 2 by echo 06/04/12  . TR (tricuspid regurgitation), mod  . Mitral regurgitation, mod by echo 06/03/12  . Seizure, convulsive  PSVT   PLAN:Dr. Tresa Endo has seen,  Will increase BB, check anemia panel.  LOS: 4 days   [name],LAURA R 06/07/2012, 8:46 AM   Patient seen and examined. Agree with assessment and plan. Patient developed SVT last evening. No chest pain. Will increase beta blocker. Will check iron studies with microcytic anemia.   Lennette Bihari, MD, [facility] 06/07/2012 9:07 AM

## 2012-06-07 NOTE — Progress Notes (Signed)
Physical Therapy Treatment Patient Details Name: [name] MRN: [id number] DOB: May 22, 1931 Today's Date: 06/07/2012 Time: 4098-1191 PT Time Calculation (min): 13 min  PT Assessment / Plan / Recommendation Comments on Treatment Session  Multiple family members present for session.  Mobilty improved from previous session.      Follow Up Recommendations  Home health PT;Supervision/Assistance - 24 hour    Barriers to Discharge        Equipment Recommendations  None recommended by PT    Recommendations for Other Services OT consult  Frequency Min 3X/week   Plan Discharge plan remains appropriate    Precautions / Restrictions Precautions Precautions: Fall        Mobility  Bed Mobility Bed Mobility: Supine to Sit Supine to Sit: 4: Min assist;HOB elevated;With rails Sitting - Scoot to Edge of Bed: 4: Min guard Details for Bed Mobility Assistance: cues for technique and sequence.  Transfers Transfers: Sit to Stand;Stand to Sit Sit to Stand: 4: Min assist;From bed;With upper extremity assist Stand to Sit: 4: Min assist;With upper extremity assist;With armrests;To chair/3-in-1 Details for Transfer Assistance: Patient needed cues for hand placement for sit to stand.  Needed a little assist for forward translation. Ambulation/Gait Ambulation/Gait Assistance: 4: Min assist Ambulation Distance (Feet): 20 Feet Assistive device: Rolling walker Gait Pattern: Step-through pattern;Decreased stride length;Trunk flexed Gait velocity: Decreased General Gait Details: much improved from previous treatment.  VC's and tactile cues for erect posture Stairs: No    Exercises General Exercises - Lower Extremity Long Arc Quad: AROM;Both;15 reps;Seated       PT Goals Acute Rehab PT Goals PT Goal Formulation: With patient Time For Goal Achievement: 06/19/12 Potential to Achieve Goals: Good Pt will go Supine/Side to Sit: with modified independence PT Goal: Supine/Side to Sit - Progress:  Progressing toward goal Pt will go Sit to Stand: with modified independence;with upper extremity assist PT Goal: Sit to Stand - Progress: Progressing toward goal Pt will Ambulate: with modified independence;with least restrictive assistive device;51 - 150 feet PT Goal: Ambulate - Progress: Progressing toward goal Pt will Go Up / Down Stairs: 3-5 stairs;with min assist;with least restrictive assistive device PT Goal: Up/Down Stairs - Progress: Other (comment) (not tested)  Visit Information  Last PT Received On: 06/07/12 Assistance Needed: +1    Subjective Data      Cognition  Overall Cognitive Status: Appears within functional limits for tasks assessed/performed Arousal/Alertness: Awake/alert Orientation Level: Appears intact for tasks assessed Behavior During Session: [facility] for tasks performed    Balance     End of Session PT - End of Session Equipment Utilized During Treatment: Gait belt Activity Tolerance: Patient tolerated treatment well;Other (comment) (Fatigued after gait) Patient left: in chair;with call bell/phone within reach;with family/visitor present Nurse Communication: Mobility status   GP     Donnella Sham 06/07/2012, 3:20 PM Lavona Mound, PT  [phone] 06/07/2012

## 2012-06-07 NOTE — Progress Notes (Signed)
OT Cancellation Note  Treatment cancelled today due to patient's refusal to participate this AM. Will re-attempt later today in the PM.  06/07/2012 Cipriano Mile OTR/L Pager [phone] Office [phone]

## 2012-06-08 LAB — BASIC METABOLIC PANEL
CO2: 21 mEq/L (ref 19–32)
Calcium: 9 mg/dL (ref 8.4–10.5)
Creatinine, Ser: 0.65 mg/dL (ref 0.50–1.10)
Glucose, Bld: 205 mg/dL — ABNORMAL HIGH (ref 70–99)

## 2012-06-08 LAB — CBC
Hemoglobin: 9.6 g/dL — ABNORMAL LOW (ref 12.0–15.0)
MCH: 24.8 pg — ABNORMAL LOW (ref 26.0–34.0)
MCV: 77 fL — ABNORMAL LOW (ref 78.0–100.0)
RBC: 3.87 MIL/uL (ref 3.87–5.11)

## 2012-06-08 LAB — GLUCOSE, CAPILLARY
Glucose-Capillary: 146 mg/dL — ABNORMAL HIGH (ref 70–99)
Glucose-Capillary: 113 mg/dL — ABNORMAL HIGH (ref 70–99)
Glucose-Capillary: 130 mg/dL — ABNORMAL HIGH (ref 70–99)

## 2012-06-08 MED ORDER — LEVOFLOXACIN 750 MG PO TABS
750.0000 mg | ORAL_TABLET | Freq: Every day | ORAL | Status: DC
  Administered 2012-06-08 – 2012-06-13 (×6): 750 mg via ORAL
  Filled 2012-06-08 (×6): qty 1

## 2012-06-08 MED ORDER — LOSARTAN POTASSIUM 25 MG PO TABS
25.0000 mg | ORAL_TABLET | Freq: Two times a day (BID) | ORAL | Status: DC
  Administered 2012-06-08 – 2012-06-12 (×9): 25 mg via ORAL
  Filled 2012-06-08 (×11): qty 1

## 2012-06-08 MED ORDER — METOPROLOL TARTRATE 50 MG PO TABS
62.5000 mg | ORAL_TABLET | Freq: Two times a day (BID) | ORAL | Status: DC
  Administered 2012-06-08 – 2012-06-09 (×3): 62.5 mg via ORAL
  Filled 2012-06-08 (×4): qty 1

## 2012-06-08 NOTE — Progress Notes (Signed)
[facility] Progress Note  Subjective:  Breathing better.  Objective:   Vital Signs in the last 24 hours: Temp:  [97.8 F (36.6 C)-99.2 F (37.3 C)] 97.8 F (36.6 C) (07/07 0425) Pulse Rate:  [94-104] 98  (07/07 0515) Resp:  [18-20] 18  (07/07 0515) BP: (152-167)/(90-94) 167/92 mmHg (07/07 0515) SpO2:  [94 %-97 %] 94 % (07/07 0515) Weight:  [80.151 kg (176 lb 11.2 oz)] 80.151 kg (176 lb 11.2 oz) (07/07 0515)  Intake/Output from previous day: 07/06 0701 - 07/07 0700 In: 650 [P.O.:600; IV Piggyback:50] Out: 151 [Urine:150; Stool:1]  Scheduled:   . antiseptic oral rinse  15 mL Mouth Rinse QID  . azithromycin  500 mg Oral Daily  . cefTRIAXone (ROCEPHIN)  IV  1 g Intravenous Q12H  . chlorhexidine  15 mL Mouth Rinse BID  . dextromethorphan-guaiFENesin  1 tablet Oral BID  . heparin subcutaneous  5,000 Units Subcutaneous Q8H  . insulin aspart  0-9 Units Subcutaneous TID WC  . metoprolol tartrate  50 mg Oral BID  . pantoprazole  40 mg Oral Q1200  . potassium chloride  20 mEq Oral Once    Physical Exam:   General appearance: alert, cooperative and no distress Neck: no JVD Lungs: BS improved; slightly decreased at bases; no wheezing Heart: S1, S2 normal 1/6 sem Abdomen: soft, non-tender; bowel sounds normal; no masses,  no organomegaly Extremities: no edema, remote venous ulcers with scarring   Rate: 90  Rhythm: normal sinus rhythm and  had short burst of recurrent SVT this am  Lab Results:    Basename 06/08/12 0500 06/07/12 0552  NA 139 140  K 4.0 3.5  CL 107 106  CO2 21 22  GLUCOSE 205* 144*  BUN 10 8  CREATININE 0.65 0.61   No results found for this basename: TROPONINI:2,CK,MB:2 in the last 72 hours Hepatic Function Panel  Basename 06/06/12 0612  PROT 6.7  ALBUMIN 3.2*  AST 18  ALT 9  ALKPHOS 70  BILITOT 0.5  BILIDIR --  IBILI --    Basename 06/06/12 0612  INR 1.03   CBC    Component Value Date/Time   WBC 6.8  06/08/2012 0500   RBC 3.87 06/08/2012 0500   HGB 9.6* 06/08/2012 0500   HCT 29.8* 06/08/2012 0500   PLT 268 06/08/2012 0500   MCV 77.0* 06/08/2012 0500   MCH 24.8* 06/08/2012 0500   MCHC 32.2 06/08/2012 0500   RDW 13.8 06/08/2012 0500   LYMPHSABS 0.9 06/03/2012 0731   MONOABS 0.5 06/03/2012 0731   EOSABS 0.0 06/03/2012 0731   BASOSABS 0.0 06/03/2012 0731    Lipid Panel     Component Value Date/Time   TRIG 91 06/06/2012 0612      Imaging:  No results found.    Assessment/Plan:   Principal Problem:  *Respiratory failure, acute Active Problems:  DM type 2 with diabetic peripheral neuropathy  HTN (hypertension)  GERD (gastroesophageal reflux disease)  CAP (community acquired pneumonia)  Altered mental status  Sepsis  Venous ulcer LE, chronic, followed at wound center, ABIs Jan 2013 WNL  NSTEMI, Troponin 1.22 06/03/12  Cardiomyopathy, EF 35-40%  Diastolic dysfunction, left ventricle, grade 2 by echo 06/04/12  TR (tricuspid regurgitation), mod  Mitral regurgitation, mod by echo 06/03/12  Seizure, convulsive  Clinically improving. Had another short burst of SVT at 130 earlier.  Will increase lopressor to 62.5 mg bid. With EF 35 - 40%  and DM  will also add low dose  ARB. On dual antibiotics for pneumonia.   Lennette Bihari, MD, [facility] 06/08/2012, 9:05 AM

## 2012-06-08 NOTE — Progress Notes (Signed)
  Name: [name] MRN: [id number] DOB: 1931-06-12    LOS: 5  Referring Provider:  EDP Reason for Referral:   Respiratory failure  PULMONARY / CRITICAL CARE MEDICINE  Brief patient description:  78 year old African female with sepsis, LLL pneumonia and respiratory failure and altered mental status  Events Since Admission: 7/2- Intubated in ED on admission  7/2- LP, CT head 7/3- improved neurostatus 7/4 MRI 7/4 no infarct, Poor delineation of the left vertebral artery which may be  occluded, congenitally small or ending in a PICA distribution. EEG 7/4 neg   Current Status: No complaints today.  Cath with nonobstructive cad and decreased ef.  No fever, no increased wob.  Having issues with [facility], so will add flutter valve.  Tubes/Lines: ETT 7/2 >>> 7/3 R hand PIV 7/2 >>> L Hand PIV 7/2 >>> R Subclavian 7/2 >>> NG 7/2 >>>out  Vital Signs: Temp:  [97.8 F (36.6 C)-99.2 F (37.3 C)] 97.8 F (36.6 C) (07/07 0425) Pulse Rate:  [94-104] 98  (07/07 0515) Resp:  [18-20] 18  (07/07 0515) BP: (152-167)/(90-94) 167/92 mmHg (07/07 0515) SpO2:  [94 %-97 %] 94 % (07/07 0515) Weight:  [80.151 kg (176 lb 11.2 oz)] 80.151 kg (176 lb 11.2 oz) (07/07 0515)  Physical Examination: General: [place] oxygen  Neuro: awake and alert, moves all 4. HEENT:   Nose without purulence or discharge Cardiovascular:? Regular, no murmur noted. Lungs:  With crackles and decreased bs left base, no wheezing Abdomen:  Soft nontender bowel sounds normal  LE without edema, no cyanosis  Principal Problem:  *Respiratory failure, acute Active Problems:  DM type 2 with diabetic peripheral neuropathy  HTN (hypertension)  GERD (gastroesophageal reflux disease)  CAP (community acquired pneumonia)  Altered mental status  Sepsis  Venous ulcer LE, chronic, followed at wound center, ABIs Jan 2013 WNL  NSTEMI, Troponin 1.22 06/03/12  Cardiomyopathy, EF 35-40%  Diastolic dysfunction, left ventricle, grade 2 by echo  06/04/12  TR (tricuspid regurgitation), mod  Mitral regurgitation, mod by echo 06/03/12  Seizure, convulsive   ASSESSMENT AND PLAN   Left lower lobe can be community acquired pneumonia with acute respiratory failure Pt is afebrile on abx, and no increased wob.  Will change over to po abx, and get her to work on flutter valve.   Nonischemic CM Cath with nonobstructive CAD and low EF.  Management per cards  A:  Mild anemia  Cbc stable Restarted on subq heparin for dvt prophylaxis.  Will monitor cath site.    A:  Diabetes type 2   P:   Blood sugars controlled.       Baljit Liebert M    06/08/2012, 1:14 PM

## 2012-06-08 NOTE — Progress Notes (Signed)
Occupational Therapy Evaluation Patient Details Name: [name] MRN: [id number] DOB: 04-28-1931 Today's Date: 06/08/2012 Time: 0722-0737 OT Time Calculation (min): 15 min  OT Assessment / Plan / Recommendation Clinical Impression  Patient s/p NSTEMI and respiratory failure. Will benefit from acute OT services to address below problem list in prep for d/c home with 24/7 assist from family.    OT Assessment  Patient needs continued OT Services    Follow Up Recommendations  Supervision/Assistance - 24 hour    Barriers to Discharge      Equipment Recommendations  None recommended by OT    Recommendations for Other Services    Frequency  Min 2X/week    Precautions / Restrictions Precautions Precautions: Fall Restrictions Weight Bearing Restrictions: No   Pertinent Vitals/Pain See vitals    ADL  Eating/Feeding: Performed;Independent Where Assessed - Eating/Feeding: Chair Upper Body Bathing: Simulated;Set up Where Assessed - Upper Body Bathing: Supported sitting Lower Body Bathing: Simulated;Set up Where Assessed - Lower Body Bathing: Unsupported sitting Upper Body Dressing: Simulated;Set up Where Assessed - Upper Body Dressing: Unsupported sitting Lower Body Dressing: Performed;Set up Where Assessed - Lower Body Dressing: Supported sitting Toilet Transfer: Simulated;Minimal assistance Toilet Transfer Method: Sit to Barista: Other (comment) (chair) Equipment Used: Gait belt;Rolling walker Transfers/Ambulation Related to ADLs: Min assist with RW ADL Comments: Pt able to don/doff socks sitting edge of chair by leaning forward to reach feet.      OT Diagnosis: Generalized weakness  OT Problem List: Decreased activity tolerance;Decreased strength;Impaired balance (sitting and/or standing) OT Treatment Interventions: Self-care/ADL training;Therapeutic activities;Patient/family education;Balance training   OT Goals Acute Rehab OT Goals OT Goal  Formulation: With patient Time For Goal Achievement: 06/22/12 Potential to Achieve Goals: Good ADL Goals Pt Will Perform Grooming: with supervision;Standing at sink (3 tasks) ADL Goal: Grooming - Progress: Goal set today Pt Will Transfer to Toilet: with supervision;Ambulation;with DME;Comfort height toilet ADL Goal: Toilet Transfer - Progress: Goal set today Miscellaneous OT Goals Miscellaneous OT Goal #1: Pt will perform bed mobility with supervision as precursor for EOB ADLs. OT Goal: Miscellaneous Goal #1 - Progress: Goal set today Miscellaneous OT Goal #2: Pt will perform sit<>stand transfer from chair with supervision as precursor for functional mobility. OT Goal: Miscellaneous Goal #2 - Progress: Goal set today  Visit Information  Last OT Received On: 06/08/12    Subjective Data      Prior Functioning  Home Living Lives With: Son Available Help at Discharge: Family;Available 24 hours/day Type of Home: House Home Access: Stairs to enter [employer] of Steps: 5 Entrance Stairs-Rails: Left Home Layout: One level Bathroom Shower/Tub: Tub/shower unit (sponge bathes) Bathroom Toilet: Standard Home Adaptive Equipment: Bedside commode/3-in-1;Shower chair without back;Hand-held shower hose;Straight cane;Walker - rolling Additional Comments: Ambulated with cane PTA Prior Function Level of Independence: Independent with assistive device(s) Able to Take Stairs?: Yes Driving: No Vocation: Retired Comments: Pt reports that she has been sponge bathing for a long time and would like to continue doing so. Dominant Hand: Right    Cognition  Overall Cognitive Status: Appears within functional limits for tasks assessed/performed Arousal/Alertness: Awake/alert Orientation Level: Appears intact for tasks assessed Behavior During Session: [facility] for tasks performed    Extremity/Trunk Assessment Right Upper Extremity Assessment RUE ROM/Strength/Tone: Within functional  levels RUE Sensation: WFL - Light Touch;WFL - Proprioception RUE Coordination: WFL - gross/fine motor Left Upper Extremity Assessment LUE ROM/Strength/Tone: Within functional levels LUE Sensation: WFL - Light Touch;WFL - Proprioception LUE Coordination: WFL - gross/fine  motor   Mobility Transfers Transfers: Sit to Stand;Stand to Sit Sit to Stand: 4: Min assist;From chair/3-in-1;With armrests;With upper extremity assist Stand to Sit: 4: Min guard;To chair/3-in-1;With armrests;With upper extremity assist Details for Transfer Assistance: VC for safe hand placement.  Assist for anterior weight shift to obtain upright posture.   Exercise    Balance Balance Balance Assessed: Yes Static Standing Balance Static Standing - Balance Support: No upper extremity supported Static Standing - Level of Assistance: 5: Stand by assistance Static Standing - Comment/# of Minutes: 1 minute  End of Session OT - End of Session Equipment Utilized During Treatment: Gait belt Activity Tolerance: Patient tolerated treatment well Patient left: in chair;with call bell/phone within reach Nurse Communication: Mobility status  GO    06/08/2012 Cipriano Mile OTR/L Pager [phone] Office [phone]  Cipriano Mile 06/08/2012, 10:08 AM

## 2012-06-09 DIAGNOSIS — K219 Gastro-esophageal reflux disease without esophagitis: Secondary | ICD-10-CM

## 2012-06-09 LAB — CULTURE, BLOOD (ROUTINE X 2): Culture: NO GROWTH

## 2012-06-09 LAB — BASIC METABOLIC PANEL
GFR calc non Af Amer: 82 mL/min — ABNORMAL LOW (ref >90)
Glucose, Bld: 170 mg/dL — ABNORMAL HIGH (ref 70–99)
Potassium: 4.2 mEq/L (ref 3.5–5.1)
Sodium: 137 mEq/L (ref 135–145)

## 2012-06-09 LAB — CBC
Hemoglobin: 9.1 g/dL — ABNORMAL LOW (ref 12.0–15.0)
MCHC: 31.6 g/dL (ref 30.0–36.0)
RBC: 3.74 MIL/uL — ABNORMAL LOW (ref 3.87–5.11)

## 2012-06-09 LAB — PRO B NATRIURETIC PEPTIDE: Pro B Natriuretic peptide (BNP): 30704 pg/mL — ABNORMAL HIGH (ref 0–450)

## 2012-06-09 LAB — TRIGLYCERIDES: Triglycerides: 90 mg/dL (ref <150)

## 2012-06-09 LAB — GLUCOSE, CAPILLARY

## 2012-06-09 MED ORDER — METOPROLOL TARTRATE 50 MG PO TABS
75.0000 mg | ORAL_TABLET | Freq: Two times a day (BID) | ORAL | Status: DC
  Administered 2012-06-09 – 2012-06-11 (×4): 75 mg via ORAL
  Filled 2012-06-09 (×6): qty 1

## 2012-06-09 MED ORDER — METFORMIN HCL 500 MG PO TABS
1000.0000 mg | ORAL_TABLET | Freq: Two times a day (BID) | ORAL | Status: DC
  Administered 2012-06-09 – 2012-06-13 (×8): 1000 mg via ORAL
  Filled 2012-06-09 (×10): qty 2

## 2012-06-09 NOTE — Progress Notes (Signed)
  Name: [name] MRN: [id number] DOB: 1931-05-22    LOS: 6  Referring Provider:  EDP Reason for Referral:   Respiratory failure  PULMONARY / CRITICAL CARE MEDICINE  Brief patient description:  78 year old African female with sepsis, LLL pneumonia and respiratory failure and altered mental status  Events Since Admission: 7/2- Intubated in ED on admission  7/2- LP, CT head 7/3- improved neurostatus 7/4 MRI 7/4 no infarct, Poor delineation of the left vertebral artery which may be occluded, congenitally small or ending in a PICA distribution. EEG 7/4 neg 7/7 - up with PT  Current Status: Up to chair, hoarse voice.  No distress.  Productive cough.   Tubes/Lines: ETT 7/2 >>> 7/3 R hand PIV 7/2 >>> L Hand PIV 7/2 >>> R Subclavian 7/2 >>> NG 7/2 >>>out  Vital Signs: Temp:  [97.5 F (36.4 C)-98.8 F (37.1 C)] 97.5 F (36.4 C) (07/08 0424) Pulse Rate:  [61-104] 92  (07/08 0424) Resp:  [18-19] 18  (07/08 0424) BP: (141-158)/(87-91) 142/87 mmHg (07/08 0424) SpO2:  [93 %-98 %] 93 % (07/08 0424) Weight:  [180 lb (81.647 kg)] 180 lb (81.647 kg) (07/08 0424)  Physical Examination: General: elderly female in NAD  Neuro: awake and alert, moves all 4. HEENT:   Nose without purulence or discharge Cardiovascular:? Regular, no murmur noted. Lungs:  Even / non-labored, lungs bilaterally coarse, no crackles/wheezes Abdomen:  Soft nontender bowel sounds normal  LE without edema, no cyanosis  Principal Problem:  *Respiratory failure, acute Active Problems:  DM type 2 with diabetic peripheral neuropathy  HTN (hypertension)  GERD (gastroesophageal reflux disease)  CAP (community acquired pneumonia)  Altered mental status  Sepsis  Venous ulcer LE, chronic, followed at wound center, ABIs Jan 2013 WNL  NSTEMI, Troponin 1.22 06/03/12  Cardiomyopathy, EF 35-40%  Diastolic dysfunction, left ventricle, grade 2 by echo 06/04/12  TR (tricuspid regurgitation), mod  Mitral regurgitation,  mod by echo 06/03/12  Seizure, convulsive   ASSESSMENT AND PLAN  Left lower lobe can be community acquired pneumonia with acute respiratory failure Pt is afebrile on abx, and no increased wob.   Plan; -po abx - work on flutter valve -PO abx 7/8 = day 7 abx  Nonischemic CM SVT Cath with nonobstructive CAD and low EF.   Plan: -Management per cards -will need rec's for home meds   A:   Mild anemia  Cbc stable Plan: -continue sq heparin   A:  Diabetes type 2 - Blood sugars controlled.  P:   -SSI  -resume home glucophage 7/8  Dispo Patient lives at home with her son.  She has no stairs to navigate.  Feels she can go back home with PT.  Will plan for potential d/c in am if ok with Cardiology (still titrating her BP/HR meds).  Uses rolling sit assist device for ambulation.  Will need home PT/OT/RN at d/c.    PCP: Dr. Kellie Shropshire [phone])  Canary Brim, NP-C [facility] Pgr: [phone] or [phone]     06/09/2012, 10:34 AM    Billy Fischer, MD ; [facility] service Mobile [phone].  After 5:30 PM or weekends, call [phone]

## 2012-06-09 NOTE — Progress Notes (Signed)
Patient [name]. [employer], 77 year old [place] American female looks forward to going home tomorrow.  Patient enjoys spiritual support of many family members and friends.  Patient thanked Orthoptist for providing pastoral presence, prayer, and conversation.  I will follow-up as needed.

## 2012-06-09 NOTE — Progress Notes (Signed)
The [facility] Progress Note  Subjective:  Feels better. Had another short burst of 11 beats of SVT at 140 at 12:45 pm today  Objective:   Vital Signs in the last 24 hours: Temp:  [97.2 F (36.2 C)-98.3 F (36.8 C)] 97.2 F (36.2 C) (07/08 1346) Pulse Rate:  [90-104] 90  (07/08 1346) Resp:  [18] 18  (07/08 1346) BP: (142-158)/(82-91) 144/82 mmHg (07/08 1346) SpO2:  [93 %-98 %] 98 % (07/08 1346) Weight:  [81.647 kg (180 lb)] 81.647 kg (180 lb) (07/08 0424)  Intake/Output from previous day: 07/07 0701 - 07/08 0700 In: 1920 [P.O.:720; I.V.:1200] Out: 450 [Urine:450]  Scheduled:   . antiseptic oral rinse  15 mL Mouth Rinse QID  . chlorhexidine  15 mL Mouth Rinse BID  . heparin subcutaneous  5,000 Units Subcutaneous Q8H  . insulin aspart  0-9 Units Subcutaneous TID WC  . levofloxacin  750 mg Oral Daily  . losartan  25 mg Oral BID  . metFORMIN  1,000 mg Oral BID WC  . metoprolol tartrate  62.5 mg Oral BID  . pantoprazole  40 mg Oral Q1200  . DISCONTD: dextromethorphan-guaiFENesin  1 tablet Oral BID    Physical Exam:   General appearance: alert, cooperative and no distress Neck: no carotid bruit and no JVD Lungs: clear to auscultation bilaterally Heart: S1, S2 normal Abdomen: soft, non-tender; bowel sounds normal; no masses,  no organomegaly Extremities: no sig edema   Rate: 85  Rhythm: normal sinus rhythm with 11 beats of svt earlier  Lab Results:    Basename 06/09/12 0610 06/08/12 0500  NA 137 139  K 4.2 4.0  CL 104 107  CO2 21 21  GLUCOSE 170* 205*  BUN 14 10  CREATININE 0.66 0.65   No results found for this basename: TROPONINI:2,CK,MB:2 in the last 72 hours Hepatic Function Panel No results found for this basename: PROT,ALBUMIN,AST,ALT,ALKPHOS,BILITOT,BILIDIR,IBILI in the last 72 hours No results found for this basename: INR in the last 72 hours  Lipid Panel     Component Value Date/Time   TRIG 90 06/09/2012 0610      Imaging:  No results found.    Assessment/Plan:   Principal Problem:  *Respiratory failure, acute Active Problems:  DM type 2 with diabetic peripheral neuropathy  HTN (hypertension)  GERD (gastroesophageal reflux disease)  CAP (community acquired pneumonia)  Altered mental status  Sepsis  Venous ulcer LE, chronic, followed at wound center, ABIs Jan 2013 WNL  NSTEMI, Troponin 1.22 06/03/12  Cardiomyopathy, EF 35-40%  Diastolic dysfunction, left ventricle, grade 2 by echo 06/04/12  TR (tricuspid regurgitation), mod  Mitral regurgitation, mod by echo 06/03/12  Seizure, convulsive   Will further titrate lopressor to 75 mg bid ( may need 100 bid dosing). No wheezing on exam. BP improved. Tolerating initiation of ARB, need to further titrate tomorrow.   Lennette Bihari, MD, [facility] 06/09/2012, 5:37 PM

## 2012-06-09 NOTE — Care Management Note (Signed)
    Page 1 of 2   06/13/2012     3:52:35 PM   CARE MANAGEMENT NOTE 06/13/2012  Patient:  [name], [name]   Account Number:  [id number]  Date Initiated:  06/04/2012  Documentation initiated by:  [facility]  Subjective/Objective Assessment:   SOB - pneumonia.  Daughter.     Action/Plan:   Anticipated DC Date:  06/09/2012   Anticipated DC Plan:  HOME W HOME HEALTH SERVICES      DC Planning Services  CM consult      [facility] Choice  HOME HEALTH   Choice offered to / List presented to:  C-1 Patient        HH arranged  HH-1 RN  HH-2 PT  HH-3 OT      [facility] agency  [facility]   Status of service:  Completed, signed off Medicare Important Message given?   (If response is "NO", the following Medicare IM given date fields will be blank) Date Medicare IM given:   Date Additional Medicare IM given:    Discharge Disposition:  HOME W HOME HEALTH SERVICES  Per UR Regulation:  Reviewed for med. necessity/level of care/duration of stay  If discussed at Long Length of Stay Meetings, dates discussed:    Comments:  ContactYolande Jolly Daughter [phone]  06/13/12 Orson Rho,RN,BSN 1100 PT FOR DISCHARGE HOME TODAY.  NOTIFIED [facility] OF DISCHARGE, AND OF ORDER FOR LAB DRAW ON MONDAY 7/15 FOR BMET AND PRO BNP.  06/09/12  1147  CRYSTAL SIMMONS RN, BSN [phone] REFERRAL PLACED TO [facility] WITH [facility]  FOR HH SERVICES PER PT CHOICE; SOC DATE: WITHIN 24-48HRS POST D/C;  NCM FOLLOWING.

## 2012-06-09 NOTE — Progress Notes (Signed)
OT Cancellation Note  Treatment cancelled today due to pt declining therapy as she had just received breakfast.  Pt up in chair already and reports that she looks forward to therapy but would like to do it later today. Will re-attempt as time allows.  06/09/2012 Cipriano Mile OTR/L Pager [phone] Office [phone]

## 2012-06-09 NOTE — Progress Notes (Signed)
Physical Therapy Treatment Patient Details Name: [name] MRN: [id number] DOB: 1931/01/27 Today's Date: 06/09/2012 Time: 0922-0955 PT Time Calculation (min): 33 min  PT Assessment / Plan / Recommendation Comments on Treatment Session  Patient s/p ARF with decr mobility secondary to decr endurance.  Progressed ambulation today.      Follow Up Recommendations  Home health PT;Supervision/Assistance - 24 hour    Barriers to Discharge        Equipment Recommendations  None recommended by PT    Recommendations for Other Services OT consult  Frequency Min 3X/week   Plan Discharge plan remains appropriate;Frequency remains appropriate    Precautions / Restrictions Precautions Precautions: Fall Restrictions Weight Bearing Restrictions: No   Pertinent Vitals/Pain VSS, No pain    Mobility  Bed Mobility Bed Mobility: Not assessed Details for Bed Mobility Assistance: Assisted patient onto 3N1 with min guard assist.  PAtient cleaned herself as well.   Transfers Transfers: Sit to Stand;Stand to Sit Sit to Stand: 4: Min guard;With upper extremity assist;With armrests;From chair/3-in-1 Stand to Sit: 4: Min guard;With upper extremity assist;With armrests;To chair/3-in-1 Details for Transfer Assistance: Patient needed cues for hand placement and foot placement. Verbal Cues and tactile cues Assist for anterior weight shift to obtain posture as well.  Ambulation/Gait Ambulation/Gait Assistance: 4: Min assist Ambulation Distance (Feet): 165 Feet Assistive device: 4-wheeled walker Ambulation/Gait Assistance Details: Patient needed cues for sequencing steps and RW and to stay close to RW.  Does not forward flex as much with 4 wheeled RW.   Gait Pattern: Step-through pattern;Decreased stride length;Trunk flexed Gait velocity: Decreased General Gait Details: Patient needed 1 seated rest break on 4 wheeled RW.  Ambulated 82 feet and then 83 feet.   Stairs: No Research officer, political party: No    Exercises General Exercises - Lower Extremity Long Arc Quad: AROM;Both;10 reps;Seated Heel Slides: AROM;Both;10 reps;Supine   PT Goals Acute Rehab PT Goals PT Goal: Sit to Stand - Progress: Progressing toward goal PT Goal: Ambulate - Progress: Progressing toward goal  Visit Information  Last PT Received On: 06/09/12 Assistance Needed: +1    Subjective Data  Subjective: "I can try to use that 4 wheeled walker."   Cognition  Overall Cognitive Status: Appears within functional limits for tasks assessed/performed Arousal/Alertness: Awake/alert Orientation Level: Appears intact for tasks assessed Behavior During Session: [facility] for tasks performed    Balance  Static Sitting Balance Static Sitting - Level of Assistance: Not tested (comment) Static Standing Balance Static Standing - Balance Support: Bilateral upper extremity supported;During functional activity Static Standing - Level of Assistance: 5: Stand by assistance Static Standing - Comment/# of Minutes: 2 minutes  End of Session PT - End of Session Equipment Utilized During Treatment: Gait belt Activity Tolerance: Patient limited by fatigue Patient left: in chair;with call bell/phone within reach Nurse Communication: Mobility status        [name],[name] 06/09/2012, 1:16 PM [facility] Acute Rehabilitation [phone] (pager)

## 2012-06-10 DIAGNOSIS — I2510 Atherosclerotic heart disease of native coronary artery without angina pectoris: Secondary | ICD-10-CM | POA: Diagnosis present

## 2012-06-10 DIAGNOSIS — I471 Supraventricular tachycardia, unspecified: Secondary | ICD-10-CM | POA: Diagnosis not present

## 2012-06-10 LAB — BASIC METABOLIC PANEL
CO2: 22 mEq/L (ref 19–32)
Calcium: 9.1 mg/dL (ref 8.4–10.5)
Glucose, Bld: 145 mg/dL — ABNORMAL HIGH (ref 70–99)
Sodium: 138 mEq/L (ref 135–145)

## 2012-06-10 LAB — CBC
HCT: 28.7 % — ABNORMAL LOW (ref 36.0–46.0)
Hemoglobin: 9.1 g/dL — ABNORMAL LOW (ref 12.0–15.0)
MCH: 24.5 pg — ABNORMAL LOW (ref 26.0–34.0)
MCV: 77.4 fL — ABNORMAL LOW (ref 78.0–100.0)
RBC: 3.71 MIL/uL — ABNORMAL LOW (ref 3.87–5.11)

## 2012-06-10 LAB — GLUCOSE, CAPILLARY
Glucose-Capillary: 108 mg/dL — ABNORMAL HIGH (ref 70–99)
Glucose-Capillary: 90 mg/dL (ref 70–99)
Glucose-Capillary: 93 mg/dL (ref 70–99)

## 2012-06-10 MED ORDER — FUROSEMIDE 10 MG/ML IJ SOLN
40.0000 mg | Freq: Once | INTRAMUSCULAR | Status: AC
  Administered 2012-06-10: 40 mg via INTRAVENOUS
  Filled 2012-06-10: qty 4

## 2012-06-10 MED ORDER — HYDROCHLOROTHIAZIDE 12.5 MG PO CAPS
12.5000 mg | ORAL_CAPSULE | Freq: Every day | ORAL | Status: DC
  Administered 2012-06-11 – 2012-06-12 (×2): 12.5 mg via ORAL
  Filled 2012-06-10 (×3): qty 1

## 2012-06-10 NOTE — Progress Notes (Signed)
Occupational Therapy Treatment Patient Details Name: SHERRIAN NUNNELLEY MRN: [id number] DOB: [birth year] Today's Date: 06/10/2012 Time: 4098-1191 OT Time Calculation (min): 50 min  OT Assessment / Plan / Recommendation Comments on Treatment Session This 78 yo making progress and motivated to get better.    Follow Up Recommendations  Supervision/Assistance - 24 hour    Barriers to Discharge       Equipment Recommendations  None recommended by OT    Recommendations for Other Services    Frequency Min 2X/week   Plan Discharge plan remains appropriate    Precautions / Restrictions Precautions Precautions: Fall Restrictions Weight Bearing Restrictions: No   Pertinent Vitals/Pain     ADL  Upper Body Bathing: Performed;Chest;Right arm;Left arm;Abdomen;Set up;Supervision/safety Where Assessed - Upper Body Bathing: Supported sitting (at sink sitting on 4 wheeled walker) Lower Body Bathing: Performed;Minimal assistance (for feet) Where Assessed - Lower Body Bathing: Supported sit to stand Lower Body Dressing: Performed;+1 Total assistance (for socks) Where Assessed - Lower Body Dressing: Supported sitting Toilet Transfer: Performed;Minimal assistance Toilet Transfer Method: Sit to Barista: Regular height toilet;Grab bars Toileting - Clothing Manipulation and Hygiene: Performed;Independent Where Assessed - Toileting Clothing Manipulation and Hygiene: Standing Equipment Used: Rolling walker Transfers/Ambulation Related to ADLs: Min A    OT Diagnosis:    OT Problem List:   OT Treatment Interventions:     OT Goals ADL Goals ADL Goal: Statistician - Progress: Progressing toward goals Miscellaneous OT Goals OT Goal: Miscellaneous Goal #1 - Progress: Met OT Goal: Miscellaneous Goal #2 - Progress: Met  Visit Information  Last OT Received On: 06/10/12 Assistance Needed: +1    Subjective Data      Prior Functioning       Cognition  Overall  Cognitive Status: Appears within functional limits for tasks assessed/performed Arousal/Alertness: Awake/alert Behavior During Session: [facility] for tasks performed    Mobility Bed Mobility Bed Mobility: Sit to Supine Sit to Supine: 5: Supervision;HOB flat Transfers Transfers: Sit to Stand;Stand to Sit Sit to Stand: 4: Min guard;With armrests;From chair/3-in-1 Stand to Sit: 4: Min guard;With upper extremity assist;To bed   Exercises    Balance    End of Session OT - End of Session Equipment Utilized During Treatment: Gait belt Activity Tolerance: Patient tolerated treatment well Patient left: in bed;with call bell/phone within reach;with family/visitor present (sister and daughter) Nurse Communication:  (when I was finished so she could do dressing changes)       Evette Georges [phone]  06/10/2012, 2:28 PM

## 2012-06-10 NOTE — Progress Notes (Signed)
Physical Therapy Treatment Patient Details Name: [name] MRN: [id number] DOB: 12/12/1930 Today's Date: 06/10/2012 Time: 4098-1191 PT Time Calculation (min): 32 min  PT Assessment / Plan / Recommendation Comments on Treatment Session  Pt admitted with ARF, VDRF and progressing well with ambulation today. Pt able to ambulate from room to elevator before seated rest at which time Dr.Berry arrived and requested pt return to room for examination therefore unable to complete steps today but will attempt next session. Dgtr Mylinda Latina) present for end of session and aware of education for pt to  increase ambulation to bathroom and perform bil LE HEP throughout day. Will continue to follow.     Follow Up Recommendations       Barriers to Discharge        Equipment Recommendations       Recommendations for Other Services    Frequency     Plan Discharge plan remains appropriate;Frequency remains appropriate    Precautions / Restrictions Precautions Precautions: Fall   Pertinent Vitals/Pain No pain    Mobility  Bed Mobility Bed Mobility: Supine to Sit;Sitting - Scoot to Edge of Bed Supine to Sit: 6: Modified independent (Device/Increase time);With rails;HOB flat Sitting - Scoot to Edge of Bed: 6: Modified independent (Device/Increase time) Transfers Transfers: Sit to Stand;Stand to [employer] Transfers Sit to Stand: 4: Min guard;From bed;From chair/3-in-1 Stand to Sit: 4: Min guard;To chair/3-in-1 Stand Pivot Transfers: 4: Min guard Details for Transfer Assistance: Pt stood from bed pivoted to [facility] with cueing for safety then ambulated before having to rest on rollator and final return to chair. Cueing for safety and control of descent as pt not fully controlling with sitting at rollator Ambulation/Gait Ambulation/Gait Assistance: 4: Min guard Ambulation Distance (Feet): 120 Feet (120' x 2 with seated rest between) Assistive device: 4-wheeled  walker Ambulation/Gait Assistance Details: Pt with cueing to step closer to RW and extend trunk and hips with gait.  Gait Pattern: Step-through pattern;Trunk flexed Gait velocity: decreased Stairs: No    Exercises General Exercises - Lower Extremity Long Arc Quad: AROM;Both;15 reps;Seated Hip Flexion/Marching: AROM;Both;15 reps;Seated   PT Diagnosis:    PT Problem List:   PT Treatment Interventions:     PT Goals Acute Rehab PT Goals Pt will go Supine/Side to Sit: with modified independence;Other (comment) (without rails) PT Goal: Supine/Side to Sit - Progress: Progressing toward goal PT Goal: Sit to Stand - Progress: Progressing toward goal PT Goal: Ambulate - Progress: Progressing toward goal PT Goal: Up/Down Stairs - Progress: Progressing toward goal  Visit Information  Last PT Received On: 06/10/12 Assistance Needed: +1    Subjective Data  Subjective: I'm getting better   Cognition  Overall Cognitive Status: Appears within functional limits for tasks assessed/performed Arousal/Alertness: Awake/alert Orientation Level: Appears intact for tasks assessed Behavior During Session: [facility] for tasks performed    Balance     End of Session PT - End of Session Equipment Utilized During Treatment: Gait belt Activity Tolerance: Patient tolerated treatment well Patient left: in chair;with call bell/phone within reach;with family/visitor present   GP     Toney Sang Beth 06/10/2012, 9:32 AM Delaney Meigs, PT [phone]

## 2012-06-10 NOTE — Progress Notes (Addendum)
  Name: [name] MRN: [id number] DOB: [birth year]    LOS: 7  Referring Provider:  EDP Reason for Referral:   Respiratory failure  PULMONARY / CRITICAL CARE MEDICINE  Brief patient description:  78 year old African female with sepsis, LLL pneumonia and respiratory failure and altered mental status  Events Since Admission: 7/2- Intubated in ED on admission  7/2- LP, CT head 7/3- improved neurostatus 7/4 MRI 7/4 no infarct, Poor delineation of the left vertebral artery which may be occluded, congenitally small or ending in a PICA distribution. EEG 7/4 neg 7/7 - up with PT  Current Status: Up in chair, incomplete PT visit  Tubes/Lines: ETT 7/2 >>> 7/3 R hand PIV 7/2 >>> L Hand PIV 7/2 >>> R Subclavian 7/2 >>> NG 7/2 >>>out  Vital Signs: Temp:  [97.1 F (36.2 C)-97.6 F (36.4 C)] 97.6 F (36.4 C) (07/09 0514) Pulse Rate:  [90-110] 110  (07/09 0925) Resp:  [18-20] 18  (07/09 0514) BP: (144-151)/(82-99) 151/99 mmHg (07/09 0514) SpO2:  [93 %-98 %] 93 % (07/09 0514) Weight:  [82.5 kg (181 lb 14.1 oz)] 82.5 kg (181 lb 14.1 oz) (07/09 0514)  Physical Examination: Gen: no acute distress HEENT: NCATEOM[place], OP clear,  PULM: insp crackles L lung CV: RRR, no mgr, no JVD AB: BS+, soft, nontender, no hsm Ext: warm, no edema, no clubbing, no cyanosis Neuro: A&Ox4, maew    Principal Problem:  *Respiratory failure, acute 06/03/12 Active Problems:  DM type 2 with diabetic peripheral neuropathy  HTN (hypertension)  GERD (gastroesophageal reflux disease)  CAP (community acquired pneumonia), 06/03/12  Sepsis, ?- blood cult and resp cult negative but Lt pnuemonia  Venous ulcer LE, chronic, followed at wound center, ABIs Jan 2013 WNL  NSTEMI, Troponin 1.22 06/03/12  Cardiomyopathy, EF 35-40%  Diastolic dysfunction, left ventricle, grade 2 by echo 06/04/12  Mitral regurgitation, mod by echo 06/03/12  Seizure, convulsive  PSVT (paroxysmal supraventricular tachycardia)  CAD (coronary  artery disease), mild, 20% cath 06/06/12   ASSESSMENT AND PLAN  Left lower lobe can be community acquired pneumonia with acute respiratory failure Pt is afebrile on abx, and no increased wob.   Plan; -po abx - work on flutter valve -would treat with Levaquin for 14 days given severity of illness and she is still making sputum  Nonischemic CM SVT Cath with nonobstructive CAD and low EF.   Plan: -B-blocker titration and diuretic today per cardiology  A:   Mild anemia  Cbc stable Plan: -continue sq heparin   A:  Diabetes type 2 - Blood sugars controlled.  P:   -SSI  -resume home glucophage 7/8  Dispo Patient lives at home with her son.  She has no stairs to navigate.  Feels she can go back home with PT.  Will plan for potential d/c 7/10 if ok with Cardiology (still titrating her BP/HR meds).  Uses rolling sit assist device for ambulation.  Will need home PT/OT/RN at d/c.    7/9: will ask PT to re-evaluate  PCP: Dr. Kellie Shropshire [phone])  Max Fickle [place] [facility] Pager: [phone] Cell: [phone] If no response, call [phone]   06/10/2012, 9:42 AM

## 2012-06-10 NOTE — Progress Notes (Signed)
Subjective:  Up with PT. Still weak  Objective:  Vital Signs in the last 24 hours: Temp:  [97.1 F (36.2 C)-97.6 F (36.4 C)] 97.6 F (36.4 C) (07/09 0514) Pulse Rate:  [90-96] 92  (07/09 0514) Resp:  [18-20] 18  (07/09 0514) BP: (144-151)/(82-99) 151/99 mmHg (07/09 0514) SpO2:  [93 %-98 %] 93 % (07/09 0514) Weight:  [82.5 kg (181 lb 14.1 oz)] 82.5 kg (181 lb 14.1 oz) (07/09 0514)  Intake/Output from previous day:  Intake/Output Summary (Last 24 hours) at 06/10/12 0915 Last data filed at 06/10/12 0836  Gross per 24 hour  Intake    720 ml  Output      0 ml  Net    720 ml    Physical Exam: General appearance: alert, cooperative, no distress and chronically ill appearing Lungs: decreased breath sounds, Lt > Rt Heart: regular rate and rhythm   Rate: 92  Rhythm: normal sinus rhythm and PSVT runs  Lab Results:  Basename 06/10/12 0505 06/09/12 0610  WBC 7.9 7.6  HGB 9.1* 9.1*  PLT 382 311    Basename 06/10/12 0505 06/09/12 0610  NA 138 137  K 4.3 4.2  CL 105 104  CO2 22 21  GLUCOSE 145* 170*  BUN 16 14  CREATININE 0.65 0.66   No results found for this basename: TROPONINI:2,CK,MB:2 in the last 72 hours Hepatic Function Panel No results found for this basename: PROT,ALBUMIN,AST,ALT,ALKPHOS,BILITOT,BILIDIR,IBILI in the last 72 hours No results found for this basename: CHOL in the last 72 hours No results found for this basename: INR in the last 72 hours  Imaging: Imaging results have been reviewed  Cardiac Studies:  Assessment/Plan:   Principal Problem:  *Respiratory failure, acute 06/03/12 Active Problems:  Sepsis, ?- blood cult and resp cult negative but Lt pnuemonia  NSTEMI, Troponin 1.22 06/03/12  CAP (community acquired pneumonia), 06/03/12  Cardiomyopathy, EF 35-40%  Diastolic dysfunction, left ventricle, grade 2 by echo 06/04/12  Mitral regurgitation, mod by echo 06/03/12  PSVT (paroxysmal supraventricular tachycardia)  DM type 2 with diabetic peripheral  neuropathy  HTN (hypertension)  GERD (gastroesophageal reflux disease)  Venous ulcer LE, chronic, followed at wound center, ABIs Jan 2013 WNL  Seizure, convulsive  CAD (coronary artery disease), mild, 20% cath 06/06/12  Plan- Beta blocker titrated yesterday. ABs per CCM/Pulm. BNP 30k- ? Add diuretic.    Corine Shelter PA-C 06/10/2012, 9:15 AM    Agree with note written by Corine Shelter [facility]  Admitted with CAP +/- sepsis. Briefly intubated. Cathed revealed minimal CAD. Mod LV dysfunction which is a new finding (echo in our office 01/31/08 had nl LV FXN). Still on ATBX. BB being titrated secondary to PSVT. Exam notable for decreased BS at left base. BNP increased. Start low dose diuretic.  [name],[name] 06/10/2012 9:22 AM

## 2012-06-11 LAB — BASIC METABOLIC PANEL
BUN: 16 mg/dL (ref 6–23)
CO2: 23 mEq/L (ref 19–32)
Calcium: 9.1 mg/dL (ref 8.4–10.5)
Chloride: 103 mEq/L (ref 96–112)
Creatinine, Ser: 0.74 mg/dL (ref 0.50–1.10)
GFR calc Af Amer: >90 mL/min (ref >90)
GFR calc non Af Amer: 79 mL/min — ABNORMAL LOW (ref >90)
Glucose, Bld: 112 mg/dL — ABNORMAL HIGH (ref 70–99)
Potassium: 4.2 mEq/L (ref 3.5–5.1)
Sodium: 137 mEq/L (ref 135–145)

## 2012-06-11 LAB — CBC
MCH: 24.6 pg — ABNORMAL LOW (ref 26.0–34.0)
MCV: 77.6 fL — ABNORMAL LOW (ref 78.0–100.0)
Platelets: 416 10*3/uL — ABNORMAL HIGH (ref 150–400)
RDW: 14.7 % (ref 11.5–15.5)
WBC: 7.4 10*3/uL (ref 4.0–10.5)

## 2012-06-11 LAB — GLUCOSE, CAPILLARY

## 2012-06-11 MED ORDER — METOPROLOL TARTRATE 25 MG PO TABS
25.0000 mg | ORAL_TABLET | Freq: Once | ORAL | Status: AC
  Administered 2012-06-11: 25 mg via ORAL
  Filled 2012-06-11: qty 1

## 2012-06-11 MED ORDER — LEVOFLOXACIN 750 MG PO TABS: 750.0000 mg | ORAL_TABLET | Freq: Every day | ORAL | Status: AC

## 2012-06-11 MED ORDER — METOPROLOL TARTRATE 100 MG PO TABS
100.0000 mg | ORAL_TABLET | Freq: Two times a day (BID) | ORAL | Status: DC
  Administered 2012-06-11 – 2012-06-13 (×4): 100 mg via ORAL
  Filled 2012-06-11 (×5): qty 1

## 2012-06-11 MED ORDER — BENZONATATE 100 MG PO CAPS: 100.0000 mg | ORAL_CAPSULE | Freq: Two times a day (BID) | ORAL | Status: AC | PRN

## 2012-06-11 MED ORDER — FUROSEMIDE 10 MG/ML IJ SOLN
40.0000 mg | Freq: Two times a day (BID) | INTRAMUSCULAR | Status: AC
  Administered 2012-06-11 – 2012-06-13 (×4): 40 mg via INTRAVENOUS
  Filled 2012-06-11 (×4): qty 4

## 2012-06-11 NOTE — Progress Notes (Signed)
Physical Therapy Treatment Patient Details Name: [name] MRN: [id number] DOB: 1931/07/19 Today's Date: 06/11/2012 Time: 1410-1436 PT Time Calculation (min): 26 min  PT Assessment / Plan / Recommendation Comments on Treatment Session  Final education on stairs completed with pt/pt's daughter.  Pt is ready for D/C.    Follow Up Recommendations  Home health PT;Supervision/Assistance - 24 hour    Barriers to Discharge        Equipment Recommendations  None recommended by PT    Recommendations for Other Services    Frequency Min 3X/week   Plan Discharge plan remains appropriate;Frequency remains appropriate    Precautions / Restrictions Precautions Precautions: Fall   Pertinent Vitals/Pain      Mobility  Bed Mobility Bed Mobility: Not assessed Transfers Transfers: Sit to Stand;Stand to Sit;Stand Pivot Transfers Sit to Stand: 4: Min guard;With armrests;From chair/3-in-1;From bed Stand to Sit: 4: Min guard;With upper extremity assist;To bed;To chair/3-in-1 Stand Pivot Transfers: 4: Min guard Details for Transfer Assistance: reinforced safety/brakes/hand placement Ambulation/Gait Ambulation/Gait Assistance: 4: Min guard Ambulation Distance (Feet): 120 Feet (then 30 and 150 with rests between) Assistive device: 4-wheeled walker Ambulation/Gait Assistance Details: reinforcing safety issues Gait Pattern: Within Functional Limits;Decreased stride length Stairs: Yes Stairs Assistance: 4: Min assist Stairs Assistance Details (indicate cue type and reason): vis/vc's for sequencing Stair Management Technique: One rail Right;Step to pattern;Forwards;Other (comment) (MIN HHA) Number of Stairs: 6     Exercises     PT Diagnosis:    PT Problem List:   PT Treatment Interventions:     PT Goals Acute Rehab PT Goals Potential to Achieve Goals: Good PT Goal: Sit to Stand - Progress: Progressing toward goal PT Goal: Ambulate - Progress: Progressing toward goal PT Goal:  Up/Down Stairs - Progress: Met  Visit Information  Last PT Received On: 06/11/12 Assistance Needed: +1    Subjective Data      Cognition  Overall Cognitive Status: Appears within functional limits for tasks assessed/performed Arousal/Alertness: Awake/alert Orientation Level: Appears intact for tasks assessed Behavior During Session: [facility] for tasks performed    Balance     End of Session PT - End of Session Equipment Utilized During Treatment: Gait belt Activity Tolerance: Patient tolerated treatment well Patient left: Other (comment);with call bell/phone within reach;with family/visitor present (EOB) Nurse Communication: Mobility status   GP     [name], [name] 06/11/2012, 2:49 PM 06/11/2012  [name], PT [phone] (pager)

## 2012-06-11 NOTE — Progress Notes (Addendum)
Pt. Seen and examined. Agree with the NP/PA-C note as written.  Brief, asymptomatic episodes of PSVT. Agree with uptitration of b-blocker to 100 mg BID. EF 35-40%. LVEDP on cath was 24. Diuresis recommended, but not started. BNP 30K. Add IV lasix today. Strict I's/O's. Anticipate 2 more hospital days for diuresis.  Chrystie Nose, MD, [facility] Attending Cardiologist [facility]

## 2012-06-11 NOTE — Progress Notes (Signed)
Name: [name] MRN: [id number] DOB: 1931-06-27    LOS: 8  Referring Provider:  EDP Reason for Referral:   Respiratory failure  PULMONARY / CRITICAL CARE MEDICINE  Brief patient description:  78 year old African female with sepsis, LLL pneumonia and respiratory failure and altered mental status  Events Since Admission: 7/2- Intubated in ED on admission  7/2- LP, CT head 7/3- improved neurostatus 7/4 MRI 7/4 no infarct, Poor delineation of the left vertebral artery which may be occluded, congenitally small or ending in a PICA distribution. EEG 7/4 neg 7/7 - up with PT  Current Status: Wants to go home.  Denies SOB.  Still with mild productive cough.   Tubes/Lines: ETT 7/2 >>> 7/3 R hand PIV 7/2 >>> L Hand PIV 7/2 >>> R Subclavian 7/2 >>> NG 7/2 >>>out  Vital Signs: Temp:  [97 F (36.1 C)-98.3 F (36.8 C)] 97 F (36.1 C) (07/10 1357) Pulse Rate:  [80-88] 80  (07/10 1357) Resp:  [18-20] 18  (07/10 1357) BP: (120-144)/(77-86) 120/77 mmHg (07/10 1357) SpO2:  [96 %-97 %] 97 % (07/10 1357) Weight:  [167 lb 15.9 oz (76.2 kg)] 167 lb 15.9 oz (76.2 kg) (07/10 0300)  Physical Examination: Gen: no acute distress HEENT: NCATEOMi, OP clear,  PULM: insp crackles L lung CV: RRR, no mgr, no JVD AB: BS+, soft, nontender, no hsm Ext: warm, no edema, no clubbing, no cyanosis Neuro: A&Ox4, maew    Principal Problem:  *Respiratory failure, acute 06/03/12 Active Problems:  DM type 2 with diabetic peripheral neuropathy  HTN (hypertension)  GERD (gastroesophageal reflux disease)  CAP (community acquired pneumonia), 06/03/12  Sepsis, ?- blood cult and resp cult negative but Lt pnuemonia  Venous ulcer LE, chronic, followed at wound center, ABIs Jan 2013 WNL  NSTEMI, Troponin 1.22 06/03/12  Cardiomyopathy, EF 35-40%  Diastolic dysfunction, left ventricle, grade 2 by echo 06/04/12  Mitral regurgitation, mod by echo 06/03/12  Seizure, convulsive  PSVT (paroxysmal supraventricular  tachycardia)  CAD (coronary artery disease), mild, 20% cath 06/06/12   ASSESSMENT AND PLAN  Left lower lobe can be community acquired pneumonia with acute respiratory failure Pt is afebrile on abx, and no increased wob.   Plan; pulm hygiene Cont Levaquin for 14 days total abx (stop date 7/15 -- Rx sent to pharmacy!)   Nonischemic CM SVT Cath with nonobstructive CAD and low EF.   Plan: -cont diuresis and B blocker titration per cards.  BNP remains elevated, will keep inpt another 24-48 hours per cards.   A:   Mild anemia  Cbc stable Plan: -continue sq heparin   A:  Diabetes type 2 - Blood sugars controlled.  P:   -SSI  -resume home glucophage 7/8  Dispo Patient lives at home with her son.  She has no stairs to navigate. Ok to go home with home health PT per PT eval.  Clear for d/c from pulm standpoint, but likely still needs cont diuresis and titration of new/changed cardiac meds.  Discussed with Dr. Rennis Golden who will taker her on his service until d/c.   PCCM signing off please call if needed.  F/u appts with Dr. Renae Gloss (PCP) and Dr. Allyson Sabal made.   PCP: Dr. Kellie Shropshire [phone])   Danford Bad, NP 06/11/2012  3:26 PM Pager: [phone]  *Care during the described time interval was provided by me and/or other providers on the critical care team. I have reviewed this patient's available data, including medical history, events of note, physical examination and test results as  part of my evaluation.

## 2012-06-11 NOTE — Progress Notes (Signed)
Patient had a run of SVT at 2030 and her HR went up to 142 and came back down to 84. Will continue to monitor.

## 2012-06-11 NOTE — Progress Notes (Signed)
Subjective: No complaints, not aware of SVT episodes.  Short burst of SVT during the night. Coughing up dark tan mucus.  Objective: Vital signs in last 24 hours: Temp:  [97 F (36.1 C)-98.3 F (36.8 C)] 98.3 F (36.8 C) (07/10 0300) Pulse Rate:  [84-110] 84  (07/10 0300) Resp:  [18-20] 18  (07/10 0300) BP: (131-144)/(83-89) 144/83 mmHg (07/10 0300) SpO2:  [95 %-97 %] 97 % (07/10 0300) Weight:  [76.2 kg (167 lb 15.9 oz)] 76.2 kg (167 lb 15.9 oz) (07/10 0300) Weight change: -6.3 kg (-13 lb 14.2 oz) Last BM Date: 06/10/12 Intake/Output from previous day: +720 07/09 0701 - 07/10 0700 In: 720 [P.O.:720] Out: -  Intake/Output this shift:    PE: General:alert and oriented, pleasant affect Skin:warm and dry, brisk capillary refill Heart:S1S2 RRR Lungs:occ wheeze no rales Abd:+ BS soft non tender Ext:no edema     Lab Results:  Basename 06/11/12 0530 06/10/12 0505  WBC 7.4 7.9  HGB 9.1* 9.1*  HCT 28.7* 28.7*  PLT 416* 382   BMET  Basename 06/11/12 0530 06/10/12 0505  NA 137 138  K 4.2 4.3  CL 103 105  CO2 23 22  GLUCOSE 112* 145*  BUN 16 16  CREATININE 0.74 0.65  CALCIUM 9.1 9.1   No results found for this basename: TROPONINI:2,CK,MB:2 in the last 72 hours  Lab Results  Component Value Date   TRIG 90 06/09/2012   No results found for this basename: HGBA1C     No results found for this basename: TSH      EKG: Orders placed during the hospital encounter of 06/03/12  . EKG 12-LEAD  . EKG 12-LEAD    Studies/Results: No results found.  Medications: I have reviewed the patient's current medications.    . furosemide  40 mg Intravenous Once  . heparin subcutaneous  5,000 Units Subcutaneous Q8H  . hydrochlorothiazide  12.5 mg Oral Daily  . insulin aspart  0-9 Units Subcutaneous TID WC  . levofloxacin  750 mg Oral Daily  . losartan  25 mg Oral BID  . metFORMIN  1,000 mg Oral BID WC  . metoprolol tartrate  75 mg Oral BID  . pantoprazole  40 mg Oral Q1200    . DISCONTD: antiseptic oral rinse  15 mL Mouth Rinse QID  . DISCONTD: chlorhexidine  15 mL Mouth Rinse BID   Assessment/Plan: Patient Active Problem List  Diagnosis  . Onychomycosis  . DM type 2 with diabetic peripheral neuropathy  . HTN (hypertension)  . Anxiety  . GERD (gastroesophageal reflux disease)  . CAP (community acquired pneumonia), 06/03/12  . Respiratory failure, acute 06/03/12  . Sepsis, ?- blood cult and resp cult negative but Lt pnuemonia  . Venous ulcer LE, chronic, followed at wound center, ABIs Jan 2013 WNL  . NSTEMI, Troponin 1.22 06/03/12  . Cardiomyopathy, EF 35-40%  . Diastolic dysfunction, left ventricle, grade 2 by echo 06/04/12  . TR (tricuspid regurgitation), mod  . Mitral regurgitation, mod by echo 06/03/12  . Seizure, convulsive  . PSVT (paroxysmal supraventricular tachycardia)  . CAD (coronary artery disease), mild, 20% cath 06/06/12   PLAN: continued episodes of PSVT, mildly irregular on lopressor at 75 BID ? Increase to 100 BID?   Resolving PNA    LOS: 8 days   [name] 06/11/2012, 8:05 AM

## 2012-06-12 ENCOUNTER — Inpatient Hospital Stay (HOSPITAL_COMMUNITY): Payer: Medicare Other

## 2012-06-12 LAB — CBC
HCT: 27.9 % — ABNORMAL LOW (ref 36.0–46.0)
Hemoglobin: 8.9 g/dL — ABNORMAL LOW (ref 12.0–15.0)
MCHC: 31.9 g/dL (ref 30.0–36.0)
Platelets: 437 10*3/uL — ABNORMAL HIGH (ref 150–400)
RBC: 3.61 MIL/uL — ABNORMAL LOW (ref 3.87–5.11)
WBC: 7.2 10*3/uL (ref 4.0–10.5)

## 2012-06-12 LAB — GLUCOSE, CAPILLARY
Glucose-Capillary: 91 mg/dL (ref 70–99)
Glucose-Capillary: 98 mg/dL (ref 70–99)
Glucose-Capillary: 80 mg/dL (ref 70–99)

## 2012-06-12 LAB — COMPREHENSIVE METABOLIC PANEL
ALT: 9 U/L (ref 0–35)
AST: 18 U/L (ref 0–37)
CO2: 24 mEq/L (ref 19–32)
Calcium: 9 mg/dL (ref 8.4–10.5)
Creatinine, Ser: 0.86 mg/dL (ref 0.50–1.10)
GFR calc Af Amer: 73 mL/min — ABNORMAL LOW (ref >90)
GFR calc non Af Amer: 63 mL/min — ABNORMAL LOW (ref >90)
Sodium: 139 mEq/L (ref 135–145)
Total Protein: 6.2 g/dL (ref 6.0–8.3)

## 2012-06-12 MED ORDER — LOSARTAN POTASSIUM 50 MG PO TABS
50.0000 mg | ORAL_TABLET | Freq: Every day | ORAL | Status: DC
  Administered 2012-06-12 – 2012-06-13 (×2): 50 mg via ORAL
  Filled 2012-06-12: qty 1

## 2012-06-12 MED ORDER — LOSARTAN POTASSIUM 25 MG PO TABS
25.0000 mg | ORAL_TABLET | Freq: Every day | ORAL | Status: DC
  Filled 2012-06-12 (×2): qty 1

## 2012-06-12 MED ORDER — LOSARTAN POTASSIUM 25 MG PO TABS
25.0000 mg | ORAL_TABLET | Freq: Once | ORAL | Status: AC
  Filled 2012-06-12: qty 1

## 2012-06-12 NOTE — Plan of Care (Signed)
Problem: Phase II Progression Outcomes Goal: Other Phase II Outcomes/Goals Outcome: Completed/Met Date Met:  06/12/12 PT/OT

## 2012-06-12 NOTE — Progress Notes (Signed)
Subjective: Feels well; no sob  Objective: Vital signs in last 24 hours: Temp:  [97 F (36.1 C)-98.6 F (37 C)] 98 F (36.7 C) (07/11 0512) Pulse Rate:  [80-84] 84  (07/11 0512) Resp:  [18-20] 18  (07/11 0512) BP: (118-135)/(77-88) 135/88 mmHg (07/11 0512) SpO2:  [95 %-97 %] 95 % (07/11 0512) Weight:  [74.707 kg (164 lb 11.2 oz)] 74.707 kg (164 lb 11.2 oz) (07/11 0512) Weight change: -1.493 kg (-3 lb 4.7 oz) Last BM Date: 06/11/12 Intake/Output from previous day: -1180  Wt. 74.7 down from 82.5 at pk.  07/10 0701 - 07/11 0700 In: 1320 [P.O.:1320] Out: 2500 [Urine:2500] Intake/Output this shift:    PE: General:NAD Neck:no JVD Heart:RRR 1/6 sem Lungs:no rales HYQ:MVHQ Ext:no edema Neuro:nonfocal   Lab Results:  Basename 06/12/12 0520 06/11/12 0530  WBC 7.2 7.4  HGB 8.9* 9.1*  HCT 27.9* 28.7*  PLT 437* 416*   BMET  Basename 06/12/12 0520 06/11/12 0530  NA 139 137  K 3.9 4.2  CL 102 103  CO2 24 23  GLUCOSE 92 112*  BUN 16 16  CREATININE 0.86 0.74  CALCIUM 9.0 9.1   No results found for this basename: TROPONINI:2,CK,MB:2 in the last 72 hours  Lab Results  Component Value Date   TRIG 90 06/09/2012   No results found for this basename: HGBA1C     No results found for this basename: TSH    Hepatic Function Panel  Basename 06/12/12 0520  PROT 6.2  ALBUMIN 3.1*  AST 18  ALT 9  ALKPHOS 47  BILITOT 0.3  BILIDIR --  IBILI --   No results found for this basename: CHOL in the last 72 hours No results found for this basename: PROTIME in the last 72 hours    EKG: Orders placed during the hospital encounter of 06/03/12  . EKG 12-LEAD  . EKG 12-LEAD    Studies/Results: Dg Chest 2 View  06/12/2012  *RADIOLOGY REPORT*  Clinical Data: CHF, cough.  CHEST - 2 VIEW  Comparison: 06/05/2012.  Findings: Trachea is midline.  Heart size stable.  Thoracic aorta is calcified.  Right subclavian central line tip projects over the SVC.  Improving left mid and lower  lung zone airspace disease. Mild right basilar airspace disease.  Small bilateral pleural effusions, left greater than right.  Degenerative changes are seen in the spine.  IMPRESSION:  1.  Improving left mid and lower lung zone airspace disease. 2.  Persistent mild right basilar airspace disease. 3.  Small bilateral pleural effusions, left greater than right.  Original Report Authenticated By: Reyes Ivan, M.D.    Medications: I have reviewed the patient's current medications.    . furosemide  40 mg Intravenous BID  . heparin subcutaneous  5,000 Units Subcutaneous Q8H  . hydrochlorothiazide  12.5 mg Oral Daily  . levofloxacin  750 mg Oral Daily  . losartan  25 mg Oral BID  . metFORMIN  1,000 mg Oral BID WC  . metoprolol tartrate  100 mg Oral BID  . metoprolol tartrate  25 mg Oral Once  . pantoprazole  40 mg Oral Q1200  . DISCONTD: insulin aspart  0-9 Units Subcutaneous TID WC  . DISCONTD: metoprolol tartrate  75 mg Oral BID   Assessment/Plan: Patient Active Problem List  Diagnosis  . Onychomycosis  . DM type 2 with diabetic peripheral neuropathy  . HTN (hypertension)  . Anxiety  . GERD (gastroesophageal reflux disease)  . CAP (community acquired pneumonia), 06/03/12  .  Respiratory failure, acute 06/03/12  . Sepsis, ?- blood cult and resp cult negative but Lt pnuemonia  . Venous ulcer LE, chronic, followed at wound center, ABIs Jan 2013 WNL  . NSTEMI, Troponin 1.22 06/03/12  . Cardiomyopathy, EF 35-40%  . Diastolic dysfunction, left ventricle, grade 2 by echo 06/04/12  . TR (tricuspid regurgitation), mod  . Mitral regurgitation, mod by echo 06/03/12  . Seizure, convulsive  . PSVT (paroxysmal supraventricular tachycardia)  . CAD (coronary artery disease), mild, 20% cath 06/06/12   PLAN:  CXR: 1. Improving left mid and lower lung zone airspace disease.  2. Persistent mild right basilar airspace disease.  3. Small bilateral pleural effusions, left greater than right.   Pro BNP in  better now 19,941 down from 30,704 on the 8th  Mild anemia,    Pt now on our service due to CHF--see Dr. Blanchie Dessert note  She has rec'd Lasix 40 mg IV 06/10/12, and 40 mg Lasix on 06/11/12.  To receive 40 IV BID today.  ? Another day of diuresis  Cr. Is stable.  Dr. Tresa Endo to see and examine.  LOS: 9 days   [name],LAURA R 06/12/2012, 9:32 AM     Patient seen and examined. Agree with assessment and plan. Good diuresis. Wt now at baseline 74 kilos. Tolerating increased beta blocker. Prob ok to dc later today or tomorrow. Increase losartan to 75 mg daily .   Lennette Bihari, MD, [facility] 06/12/2012 10:13 AM

## 2012-06-12 NOTE — Progress Notes (Signed)
Occupational Therapy Treatment Patient Details Name: [name] MRN: [id number] DOB: 02/05/1931 Today's Date: 06/12/2012 Time: 9629-5284 OT Time Calculation (min): 22 min  OT Assessment / Plan / Recommendation Comments on Treatment Session Pt has no concerns about returning home.  Has good support of family and friends.    Follow Up Recommendations  Supervision/Assistance - 24 hour    Barriers to Discharge       Equipment Recommendations  None recommended by OT    Recommendations for Other Services    Frequency Min 2X/week   Plan Discharge plan remains appropriate    Precautions / Restrictions Precautions Precautions: Fall   Pertinent Vitals/Pain No pain    ADL  Grooming: Performed;Wash/dry hands;Supervision/safety Where Assessed - Grooming: Supported Copywriter, advertising: Research scientist (life sciences) Method: Sit to Barista: Materials engineer and Hygiene: Performed;Supervision/safety Where Assessed - Engineer, mining and Hygiene: Standing Equipment Used: Rolling walker Transfers/Ambulation Related to ADLs: min guard assist with rollator ADL Comments: Pt had already completed bathing and dressing, reports having assist only for back.  Able to reach periarea and feet.  Supervision for sit to stand activities for safety.    OT Diagnosis:    OT Problem List:   OT Treatment Interventions:     OT Goals ADL Goals Pt Will Perform Grooming: with supervision;Standing at sink ADL Goal: Grooming - Progress: Met Pt Will Transfer to Toilet: with supervision;Ambulation;with DME;Comfort height toilet ADL Goal: Toilet Transfer - Progress: Progressing toward goals  Visit Information  Last OT Received On: 06/12/12 Assistance Needed: +1    Subjective Data      Prior Functioning       Cognition  Overall Cognitive Status: Appears within functional limits for tasks  assessed/performed Arousal/Alertness: Awake/alert Orientation Level: Appears intact for tasks assessed Behavior During Session: [facility] for tasks performed    Mobility Bed Mobility Bed Mobility: Not assessed Transfers Transfers: Sit to Stand;Stand to Sit Sit to Stand: 5: Supervision;With upper extremity assist;From chair/3-in-1 Stand to Sit: 5: Supervision;With upper extremity assist;To chair/3-in-1 Details for Transfer Assistance: Pt consistently using  brakes on rollator.   Exercises    Balance    End of Session OT - End of Session Activity Tolerance: Patient tolerated treatment well Patient left: in chair;with call bell/phone within reach  GO     Evern Bio 06/12/2012, 10:27 AM [phone]

## 2012-06-12 NOTE — Progress Notes (Signed)
MCQUAID, DOUGLAS [place] PCCM Pager: [phone] Cell: [phone] If no response, call [phone]  

## 2012-06-13 ENCOUNTER — Encounter (HOSPITAL_COMMUNITY): Payer: Self-pay | Admitting: Cardiology

## 2012-06-13 DIAGNOSIS — I5043 Acute on chronic combined systolic (congestive) and diastolic (congestive) heart failure: Secondary | ICD-10-CM | POA: Diagnosis not present

## 2012-06-13 HISTORY — DX: Acute on chronic combined systolic (congestive) and diastolic (congestive) heart failure: I50.43

## 2012-06-13 LAB — BASIC METABOLIC PANEL
CO2: 28 mEq/L (ref 19–32)
Chloride: 98 mEq/L (ref 96–112)
GFR calc Af Amer: 66 mL/min — ABNORMAL LOW (ref >90)
Potassium: 3.6 mEq/L (ref 3.5–5.1)
Sodium: 138 mEq/L (ref 135–145)

## 2012-06-13 LAB — CBC
HCT: 28.9 % — ABNORMAL LOW (ref 36.0–46.0)
MCH: 24.7 pg — ABNORMAL LOW (ref 26.0–34.0)
MCV: 77.7 fL — ABNORMAL LOW (ref 78.0–100.0)
RBC: 3.72 MIL/uL — ABNORMAL LOW (ref 3.87–5.11)
WBC: 6.7 10*3/uL (ref 4.0–10.5)

## 2012-06-13 LAB — GLUCOSE, CAPILLARY
Glucose-Capillary: 84 mg/dL (ref 70–99)
Glucose-Capillary: 106 mg/dL — ABNORMAL HIGH (ref 70–99)

## 2012-06-13 LAB — PRO B NATRIURETIC PEPTIDE: Pro B Natriuretic peptide (BNP): 12155 pg/mL — ABNORMAL HIGH (ref 0–450)

## 2012-06-13 MED ORDER — METOPROLOL TARTRATE 100 MG PO TABS: 100.0000 mg | ORAL_TABLET | Freq: Two times a day (BID) | ORAL | Status: DC

## 2012-06-13 MED ORDER — LOSARTAN POTASSIUM 25 MG PO TABS: 25.0000 mg | ORAL_TABLET | Freq: Every day | ORAL | Status: DC

## 2012-06-13 MED ORDER — POTASSIUM CHLORIDE CRYS ER 20 MEQ PO TBCR
20.0000 meq | EXTENDED_RELEASE_TABLET | Freq: Every day | ORAL | Status: DC
Start: 2012-06-13 — End: 2013-01-30

## 2012-06-13 MED ORDER — FUROSEMIDE 40 MG PO TABS
40.0000 mg | ORAL_TABLET | Freq: Every day | ORAL | Status: DC
  Filled 2012-06-13: qty 1

## 2012-06-13 MED ORDER — POTASSIUM CHLORIDE CRYS ER 20 MEQ PO TBCR
20.0000 meq | EXTENDED_RELEASE_TABLET | Freq: Every day | ORAL | Status: DC
  Administered 2012-06-13: 20 meq via ORAL
  Filled 2012-06-13: qty 1

## 2012-06-13 MED ORDER — POTASSIUM CHLORIDE CRYS ER 20 MEQ PO TBCR: 20.0000 meq | EXTENDED_RELEASE_TABLET | Freq: Once | ORAL | Status: DC

## 2012-06-13 MED ORDER — FUROSEMIDE 40 MG PO TABS: 40.0000 mg | ORAL_TABLET | Freq: Every day | ORAL | Status: DC

## 2012-06-13 MED ORDER — LOSARTAN POTASSIUM 50 MG PO TABS: 50.0000 mg | ORAL_TABLET | Freq: Every day | ORAL | Status: DC

## 2012-06-13 NOTE — Progress Notes (Signed)
Pt. Seen and examined. Agree with the NP/PA-C note as written.  Diuresing nicely - clearly volume overloaded by cath findings with newly reduced LVEF. Weight is now close to baseline. Would switch to po lasix 40 mg daily. Await BMET today. BNP 30K -> 19K. Okay for d/c today if BMET is stable. Follow-up with Dr. Allyson Sabal.  Chrystie Nose, MD, [facility] Attending Cardiologist [facility]

## 2012-06-13 NOTE — Discharge Summary (Signed)
Addendum to Discharge Summary  [name] was kept in the hospital 2 additional days secondary to heart failure and was treated with IV diuretics. Her pro BNP was elevated with a diuretics Her BMP has come down from 30,000-12,155.    Weight at discharge 76.6 kg  We also titrated her ARB and her beta blocker up to control her PSVT and her blood pressure.  At discharge sodium 138 potassium 3.6 chloride 98 CO2 28 BUN 14 creatinine 0.4 calcium 9.1 glucose 94  Two-view chest x-ray 06/13/2011 Improving left mid and lower lung zone airspace disease.  2. Persistent mild right basilar airspace disease.  3. Small bilateral pleural effusions, left greater than right.   She is ambulating without complications.  Physician Discharge Summary  Patient ID: [name] MRN: [id number] DOB/AGE: February 01, 1931 78 y.o.  Admit date: 06/03/2012 Discharge date: 06/13/2012  Discharge Diagnoses:  Principal Problem:  *Respiratory failure, acute 06/03/12 Active Problems:  Cardiomyopathy, EF 35-40%  Diastolic dysfunction, left ventricle, grade 2 by echo 06/04/12  Acute on chronic systolic and diastolic heart failure, NYHA class 2  Mitral regurgitation, mod by echo 06/03/12  DM type 2 with diabetic peripheral neuropathy  HTN (hypertension)  GERD (gastroesophageal reflux disease)  CAP (community acquired pneumonia), 06/03/12  Sepsis, ?- blood cult and resp cult negative but Lt pnuemonia  Venous ulcer LE, chronic, followed at wound center, ABIs Jan 2013 WNL  NSTEMI, Troponin 1.22 06/03/12  Seizure, convulsive  PSVT (paroxysmal supraventricular tachycardia)  CAD (coronary artery disease), mild, 20% cath 06/06/12   Discharged Condition: good  Hospital Course:  Brief Summary: [name] is a 78 y.o. y/o female with a PMH of DM, HTN, PVD presented 7/2 with SOB and fever. While being evaluated in ER she became suddenly unresponsive requiring emergent intubation. CT head unremarkable, CXR with PNA and ?ST changes on  EKG. She was admitted by PCCM with acute resp failure and sepsis, and seen in consultation by cardiology. She also had a questionable seizure in ER and was seen by Neuro. She was intubated for brief period and extubated successfully 7/3.  Hospital Summary by Discharge Diagnosis  Acute Resp failure -- in setting CAP. She required intubation r/t AMS and was extubated after 24 hours.  CAP -- LLL. All cultures remain negative. She will complete course of Levaquin for total 14 days abx.  Sepsis/ SIRS-- in setting CAP. Blood and resp cultures neg. Did not require pressors or EGDT. Lactate 3.4 on admit.  DM-- type 2, was rx with SSI while inpt and will resume metformin on d/c. F/u with PCP.  Nonischmeic cardiomyopathy/ SVT/ diastolic dysfunction -- followed by cardiology Allyson Sabal). Cath 7/5 showed nonobstructive CAD with low EF (35-40%), grade 2 diastolic dysfunction as well as moderate mitral and tricuspid regurg. She has been started on diuretics per cards and B blockers are being titrated. She will f/u with Dr. Allyson Sabal as outpt.  Seizure - Pt had question seizure activity in ER. She was seen in consultation by neuro. Started on Keppra, EEG performed and was neg, LP was performed to r/o acute meningitis which was neg. CT and MRI brain were neg. She is now off keppra with no further seizure activity and no further recommendations from neuro.  Events Since Admission:  7/2- Intubated in ED on admission  7/2- LP, CT head  7/3- improved neurostatus  EEG 7/4 neg  7/7 - up with PT  Tubes/Lines:  ETT 7/2 >>> 7/3  R Subclavian 7/2 >>> 7/10  NG  7/2 >>>out  Microbiology/Sepsis markers:  BCx2 7/2>>> neg  CSF 7/2>>> neg  Sputum 7/2>>> neg  Urine 7/2>>> neg  Significant Diagnostic Studies:  MR brain 7/4>>> Motion degraded exam. No acute infarct. Global atrophy without hydrocephalus. Small vessel disease type changes. No intracranial mass or abnormal enhancement. Bilateral mastoid air cell opacification. Minimal to  mild paranasal sinus mucosal thickening. Poor delineation of the left vertebral artery which may be occluded, congenitally small or ending in a PICA distribution.   Filed Vitals:    06/10/12 0925  06/10/12 1420  06/10/12 2130  06/11/12 0300   BP:   131/89  132/86  144/83   Pulse:  110  85  88  84   Temp:   97 F (36.1 C)  97.7 F (36.5 C)  98.3 F (36.8 C)   TempSrc:   Oral  Oral  Oral   Resp:   18  20  18    Height:       Weight:     167 lb 15.9 oz (76.2 kg)   SpO2:   95%  96%  97%   On RA    Discharge Labs  BMET  Lab  06/11/12 0530  06/10/12 0505  06/09/12 0610  06/08/12 0500  06/07/12 0552   NA  137  138  137  139  140   K  4.2  4.3  --  --  --   CL  103  105  104  107  106   CO2  23  22  21  21  22    GLUCOSE  112*  145*  170*  205*  144*   BUN  16  16  14  10  8    CREATININE  0.74  0.65  0.66  0.65  0.61   CALCIUM  9.1  9.1  9.0  9.0  8.9   MG  --  --  --  --  --   PHOS  --  --  --  --  --   CBC  Lab  06/11/12 0530  06/10/12 0505  06/09/12 0610   HGB  9.1*  9.1*  9.1*   HCT  28.7*  28.7*  28.8*   WBC  7.4  7.9  7.6   PLT  416*  382  311   Anti-Coagulation  Lab  06/06/12 0612   INR  1.03        Discharge Exam: Blood pressure 125/77, pulse 78, temperature 98.3 F (36.8 C), temperature source Oral, resp. rate 20, height 5\' 4"  (1.626 m), weight 76.6 kg (168 lb 14 oz), SpO2 95.00%.   General:NAD , feels good  Neck:no JVD  Heart:RRR 1/6 sem  Lungs:no rales  ZOX:WRUE  Ext:no edema  Neuro:nonfocal  Disposition: 01-Home or Self Care   Medication List  As of 06/13/2012 11:00 AM   STOP taking these medications         cloNIDine 0.3 MG tablet      lisinopril 20 MG tablet      metoprolol succinate 100 MG 24 hr tablet      nisoldipine 34 MG 24 hr tablet         TAKE these medications         acidophilus Caps   Take 1 capsule by mouth daily.      ALPRAZolam 0.25 MG tablet   Commonly known as: XANAX   Take 0.25 mg by mouth daily as needed. For anxiety  aspirin 81 MG chewable tablet   Chew 81 mg by mouth daily.      benzonatate 100 MG capsule   Commonly known as: TESSALON   Take 1 capsule (100 mg total) by mouth 2 (two) times daily as needed for cough.      calcium-vitamin D 500-200 MG-UNIT per tablet   Commonly known as: OSCAL WITH D   Take 1 tablet by mouth daily.      CENTRUM SILVER PO   Take 1 tablet by mouth daily.      furosemide 40 MG tablet   Commonly known as: LASIX   Take 1 tablet (40 mg total) by mouth daily.      gabapentin 300 MG capsule   Commonly known as: NEURONTIN   Take 300 mg by mouth 2 (two) times daily.      levofloxacin 750 MG tablet   Commonly known as: LEVAQUIN   Take 1 tablet (750 mg total) by mouth daily.      losartan 25 MG tablet   Commonly known as: COZAAR   Take 1 tablet (25 mg total) by mouth at bedtime.      losartan 50 MG tablet   Commonly known as: COZAAR   Take 1 tablet (50 mg total) by mouth daily.      metFORMIN 1000 MG tablet   Commonly known as: GLUCOPHAGE   Take 1,000 mg by mouth 2 (two) times daily with a meal.      metoprolol 100 MG tablet   Commonly known as: LOPRESSOR   Take 1 tablet (100 mg total) by mouth 2 (two) times daily.      omeprazole 40 MG capsule   Commonly known as: PRILOSEC   Take 40 mg by mouth 2 (two) times daily.      potassium chloride SA 20 MEQ tablet   Commonly known as: K-DUR,KLOR-CON   Take 1 tablet (20 mEq total) by mouth daily.      traMADol 50 MG tablet   Commonly known as: ULTRAM   Take 50 mg by mouth every 6 (six) hours as needed. Maximum dose= 8 tablets per day. For pain      triamcinolone 0.025 % ointment   Commonly known as: KENALOG   Apply 1 application topically 2 (two) times daily. To legs      vitamin C 500 MG tablet   Commonly known as: ASCORBIC ACID   Take 500 mg by mouth daily.      Zinc 50 MG Tabs   Take 1 tablet by mouth daily.           Follow-up Information    Follow up with [name]., MD on  06/30/2012. (9:00am )    Contact information:   [address] [place] [address] [phone]       Follow up with Runell Gess, MD on 06/25/2012. (11:15am )    Contact information:   [address] [place] [address] [phone]        Discharge Instructions:  Call if any problems.  Heart Healthy diabetic diet  Weigh daily if weight climbs greater than 3 pounds in a day or 5 pounds in a week call the office.  Have lab work done Monday to check potassium.  SignedNada Boozer R 06/13/2012, 11:00 AM

## 2012-06-13 NOTE — Discharge Summary (Signed)
Kenneth C. Hilty, MD, FACC Attending Cardiologist [facility]  

## 2012-06-13 NOTE — Progress Notes (Signed)
Subjective: No complaints, no SOB  Objective: Vital signs in last 24 hours: Temp:  [97.9 F (36.6 C)-98.7 F (37.1 C)] 98.3 F (36.8 C) (07/12 0501) Pulse Rate:  [74-83] 78  (07/12 0501) Resp:  [18-20] 20  (07/12 0501) BP: (103-125)/(64-77) 125/77 mmHg (07/12 0501) SpO2:  [95 %-96 %] 95 % (07/12 0501) Weight:  [76.6 kg (168 lb 14 oz)] 76.6 kg (168 lb 14 oz) (07/12 0501) Weight change: 1.893 kg (4 lb 2.8 oz) Last BM Date: 06/11/12 Intake/Output from previous day:  -1580  wt 76.6 down from 82.5 at pk  Total for stay -2218 07/11 0701 - 07/12 0700 In: 720 [P.O.:720] Out: 2300 [Urine:2300] Intake/Output this shift: Total I/O In: 240 [P.O.:240] Out: -   PE:  General:NAD , feels good Neck:no JVD  Heart:RRR 1/6 sem  Lungs:no rales  ZHY:QMVH  Ext:no edema  Neuro:nonfocal  Lab Results:  Basename 06/13/12 0520 06/12/12 0520  WBC 6.7 7.2  HGB 9.2* 8.9*  HCT 28.9* 27.9*  PLT 461* 437*   BMET  Basename 06/12/12 0520 06/11/12 0530  NA 139 137  K 3.9 4.2  CL 102 103  CO2 24 23  GLUCOSE 92 112*  BUN 16 16  CREATININE 0.86 0.74  CALCIUM 9.0 9.1   No results found for this basename: TROPONINI:2,CK,MB:2 in the last 72 hours  Lab Results  Component Value Date   TRIG 90 06/09/2012   No results found for this basename: HGBA1C     No results found for this basename: TSH    Hepatic Function Panel  Basename 06/12/12 0520  PROT 6.2  ALBUMIN 3.1*  AST 18  ALT 9  ALKPHOS 47  BILITOT 0.3  BILIDIR --  IBILI --   No results found for this basename: CHOL in the last 72 hours No results found for this basename: PROTIME in the last 72 hours    EKG: Orders placed during the hospital encounter of 06/03/12  . EKG 12-LEAD  . EKG 12-LEAD    Studies/Results: Dg Chest 2 View  06/12/2012  *RADIOLOGY REPORT*  Clinical Data: CHF, cough.  CHEST - 2 VIEW  Comparison: 06/05/2012.  Findings: Trachea is midline.  Heart size stable.  Thoracic aorta is calcified.  Right subclavian  central line tip projects over the SVC.  Improving left mid and lower lung zone airspace disease. Mild right basilar airspace disease.  Small bilateral pleural effusions, left greater than right.  Degenerative changes are seen in the spine.  IMPRESSION:  1.  Improving left mid and lower lung zone airspace disease. 2.  Persistent mild right basilar airspace disease. 3.  Small bilateral pleural effusions, left greater than right.  Original Report Authenticated By: Reyes Ivan, M.D.    Medications: I have reviewed the patient's current medications.    . furosemide  40 mg Intravenous BID  . heparin subcutaneous  5,000 Units Subcutaneous Q8H  . hydrochlorothiazide  12.5 mg Oral Daily  . levofloxacin  750 mg Oral Daily  . losartan  25 mg Oral Once  . losartan  25 mg Oral QHS  . losartan  50 mg Oral Daily  . metFORMIN  1,000 mg Oral BID WC  . metoprolol tartrate  100 mg Oral BID  . pantoprazole  40 mg Oral Q1200  . DISCONTD: losartan  25 mg Oral BID   Assessment/Plan: Patient Active Problem List  Diagnosis  . Onychomycosis  . DM type 2 with diabetic peripheral neuropathy  . HTN (hypertension)  . Anxiety  .  GERD (gastroesophageal reflux disease)  . CAP (community acquired pneumonia), 06/03/12  . Respiratory failure, acute 06/03/12  . Sepsis, ?- blood cult and resp cult negative but Lt pnuemonia  . Venous ulcer LE, chronic, followed at wound center, ABIs Jan 2013 WNL  . NSTEMI, Troponin 1.22 06/03/12  . Cardiomyopathy, EF 35-40%  . Diastolic dysfunction, left ventricle, grade 2 by echo 06/04/12  . TR (tricuspid regurgitation), mod  . Mitral regurgitation, mod by echo 06/03/12  . Seizure, convulsive  . PSVT (paroxysmal supraventricular tachycardia)  . CAD (coronary artery disease), mild, 20% cath 06/06/12   PLAN: ? Discharge today after rechecking BMP --She has follow up with Dr. Allyson Sabal 06/25/12.  IV Lasix yesterday, now only on HCTZ ?  Continue or change to lasix.  LOS: 10 days    [name],LAURA R 06/13/2012, 8:55 AM

## 2012-07-08 ENCOUNTER — Other Ambulatory Visit: Payer: Self-pay | Admitting: Internal Medicine

## 2012-07-08 ENCOUNTER — Ambulatory Visit
Admission: RE | Admit: 2012-07-08 | Discharge: 2012-07-08 | Disposition: A | Discharge status: Home / Self Care | Payer: Medicare Other | Source: Ambulatory Visit | Attending: Internal Medicine | Admitting: Internal Medicine

## 2012-07-08 DIAGNOSIS — Z09 Encounter for follow-up examination after completed treatment for conditions other than malignant neoplasm: Secondary | ICD-10-CM

## 2012-08-06 ENCOUNTER — Other Ambulatory Visit (HOSPITAL_COMMUNITY): Payer: Self-pay | Admitting: Internal Medicine

## 2012-08-06 DIAGNOSIS — Z1231 Encounter for screening mammogram for malignant neoplasm of breast: Secondary | ICD-10-CM

## 2012-09-05 ENCOUNTER — Ambulatory Visit (HOSPITAL_COMMUNITY)
Admission: RE | Admit: 2012-09-05 | Discharge: 2012-09-05 | Disposition: A | Discharge status: Home / Self Care | Payer: Medicare Other | Source: Ambulatory Visit | Attending: Internal Medicine | Admitting: Internal Medicine

## 2012-09-05 DIAGNOSIS — Z1231 Encounter for screening mammogram for malignant neoplasm of breast: Secondary | ICD-10-CM

## 2012-11-19 IMAGING — CR DG CHEST 1V PORT
1 series · 1 of 1 positions shown · non-contrast
Comparison: Portable chest x-ray earlier same date 9739 hours.

CLINICAL DATA: Intubation.  Pneumonia.

PORTABLE CHEST - 1 VIEW [DATE]/6100 0117 hours:

[AP]
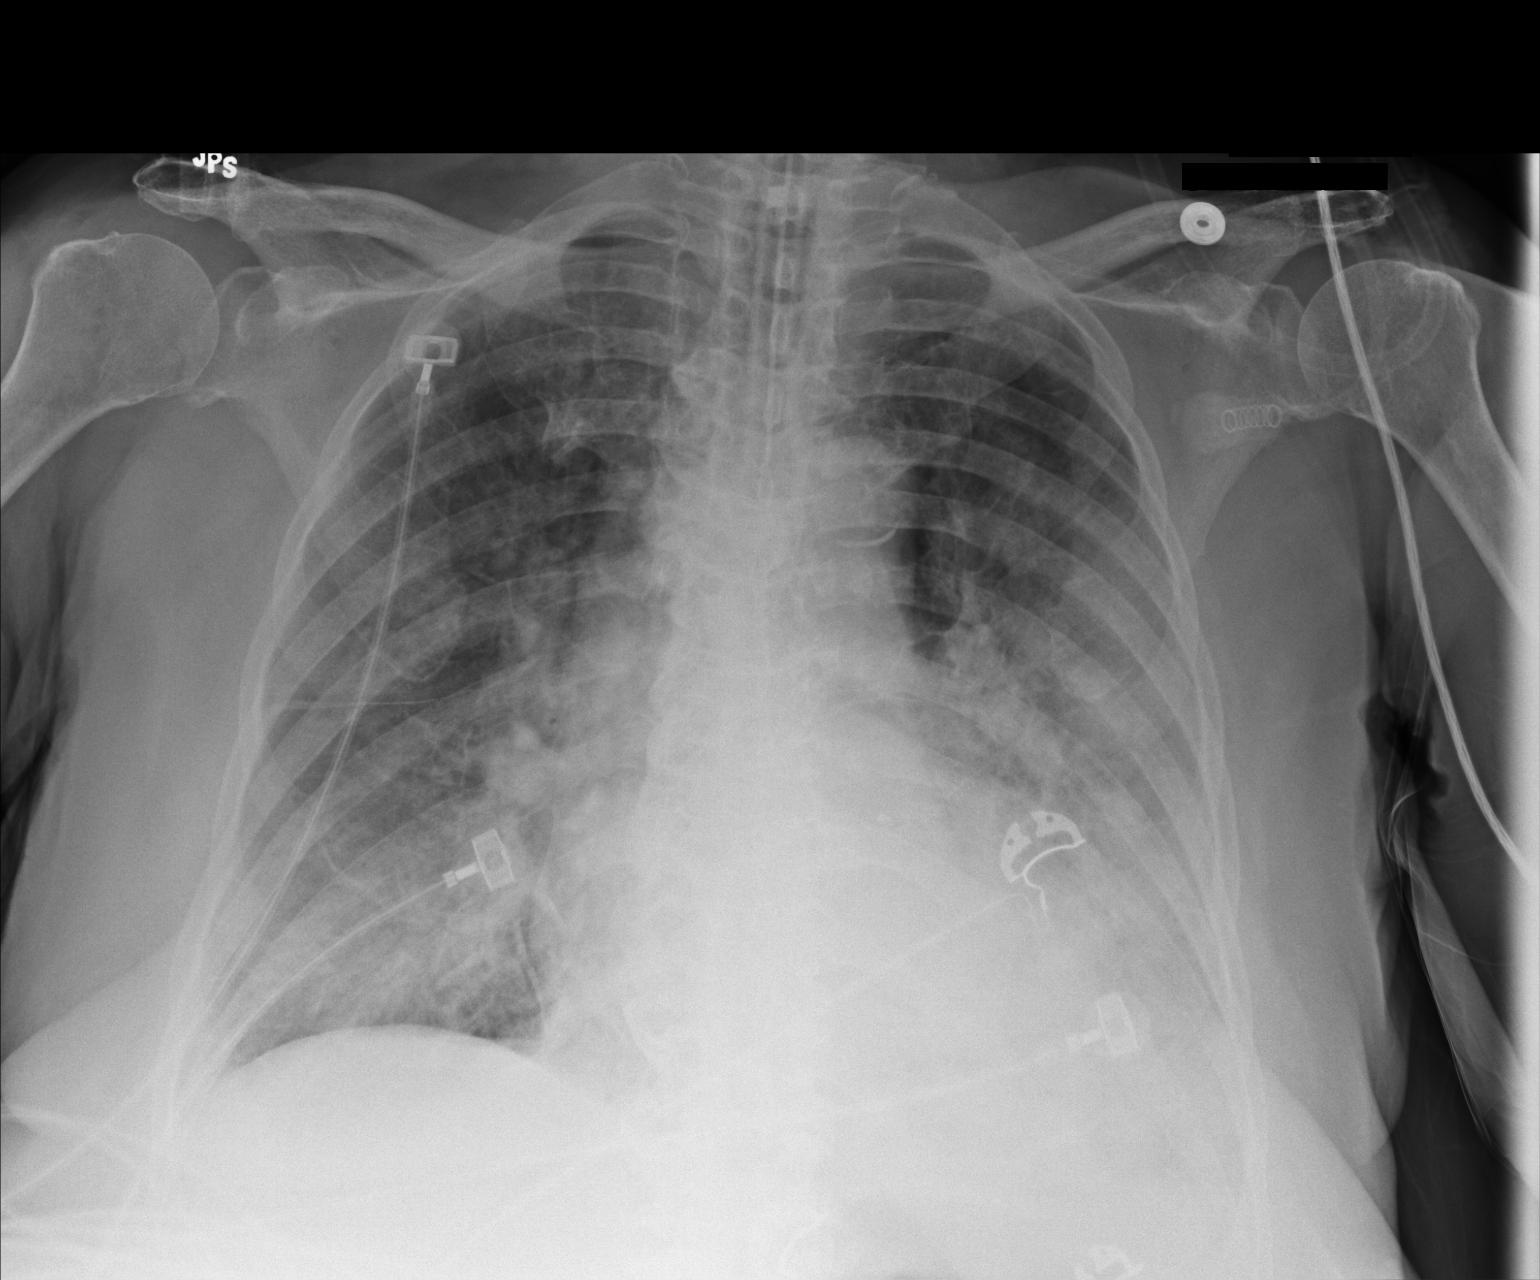

[1 of 1 positions shown; findings below may reference images not displayed]

FINDINGS: Endotracheal tube tip in satisfactory position
approximately 5 cm above the carina.  Interval worsening of
consolidation at the right lung base.  No significant change in the
consolidation in the left lung.
IMPRESSION: 1.  Endotracheal tube tip in satisfactory position approximately 5
cm above the carina.
2.  Worsening pneumonia at the right lung base.  Stable left lung
pneumonia since earlier in the day.

## 2012-11-19 IMAGING — CR DG CHEST 1V PORT
1 series · 1 of 1 positions shown · non-contrast
Comparison: Two-view chest x-ray 02/13/2012.

CLINICAL DATA: Cough.  Shortness of breath.  Nausea and vomiting.

PORTABLE CHEST - 1 VIEW [DATE]/0282 2502 hours:

[AP]
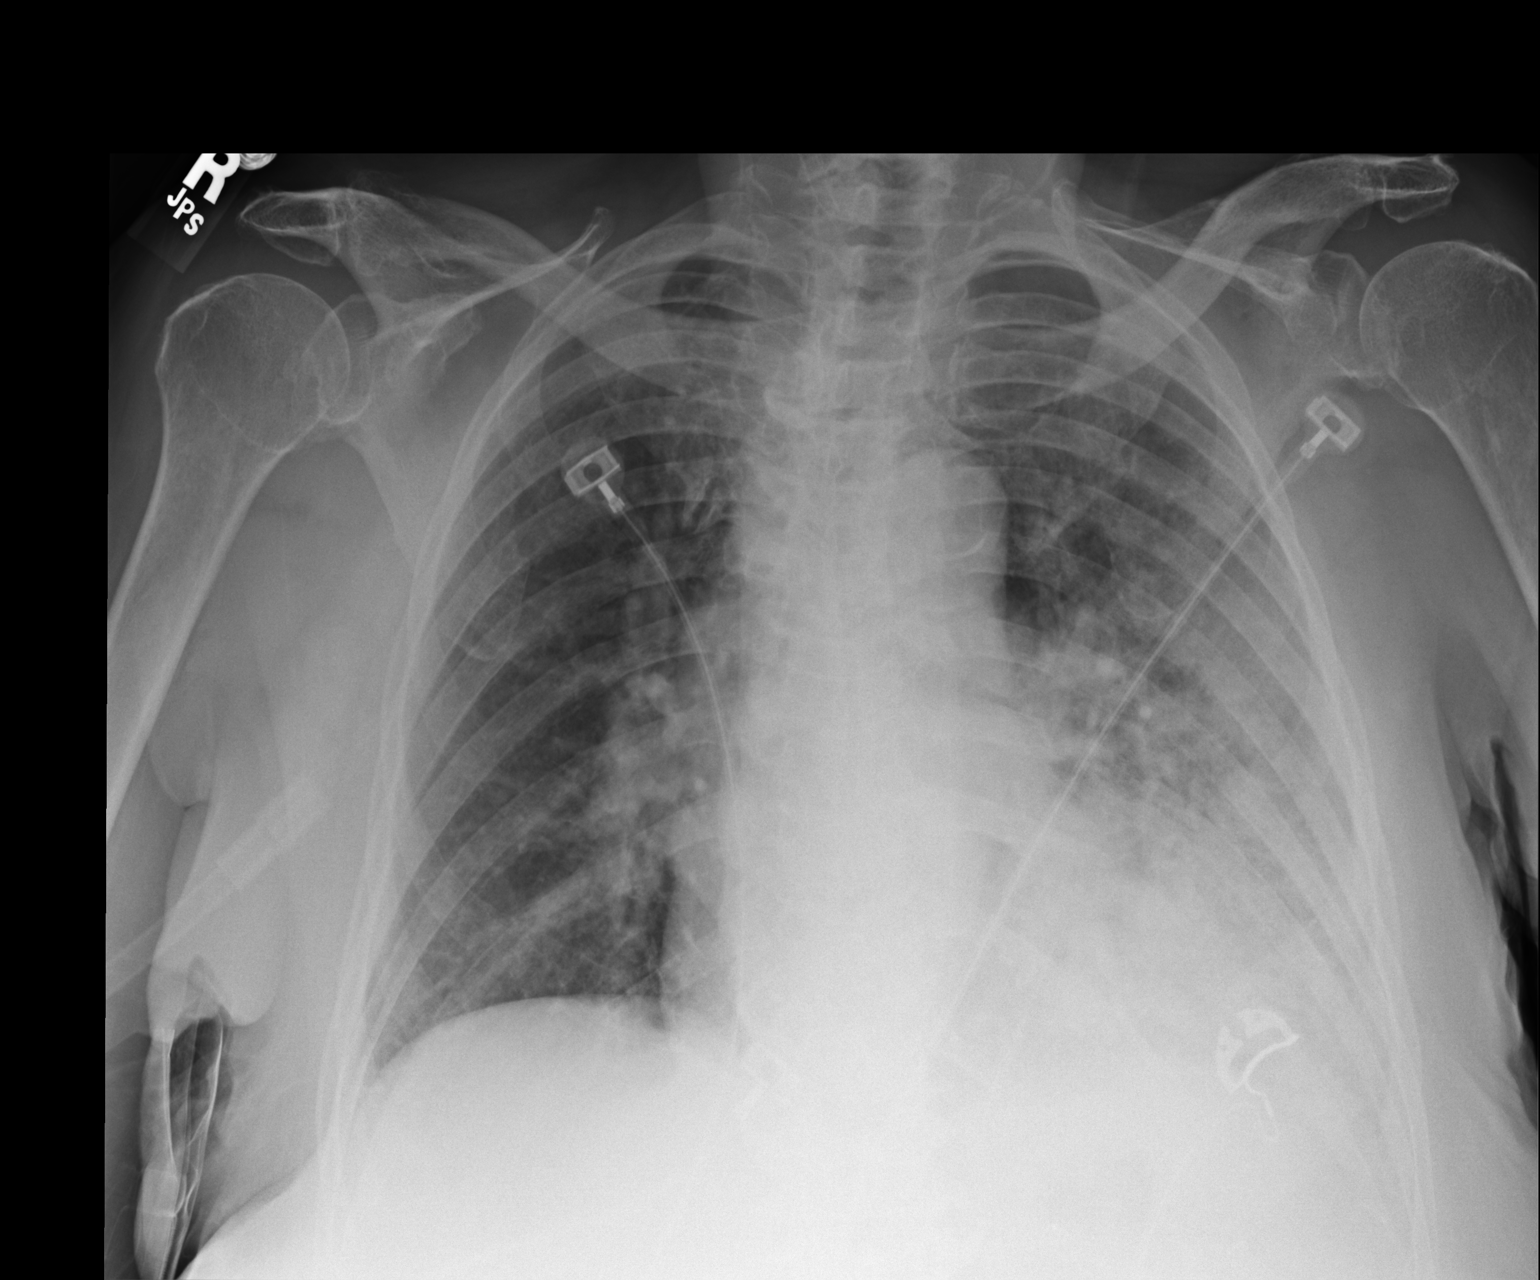

[1 of 1 positions shown; findings below may reference images not displayed]

FINDINGS: Interval development of airspace consolidation throughout
much of the left lung, greatest in the lower lobe.  Minimal patchy
airspace opacities at the right lung base.  Right upper lung
remains clear.  Cardiac silhouette upper normal in size to slightly
enlarged but stable.  Pulmonary vascularity normal.
IMPRESSION: Pneumonia throughout much of the left lung, worst in the left lower
lobe.  Minimal patchy pneumonia at the right lung base.

## 2012-11-20 IMAGING — CR DG CHEST 1V PORT
1 series · 1 of 1 positions shown · non-contrast
Comparison: 06/03/2012.

CLINICAL DATA: Assess endotracheal tube.

PORTABLE CHEST - 1 VIEW

[AP]
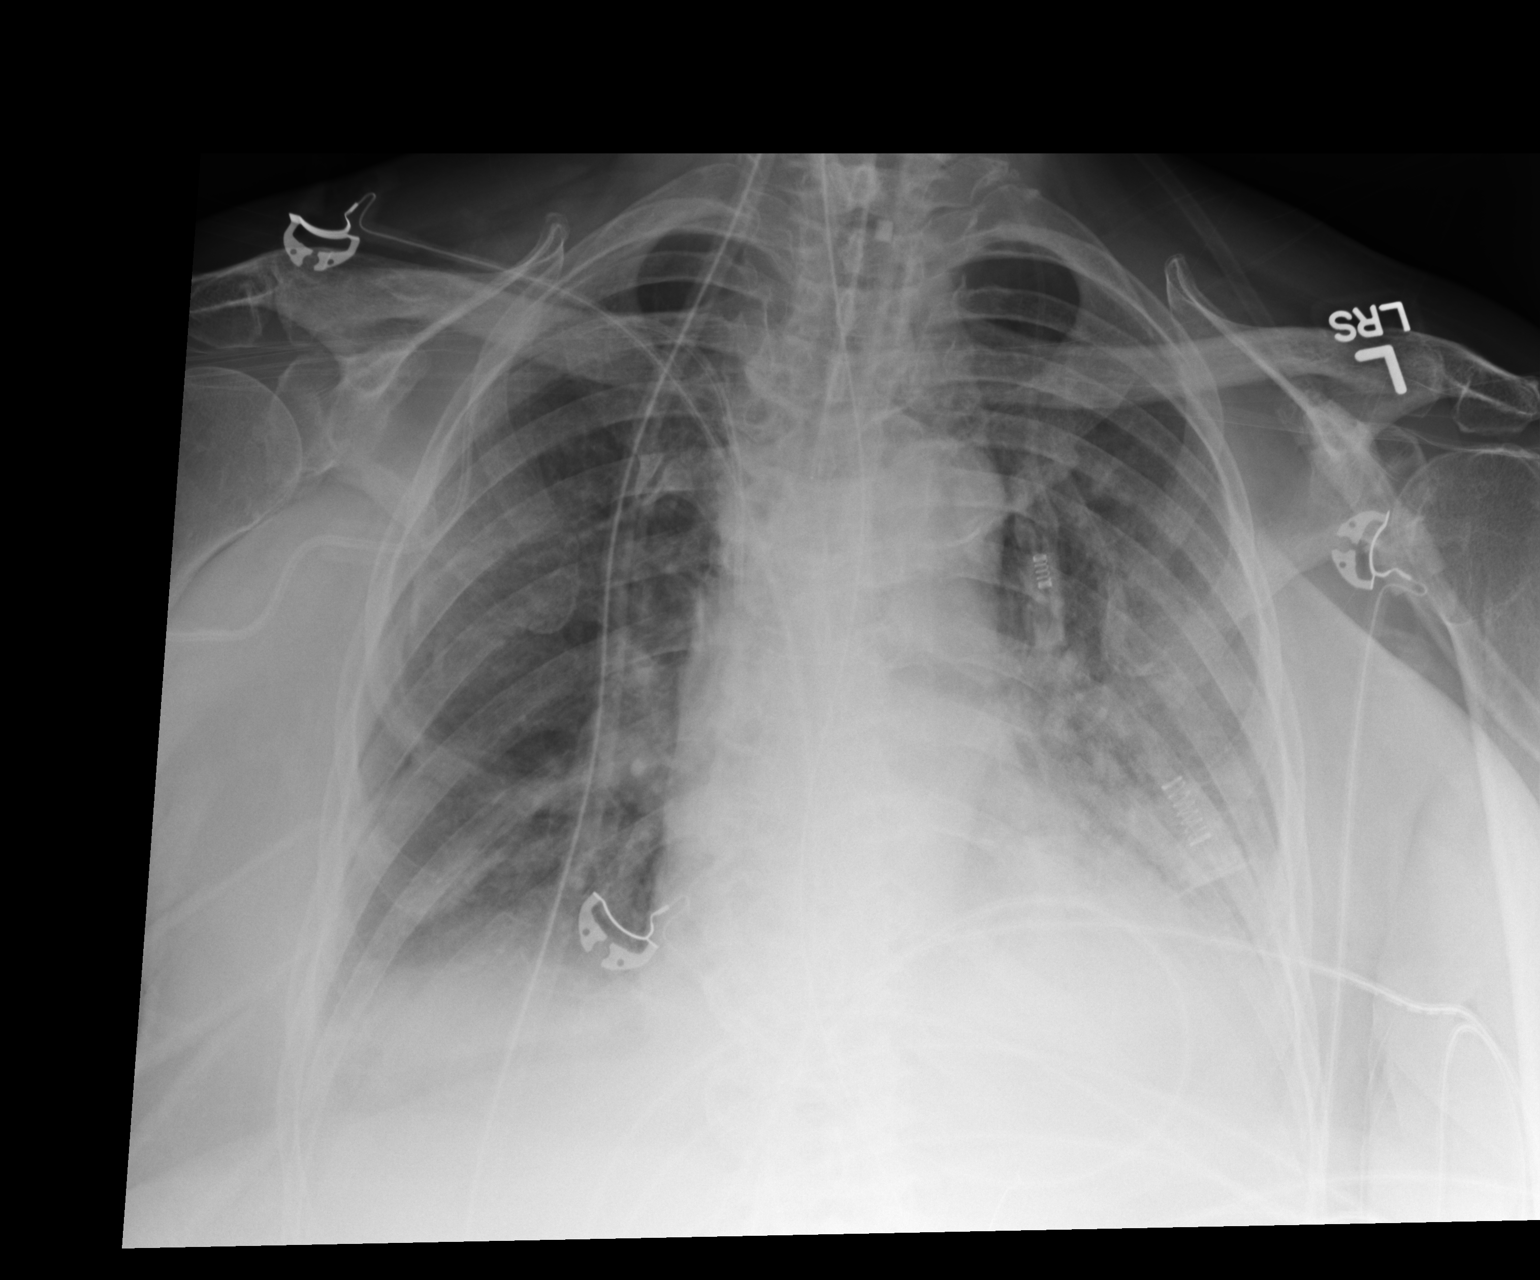

[1 of 1 positions shown; findings below may reference images not displayed]

FINDINGS: Endotracheal tube is in satisfactory position.  Right
subclavian central line tip projects over the SVC.  Heart size
stable.  Thoracic aorta is calcified.  There has been some interval
improvement in aeration at the right lung base.  Mild diffuse
bilateral air space disease, left greater than right, persists.
Both lower lobe consolidation may have improved slightly in the
interval as well.  Probable small bilateral pleural effusions.
IMPRESSION: 1.  Mild diffuse bilateral air space disease, left greater than
right, with some improvement in bibasilar aeration.
2.  Small bilateral pleural effusions.

## 2012-12-24 IMAGING — CR DG CHEST 2V
2 series · 2 of 2 positions shown · non-contrast
Comparison: Chest x-ray of 06/12/2012

CLINICAL DATA: Recent pneumonia requiring hospitalization, follow-
up

CHEST - 2 VIEW

[view not recorded (1 of 2)]
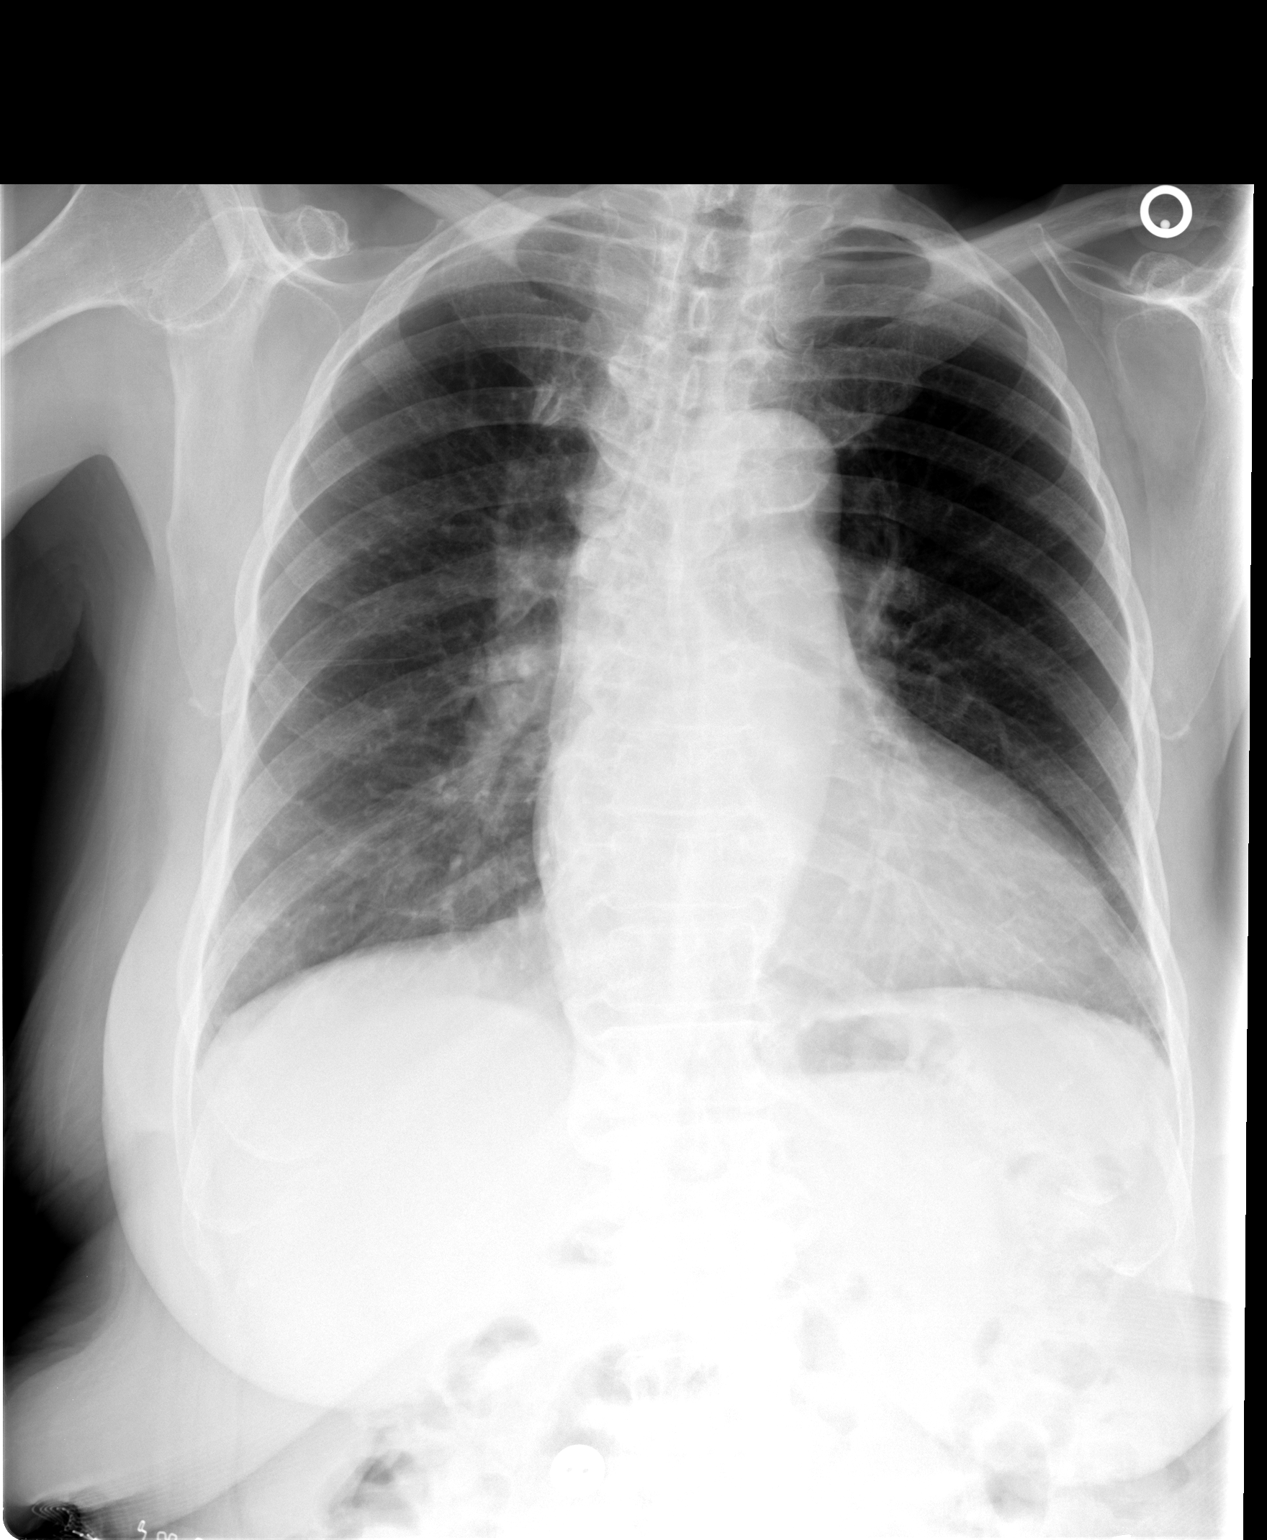

[view not recorded (2 of 2)]
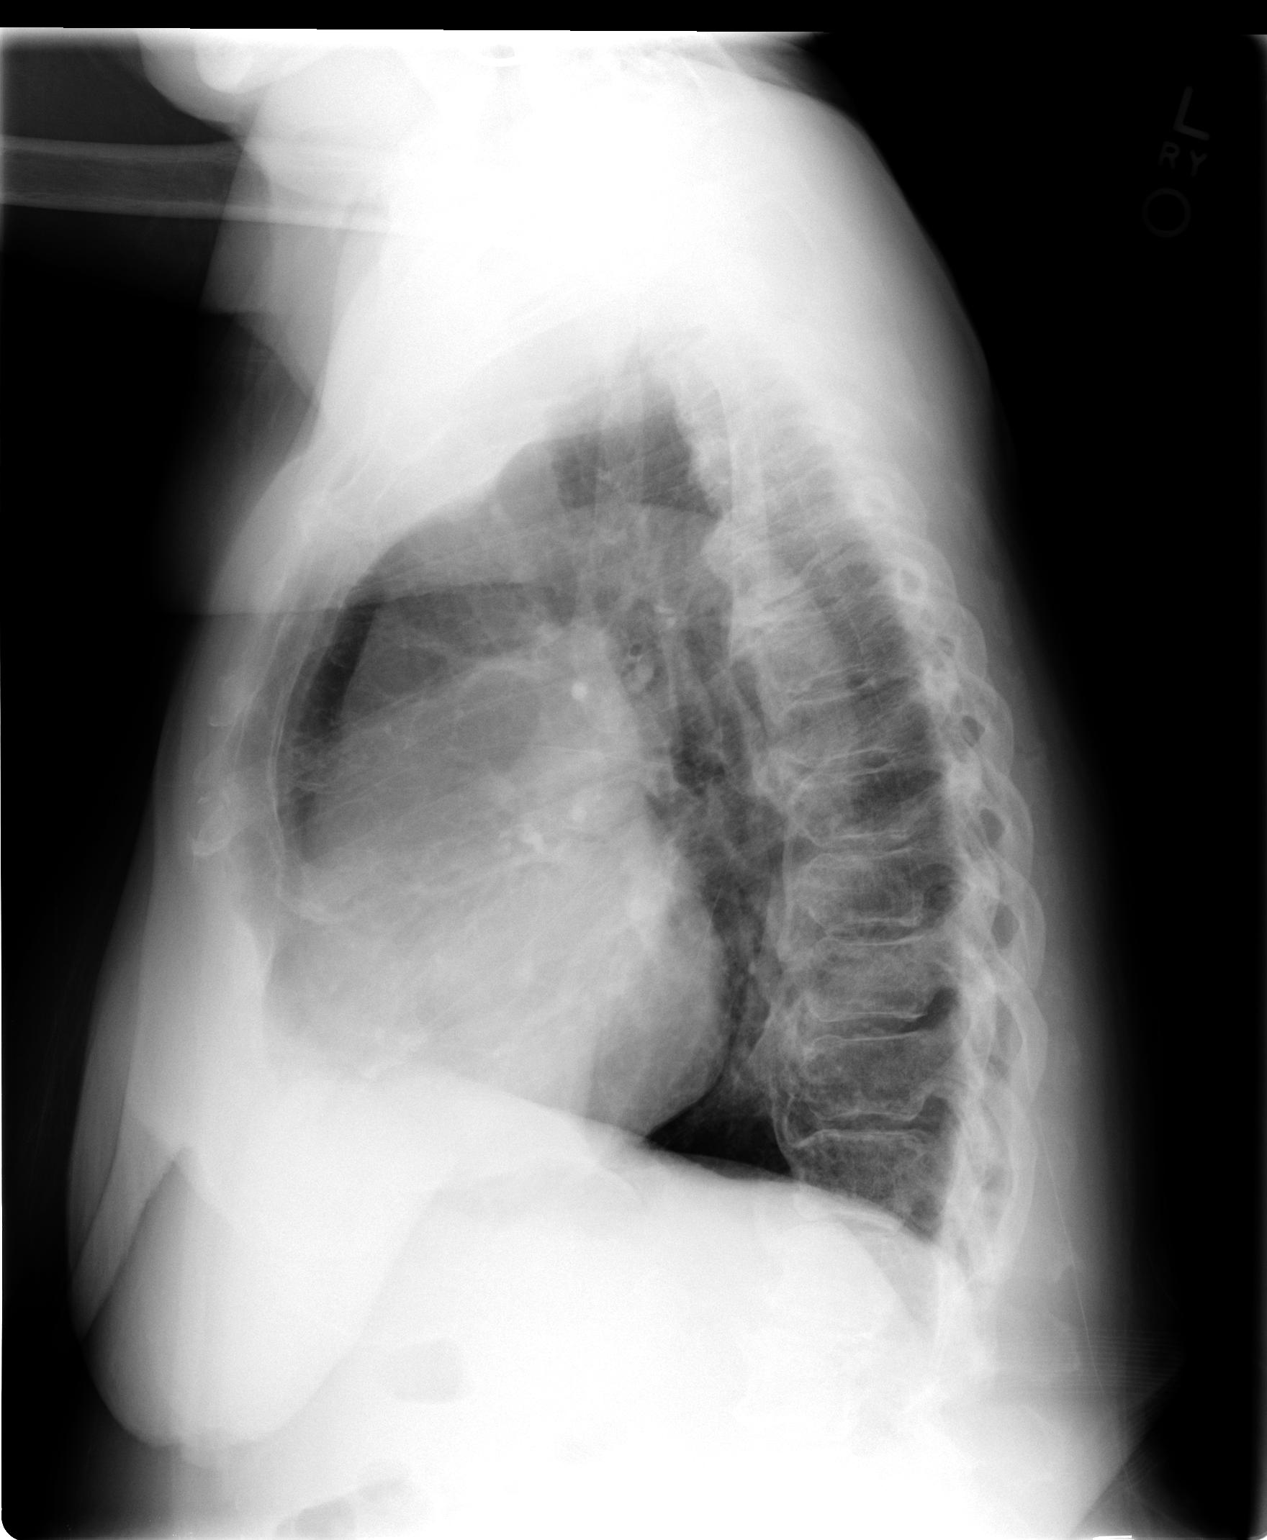

[2 of 2 positions shown; findings below may reference images not displayed]

FINDINGS: The opacity at the left lung base has cleared.  No
infiltrate or effusion is seen.  Cardiomegaly is stable.  There is
a mild thoracic scoliosis present with diffuse degenerative change.
IMPRESSION: No active lung disease.  The opacity noted previously at the left
lung base has cleared.  Stable cardiomegaly.

## 2013-01-30 ENCOUNTER — Emergency Department (HOSPITAL_COMMUNITY): Payer: 59

## 2013-01-30 ENCOUNTER — Emergency Department (HOSPITAL_COMMUNITY)
Admission: EM | Admit: 2013-01-30 | Discharge: 2013-01-30 | Disposition: A | Discharge status: Home / Self Care | Payer: 59 | Attending: Emergency Medicine | Admitting: Emergency Medicine

## 2013-01-30 ENCOUNTER — Encounter (HOSPITAL_COMMUNITY): Payer: Self-pay | Admitting: Emergency Medicine

## 2013-01-30 DIAGNOSIS — I739 Peripheral vascular disease, unspecified: Secondary | ICD-10-CM | POA: Insufficient documentation

## 2013-01-30 DIAGNOSIS — I10 Essential (primary) hypertension: Secondary | ICD-10-CM | POA: Insufficient documentation

## 2013-01-30 DIAGNOSIS — I428 Other cardiomyopathies: Secondary | ICD-10-CM | POA: Insufficient documentation

## 2013-01-30 DIAGNOSIS — I5043 Acute on chronic combined systolic (congestive) and diastolic (congestive) heart failure: Secondary | ICD-10-CM | POA: Insufficient documentation

## 2013-01-30 DIAGNOSIS — G40909 Epilepsy, unspecified, not intractable, without status epilepticus: Secondary | ICD-10-CM | POA: Insufficient documentation

## 2013-01-30 DIAGNOSIS — F411 Generalized anxiety disorder: Secondary | ICD-10-CM | POA: Insufficient documentation

## 2013-01-30 DIAGNOSIS — M129 Arthropathy, unspecified: Secondary | ICD-10-CM | POA: Insufficient documentation

## 2013-01-30 DIAGNOSIS — Z8669 Personal history of other diseases of the nervous system and sense organs: Secondary | ICD-10-CM | POA: Insufficient documentation

## 2013-01-30 DIAGNOSIS — R42 Dizziness and giddiness: Secondary | ICD-10-CM | POA: Insufficient documentation

## 2013-01-30 DIAGNOSIS — K219 Gastro-esophageal reflux disease without esophagitis: Secondary | ICD-10-CM | POA: Insufficient documentation

## 2013-01-30 DIAGNOSIS — J159 Unspecified bacterial pneumonia: Secondary | ICD-10-CM | POA: Insufficient documentation

## 2013-01-30 DIAGNOSIS — Z7982 Long term (current) use of aspirin: Secondary | ICD-10-CM | POA: Insufficient documentation

## 2013-01-30 DIAGNOSIS — E785 Hyperlipidemia, unspecified: Secondary | ICD-10-CM | POA: Insufficient documentation

## 2013-01-30 DIAGNOSIS — E119 Type 2 diabetes mellitus without complications: Secondary | ICD-10-CM | POA: Insufficient documentation

## 2013-01-30 DIAGNOSIS — Z79899 Other long term (current) drug therapy: Secondary | ICD-10-CM | POA: Insufficient documentation

## 2013-01-30 DIAGNOSIS — Z872 Personal history of diseases of the skin and subcutaneous tissue: Secondary | ICD-10-CM | POA: Insufficient documentation

## 2013-01-30 DIAGNOSIS — Z8679 Personal history of other diseases of the circulatory system: Secondary | ICD-10-CM | POA: Insufficient documentation

## 2013-01-30 DIAGNOSIS — Z8672 Personal history of thrombophlebitis: Secondary | ICD-10-CM | POA: Insufficient documentation

## 2013-01-30 DIAGNOSIS — R112 Nausea with vomiting, unspecified: Secondary | ICD-10-CM | POA: Insufficient documentation

## 2013-01-30 LAB — CBC WITH DIFFERENTIAL/PLATELET
HCT: 35.1 % — ABNORMAL LOW (ref 36.0–46.0)
Hemoglobin: 11.4 g/dL — ABNORMAL LOW (ref 12.0–15.0)
Lymphocytes Relative: 8 % — ABNORMAL LOW (ref 12–46)
Lymphs Abs: 0.9 10*3/uL (ref 0.7–4.0)
Monocytes Absolute: 0.8 10*3/uL (ref 0.1–1.0)
Monocytes Relative: 7 % (ref 3–12)
Neutro Abs: 9.3 10*3/uL — ABNORMAL HIGH (ref 1.7–7.7)
Neutrophils Relative %: 84 % — ABNORMAL HIGH (ref 43–77)
RBC: 4.57 MIL/uL (ref 3.87–5.11)

## 2013-01-30 LAB — COMPREHENSIVE METABOLIC PANEL
Albumin: 3.7 g/dL (ref 3.5–5.2)
Alkaline Phosphatase: 75 U/L (ref 39–117)
BUN: 21 mg/dL (ref 6–23)
CO2: 27 mEq/L (ref 19–32)
Chloride: 98 mEq/L (ref 96–112)
GFR calc non Af Amer: 50 mL/min — ABNORMAL LOW (ref >90)
Glucose, Bld: 128 mg/dL — ABNORMAL HIGH (ref 70–99)
Potassium: 4 mEq/L (ref 3.5–5.1)
Total Bilirubin: 0.5 mg/dL (ref 0.3–1.2)

## 2013-01-30 LAB — URINALYSIS, ROUTINE W REFLEX MICROSCOPIC
Glucose, UA: NEGATIVE mg/dL
Ketones, ur: NEGATIVE mg/dL
Protein, ur: NEGATIVE mg/dL
Urobilinogen, UA: 1 mg/dL (ref 0.0–1.0)

## 2013-01-30 LAB — URINE MICROSCOPIC-ADD ON

## 2013-01-30 MED ORDER — LEVOFLOXACIN 750 MG PO TABS: 750.0000 mg | ORAL_TABLET | Freq: Every day | ORAL | Status: DC

## 2013-01-30 MED ORDER — ACETAMINOPHEN 325 MG PO TABS
650.0000 mg | ORAL_TABLET | Freq: Once | ORAL | Status: AC
  Administered 2013-01-30: 650 mg via ORAL
  Filled 2013-01-30: qty 2

## 2013-01-30 MED ORDER — SODIUM CHLORIDE 0.9 % IV SOLN
INTRAVENOUS | Status: DC
  Administered 2013-01-30 (×3): via INTRAVENOUS

## 2013-01-30 MED ORDER — ONDANSETRON HCL 4 MG/2ML IJ SOLN
4.0000 mg | Freq: Once | INTRAMUSCULAR | Status: AC
  Administered 2013-01-30: 4 mg via INTRAVENOUS
  Filled 2013-01-30: qty 2

## 2013-01-30 MED ORDER — LEVOFLOXACIN IN D5W 750 MG/150ML IV SOLN
750.0000 mg | Freq: Once | INTRAVENOUS | Status: AC
  Administered 2013-01-30: 750 mg via INTRAVENOUS
  Filled 2013-01-30: qty 150

## 2013-01-30 NOTE — ED Notes (Signed)
Patient advised she had a regular check up with her doctor yesterday and was fine.  Patient complains of dizziness this morning with vomiting.

## 2013-01-30 NOTE — ED Notes (Signed)
Notified Dr. Blinda Leatherwood about Patient temp 103.1, oral temp.

## 2013-01-30 NOTE — ED Provider Notes (Signed)
History     CSN: [id number]  Arrival date & time 01/30/13  6213   First MD Initiated Contact with Patient 01/30/13 [phone]      Chief Complaint  Patient presents with  . Dizziness  . Emesis  . Cough    (Consider location/radiation/quality/duration/timing/severity/associated sxs/prior treatment) HPI Comments: Patient woke this morning feeling dizzy. She reports that she had some nausea and vomiting associated with the dizziness earlier. This has resolved. She has not had any abdominal pain. Patient denies headache and vision change. No neck pain or stiffness. She has had a cough. Patient denies chest pain and shortness of breath. She reports that the dizziness is almost gone now.  Patient is a 79 y.o. female presenting with vomiting and cough.  Emesis Associated symptoms: no diarrhea   Cough   Past Medical History  Diagnosis Date  . Diabetes mellitus   . Hypertension   . Hyperlipemia   . Phlebitis 11/30/11    RLE  . Skin ulcer(s) 11/30/11    BLE; draining  . GERD (gastroesophageal reflux disease)   . Arthritis   . Anxiety   . Peripheral vascular disease   . Neuromuscular disorder     numbness feet  . Seizure, convulsive   . Cardiomyopathy, EF 35-40% 06/05/2012  . Diastolic dysfunction, left ventricle, grade 2 by echo 06/04/12 06/05/2012  . TR (tricuspid regurgitation), mod 06/05/2012  . Acute on chronic systolic and diastolic heart failure, NYHA class 2 06/13/2012    Past Surgical History  Procedure Laterality Date  . Abdominal hysterectomy    . Cataract extraction w/ intraocular lens  implant, bilateral  1990's  . Blood clot  1990's    RLE; "cut out"  . Eye surgery    . Incision and drainage of wound  01/21/2012    Procedure: IRRIGATION AND DEBRIDEMENT WOUND;  Surgeon: Wayland Denis, DO;  Location: [facility];  Service: Plastics;  Laterality: Bilateral;  I&D bilateral lower extremities and placement of wound vac left lower leg  . Skin split graft  02/18/2012     Procedure: SKIN GRAFT SPLIT THICKNESS;  Surgeon: Wayland Denis, DO;  Location: [facility];  Service: Plastics;  Laterality: Bilateral;  irrigation bilateral lower extremities, ACell placement bilateral lower legs, Wound Vac placement left lower leg  . Skin split graft  03/17/2012    Procedure: SKIN GRAFT SPLIT THICKNESS;  Surgeon: Wayland Denis, DO;  Location: [facility];  Service: Plastics;  Laterality: Left;  LEFT LEG SKIN GRAFT WITH SPLIT THICKNESS   . Application of wound vac  03/17/2012    Procedure: APPLICATION OF WOUND VAC;  Surgeon: Wayland Denis, DO;  Location: [facility];  Service: Plastics;  Laterality: Left;  open ulcer    No family history on file.  History  Substance Use Topics  . Smoking status: Never Smoker   . Smokeless tobacco: Never Used  . Alcohol Use: No     Comment: 11/30/11 "used to drink; not much; don't drink anymore"    OB History   Grav Para Term Preterm Abortions TAB SAB Ect Mult Living                  Review of Systems  Respiratory: Positive for cough.   Gastrointestinal: Positive for nausea and vomiting. Negative for diarrhea.  Neurological: Positive for dizziness.    Allergies  Review of patient's allergies indicates no known allergies.  Home Medications   Current Outpatient Rx  Name  Route  Sig  Dispense  Refill  . acidophilus (RISAQUAD) CAPS   Oral   Take 1 capsule by mouth daily.         Marland Kitchen ALPRAZolam (XANAX) 0.25 MG tablet   Oral   Take 0.25 mg by mouth daily as needed. For anxiety         . aspirin 81 MG chewable tablet   Oral   Chew 81 mg by mouth daily.           . calcium-vitamin D (OSCAL WITH D) 500-200 MG-UNIT per tablet   Oral   Take 1 tablet by mouth daily.         . furosemide (LASIX) 40 MG tablet   Oral   Take 1 tablet (40 mg total) by mouth daily.   30 tablet   11   . gabapentin (NEURONTIN) 300 MG capsule   Oral   Take 300 mg by mouth 2 (two) times daily.          Marland Kitchen losartan (COZAAR) 25 MG  tablet   Oral   Take 1 tablet (25 mg total) by mouth at bedtime.   30 tablet   11   . losartan (COZAAR) 50 MG tablet   Oral   Take 1 tablet (50 mg total) by mouth daily.   30 tablet   11   . metFORMIN (GLUCOPHAGE) 1000 MG tablet   Oral   Take 1,000 mg by mouth 2 (two) times daily with a meal.           . metoprolol (LOPRESSOR) 100 MG tablet   Oral   Take 1 tablet (100 mg total) by mouth 2 (two) times daily.   60 tablet   11   . Multiple Vitamins-Minerals (CENTRUM SILVER PO)   Oral   Take 1 tablet by mouth daily.         Marland Kitchen omeprazole (PRILOSEC) 40 MG capsule   Oral   Take 40 mg by mouth 2 (two) times daily.          . potassium chloride SA (K-DUR,KLOR-CON) 20 MEQ tablet   Oral   Take 1 tablet (20 mEq total) by mouth daily.   30 tablet   11   . traMADol (ULTRAM) 50 MG tablet   Oral   Take 50 mg by mouth every 6 (six) hours as needed. Maximum dose= 8 tablets per day. For pain         . triamcinolone (KENALOG) 0.025 % ointment   Topical   Apply 1 application topically 2 (two) times daily. To legs         . vitamin C (ASCORBIC ACID) 500 MG tablet   Oral   Take 500 mg by mouth daily.         . Zinc 50 MG TABS   Oral   Take 1 tablet by mouth daily.           There were no vitals taken for this visit.  Physical Exam  Constitutional: She is oriented to person, place, and time. She appears well-developed and well-nourished. No distress.  HENT:  Head: Normocephalic and atraumatic.  Right Ear: Hearing normal.  Nose: Nose normal.  Mouth/Throat: Oropharynx is clear and moist and mucous membranes are normal.  Eyes: Conjunctivae and EOM are normal. Pupils are equal, round, and reactive to light.  Neck: Normal range of motion. Neck supple.  Cardiovascular: Normal rate, regular rhythm, S1 normal and S2 normal.  Exam reveals no gallop and no friction rub.   No  murmur heard. Pulmonary/Chest: Effort normal and breath sounds normal. No respiratory distress.  She exhibits no tenderness.  Abdominal: Soft. Normal appearance and bowel sounds are normal. There is no hepatosplenomegaly. There is no tenderness. There is no rebound, no guarding, no tenderness at McBurney's point and negative Murphy's sign. No hernia.  Musculoskeletal: Normal range of motion.  Neurological: She is alert and oriented to person, place, and time. She has normal strength. No cranial nerve deficit or sensory deficit. Coordination normal. GCS eye subscore is 4. GCS verbal subscore is 5. GCS motor subscore is 6.  Skin: Skin is warm, dry and intact. No rash noted. No cyanosis.  Psychiatric: She has a normal mood and affect. Her speech is normal and behavior is normal. Thought content normal.    ED Course  Procedures (including critical care time)  Labs Reviewed  CBC WITH DIFFERENTIAL - Abnormal; Notable for the following:    WBC 11.0 (*)    Hemoglobin 11.4 (*)    HCT 35.1 (*)    MCV 76.8 (*)    MCH 24.9 (*)    Neutrophils Relative 84 (*)    Neutro Abs 9.3 (*)    Lymphocytes Relative 8 (*)    All other components within normal limits  COMPREHENSIVE METABOLIC PANEL - Abnormal; Notable for the following:    Sodium 134 (*)    Glucose, Bld 128 (*)    GFR calc non Af Amer 50 (*)    GFR calc Af Amer 58 (*)    All other components within normal limits  URINALYSIS, ROUTINE W REFLEX MICROSCOPIC - Abnormal; Notable for the following:    Hgb urine dipstick SMALL (*)    Leukocytes, UA MODERATE (*)    All other components within normal limits  URINE MICROSCOPIC-ADD ON - Abnormal; Notable for the following:    Squamous Epithelial / LPF FEW (*)    Bacteria, UA FEW (*)    All other components within normal limits  CULTURE, BLOOD (ROUTINE X 2)  CULTURE, BLOOD (ROUTINE X 2)  URINE CULTURE   Dg Chest 2 View  01/30/2013  *RADIOLOGY REPORT*  Clinical Data: Cough and fever.  CHEST - 2 VIEW  Comparison: 07/08/2012  Findings: Hazy peripheral infiltrates in both upper lobes, left more  than right, including the lingula.  Slight chronic cardiomegaly.  Vascularity is normal.  No effusions. No acute osseous abnormality.  IMPRESSION: Bilateral upper lobe pneumonia, left more than right.   Original Report Authenticated By: Francene Boyers, M.D.    Ct Head Wo Contrast  01/30/2013  *RADIOLOGY REPORT*  Clinical Data: Dizziness.  Altered mental status.  CT HEAD WITHOUT CONTRAST  Technique:  Contiguous axial images were obtained from the base of the skull through the vertex without contrast.  Comparison: July 2013  Findings: There is some motion degradation.  The brain shows generalized atrophy.  There are chronic small vessel changes within the hemispheric deep white matter.  No sign of acute infarction, mass lesion, hemorrhage, hydrocephalus or extra-axial collection. There is atherosclerotic calcification of the major vessels at the base of the brain.  Calvarium is unremarkable.  No fluid in the sinuses.  There is a small amount of fluid in the mastoid air cells on the right.  IMPRESSION: Brain atrophy and chronic small vessel changes of the deep white matter.  No acute intracranial finding.  Small amount of fluid in the mastoid air cells on the right.   Original Report Authenticated By: Paulina Fusi, M.D.  Diagnosis: pneumonia    MDM  Patient presents to the ER for evaluation of dizziness. Patient reports that she saw her Dr. Burgess Estelle and everything was fine. Upon awakening today she had dizziness that was non-vertiginous. She did not have headache, chest pain or shortness of breath. Dizziness is now improved. Patient does report that she has had a cough. Workup reveals left upper lobe pneumonia. Patient is breathing comfortably without any hypoxia.she therefore was administered Levaquin here in the ER will be discharged with outpatient Levaquin. Followup with primary Dr. In the office this week. Return to the ER for any worsening symptoms.        Gilda Crease,  MD 01/30/13 [phone]

## 2013-01-31 LAB — URINE CULTURE: Colony Count: >=100000

## 2013-02-05 LAB — CULTURE, BLOOD (ROUTINE X 2): Culture: NO GROWTH

## 2013-06-08 ENCOUNTER — Other Ambulatory Visit: Payer: Self-pay | Admitting: *Deleted

## 2013-06-08 MED ORDER — POTASSIUM CHLORIDE CRYS ER 20 MEQ PO TBCR: 20.0000 meq | EXTENDED_RELEASE_TABLET | Freq: Every day | ORAL | Status: DC

## 2013-06-12 ENCOUNTER — Encounter: Payer: Self-pay | Admitting: *Deleted

## 2013-06-12 ENCOUNTER — Encounter: Payer: Self-pay | Admitting: Cardiovascular Disease

## 2013-06-15 ENCOUNTER — Encounter: Payer: Self-pay | Admitting: Physician Assistant

## 2013-06-15 ENCOUNTER — Ambulatory Visit (INDEPENDENT_AMBULATORY_CARE_PROVIDER_SITE_OTHER): Payer: 59 | Admitting: Physician Assistant

## 2013-06-15 VITALS — BP 154/74 | HR 76 | Ht 62.0 in | Wt 196.8 lb

## 2013-06-15 DIAGNOSIS — I420 Dilated cardiomyopathy: Secondary | ICD-10-CM

## 2013-06-15 DIAGNOSIS — I428 Other cardiomyopathies: Secondary | ICD-10-CM

## 2013-06-15 DIAGNOSIS — I519 Heart disease, unspecified: Secondary | ICD-10-CM

## 2013-06-15 DIAGNOSIS — I429 Cardiomyopathy, unspecified: Secondary | ICD-10-CM

## 2013-06-15 DIAGNOSIS — I2510 Atherosclerotic heart disease of native coronary artery without angina pectoris: Secondary | ICD-10-CM

## 2013-06-15 DIAGNOSIS — IMO0001 Reserved for inherently not codable concepts without codable children: Secondary | ICD-10-CM | POA: Insufficient documentation

## 2013-06-15 DIAGNOSIS — I10 Essential (primary) hypertension: Secondary | ICD-10-CM

## 2013-06-15 MED ORDER — HYDRALAZINE HCL 25 MG PO TABS: 25.0000 mg | ORAL_TABLET | Freq: Two times a day (BID) | ORAL | Status: DC

## 2013-06-15 NOTE — Assessment & Plan Note (Signed)
I will arrange for nutritional consult

## 2013-06-15 NOTE — Progress Notes (Signed)
Date:  06/15/2013   ID:  [name], DOB [birth year], MRN [id number]  PCP:  [name]., MD  Primary Cardiologist:  Allyson Sabal    History of Present Illness: [name] is a 79 y.o. female who is obese with a BMI of 35.9 and has history of nonischemic cardiomyopathy with ejection fraction last checked 06/03/2012 which was 35-40%. A cardiac catheterization by Dr. Nicholaus Bloom 06/06/2012 and revealed no significant CAD. He said that in the setting of sepsis which required intubation. Her history also includes hypertension, hyperlipidemia diabetes mellitus, peripheral vascular disease diastolic dysfunction grade 2, moderate tricuspid regurgitation.    Patient presents for her six-month followup.  She is been seen the PA at her primary care provider's office due to elevated blood pressures. She currently denies nausea, vomiting, fever, chest pain, shortness of breath, orthopnea, dizziness, PND, cough, congestion, abdominal pain, hematochezia, melena, lower extremity edema, claudication.  Wt Readings from Last 3 Encounters:  06/15/13 196 lb 12.8 oz (89.268 kg)  06/13/12 168 lb 14 oz (76.6 kg)  06/13/12 168 lb 14 oz (76.6 kg)     Past Medical History  Diagnosis Date  . Diabetes mellitus   . Hypertension   . Hyperlipemia   . Phlebitis 11/30/11    RLE  . Skin ulcer(s) 11/30/11    BLE; draining  . GERD (gastroesophageal reflux disease)   . Arthritis   . Anxiety   . Peripheral vascular disease   . Neuromuscular disorder     numbness feet  . Seizure, convulsive   . Cardiomyopathy, EF 35-40% 06/05/2012  . Diastolic dysfunction, left ventricle, grade 2 by echo 06/04/12 06/05/2012  . TR (tricuspid regurgitation), mod 06/05/2012  . Acute on chronic systolic and diastolic heart failure, NYHA class 2 06/13/2012  . H/O echocardiogram 06/03/2012    EF 35-40%; systolic function moderately reduced; grade 2 diastolic dysfunction; mild AV regurg.; mod MV regurg.; LA severly dilated; mild-mod  tricuspid valve regurg (acute care setting)  . History of nuclear stress test 06/29/2008    normal pattern of perfusion; negative for ischemia; low risk scan    Current Outpatient Prescriptions  Medication Sig Dispense Refill  . acidophilus (RISAQUAD) CAPS Take 1 capsule by mouth daily.      Marland Kitchen ALPRAZolam (XANAX) 0.25 MG tablet Take 0.25 mg by mouth daily as needed for anxiety. For anxiety      . aspirin 81 MG chewable tablet Chew 81 mg by mouth daily.        . benzonatate (TESSALON) 200 MG capsule Take 1 capsule by mouth 3 (three) times daily.      . calcium-vitamin D (OSCAL WITH D) 500-200 MG-UNIT per tablet Take 1 tablet by mouth daily.      . furosemide (LASIX) 40 MG tablet Take 40 mg by mouth daily.      Marland Kitchen lisinopril (PRINIVIL,ZESTRIL) 40 MG tablet Take 1 tablet by mouth daily.      . metFORMIN (GLUCOPHAGE) 1000 MG tablet Take 500 mg by mouth 2 (two) times daily with a meal.       . metoprolol (LOPRESSOR) 100 MG tablet Take 100 mg by mouth 2 (two) times daily.      Marland Kitchen omega-3 acid ethyl esters (LOVAZA) 1 G capsule Take 2 g by mouth daily.      Marland Kitchen omeprazole (PRILOSEC) 40 MG capsule Take 40 mg by mouth 2 (two) times daily.       . potassium chloride SA (K-DUR,KLOR-CON) 20 MEQ tablet Take 1 tablet (  20 mEq total) by mouth daily.  30 tablet  6  . simvastatin (ZOCOR) 20 MG tablet Take 20 mg by mouth every evening.      . traMADol (ULTRAM) 50 MG tablet Take 50 mg by mouth every 6 (six) hours as needed. Maximum dose= 8 tablets per day. For pain      . vitamin C (ASCORBIC ACID) 500 MG tablet Take 500 mg by mouth daily.      . Zinc 50 MG TABS Take 1 tablet by mouth daily.      . hydrALAZINE (APRESOLINE) 25 MG tablet Take 1 tablet (25 mg total) by mouth 2 (two) times daily.  60 tablet  5   No current facility-administered medications for this visit.   Facility-Administered Medications Ordered in Other Visits  Medication Dose Route Frequency Provider Last Rate Last Dose  . ceFAZolin (ANCEF) IVPB 1  g/50 mL premix  1 g Intravenous 60 min Pre-Op [employer], DO      . HYDROmorphone (DILAUDID) injection 0.25-0.5 mg  0.25-0.5 mg Intravenous Q5 min PRN Raiford Simmonds, MD      . ondansetron (ZOFRAN) injection 4 mg  4 mg Intravenous Q6H PRN Raiford Simmonds, MD        Allergies:   No Known Allergies  Social History:  The patient  reports that she has never smoked. She has never used smokeless tobacco. She reports that she does not drink alcohol or use illicit drugs.   Family history:   Family History  Problem Relation Age of Onset  . Heart failure Mother   . Cancer Mother   . Cancer Father   . Diabetes Brother   . Diabetes Sister   . Cancer Sister     ROS:  Please see the history of present illness.  All other systems reviewed and negative.   PHYSICAL EXAM: VS:  BP 154/74  Pulse 76  Ht 5\' 2"  (1.575 m)  Wt 196 lb 12.8 oz (89.268 kg)  BMI 35.99 kg/m2 Obese, well developed, in no acute distress HEENT: Pupils are equal round react to light accommodation extraocular movements are intact.  Neck: no JVDNo cervical lymphadenopathy. Cardiac: Regular rate and rhythm without murmurs rubs or gallops. Lungs:  clear to auscultation bilaterally, no wheezing, rhonchi or rales Abd: soft, nontender, positive bowel sounds all quadrants, no hepatosplenomegaly Ext: trace lower extremity edema.  2+ radial and dorsalis pedis pulses. Skin: warm and dry Neuro:  Grossly normal  EKG:  Normal sinus rhythm rate 76 beats per minute   ASSESSMENT AND PLAN:  Problem List Items Addressed This Visit   Obesity, Class II, BMI 35-39.9, with comorbidity     I will arrange for nutritional consult    HTN (hypertension) (Chronic)     Blood pressure is elevated into the 150s today. Patient brought a diary of blood pressures since June they're consistently elevated above 130 systolic with blood pressures reaching into the 150s and 160s. She is oriented Lisinopril and Lopressor. Will avoid calcium channel  blocker due to cardiomyopathy. We'll add hydralazine 25 mg twice daily and titrate as needed.    Relevant Medications      lisinopril (PRINIVIL,ZESTRIL) 40 MG tablet      omega-3 acid ethyl esters (LOVAZA) 1 G capsule      hydrALAZINE (APRESOLINE) tablet   Diastolic dysfunction, left ventricle, grade 2 by echo 06/04/12   Relevant Medications      lisinopril (PRINIVIL,ZESTRIL) 40 MG tablet      omega-3 acid  ethyl esters (LOVAZA) 1 G capsule      hydrALAZINE (APRESOLINE) tablet   Cardiomyopathy, EF 35-40%     Patient is well compensated and appears euvolemic.    Relevant Medications      lisinopril (PRINIVIL,ZESTRIL) 40 MG tablet      omega-3 acid ethyl esters (LOVAZA) 1 G capsule      hydrALAZINE (APRESOLINE) tablet   CAD (coronary artery disease), mild, 20% cath 06/06/12 (Chronic)   Relevant Medications      lisinopril (PRINIVIL,ZESTRIL) 40 MG tablet      omega-3 acid ethyl esters (LOVAZA) 1 G capsule      hydrALAZINE (APRESOLINE) tablet    Other Visit Diagnoses   Cardiomyopathy, dilated, nonischemic    -  Primary    Relevant Medications       lisinopril (PRINIVIL,ZESTRIL) 40 MG tablet       omega-3 acid ethyl esters (LOVAZA) 1 G capsule       hydrALAZINE (APRESOLINE) tablet    Other Relevant Orders       EKG 12-Lead

## 2013-06-15 NOTE — Assessment & Plan Note (Signed)
Patient is well compensated and appears euvolemic.

## 2013-06-15 NOTE — Assessment & Plan Note (Signed)
Blood pressure is elevated into the 150s today. Patient brought a diary of blood pressures since June they're consistently elevated above 130 systolic with blood pressures reaching into the 150s and 160s. She is oriented Lisinopril and Lopressor. Will avoid calcium channel blocker due to cardiomyopathy. We'll add hydralazine 25 mg twice daily and titrate as needed.

## 2013-06-15 NOTE — Patient Instructions (Addendum)
followup with Dr. Allyson Sabal in six months.

## 2013-07-03 ENCOUNTER — Telehealth: Payer: Self-pay | Admitting: Cardiovascular Disease

## 2013-07-03 NOTE — Telephone Encounter (Signed)
Returned call and spoke w/ [name].  Informed pt started on Hydralazine 25 mg at last OV.  Verbalized understanding.

## 2013-07-03 NOTE — Telephone Encounter (Signed)
John would like to know the new medication that this patient is on , that Judie Grieve has prescribe for her patient is there with him in the office ...  Please call   Thanks

## 2013-07-04 ENCOUNTER — Other Ambulatory Visit: Payer: Self-pay | Admitting: Cardiology

## 2013-07-06 NOTE — Telephone Encounter (Signed)
Rx was sent to pharmacy electronically. 

## 2013-07-29 ENCOUNTER — Other Ambulatory Visit: Payer: Self-pay | Admitting: Internal Medicine

## 2013-07-29 ENCOUNTER — Ambulatory Visit
Admission: RE | Admit: 2013-07-29 | Discharge: 2013-07-29 | Disposition: A | Discharge status: Home / Self Care | Payer: 59 | Source: Ambulatory Visit | Attending: Internal Medicine | Admitting: Internal Medicine

## 2013-07-29 DIAGNOSIS — R05 Cough: Secondary | ICD-10-CM

## 2013-08-05 ENCOUNTER — Other Ambulatory Visit (HOSPITAL_COMMUNITY): Payer: Self-pay | Admitting: Internal Medicine

## 2013-08-05 DIAGNOSIS — Z1231 Encounter for screening mammogram for malignant neoplasm of breast: Secondary | ICD-10-CM

## 2013-09-07 ENCOUNTER — Ambulatory Visit (HOSPITAL_COMMUNITY)
Admission: RE | Admit: 2013-09-07 | Discharge: 2013-09-07 | Disposition: A | Discharge status: Home / Self Care | Payer: 59 | Source: Ambulatory Visit | Attending: Internal Medicine | Admitting: Internal Medicine

## 2013-09-07 DIAGNOSIS — Z1231 Encounter for screening mammogram for malignant neoplasm of breast: Secondary | ICD-10-CM | POA: Insufficient documentation

## 2013-11-02 ENCOUNTER — Ambulatory Visit: Payer: Self-pay | Admitting: Podiatry

## 2013-11-02 ENCOUNTER — Telehealth: Payer: Self-pay | Admitting: Cardiovascular Disease

## 2013-11-02 NOTE — Telephone Encounter (Signed)
[name] says that her weight is going up, but she is unsure of the time span and the specific amount.  She remembers her weight being 196lbs at some point, but unsure of when.  Her weight yesterday was 201.6lbs and today it is 202.2. She does not have any symptoms of overload.  No SOB, no abdominal distention, no edema; she feels good!  She thinks that she might have missed her lasix dose yesterday due to church, but she took it this morning.  I advised to continue to monitor weight and write them down.  She will call back if any symptoms develop or if weight continues to increase.

## 2013-11-02 NOTE — Telephone Encounter (Signed)
Please call-her weight is going up everyday since Sunday.

## 2013-12-28 ENCOUNTER — Other Ambulatory Visit: Payer: Self-pay | Admitting: Cardiovascular Disease

## 2013-12-28 ENCOUNTER — Other Ambulatory Visit: Payer: Self-pay | Admitting: Physician Assistant

## 2013-12-28 NOTE — Telephone Encounter (Signed)
Rx was sent to pharmacy electronically. 

## 2013-12-29 ENCOUNTER — Ambulatory Visit (INDEPENDENT_AMBULATORY_CARE_PROVIDER_SITE_OTHER): Payer: Medicare Other | Admitting: Cardiovascular Disease

## 2013-12-29 ENCOUNTER — Encounter: Payer: Self-pay | Admitting: Cardiovascular Disease

## 2013-12-29 VITALS — BP 132/86 | HR 58 | Ht 62.0 in | Wt 196.0 lb

## 2013-12-29 DIAGNOSIS — I429 Cardiomyopathy, unspecified: Secondary | ICD-10-CM

## 2013-12-29 DIAGNOSIS — I10 Essential (primary) hypertension: Secondary | ICD-10-CM

## 2013-12-29 DIAGNOSIS — I2510 Atherosclerotic heart disease of native coronary artery without angina pectoris: Secondary | ICD-10-CM

## 2013-12-29 DIAGNOSIS — I428 Other cardiomyopathies: Secondary | ICD-10-CM

## 2013-12-29 NOTE — Patient Instructions (Signed)
Your physician wants you to follow-up in: 6 months with Bryan Hager PAC and 1 year with Dr Berry.  You will receive a reminder letter in the mail two months in advance. If you don't receive a letter, please call our office to schedule the follow-up appointment.  

## 2013-12-29 NOTE — Assessment & Plan Note (Signed)
Status post cardiac catheterization by Dr. Norwood Levo 06/06/12 revealing minimal CAD with an EF of 35-40%. She weighs herself daily and is aware of salt restriction.

## 2013-12-29 NOTE — Assessment & Plan Note (Signed)
Controlled on current medications 

## 2013-12-29 NOTE — Progress Notes (Signed)
12/29/2013 [name]   09/29/1931  [id number]  Primary Physician Alva GarnetSHELTON,KIMBERLY R., MD Primary Cardiologist: Runell GessJonathan J. Branston Halsted MD Roseanne RenoFACP,FACC,FAHA, FSCAI   HPI:  The patient returns today for followup. She is a 80 year old moderately overweight widowed [place] female, mother of [id number], grandmother to 7612 grandchildren, who is accompanied by one of her sister's today. I saw her 6 months ago. She has a history of probable nonischemic cardiomyopathy with moderate LV dysfunction by 2D echocardiogram, last checked June 03, 2012, with an EF of 35% to 40%. She was catheterized by Dr. Daphene Jaegerom Kelly, June 06, 2012, revealing a similar EF with no significant CAD, but this was in the setting of sepsis, requiring intubation, and congestive heart failure, responding to antibiotics and diuresis. Her other problems include hypertension, hyperlipidemia, and diabetes. She had a negative Myoview in July of 2009. She is otherwise asymptomatic. She saw Huey BienenstockBrian Hager PAC back in the office 6 months ago. She has been doing well since.     Current Outpatient Prescriptions  Medication Sig Dispense Refill  . acidophilus (RISAQUAD) CAPS Take 1 capsule by mouth daily.      Marland Kitchen. ALPRAZolam (XANAX) 0.25 MG tablet Take 0.25 mg by mouth daily as needed for anxiety. For anxiety      . aspirin 81 MG chewable tablet Chew 81 mg by mouth daily.        . calcium-vitamin D (OSCAL WITH D) 500-200 MG-UNIT per tablet Take 1 tablet by mouth daily.      . Coenzyme Q10 (CO Q-10) 200 MG CAPS Take 200 mg by mouth daily.      . furosemide (LASIX) 40 MG tablet TAKE ONE (1) TABLET EACH DAY  30 tablet  11  . hydrALAZINE (APRESOLINE) 25 MG tablet TAKE 1 TABLET BY MOUTH TWICE A DAY.  60 tablet  5  . KLOR-CON M20 20 MEQ tablet TAKE 1 TABLET (20 MEQ TOTAL) BY MOUTH DAILY.  30 tablet  5  . losartan (COZAAR) 100 MG tablet Take 100 mg by mouth daily.       . metFORMIN (GLUCOPHAGE) 1000 MG tablet Take 500 mg by mouth 2 (two) times daily with a  meal.       . metoprolol (LOPRESSOR) 100 MG tablet Take 100 mg by mouth 2 (two) times daily.      Marland Kitchen. omega-3 acid ethyl esters (LOVAZA) 1 G capsule Take 2 g by mouth daily.      Marland Kitchen. omeprazole (PRILOSEC) 40 MG capsule Take 40 mg by mouth 2 (two) times daily.       . simvastatin (ZOCOR) 20 MG tablet Take 20 mg by mouth every evening.      . traMADol (ULTRAM) 50 MG tablet Take 50 mg by mouth every 6 (six) hours as needed. Maximum dose= 8 tablets per day. For pain      . vitamin C (ASCORBIC ACID) 500 MG tablet Take 500 mg by mouth daily.      . Zinc 50 MG TABS Take 1 tablet by mouth daily.       No current facility-administered medications for this visit.   Facility-Administered Medications Ordered in Other Visits  Medication Dose Route Frequency Provider Last Rate Last Dose  . ceFAZolin (ANCEF) IVPB 1 g/50 mL premix  1 g Intravenous 60 min Pre-Op [employer]Claire Sanger, DO      . HYDROmorphone (DILAUDID) injection 0.25-0.5 mg  0.25-0.5 mg Intravenous Q5 min PRN Achille RichAdam Hodierne, MD      . ondansetron (  ZOFRAN) injection 4 mg  4 mg Intravenous Q6H PRN Achille Rich, MD        Allergies  Allergen Reactions  . Lisinopril     cough    History   Social History  . Marital Status: Widowed    Spouse Name: N/A    Number of Children: 6  . Years of Education: N/A   Occupational History  . Not on file.   Social History Main Topics  . Smoking status: Never Smoker   . Smokeless tobacco: Never Used  . Alcohol Use: No     Comment: 11/30/11 "used to drink; not much; don't drink anymore"  . Drug Use: No  . Sexual Activity: No   Other Topics Concern  . Not on file   Social History Narrative  . No narrative on file     Review of Systems: General: negative for chills, fever, night sweats or weight changes.  Cardiovascular: negative for chest pain, dyspnea on exertion, edema, orthopnea, palpitations, paroxysmal nocturnal dyspnea or shortness of breath Dermatological: negative for rash Respiratory:  negative for cough or wheezing Urologic: negative for hematuria Abdominal: negative for nausea, vomiting, diarrhea, bright red blood per rectum, melena, or hematemesis Neurologic: negative for visual changes, syncope, or dizziness All other systems reviewed and are otherwise negative except as noted above.    Blood pressure 132/86, pulse 58, height 5\' 2"  (1.575 m), weight 196 lb (88.905 kg).  General appearance: alert and no distress Neck: no adenopathy, no carotid bruit, no JVD, supple, symmetrical, trachea midline and thyroid not enlarged, symmetric, no tenderness/mass/nodules Lungs: clear to auscultation bilaterally Heart: regular rate and rhythm, S1, S2 normal, no murmur, click, rub or gallop Extremities: extremities normal, atraumatic, no cyanosis or edema  EKG sinus bradycardia at 58 without ST or T wave changes  ASSESSMENT AND PLAN:   HTN (hypertension) Controlled on current medications  Cardiomyopathy, EF 35-40% Status post cardiac catheterization by Dr. Norwood Levo 06/06/12 revealing minimal CAD with an EF of 35-40%. She weighs herself daily and is aware of salt restriction.      Runell Gess MD FACP,FACC,FAHA, [facility] 12/29/2013 4:05 PM

## 2014-01-11 ENCOUNTER — Ambulatory Visit: Payer: Self-pay | Admitting: Podiatry

## 2014-01-18 ENCOUNTER — Ambulatory Visit: Payer: Self-pay | Admitting: Podiatry

## 2014-01-25 ENCOUNTER — Ambulatory Visit: Payer: Self-pay | Admitting: Podiatry

## 2014-02-01 ENCOUNTER — Ambulatory Visit (INDEPENDENT_AMBULATORY_CARE_PROVIDER_SITE_OTHER): Payer: Medicare Other | Admitting: Podiatry

## 2014-02-01 ENCOUNTER — Encounter: Payer: Self-pay | Admitting: Podiatry

## 2014-02-01 VITALS — BP 166/68 | HR 62 | Resp 18

## 2014-02-01 DIAGNOSIS — M79609 Pain in unspecified limb: Secondary | ICD-10-CM

## 2014-02-01 DIAGNOSIS — B351 Tinea unguium: Secondary | ICD-10-CM

## 2014-02-01 NOTE — Patient Instructions (Signed)
Diabetes and Foot Care Diabetes may cause you to have problems because of poor blood supply (circulation) to your feet and legs. This may cause the skin on your feet to become thinner, break easier, and heal more slowly. Your skin may become dry, and the skin may peel and crack. You may also have nerve damage in your legs and feet causing decreased feeling in them. You may not notice minor injuries to your feet that could lead to infections or more serious problems. Taking care of your feet is one of the most important things you can do for yourself.  HOME CARE INSTRUCTIONS  Wear shoes at all times, even in the house. Do not go barefoot. Bare feet are easily injured.  Check your feet daily for blisters, cuts, and redness. If you cannot see the bottom of your feet, use a mirror or ask someone for help.  Wash your feet with warm water (do not use hot water) and mild soap. Then pat your feet and the areas between your toes until they are completely dry. Do not soak your feet as this can dry your skin.  Apply a moisturizing lotion or petroleum jelly (that does not contain alcohol and is unscented) to the skin on your feet and to dry, brittle toenails. Do not apply lotion between your toes.  Trim your toenails straight across. Do not dig under them or around the cuticle. File the edges of your nails with an emery board or nail file.  Do not cut corns or calluses or try to remove them with medicine.  Wear clean socks or stockings every day. Make sure they are not too tight. Do not wear knee-high stockings since they may decrease blood flow to your legs.  Wear shoes that fit properly and have enough cushioning. To break in new shoes, wear them for just a few hours a day. This prevents you from injuring your feet. Always look in your shoes before you put them on to be sure there are no objects inside.  Do not cross your legs. This may decrease the blood flow to your feet.  If you find a minor scrape,  cut, or break in the skin on your feet, keep it and the skin around it clean and dry. These areas may be cleansed with mild soap and water. Do not cleanse the area with peroxide, alcohol, or iodine.  When you remove an adhesive bandage, be sure not to damage the skin around it.  If you have a wound, look at it several times a day to make sure it is healing.  Do not use heating pads or hot water bottles. They may burn your skin. If you have lost feeling in your feet or legs, you may not know it is happening until it is too late.  Make sure your health care provider performs a complete foot exam at least annually or more often if you have foot problems. Report any cuts, sores, or bruises to your health care provider immediately. SEEK MEDICAL CARE IF:   You have an injury that is not healing.  You have cuts or breaks in the skin.  You have an ingrown nail.  You notice redness on your legs or feet.  You feel burning or tingling in your legs or feet.  You have pain or cramps in your legs and feet.  Your legs or feet are numb.  Your feet always feel cold. SEEK IMMEDIATE MEDICAL CARE IF:   There is increasing redness,   swelling, or pain in or around a wound.  There is a red line that goes up your leg.  Pus is coming from a wound.  You develop a fever or as directed by your health care provider.  You notice a bad smell coming from an ulcer or wound. Document Released: 11/16/2000 Document Revised: 07/22/2013 Document Reviewed: 04/28/2013 Exit[employer] Patient Information 2014 [employer].  

## 2014-02-02 NOTE — Progress Notes (Signed)
Patient ID: [name], female   DOB: [birth year], 80 y.o.   MRN: [id number]  Subjective: This patient presents for ongoing debridement of painful mycotic toenails. The last visit for this service was 07/13/2013. She has been a patient in this practice since 2005.  Objective: Elongated, hypertrophic, discolored toenails with palpable tenderness in all toenails.  Assessment: Symptomatic onychomycoses x10  Plan: Nails x10 are debrided back without any bleeding. Reappoint at patient's request or at three-month intervals.  General Information about diabetic footcare provided patient today.

## 2014-02-03 ENCOUNTER — Other Ambulatory Visit: Payer: Self-pay

## 2014-02-03 MED ORDER — FUROSEMIDE 40 MG PO TABS: ORAL_TABLET | ORAL | Status: DC

## 2014-02-03 MED ORDER — HYDRALAZINE HCL 25 MG PO TABS: ORAL_TABLET | ORAL | Status: DC

## 2014-02-03 MED ORDER — POTASSIUM CHLORIDE CRYS ER 20 MEQ PO TBCR: EXTENDED_RELEASE_TABLET | ORAL | Status: DC

## 2014-02-03 NOTE — Telephone Encounter (Signed)
Rx was sent to pharmacy electronically. 

## 2014-05-04 ENCOUNTER — Other Ambulatory Visit: Payer: Self-pay | Admitting: *Deleted

## 2014-05-04 ENCOUNTER — Encounter: Payer: Self-pay | Admitting: Physician Assistant

## 2014-05-04 ENCOUNTER — Ambulatory Visit (INDEPENDENT_AMBULATORY_CARE_PROVIDER_SITE_OTHER): Payer: 59 | Admitting: Physician Assistant

## 2014-05-04 VITALS — BP 132/82 | HR 64 | Ht 62.0 in | Wt 208.0 lb

## 2014-05-04 DIAGNOSIS — I10 Essential (primary) hypertension: Secondary | ICD-10-CM

## 2014-05-04 DIAGNOSIS — I340 Nonrheumatic mitral (valve) insufficiency: Secondary | ICD-10-CM

## 2014-05-04 DIAGNOSIS — IMO0001 Reserved for inherently not codable concepts without codable children: Secondary | ICD-10-CM

## 2014-05-04 DIAGNOSIS — I429 Cardiomyopathy, unspecified: Secondary | ICD-10-CM

## 2014-05-04 DIAGNOSIS — I428 Other cardiomyopathies: Secondary | ICD-10-CM

## 2014-05-04 DIAGNOSIS — I059 Rheumatic mitral valve disease, unspecified: Secondary | ICD-10-CM

## 2014-05-04 DIAGNOSIS — E669 Obesity, unspecified: Secondary | ICD-10-CM

## 2014-05-04 MED ORDER — METOPROLOL TARTRATE 100 MG PO TABS: 100.0000 mg | ORAL_TABLET | Freq: Two times a day (BID) | ORAL | Status: DC

## 2014-05-04 MED ORDER — LOSARTAN POTASSIUM 100 MG PO TABS: 100.0000 mg | ORAL_TABLET | Freq: Every day | ORAL | Status: DC

## 2014-05-04 NOTE — Progress Notes (Signed)
Date:  05/04/2014   ID:  [name], DOB 1931-02-03, MRN [id number]  PCP:  [name]., MD  Primary Cardiologist:  Allyson Sabal    History of Present Illness: [name] is a 80 y.o. female The patient returns today for followup. She is a 80 year old moderately overweight widowed [place] American female, mother of 13, grandmother to 41 grandchildren, who is accompanied by one of her sister's today. I saw her 6 months ago. She has a history of probable nonischemic cardiomyopathy with moderate LV dysfunction by 2D echocardiogram, last checked June 03, 2012, with an EF of 35% to 40%. She was catheterized by Dr. Daphene Jaeger, June 06, 2012, revealing a similar EF with no significant CAD, but this was in the setting of sepsis, requiring intubation, and congestive heart failure, responding to antibiotics and diuresis. Her other problems include hypertension, hyperlipidemia, and diabetes. She had a negative Myoview in July of 2009.   Presents for 6 month evaluation reports no complaints with the exception of some ulcerations on both lower extremities which are being treated by her primary care provider. She reports that she had scratched her legs and then developed the ulcerations.  She does report some knee pain but is not interested in seeing an orthopedic provider.  The patient currently denies nausea, vomiting, fever, chest pain, shortness of breath, orthopnea, dizziness, PND, cough, congestion, abdominal pain, hematochezia, melena, lower extremity edema.  Wt Readings from Last 3 Encounters:  05/04/14 208 lb (94.348 kg)  12/29/13 196 lb (88.905 kg)  06/15/13 196 lb 12.8 oz (89.268 kg)     Past Medical History  Diagnosis Date  . Diabetes mellitus   . Hypertension   . Hyperlipemia   . Phlebitis 11/30/11    RLE  . Skin ulcer(s) 11/30/11    BLE; draining  . GERD (gastroesophageal reflux disease)   . Arthritis   . Anxiety   . Peripheral vascular disease   . Neuromuscular disorder       numbness feet  . Seizure, convulsive   . Cardiomyopathy, EF 35-40% 06/05/2012  . Diastolic dysfunction, left ventricle, grade 2 by echo 06/04/12 06/05/2012  . TR (tricuspid regurgitation), mod 06/05/2012  . Acute on chronic systolic and diastolic heart failure, NYHA class 2 06/13/2012  . H/O echocardiogram 06/03/2012    EF 35-40%; systolic function moderately reduced; grade 2 diastolic dysfunction; mild AV regurg.; mod MV regurg.; LA severly dilated; mild-mod tricuspid valve regurg (acute care setting)  . History of nuclear stress test 06/29/2008    normal pattern of perfusion; negative for ischemia; low risk scan    Current Outpatient Prescriptions  Medication Sig Dispense Refill  . acidophilus (RISAQUAD) CAPS Take 1 capsule by mouth daily.      Marland Kitchen ALPRAZolam (XANAX) 0.25 MG tablet Take 0.25 mg by mouth daily as needed for anxiety. For anxiety      . aspirin 81 MG chewable tablet Chew 81 mg by mouth daily.        . calcium-vitamin D (OSCAL WITH D) 500-200 MG-UNIT per tablet Take 1 tablet by mouth daily.      . Coenzyme Q10 (CO Q-10) 200 MG CAPS Take 200 mg by mouth daily.      . furosemide (LASIX) 40 MG tablet TAKE ONE (1) TABLET EACH DAY  90 tablet  3  . hydrALAZINE (APRESOLINE) 25 MG tablet TAKE 1 TABLET BY MOUTH TWICE A DAY.  180 tablet  3  . metFORMIN (GLUCOPHAGE) 1000 MG tablet Take 500 mg  by mouth 2 (two) times daily with a meal.       . omega-3 acid ethyl esters (LOVAZA) 1 G capsule Take 2 g by mouth daily.      Marland Kitchen omeprazole (PRILOSEC) 40 MG capsule Take 40 mg by mouth 2 (two) times daily.       . potassium chloride SA (KLOR-CON M20) 20 MEQ tablet TAKE 1 TABLET (20 MEQ TOTAL) BY MOUTH DAILY.  90 tablet  3  . simvastatin (ZOCOR) 20 MG tablet Take 20 mg by mouth every evening.      . traMADol (ULTRAM) 50 MG tablet Take 50 mg by mouth every 6 (six) hours as needed. Maximum dose= 8 tablets per day. For pain      . vitamin C (ASCORBIC ACID) 500 MG tablet Take 500 mg by mouth daily.      . Zinc  50 MG TABS Take 1 tablet by mouth daily.      Marland Kitchen losartan (COZAAR) 100 MG tablet Take 1 tablet (100 mg total) by mouth daily.  90 tablet  3  . metoprolol (LOPRESSOR) 100 MG tablet Take 1 tablet (100 mg total) by mouth 2 (two) times daily.  90 tablet  3   No current facility-administered medications for this visit.   Facility-Administered Medications Ordered in Other Visits  Medication Dose Route Frequency Provider Last Rate Last Dose  . ceFAZolin (ANCEF) IVPB 1 g/50 mL premix  1 g Intravenous 60 min Pre-Op [employer], DO      . HYDROmorphone (DILAUDID) injection 0.25-0.5 mg  0.25-0.5 mg Intravenous Q5 min PRN Achille Rich, MD      . ondansetron (ZOFRAN) injection 4 mg  4 mg Intravenous Q6H PRN Achille Rich, MD        Allergies:    Allergies  Allergen Reactions  . Lisinopril     cough    Social History:  The patient  reports that she has never smoked. She has never used smokeless tobacco. She reports that she does not drink alcohol or use illicit drugs.   Family history:   Family History  Problem Relation Age of Onset  . Heart failure Mother   . Cancer Mother   . Cancer Father   . Diabetes Brother   . Diabetes Sister   . Cancer Sister     ROS:  Please see the history of present illness.  All other systems reviewed and negative.   PHYSICAL EXAM: VS:  BP 132/82  Pulse 64  Ht 5\' 2"  (1.575 m)  Wt 208 lb (94.348 kg)  BMI 38.03 kg/m2 Obese, well developed, in no acute distress HEENT: Pupils are equal round react to light accommodation extraocular movements are intact.  Neck: no JVDNo cervical lymphadenopathy. Cardiac: Regular rate and rhythm without murmurs rubs or gallops. Lungs:  clear to auscultation bilaterally, no wheezing, rhonchi or rales Abd: soft, nontender, positive bowel sounds all quadrants, no hepatosplenomegaly Ext: no lower extremity edema.  1+ radial and dorsalis pedis pulses. Skin: warm and dryShe is to full-thickness ulcerations on her left lower  extremity and one on the right. There are dry without any surrounding erythema. Neuro:  Grossly normal  EKG: Normal sinus rhythm rate of 64 beats per minute inferior T wave inversion  ASSESSMENT AND PLAN:  Problem List Items Addressed This Visit   HTN (hypertension) - Primary (Chronic)     Blood pressure is well-controlled at this time    Relevant Orders      EKG 12-Lead   Cardiomyopathy,  EF 35-40%     Patient appears well compensated with no signs of heart failure    Mitral regurgitation, mod by echo 06/03/12   Obesity, Class II, BMI 35-39.9, with comorbidity     Discussed dietary modification and exercises.

## 2014-05-04 NOTE — Assessment & Plan Note (Signed)
Patient has no complaints of angina.  She is on appropriate medications.

## 2014-05-04 NOTE — Assessment & Plan Note (Signed)
Blood pressure is well-controlled at this time. 

## 2014-05-04 NOTE — Patient Instructions (Signed)
1.  Keep up the dietary changes.  2.  Exercise as I described.   3.  Follow up with Dr. Allyson Sabal

## 2014-05-04 NOTE — Assessment & Plan Note (Signed)
Patient appears well compensated with no signs of heart failure

## 2014-05-04 NOTE — Assessment & Plan Note (Signed)
Discussed dietary modification and exercises.

## 2014-05-11 NOTE — Telephone Encounter (Signed)
Medication ordered

## 2014-06-07 ENCOUNTER — Telehealth: Payer: Self-pay | Admitting: Cardiovascular Disease

## 2014-06-07 NOTE — Telephone Encounter (Signed)
Called patient to let her know that the Rx was filled in March for #90 and 3 refills and it is ready pharmacy. Patient voiced understanding

## 2014-06-07 NOTE — Telephone Encounter (Signed)
Needs her potassium refilled.  Pharmacy is [facility] on [address].  Please call

## 2014-07-08 ENCOUNTER — Other Ambulatory Visit: Payer: Self-pay | Admitting: Internal Medicine

## 2014-07-08 DIAGNOSIS — L97929 Non-pressure chronic ulcer of unspecified part of left lower leg with unspecified severity: Principal | ICD-10-CM

## 2014-07-08 DIAGNOSIS — L97919 Non-pressure chronic ulcer of unspecified part of right lower leg with unspecified severity: Secondary | ICD-10-CM

## 2014-07-12 ENCOUNTER — Telehealth: Payer: Self-pay | Admitting: Cardiovascular Disease

## 2014-07-12 NOTE — Telephone Encounter (Signed)
Message sent to Dr.Berry. 

## 2014-07-12 NOTE — Telephone Encounter (Signed)
Wanted to let Dr. Allyson Sabal know that [name] was on vacation at the beach and did not take her Lasix.  She had a 6.6 pound weight gain in 7 days.  She has started taking her Lasix again and does not have any symptoms of CHF.  If you have any further instructions, please call the patient directly.

## 2014-07-15 MED ORDER — HYDRALAZINE HCL 25 MG PO TABS: ORAL_TABLET | ORAL | Status: DC

## 2014-07-15 MED ORDER — FUROSEMIDE 40 MG PO TABS: ORAL_TABLET | ORAL | Status: DC

## 2014-07-19 ENCOUNTER — Encounter (HOSPITAL_BASED_OUTPATIENT_CLINIC_OR_DEPARTMENT_OTHER): Payer: Medicare Other | Attending: Plastic Surgery

## 2014-07-19 ENCOUNTER — Other Ambulatory Visit: Payer: 59

## 2014-07-19 DIAGNOSIS — K219 Gastro-esophageal reflux disease without esophagitis: Secondary | ICD-10-CM | POA: Insufficient documentation

## 2014-07-19 DIAGNOSIS — I509 Heart failure, unspecified: Secondary | ICD-10-CM | POA: Insufficient documentation

## 2014-07-19 DIAGNOSIS — E119 Type 2 diabetes mellitus without complications: Secondary | ICD-10-CM | POA: Insufficient documentation

## 2014-07-19 DIAGNOSIS — I10 Essential (primary) hypertension: Secondary | ICD-10-CM | POA: Diagnosis not present

## 2014-07-19 DIAGNOSIS — I739 Peripheral vascular disease, unspecified: Secondary | ICD-10-CM | POA: Diagnosis not present

## 2014-07-19 DIAGNOSIS — E785 Hyperlipidemia, unspecified: Secondary | ICD-10-CM | POA: Diagnosis not present

## 2014-07-19 DIAGNOSIS — G609 Hereditary and idiopathic neuropathy, unspecified: Secondary | ICD-10-CM | POA: Diagnosis not present

## 2014-07-19 DIAGNOSIS — L97909 Non-pressure chronic ulcer of unspecified part of unspecified lower leg with unspecified severity: Secondary | ICD-10-CM | POA: Insufficient documentation

## 2014-07-19 DIAGNOSIS — I872 Venous insufficiency (chronic) (peripheral): Secondary | ICD-10-CM | POA: Insufficient documentation

## 2014-07-19 DIAGNOSIS — Z79899 Other long term (current) drug therapy: Secondary | ICD-10-CM | POA: Insufficient documentation

## 2014-07-19 DIAGNOSIS — Z7982 Long term (current) use of aspirin: Secondary | ICD-10-CM | POA: Diagnosis not present

## 2014-07-19 NOTE — Progress Notes (Signed)
[facility]  NAMEADAMARIS, [name]              ACCOUNT NO.:  [id number]  MEDICAL RECORD NO.:  [id number]      DATE OF BIRTH:  Sep 24, 1931  PHYSICIAN:  Wayland Denis, DO       VISIT DATE:  07/19/2014                                  OFFICE VISIT   The patient is an 80 year old female who is here for evaluation on her bilateral lower extremity ulcers.  She had very large ulcers on the legs in the past and she underwent debridement, ACell placement back and then the skin graft and responded very well that was approximately 2 years ago.  Recently, she scratched her leg and opened up several more areas on both legs.  She has just been doing some local dressings at home.  PAST MEDICAL HISTORY:  Positive for, 1. Diabetes. 2. Hypertension. 3. Hyperlipidemia. 4. Phlebitis. 5. Gastroesophageal reflux. 6. Arthritis. 7. Anxiety. 8. Peripheral vascular disease. 9. Peripheral neuropathy. 10.Seizures. 11.Cardiomyopathy. 12.Congestive heart failure. 13.Tricuspid regurgitation.  SURGERIES:  Skin graft to the left leg.  ALLERGIES:  LISINOPRIL.  MEDICATIONS:  Metoprolol, hydralazine, simvastatin, metformin, losartan, omeprazole, furosemide, aspirin, Klor-Con, Os-Cal, fish oil, vitamin C, CoQ 10, zinc, probiotic, and calcium.  SOCIAL HISTORY:  Lives at home and has good family support.  She is not a smoker.  REVIEW OF SYSTEMS:  Negative.  PHYSICAL EXAMINATION:  GENERAL:  She is alert, oriented, cooperative, not in any acute distress.  She is very pleasant and remembers being here in the past. HEENT:  Her pupils are equal. CHEST:  Her breathing is unlabored. HEART:  Her heart rate is regular. ABDOMEN:  Her abdomen is soft and nontender but large. EXTREMITIES:  The ooze on the lower extremities.  She has good pulses but we are getting a vascular workup that was ordered previously.  She has 2 small areas on the right and larger one on the left.  She does not look  infected but the one on the left involved the area where the skin graft was previously.  So, the plan will be Santyl today.  We will check prealbumin and hemoglobin A1c, and we will plan to take her to the OR for debridement, ACell placement, and we may not be able to use the [facility] because of the surrounding thin skin.     Wayland Denis, DO     CS/MEDQ  D:  07/19/2014  T:  07/19/2014  Job:  [id number]

## 2014-07-22 ENCOUNTER — Ambulatory Visit
Admission: RE | Admit: 2014-07-22 | Discharge: 2014-07-22 | Disposition: A | Discharge status: Home / Self Care | Payer: 59 | Source: Ambulatory Visit | Attending: Internal Medicine | Admitting: Internal Medicine

## 2014-07-22 DIAGNOSIS — L97919 Non-pressure chronic ulcer of unspecified part of right lower leg with unspecified severity: Secondary | ICD-10-CM

## 2014-07-22 DIAGNOSIS — L97929 Non-pressure chronic ulcer of unspecified part of left lower leg with unspecified severity: Principal | ICD-10-CM

## 2014-07-23 ENCOUNTER — Encounter (HOSPITAL_BASED_OUTPATIENT_CLINIC_OR_DEPARTMENT_OTHER): Payer: Self-pay | Admitting: *Deleted

## 2014-07-23 NOTE — Progress Notes (Signed)
07/23/14 1609  OBSTRUCTIVE SLEEP APNEA  Have you ever been diagnosed with sleep apnea through a sleep study? No  Do you snore loudly (loud enough to be heard through closed doors)?  0  Do you often feel tired, fatigued, or sleepy during the daytime? 0  Has anyone observed you stop breathing during your sleep? 0  Do you have, or are you being treated for high blood pressure? 1  BMI more than 35 kg/m2? 1  Age over 78 years old? 1  Neck circumference greater than 40 cm/16 inches? 1  Gender: 0  Obstructive Sleep Apnea Score 4  Score 4 or greater  Results sent to PCP

## 2014-07-23 NOTE — Progress Notes (Signed)
Pt had wound debrid x2 2013-hx chf-stable-no CAD Needs istat

## 2014-07-26 ENCOUNTER — Other Ambulatory Visit: Payer: Self-pay | Admitting: Plastic Surgery

## 2014-07-26 DIAGNOSIS — L97902 Non-pressure chronic ulcer of unspecified part of unspecified lower leg with fat layer exposed: Secondary | ICD-10-CM

## 2014-07-28 ENCOUNTER — Encounter (HOSPITAL_BASED_OUTPATIENT_CLINIC_OR_DEPARTMENT_OTHER): Payer: Medicare Other | Admitting: Anesthesiology

## 2014-07-28 ENCOUNTER — Ambulatory Visit (HOSPITAL_BASED_OUTPATIENT_CLINIC_OR_DEPARTMENT_OTHER)
Admission: RE | Admit: 2014-07-28 | Discharge: 2014-07-28 | Disposition: A | Discharge status: Home / Self Care | Payer: Medicare Other | Source: Ambulatory Visit | Attending: Plastic Surgery | Admitting: Plastic Surgery

## 2014-07-28 ENCOUNTER — Encounter (HOSPITAL_BASED_OUTPATIENT_CLINIC_OR_DEPARTMENT_OTHER)
Admission: RE | Disposition: A | Discharge status: Home / Self Care | Payer: Self-pay | Source: Ambulatory Visit | Attending: Plastic Surgery

## 2014-07-28 ENCOUNTER — Encounter (HOSPITAL_BASED_OUTPATIENT_CLINIC_OR_DEPARTMENT_OTHER): Payer: Self-pay | Admitting: *Deleted

## 2014-07-28 ENCOUNTER — Ambulatory Visit (HOSPITAL_BASED_OUTPATIENT_CLINIC_OR_DEPARTMENT_OTHER): Payer: Medicare Other | Admitting: Anesthesiology

## 2014-07-28 DIAGNOSIS — I2510 Atherosclerotic heart disease of native coronary artery without angina pectoris: Secondary | ICD-10-CM | POA: Diagnosis not present

## 2014-07-28 DIAGNOSIS — E119 Type 2 diabetes mellitus without complications: Secondary | ICD-10-CM | POA: Insufficient documentation

## 2014-07-28 DIAGNOSIS — M129 Arthropathy, unspecified: Secondary | ICD-10-CM | POA: Insufficient documentation

## 2014-07-28 DIAGNOSIS — I10 Essential (primary) hypertension: Secondary | ICD-10-CM | POA: Diagnosis not present

## 2014-07-28 DIAGNOSIS — S91009A Unspecified open wound, unspecified ankle, initial encounter: Principal | ICD-10-CM

## 2014-07-28 DIAGNOSIS — E785 Hyperlipidemia, unspecified: Secondary | ICD-10-CM | POA: Diagnosis not present

## 2014-07-28 DIAGNOSIS — X58XXXA Exposure to other specified factors, initial encounter: Secondary | ICD-10-CM | POA: Diagnosis not present

## 2014-07-28 DIAGNOSIS — Z79899 Other long term (current) drug therapy: Secondary | ICD-10-CM | POA: Insufficient documentation

## 2014-07-28 DIAGNOSIS — L97902 Non-pressure chronic ulcer of unspecified part of unspecified lower leg with fat layer exposed: Secondary | ICD-10-CM

## 2014-07-28 DIAGNOSIS — Z90710 Acquired absence of both cervix and uterus: Secondary | ICD-10-CM | POA: Insufficient documentation

## 2014-07-28 DIAGNOSIS — K219 Gastro-esophageal reflux disease without esophagitis: Secondary | ICD-10-CM | POA: Diagnosis not present

## 2014-07-28 DIAGNOSIS — F411 Generalized anxiety disorder: Secondary | ICD-10-CM | POA: Insufficient documentation

## 2014-07-28 DIAGNOSIS — S81009A Unspecified open wound, unspecified knee, initial encounter: Secondary | ICD-10-CM | POA: Diagnosis not present

## 2014-07-28 DIAGNOSIS — I739 Peripheral vascular disease, unspecified: Secondary | ICD-10-CM | POA: Insufficient documentation

## 2014-07-28 DIAGNOSIS — I252 Old myocardial infarction: Secondary | ICD-10-CM | POA: Insufficient documentation

## 2014-07-28 DIAGNOSIS — L97909 Non-pressure chronic ulcer of unspecified part of unspecified lower leg with unspecified severity: Secondary | ICD-10-CM | POA: Diagnosis not present

## 2014-07-28 DIAGNOSIS — S81809A Unspecified open wound, unspecified lower leg, initial encounter: Principal | ICD-10-CM

## 2014-07-28 HISTORY — PX: I & D EXTREMITY: SHX5045

## 2014-07-28 HISTORY — PX: APPLICATION OF A-CELL OF EXTREMITY: SHX6303

## 2014-07-28 HISTORY — DX: Complete loss of teeth, unspecified cause, unspecified class: Z97.2

## 2014-07-28 HISTORY — DX: Complete loss of teeth, unspecified cause, unspecified class: K08.109

## 2014-07-28 HISTORY — DX: Presence of spectacles and contact lenses: Z97.3

## 2014-07-28 LAB — POCT I-STAT, CHEM 8
BUN: 23 mg/dL (ref 6–23)
Calcium, Ion: 1.24 mmol/L (ref 1.13–1.30)
Chloride: 103 mEq/L (ref 96–112)
Creatinine, Ser: 1.1 mg/dL (ref 0.50–1.10)
GLUCOSE: 108 mg/dL — AB (ref 70–99)
HEMATOCRIT: 36 % (ref 36.0–46.0)
HEMOGLOBIN: 12.2 g/dL (ref 12.0–15.0)
POTASSIUM: 4.6 meq/L (ref 3.7–5.3)
Sodium: 139 mEq/L (ref 137–147)
TCO2: 25 mmol/L (ref 0–100)

## 2014-07-28 LAB — GLUCOSE, CAPILLARY: GLUCOSE-CAPILLARY: 93 mg/dL (ref 70–99)

## 2014-07-28 SURGERY — IRRIGATION AND DEBRIDEMENT EXTREMITY
Anesthesia: General | Site: Leg Lower | Laterality: Bilateral

## 2014-07-28 MED ORDER — LIDOCAINE HCL (CARDIAC) 20 MG/ML IV SOLN
INTRAVENOUS | Status: DC | PRN
  Administered 2014-07-28: 50 mg via INTRAVENOUS

## 2014-07-28 MED ORDER — MIDAZOLAM HCL 2 MG/2ML IJ SOLN: 1.0000 mg | INTRAMUSCULAR | Status: DC | PRN

## 2014-07-28 MED ORDER — FENTANYL CITRATE 0.05 MG/ML IJ SOLN
25.0000 ug | INTRAMUSCULAR | Status: DC | PRN
  Administered 2014-07-28 (×2): 25 ug via INTRAVENOUS
  Administered 2014-07-28: 50 ug via INTRAVENOUS

## 2014-07-28 MED ORDER — FENTANYL CITRATE 0.05 MG/ML IJ SOLN
INTRAMUSCULAR | Status: AC
  Filled 2014-07-28: qty 2

## 2014-07-28 MED ORDER — ONDANSETRON HCL 4 MG/2ML IJ SOLN: 4.0000 mg | Freq: Once | INTRAMUSCULAR | Status: DC | PRN

## 2014-07-28 MED ORDER — FENTANYL CITRATE 0.05 MG/ML IJ SOLN: 50.0000 ug | INTRAMUSCULAR | Status: DC | PRN

## 2014-07-28 MED ORDER — LACTATED RINGERS IV SOLN
INTRAVENOUS | Status: DC
  Administered 2014-07-28: 10 mL/h via INTRAVENOUS
  Administered 2014-07-28: 07:00:00 via INTRAVENOUS

## 2014-07-28 MED ORDER — PROPOFOL 10 MG/ML IV BOLUS
INTRAVENOUS | Status: DC | PRN
  Administered 2014-07-28: 120 mg via INTRAVENOUS

## 2014-07-28 MED ORDER — DEXAMETHASONE SODIUM PHOSPHATE 4 MG/ML IJ SOLN
INTRAMUSCULAR | Status: DC | PRN
Start: 2014-07-28 — End: 2014-07-28
  Administered 2014-07-28: 4 mg via INTRAVENOUS

## 2014-07-28 MED ORDER — CEFAZOLIN SODIUM-DEXTROSE 2-3 GM-% IV SOLR
INTRAVENOUS | Status: AC
  Filled 2014-07-28: qty 50

## 2014-07-28 MED ORDER — MIDAZOLAM HCL 2 MG/2ML IJ SOLN
INTRAMUSCULAR | Status: AC
  Filled 2014-07-28: qty 2

## 2014-07-28 MED ORDER — FENTANYL CITRATE 0.05 MG/ML IJ SOLN
INTRAMUSCULAR | Status: DC | PRN
  Administered 2014-07-28: 50 ug via INTRAVENOUS

## 2014-07-28 MED ORDER — FENTANYL CITRATE 0.05 MG/ML IJ SOLN
INTRAMUSCULAR | Status: AC
  Filled 2014-07-28: qty 6

## 2014-07-28 MED ORDER — CEFAZOLIN SODIUM-DEXTROSE 2-3 GM-% IV SOLR
2.0000 g | INTRAVENOUS | Status: AC
  Administered 2014-07-28: 2 g via INTRAVENOUS

## 2014-07-28 MED ORDER — ONDANSETRON HCL 4 MG/2ML IJ SOLN
INTRAMUSCULAR | Status: DC | PRN
  Administered 2014-07-28: 4 mg via INTRAVENOUS

## 2014-07-28 SURGICAL SUPPLY — 93 items
APL SKNCLS STERI-STRIP NONHPOA (GAUZE/BANDAGES/DRESSINGS)
BAG DECANTER FOR FLEXI CONT (MISCELLANEOUS) IMPLANT
BANDAGE ELASTIC 3 VELCRO ST LF (GAUZE/BANDAGES/DRESSINGS) IMPLANT
BANDAGE ELASTIC 4 VELCRO ST LF (GAUZE/BANDAGES/DRESSINGS) ×4 IMPLANT
BANDAGE ELASTIC 6 VELCRO ST LF (GAUZE/BANDAGES/DRESSINGS) IMPLANT
BENZOIN TINCTURE PRP APPL 2/3 (GAUZE/BANDAGES/DRESSINGS) IMPLANT
BLADE HEX COATED 2.75 (ELECTRODE) ×2 IMPLANT
BLADE MINI RND TIP GREEN BEAV (BLADE) IMPLANT
BLADE SURG 10 STRL SS (BLADE) IMPLANT
BLADE SURG 15 STRL LF DISP TIS (BLADE) ×1 IMPLANT
BLADE SURG 15 STRL SS (BLADE) ×3
BNDG CMPR 9X4 STRL LF SNTH (GAUZE/BANDAGES/DRESSINGS)
BNDG COHESIVE 1X5 TAN STRL LF (GAUZE/BANDAGES/DRESSINGS) IMPLANT
BNDG COHESIVE 4X5 TAN STRL (GAUZE/BANDAGES/DRESSINGS) IMPLANT
BNDG ESMARK 4X9 LF (GAUZE/BANDAGES/DRESSINGS) IMPLANT
BNDG GAUZE ELAST 4 BULKY (GAUZE/BANDAGES/DRESSINGS) ×4 IMPLANT
CANISTER SUCT 1200ML W/VALVE (MISCELLANEOUS) IMPLANT
CANISTER SUCT 3000ML (MISCELLANEOUS) IMPLANT
CHLORAPREP W/TINT 26ML (MISCELLANEOUS) ×1 IMPLANT
CLOSURE WOUND 1/2 X4 (GAUZE/BANDAGES/DRESSINGS)
CORDS BIPOLAR (ELECTRODE) IMPLANT
COVER MAYO STAND STRL (DRAPES) ×3 IMPLANT
COVER TABLE BACK 60X90 (DRAPES) ×3 IMPLANT
DECANTER SPIKE VIAL GLASS SM (MISCELLANEOUS) IMPLANT
DRAIN PENROSE 1/2X12 LTX STRL (WOUND CARE) IMPLANT
DRAPE EXTREMITY T 121X128X90 (DRAPE) ×4 IMPLANT
DRAPE INCISE IOBAN 66X45 STRL (DRAPES) IMPLANT
DRSG ADAPTIC 3X8 NADH LF (GAUZE/BANDAGES/DRESSINGS) IMPLANT
DRSG EMULSION OIL 3X3 NADH (GAUZE/BANDAGES/DRESSINGS) ×4 IMPLANT
DRSG PAD ABDOMINAL 8X10 ST (GAUZE/BANDAGES/DRESSINGS) IMPLANT
ELECT NDL TIP 2.8 STRL (NEEDLE) IMPLANT
ELECT NEEDLE TIP 2.8 STRL (NEEDLE) IMPLANT
ELECT REM PT RETURN 9FT ADLT (ELECTROSURGICAL) ×3
ELECTRODE REM PT RTRN 9FT ADLT (ELECTROSURGICAL) ×1 IMPLANT
GAUZE SPONGE 4X4 12PLY STRL (GAUZE/BANDAGES/DRESSINGS) ×3 IMPLANT
GAUZE XEROFORM 1X8 LF (GAUZE/BANDAGES/DRESSINGS) IMPLANT
GAUZE XEROFORM 5X9 LF (GAUZE/BANDAGES/DRESSINGS) IMPLANT
GLOVE BIO SURGEON STRL SZ 6.5 (GLOVE) ×4 IMPLANT
GLOVE BIO SURGEONS STRL SZ 6.5 (GLOVE) ×2
GLOVE SURG SS PI 7.0 STRL IVOR (GLOVE) ×2 IMPLANT
GOWN STRL REUS W/ TWL LRG LVL3 (GOWN DISPOSABLE) ×1 IMPLANT
GOWN STRL REUS W/TWL LRG LVL3 (GOWN DISPOSABLE) ×9
HANDPIECE INTERPULSE COAX TIP (DISPOSABLE)
IV NS IRRIG 3000ML ARTHROMATIC (IV SOLUTION) IMPLANT
MANIFOLD NEPTUNE II (INSTRUMENTS) IMPLANT
MATRIX SURGICAL PSM 7X10CM (Tissue) ×2 IMPLANT
MICROMATRIX 500MG (Tissue) ×3 IMPLANT
NDL HYPO 30GX1 BEV (NEEDLE) IMPLANT
NEEDLE 27GAX1X1/2 (NEEDLE) IMPLANT
NEEDLE HYPO 30GX1 BEV (NEEDLE) IMPLANT
NS IRRIG 1000ML POUR BTL (IV SOLUTION) ×3 IMPLANT
PACK BASIN DAY SURGERY FS (CUSTOM PROCEDURE TRAY) ×3 IMPLANT
PADDING CAST ABS 3INX4YD NS (CAST SUPPLIES)
PADDING CAST ABS 4INX4YD NS (CAST SUPPLIES)
PADDING CAST ABS COTTON 3X4 (CAST SUPPLIES) IMPLANT
PADDING CAST ABS COTTON 4X4 ST (CAST SUPPLIES) IMPLANT
PENCIL BUTTON HOLSTER BLD 10FT (ELECTRODE) ×2 IMPLANT
SET HNDPC FAN SPRY TIP SCT (DISPOSABLE) IMPLANT
SHEET MEDIUM DRAPE 40X70 STRL (DRAPES) ×2 IMPLANT
SLEEVE SCD COMPRESS KNEE MED (MISCELLANEOUS) IMPLANT
SOLUTION PARTIC MCRMTRX 500MG (Tissue) IMPLANT
SPLINT PLASTER CAST XFAST 3X15 (CAST SUPPLIES) IMPLANT
SPLINT PLASTER XTRA FASTSET 3X (CAST SUPPLIES)
SPONGE GAUZE 4X4 12PLY STER LF (GAUZE/BANDAGES/DRESSINGS) ×2 IMPLANT
SPONGE LAP 18X18 X RAY DECT (DISPOSABLE) ×3 IMPLANT
SPONGE LAP 4X18 X RAY DECT (DISPOSABLE) IMPLANT
STAPLER VISISTAT 35W (STAPLE) IMPLANT
STOCKINETTE 4X48 STRL (DRAPES) IMPLANT
STOCKINETTE 6  STRL (DRAPES) ×2
STOCKINETTE 6 STRL (DRAPES) ×1 IMPLANT
STOCKINETTE IMPERVIOUS LG (DRAPES) IMPLANT
STRIP CLOSURE SKIN 1/2X4 (GAUZE/BANDAGES/DRESSINGS) IMPLANT
SUCTION FRAZIER TIP 10 FR DISP (SUCTIONS) IMPLANT
SURGILUBE 2OZ TUBE FLIPTOP (MISCELLANEOUS) IMPLANT
SUT ETHILON 3 0 PS 1 (SUTURE) IMPLANT
SUT ETHILON 4 0 P 3 18 (SUTURE) IMPLANT
SUT ETHILON 5 0 PS 2 18 (SUTURE) IMPLANT
SUT PROLENE 3 0 PS 2 (SUTURE) IMPLANT
SUT SILK 3 0 PS 1 (SUTURE) IMPLANT
SUT VIC AB 3-0 FS2 27 (SUTURE) IMPLANT
SUT VIC AB 5-0 P-3 18X BRD (SUTURE) IMPLANT
SUT VIC AB 5-0 P3 18 (SUTURE)
SUT VIC AB 5-0 PS2 18 (SUTURE) ×2 IMPLANT
SYR BULB IRRIGATION 50ML (SYRINGE) IMPLANT
SYR CONTROL 10ML LL (SYRINGE) ×3 IMPLANT
TAPE HYPAFIX 6 X30' (GAUZE/BANDAGES/DRESSINGS)
TAPE HYPAFIX 6X30 (GAUZE/BANDAGES/DRESSINGS) IMPLANT
TOWEL OR 17X24 6PK STRL BLUE (TOWEL DISPOSABLE) ×3 IMPLANT
TRAY DSU PREP LF (CUSTOM PROCEDURE TRAY) ×2 IMPLANT
TUBE CONNECTING 20'X1/4 (TUBING) ×1
TUBE CONNECTING 20X1/4 (TUBING) ×1 IMPLANT
UNDERPAD 30X30 INCONTINENT (UNDERPADS AND DIAPERS) ×3 IMPLANT
YANKAUER SUCT BULB TIP NO VENT (SUCTIONS) ×2 IMPLANT

## 2014-07-28 NOTE — Anesthesia Procedure Notes (Signed)
Procedure Name: LMA Insertion Date/Time: 07/28/2014 8:46 AM Performed by: Zenia Resides D Pre-anesthesia Checklist: Patient identified, Emergency Drugs available, Suction available and Patient being monitored Patient Re-evaluated:Patient Re-evaluated prior to inductionOxygen Delivery Method: Circle System Utilized Preoxygenation: Pre-oxygenation with 100% oxygen Intubation Type: IV induction Ventilation: Mask ventilation without difficulty LMA: LMA inserted LMA Size: 4.0 Number of attempts: 1 Airway Equipment and Method: bite block Placement Confirmation: positive ETCO2 Tube secured with: Tape Dental Injury: Teeth and Oropharynx as per pre-operative assessment

## 2014-07-28 NOTE — Anesthesia Postprocedure Evaluation (Signed)
  Anesthesia Post-op Note  Patient: [name]  Procedure(s) Performed: Procedure(s): BILATERAL IRRIGATION AND DEBRIDEMENT LOWER LEGS/ACELL PLACEMENT (Bilateral) APPLICATION OF A-CELL OF EXTREMITY (Bilateral)  Patient Location: PACU  Anesthesia Type: General   Level of Consciousness: awake, alert  and oriented  Airway and Oxygen Therapy: Patient Spontanous Breathing  Post-op Pain: mild  Post-op Assessment: Post-op Vital signs reviewed  Post-op Vital Signs: Reviewed  Last Vitals:  Filed Vitals:   07/28/14 1030  BP: 171/61  Pulse: 46  Temp:   Resp: 18    Complications: No apparent anesthesia complications

## 2014-07-28 NOTE — Transfer of Care (Signed)
Immediate Anesthesia Transfer of Care Note  Patient: [name]  Procedure(s) Performed: Procedure(s): BILATERAL IRRIGATION AND DEBRIDEMENT LOWER LEGS/ACELL PLACEMENT (Bilateral) APPLICATION OF A-CELL OF EXTREMITY (Bilateral)  Patient Location: PACU  Anesthesia Type:General  Level of Consciousness: awake, alert  and oriented  Airway & Oxygen Therapy: Patient Spontanous Breathing and Patient connected to face mask oxygen  Post-op Assessment: Report given to PACU RN and Post -op Vital signs reviewed and stable  Post vital signs: Reviewed and stable  Complications: No apparent anesthesia complications

## 2014-07-28 NOTE — Brief Op Note (Signed)
07/28/2014  9:09 AM  PATIENT:  [name]  80 y.o. female  PRE-OPERATIVE DIAGNOSIS:  BILATERAL ULCERS LOWER LEGS  POST-OPERATIVE DIAGNOSIS:  * No post-op diagnosis entered *  PROCEDURE:  Procedure(s): BILATERAL IRRIGATION AND DEBRIDEMENT LOWER LEGS/ACELL PLACEMENT (Bilateral) APPLICATION OF A-CELL OF EXTREMITY (Bilateral)  SURGEON:  Surgeon(s) and Role:    * Claire Sanger, DO - Primary  PHYSICIAN ASSISTANT: Shawn Rayburn, PA  ASSISTANTS: none   ANESTHESIA:   general  EBL:  Total I/O In: 500 [I.V.:500] Out: -   BLOOD ADMINISTERED:none  DRAINS: none   LOCAL MEDICATIONS USED:  NONE  SPECIMEN:  No Specimen  DISPOSITION OF SPECIMEN:  N/A  COUNTS:  YES  TOURNIQUET:  * No tourniquets in log *  DICTATION: .Dragon Dictation  PLAN OF CARE: Discharge to home after PACU  PATIENT DISPOSITION:  PACU - hemodynamically stable.   Delay start of Pharmacological VTE agent (>24hrs) due to surgical blood loss or risk of bleeding: no

## 2014-07-28 NOTE — H&P (Signed)
[name] is an 80 y.o. female.   Chief Complaint: bilateral leg ulcers HPI: The patient is an 80 yrs old bf with a history of leg ulcers.  She presents for treatment of new ulcers.  They have been present for weeks.  She is not sure how it happened other than she was scratching.  She has multiple medical conditions but they are treated and controlled.  The ulcers are on the lower legs and in the site of the old graft area.  Past Medical History  Diagnosis Date  . Diabetes mellitus   . Hypertension   . Hyperlipemia   . Phlebitis 11/30/11    RLE  . Skin ulcer(s) 11/30/11    BLE; draining  . GERD (gastroesophageal reflux disease)   . Arthritis   . Anxiety   . Peripheral vascular disease   . Neuromuscular disorder     numbness feet  . Seizure, convulsive   . Cardiomyopathy, EF 35-40% 06/05/2012  . Diastolic dysfunction, left ventricle, grade 2 by echo 06/04/12 06/05/2012  . TR (tricuspid regurgitation), mod 06/05/2012  . Acute on chronic systolic and diastolic heart failure, NYHA class 2 06/13/2012  . H/O echocardiogram 06/03/2012    EF 35-40%; systolic function moderately reduced; grade 2 diastolic dysfunction; mild AV regurg.; mod MV regurg.; LA severly dilated; mild-mod tricuspid valve regurg (acute care setting)  . History of nuclear stress test 06/29/2008    normal pattern of perfusion; negative for ischemia; low risk scan  . Wears glasses   . Full dentures     Past Surgical History  Procedure Laterality Date  . Abdominal hysterectomy    . Cataract extraction w/ intraocular lens  implant, bilateral  1990's  . Blood clot  1990's    RLE; "cut out"  . Eye surgery    . Incision and drainage of wound  01/21/2012    Procedure: IRRIGATION AND DEBRIDEMENT WOUND;  Surgeon: Wayland Denis, DO;  Location: [facility];  Service: Plastics;  Laterality: Bilateral;  I&D bilateral lower extremities and placement of wound vac left lower leg  . Skin split graft  02/18/2012   Procedure: SKIN GRAFT SPLIT THICKNESS;  Surgeon: Wayland Denis, DO;  Location: [facility];  Service: Plastics;  Laterality: Bilateral;  irrigation bilateral lower extremities, ACell placement bilateral lower legs, Wound Vac placement left lower leg  . Skin split graft  03/17/2012    Procedure: SKIN GRAFT SPLIT THICKNESS;  Surgeon: Wayland Denis, DO;  Location: [facility] OR;  Service: Plastics;  Laterality: Left;  LEFT LEG SKIN GRAFT WITH SPLIT THICKNESS   . Application of wound vac  03/17/2012    Procedure: APPLICATION OF WOUND VAC;  Surgeon: Wayland Denis, DO;  Location: [facility] OR;  Service: Plastics;  Laterality: Left;  open ulcer  . Cardiac catheterization  06/06/2012    EF 35-40% non-ischemia cardiomyopathy; no significant CAD (acute care setting)    Family History  Problem Relation Age of Onset  . Heart failure Mother   . Cancer Mother   . Cancer Father   . Diabetes Brother   . Diabetes Sister   . Cancer Sister    Social History:  reports that she has never smoked. She has never used smokeless tobacco. She reports that she does not drink alcohol or use illicit drugs.  Allergies:  Allergies  Allergen Reactions  . Lisinopril Other (See Comments)    cough    Medications Prior to Admission  Medication Sig Dispense Refill  . acidophilus (  RISAQUAD) CAPS Take 1 capsule by mouth daily.      Marland Kitchen aspirin 81 MG chewable tablet Chew 81 mg by mouth daily.        . calcium-vitamin D (OSCAL WITH D) 500-200 MG-UNIT per tablet Take 1 tablet by mouth daily.      . Coenzyme Q10 (CO Q-10) 200 MG CAPS Take 200 mg by mouth daily.      . furosemide (LASIX) 40 MG tablet TAKE ONE (1) TABLET EACH DAY  90 tablet  3  . hydrALAZINE (APRESOLINE) 25 MG tablet TAKE 1 TABLET BY MOUTH TWICE A DAY.  180 tablet  3  . losartan (COZAAR) 100 MG tablet Take 1 tablet (100 mg total) by mouth daily.  90 tablet  3  . metFORMIN (GLUCOPHAGE) 1000 MG tablet Take 500 mg by mouth 2 (two) times daily with a meal.       .  metoprolol (LOPRESSOR) 100 MG tablet Take 1 tablet (100 mg total) by mouth 2 (two) times daily.  90 tablet  3  . omega-3 acid ethyl esters (LOVAZA) 1 G capsule Take 2 g by mouth daily.      Marland Kitchen omeprazole (PRILOSEC) 40 MG capsule Take 40 mg by mouth 2 (two) times daily.       . potassium chloride SA (KLOR-CON M20) 20 MEQ tablet TAKE 1 TABLET (20 MEQ TOTAL) BY MOUTH DAILY.  90 tablet  3  . simvastatin (ZOCOR) 20 MG tablet Take 20 mg by mouth every evening.      . vitamin C (ASCORBIC ACID) 500 MG tablet Take 500 mg by mouth daily.      . Zinc 50 MG TABS Take 1 tablet by mouth daily.      Marland Kitchen ALPRAZolam (XANAX) 0.25 MG tablet Take 0.25 mg by mouth daily as needed for anxiety. For anxiety      . traMADol (ULTRAM) 50 MG tablet Take 50 mg by mouth every 6 (six) hours as needed. Maximum dose= 8 tablets per day. For pain        Results for orders placed during the hospital encounter of 07/28/14 (from the past 48 hour(s))  POCT I-STAT, CHEM 8     Status: Abnormal   Collection Time    07/28/14  7:33 AM      Result Value Ref Range   Sodium 139  137 - 147 mEq/L   Potassium 4.6  3.7 - 5.3 mEq/L   Chloride 103  96 - 112 mEq/L   BUN 23  6 - 23 mg/dL   Creatinine, Ser 1.61  0.50 - 1.10 mg/dL   Glucose, Bld 096 (*) 70 - 99 mg/dL   Calcium, Ion 0.45  4.09 - 1.30 mmol/L   TCO2 25  0 - 100 mmol/L   Hemoglobin 12.2  12.0 - 15.0 g/dL   HCT 81.1  91.4 - 78.2 %   No results found.  Review of Systems  Constitutional: Negative.   HENT: Negative.   Eyes: Negative.   Respiratory: Negative.   Cardiovascular: Negative.   Gastrointestinal: Negative.   Genitourinary: Negative.   Musculoskeletal: Negative.   Skin: Negative.   Neurological: Negative.   Psychiatric/Behavioral: Negative.     Blood pressure 148/58, pulse 59, temperature 97.8 F (36.6 C), temperature source Oral, resp. rate 18, height  (1.575 m), weight 98.034 kg (216 lb 2 oz), SpO2 100.00%. Physical Exam  Constitutional: She is oriented to  person, place, and time. She appears well-developed and well-nourished.  HENT:  Head:  Normocephalic and atraumatic.  Eyes: Conjunctivae and EOM are normal. Pupils are equal, round, and reactive to light.  Cardiovascular: Normal rate.   Respiratory: Effort normal.  Neurological: She is alert and oriented to person, place, and time.  Psychiatric: She has a normal mood and affect. Her behavior is normal. Judgment and thought content normal.     Assessment/Plan Plan for debridment and placement of Acell.  Risks were discussed and the patient wishes to proceed.  SANGER,Kim Oki 07/28/2014, 8:26 AM

## 2014-07-28 NOTE — Anesthesia Preprocedure Evaluation (Addendum)
Anesthesia Evaluation  Patient identified by MRN, date of birth, ID band Patient awake    Reviewed: Allergy & Precautions, H&P , NPO status , Patient's Chart, lab work & pertinent test results, reviewed documented beta blocker date and time   Airway Mallampati: I TM Distance: >3 FB Neck ROM: Full    Dental  (+) Upper Dentures, Lower Dentures, Edentulous Upper, Edentulous Lower   Pulmonary  breath sounds clear to auscultation        Cardiovascular hypertension, Pt. on medications and Pt. on home beta blockers + CAD and + Past MI Rhythm:Regular Rate:Normal     Neuro/Psych Seizures -, Well Controlled,     GI/Hepatic   Endo/Other  diabetes, Well Controlled, Type 2  Renal/GU      Musculoskeletal   Abdominal   Peds  Hematology   Anesthesia Other Findings   Reproductive/Obstetrics                          Anesthesia Physical Anesthesia Plan  ASA: III  Anesthesia Plan: General   Post-op Pain Management:    Induction: Intravenous  Airway Management Planned: LMA  Additional Equipment:   Intra-op Plan:   Post-operative Plan: Extubation in OR  Informed Consent: I have reviewed the patients History and Physical, chart, labs and discussed the procedure including the risks, benefits and alternatives for the proposed anesthesia with the patient or authorized representative who has indicated his/her understanding and acceptance.   Dental advisory given  Plan Discussed with: CRNA, Anesthesiologist and Surgeon  Anesthesia Plan Comments:         Anesthesia Quick Evaluation

## 2014-07-28 NOTE — Discharge Instructions (Signed)
Apply KY gel to the wound daily, apply gauze and rewrap with ace bandage.  Call Dr.Sanger for bleeding , fever, or if you notice a foul smell from bandage.    Post Anesthesia Home Care Instructions  Activity: Get plenty of rest for the remainder of the day. A responsible adult should stay with you for 24 hours following the procedure.  For the next 24 hours, DO NOT: -Drive a car -Advertising copywriter -Drink alcoholic beverages -Take any medication unless instructed by your physician -Make any legal decisions or sign important papers.  Meals: Start with liquid foods such as gelatin or soup. Progress to regular foods as tolerated. Avoid greasy, spicy, heavy foods. If nausea and/or vomiting occur, drink only clear liquids until the nausea and/or vomiting subsides. Call your physician if vomiting continues.  Special Instructions/Symptoms: Your throat may feel dry or sore from the anesthesia or the breathing tube placed in your throat during surgery. If this causes discomfort, gargle with warm salt water. The discomfort should disappear within 24 hours.

## 2014-07-28 NOTE — Op Note (Signed)
Operative Note   DATE OF OPERATION: 07/28/2014  LOCATION: [facility]  SURGICAL DIVISION: Plastic Surgery  PREOPERATIVE DIAGNOSES:  Bilateral lower extremity wounds (Left 4 x 3 cm and right 2 x 2 and 2 x 2 cm)  POSTOPERATIVE DIAGNOSES:  same  PROCEDURE:  Preparation of bilateral lower extremity wounds for placement of Acell (7 x 10 cm and 500 mg) after debridement of skin and subcutaneous tissue  SURGEON: Wayland Denis, DO  ASSISTANT: Shawn Rayburn, PA  ANESTHESIA:  General.   COMPLICATIONS: None.   INDICATIONS FOR PROCEDURE:  The patient, [name] is a 80 y.o. female born on 1931/09/13, is here for treatment of bilateral lower extremity wounds. MRN: [id number]  CONSENT:  Informed consent was obtained directly from the patient. Risks, benefits and alternatives were fully discussed. Specific risks including but not limited to bleeding, infection, hematoma, seroma, scarring, pain, infection, contracture, asymmetry, wound healing problems, and need for further surgery were all discussed. The patient did have an ample opportunity to have questions answered to satisfaction.   DESCRIPTION OF PROCEDURE:  The patient was taken to the operating room. SCDs were placed and IV antibiotics were given. The patient's operative site was prepped and draped in a sterile fashion. A time out was performed and all information was confirmed to be correct.  General anesthesia was administered.  The wounds were debrided of skin and non viable soft tissue.  There was good blood flow.  The Acell powder and sheet were applied and secured with 5-0 vicryl.  The adaptic was placed and secured with 5-0 Vicryl. Ky Gel was placed with 4 x 4 gauze, kerlex and an Ace. The patient tolerated the procedure well.  There were no complications. The patient was allowed to wake from anesthesia, extubated and taken to the recovery room in satisfactory condition.

## 2014-07-29 ENCOUNTER — Encounter (HOSPITAL_BASED_OUTPATIENT_CLINIC_OR_DEPARTMENT_OTHER): Payer: Self-pay | Admitting: Plastic Surgery

## 2014-08-01 ENCOUNTER — Inpatient Hospital Stay (HOSPITAL_COMMUNITY): Payer: Medicare Other

## 2014-08-01 ENCOUNTER — Encounter (HOSPITAL_COMMUNITY): Payer: Self-pay | Admitting: Emergency Medicine

## 2014-08-01 ENCOUNTER — Inpatient Hospital Stay (HOSPITAL_COMMUNITY)
Admission: EM | Admit: 2014-08-01 | Discharge: 2014-08-05 | DRG: 871 | Disposition: A | Discharge status: Home / Self Care | Payer: Medicare Other | Attending: Internal Medicine | Admitting: Internal Medicine

## 2014-08-01 ENCOUNTER — Emergency Department (HOSPITAL_COMMUNITY): Payer: Medicare Other

## 2014-08-01 DIAGNOSIS — L97909 Non-pressure chronic ulcer of unspecified part of unspecified lower leg with unspecified severity: Secondary | ICD-10-CM | POA: Diagnosis present

## 2014-08-01 DIAGNOSIS — J9600 Acute respiratory failure, unspecified whether with hypoxia or hypercapnia: Secondary | ICD-10-CM | POA: Diagnosis present

## 2014-08-01 DIAGNOSIS — I2510 Atherosclerotic heart disease of native coronary artery without angina pectoris: Secondary | ICD-10-CM | POA: Diagnosis present

## 2014-08-01 DIAGNOSIS — Z79899 Other long term (current) drug therapy: Secondary | ICD-10-CM | POA: Diagnosis not present

## 2014-08-01 DIAGNOSIS — Y95 Nosocomial condition: Secondary | ICD-10-CM

## 2014-08-01 DIAGNOSIS — F419 Anxiety disorder, unspecified: Secondary | ICD-10-CM

## 2014-08-01 DIAGNOSIS — E1149 Type 2 diabetes mellitus with other diabetic neurological complication: Secondary | ICD-10-CM

## 2014-08-01 DIAGNOSIS — E1142 Type 2 diabetes mellitus with diabetic polyneuropathy: Secondary | ICD-10-CM

## 2014-08-01 DIAGNOSIS — I5042 Chronic combined systolic (congestive) and diastolic (congestive) heart failure: Secondary | ICD-10-CM | POA: Diagnosis present

## 2014-08-01 DIAGNOSIS — E119 Type 2 diabetes mellitus without complications: Secondary | ICD-10-CM | POA: Diagnosis present

## 2014-08-01 DIAGNOSIS — Z9849 Cataract extraction status, unspecified eye: Secondary | ICD-10-CM

## 2014-08-01 DIAGNOSIS — Z66 Do not resuscitate: Secondary | ICD-10-CM | POA: Diagnosis present

## 2014-08-01 DIAGNOSIS — E785 Hyperlipidemia, unspecified: Secondary | ICD-10-CM | POA: Diagnosis present

## 2014-08-01 DIAGNOSIS — I471 Supraventricular tachycardia: Secondary | ICD-10-CM

## 2014-08-01 DIAGNOSIS — R569 Unspecified convulsions: Secondary | ICD-10-CM | POA: Diagnosis present

## 2014-08-01 DIAGNOSIS — L98499 Non-pressure chronic ulcer of skin of other sites with unspecified severity: Secondary | ICD-10-CM

## 2014-08-01 DIAGNOSIS — I079 Rheumatic tricuspid valve disease, unspecified: Secondary | ICD-10-CM | POA: Diagnosis present

## 2014-08-01 DIAGNOSIS — Z961 Presence of intraocular lens: Secondary | ICD-10-CM

## 2014-08-01 DIAGNOSIS — N179 Acute kidney failure, unspecified: Secondary | ICD-10-CM | POA: Diagnosis present

## 2014-08-01 DIAGNOSIS — I509 Heart failure, unspecified: Secondary | ICD-10-CM | POA: Diagnosis present

## 2014-08-01 DIAGNOSIS — K219 Gastro-esophageal reflux disease without esophagitis: Secondary | ICD-10-CM | POA: Diagnosis present

## 2014-08-01 DIAGNOSIS — I429 Cardiomyopathy, unspecified: Secondary | ICD-10-CM | POA: Diagnosis present

## 2014-08-01 DIAGNOSIS — I428 Other cardiomyopathies: Secondary | ICD-10-CM | POA: Diagnosis present

## 2014-08-01 DIAGNOSIS — I10 Essential (primary) hypertension: Secondary | ICD-10-CM | POA: Diagnosis present

## 2014-08-01 DIAGNOSIS — J189 Pneumonia, unspecified organism: Secondary | ICD-10-CM | POA: Diagnosis present

## 2014-08-01 DIAGNOSIS — I739 Peripheral vascular disease, unspecified: Secondary | ICD-10-CM | POA: Diagnosis present

## 2014-08-01 DIAGNOSIS — J9601 Acute respiratory failure with hypoxia: Secondary | ICD-10-CM

## 2014-08-01 DIAGNOSIS — A419 Sepsis, unspecified organism: Secondary | ICD-10-CM | POA: Diagnosis not present

## 2014-08-01 DIAGNOSIS — Z7982 Long term (current) use of aspirin: Secondary | ICD-10-CM

## 2014-08-01 DIAGNOSIS — I83009 Varicose veins of unspecified lower extremity with ulcer of unspecified site: Secondary | ICD-10-CM | POA: Diagnosis present

## 2014-08-01 DIAGNOSIS — R6520 Severe sepsis without septic shock: Secondary | ICD-10-CM | POA: Diagnosis present

## 2014-08-01 DIAGNOSIS — F411 Generalized anxiety disorder: Secondary | ICD-10-CM | POA: Diagnosis present

## 2014-08-01 DIAGNOSIS — R0602 Shortness of breath: Secondary | ICD-10-CM | POA: Diagnosis not present

## 2014-08-01 LAB — COMPREHENSIVE METABOLIC PANEL
ALBUMIN: 3.1 g/dL — AB (ref 3.5–5.2)
ALK PHOS: 66 U/L (ref 39–117)
ALT: 9 U/L (ref 0–35)
ANION GAP: 16 — AB (ref 5–15)
AST: 20 U/L (ref 0–37)
BUN: 23 mg/dL (ref 6–23)
CALCIUM: 8.9 mg/dL (ref 8.4–10.5)
CO2: 21 mEq/L (ref 19–32)
Chloride: 99 mEq/L (ref 96–112)
Creatinine, Ser: 1.21 mg/dL — ABNORMAL HIGH (ref 0.50–1.10)
GFR calc Af Amer: 48 mL/min — ABNORMAL LOW (ref >90)
GFR calc non Af Amer: 41 mL/min — ABNORMAL LOW (ref >90)
GLUCOSE: 125 mg/dL — AB (ref 70–99)
Potassium: 4 mEq/L (ref 3.7–5.3)
SODIUM: 136 meq/L — AB (ref 137–147)
Total Bilirubin: 0.5 mg/dL (ref 0.3–1.2)
Total Protein: 7 g/dL (ref 6.0–8.3)

## 2014-08-01 LAB — CBC WITH DIFFERENTIAL/PLATELET
BASOS PCT: 0 % (ref 0–1)
Basophils Absolute: 0 10*3/uL (ref 0.0–0.1)
EOS ABS: 0 10*3/uL (ref 0.0–0.7)
EOS PCT: 0 % (ref 0–5)
HCT: 37.1 % (ref 36.0–46.0)
Hemoglobin: 11.4 g/dL — ABNORMAL LOW (ref 12.0–15.0)
LYMPHS ABS: 0.9 10*3/uL (ref 0.7–4.0)
Lymphocytes Relative: 7 % — ABNORMAL LOW (ref 12–46)
MCH: 23.6 pg — AB (ref 26.0–34.0)
MCHC: 30.7 g/dL (ref 30.0–36.0)
MCV: 76.7 fL — AB (ref 78.0–100.0)
Monocytes Absolute: 0.6 10*3/uL (ref 0.1–1.0)
Monocytes Relative: 4 % (ref 3–12)
Neutro Abs: 11.6 10*3/uL — ABNORMAL HIGH (ref 1.7–7.7)
Neutrophils Relative %: 89 % — ABNORMAL HIGH (ref 43–77)
Platelets: 275 10*3/uL (ref 150–400)
RBC: 4.84 MIL/uL (ref 3.87–5.11)
RDW: 16 % — ABNORMAL HIGH (ref 11.5–15.5)
WBC: 13.1 10*3/uL — ABNORMAL HIGH (ref 4.0–10.5)

## 2014-08-01 LAB — URINALYSIS, ROUTINE W REFLEX MICROSCOPIC
Bilirubin Urine: NEGATIVE
Glucose, UA: NEGATIVE mg/dL
Hgb urine dipstick: NEGATIVE
Ketones, ur: NEGATIVE mg/dL
Leukocytes, UA: NEGATIVE
Nitrite: NEGATIVE
Protein, ur: NEGATIVE mg/dL
Specific Gravity, Urine: 1.016 (ref 1.005–1.030)
Urobilinogen, UA: 0.2 mg/dL (ref 0.0–1.0)
pH: 5 (ref 5.0–8.0)

## 2014-08-01 LAB — CBC
HCT: 35.1 % — ABNORMAL LOW (ref 36.0–46.0)
HEMOGLOBIN: 11 g/dL — AB (ref 12.0–15.0)
MCH: 24.1 pg — ABNORMAL LOW (ref 26.0–34.0)
MCHC: 31.3 g/dL (ref 30.0–36.0)
MCV: 76.8 fL — AB (ref 78.0–100.0)
Platelets: 284 10*3/uL (ref 150–400)
RBC: 4.57 MIL/uL (ref 3.87–5.11)
RDW: 16 % — ABNORMAL HIGH (ref 11.5–15.5)
WBC: 16.7 10*3/uL — ABNORMAL HIGH (ref 4.0–10.5)

## 2014-08-01 LAB — TROPONIN I

## 2014-08-01 LAB — GLUCOSE, CAPILLARY
GLUCOSE-CAPILLARY: 121 mg/dL — AB (ref 70–99)
GLUCOSE-CAPILLARY: 104 mg/dL — AB (ref 70–99)
GLUCOSE-CAPILLARY: 166 mg/dL — AB (ref 70–99)
GLUCOSE-CAPILLARY: 171 mg/dL — AB (ref 70–99)
Glucose-Capillary: 118 mg/dL — ABNORMAL HIGH (ref 70–99)

## 2014-08-01 LAB — PRO B NATRIURETIC PEPTIDE: Pro B Natriuretic peptide (BNP): 3920 pg/mL — ABNORMAL HIGH (ref 0–450)

## 2014-08-01 LAB — LACTIC ACID, PLASMA
LACTIC ACID, VENOUS: 2.7 mmol/L — AB (ref 0.5–2.2)
Lactic Acid, Venous: 2.7 mmol/L — ABNORMAL HIGH (ref 0.5–2.2)

## 2014-08-01 LAB — EXPECTORATED SPUTUM ASSESSMENT W REFEX TO RESP CULTURE

## 2014-08-01 LAB — PROTIME-INR
INR: 1.13 (ref 0.00–1.49)
PROTHROMBIN TIME: 14.5 s (ref 11.6–15.2)

## 2014-08-01 LAB — STREP PNEUMONIAE URINARY ANTIGEN: Strep Pneumo Urinary Antigen: NEGATIVE

## 2014-08-01 LAB — CREATININE, SERUM
Creatinine, Ser: 1.28 mg/dL — ABNORMAL HIGH (ref 0.50–1.10)
GFR calc non Af Amer: 38 mL/min — ABNORMAL LOW (ref >90)
GFR, EST AFRICAN AMERICAN: 45 mL/min — AB (ref >90)

## 2014-08-01 LAB — HEMOGLOBIN A1C
Hgb A1c MFr Bld: 5.6 % (ref <5.7)
MEAN PLASMA GLUCOSE: 114 mg/dL (ref <117)

## 2014-08-01 LAB — MRSA PCR SCREENING: MRSA BY PCR: POSITIVE — AB

## 2014-08-01 LAB — EXPECTORATED SPUTUM ASSESSMENT W GRAM STAIN, RFLX TO RESP C

## 2014-08-01 LAB — PROCALCITONIN: PROCALCITONIN: 61.5 ng/mL

## 2014-08-01 MED ORDER — ONDANSETRON HCL 4 MG PO TABS: 4.0000 mg | ORAL_TABLET | Freq: Four times a day (QID) | ORAL | Status: DC | PRN

## 2014-08-01 MED ORDER — OMEGA-3-ACID ETHYL ESTERS 1 G PO CAPS
2.0000 g | ORAL_CAPSULE | Freq: Every day | ORAL | Status: DC
  Administered 2014-08-01 – 2014-08-05 (×5): 2 g via ORAL
  Filled 2014-08-01 (×5): qty 2

## 2014-08-01 MED ORDER — SODIUM CHLORIDE 0.9 % IV SOLN
Freq: Once | INTRAVENOUS | Status: AC
  Administered 2014-08-01: 06:00:00 via INTRAVENOUS

## 2014-08-01 MED ORDER — GUAIFENESIN-DM 100-10 MG/5ML PO SYRP
5.0000 mL | ORAL_SOLUTION | ORAL | Status: DC | PRN
  Filled 2014-08-01: qty 5

## 2014-08-01 MED ORDER — ASPIRIN 81 MG PO CHEW
81.0000 mg | CHEWABLE_TABLET | Freq: Every day | ORAL | Status: DC
  Administered 2014-08-01 – 2014-08-05 (×5): 81 mg via ORAL
  Filled 2014-08-01 (×5): qty 1

## 2014-08-01 MED ORDER — METOPROLOL TARTRATE 25 MG PO TABS
25.0000 mg | ORAL_TABLET | Freq: Two times a day (BID) | ORAL | Status: DC
  Administered 2014-08-01 – 2014-08-02 (×2): 25 mg via ORAL
  Filled 2014-08-01 (×3): qty 1

## 2014-08-01 MED ORDER — METOPROLOL TARTRATE 50 MG PO TABS
50.0000 mg | ORAL_TABLET | Freq: Two times a day (BID) | ORAL | Status: DC
  Filled 2014-08-01: qty 1

## 2014-08-01 MED ORDER — IPRATROPIUM-ALBUTEROL 0.5-2.5 (3) MG/3ML IN SOLN
RESPIRATORY_TRACT | Status: AC
  Administered 2014-08-01: 3 mL
  Filled 2014-08-01: qty 3

## 2014-08-01 MED ORDER — VANCOMYCIN HCL 10 G IV SOLR
1500.0000 mg | Freq: Once | INTRAVENOUS | Status: AC
  Administered 2014-08-01: 1500 mg via INTRAVENOUS
  Filled 2014-08-01: qty 1500

## 2014-08-01 MED ORDER — HYDRALAZINE HCL 25 MG PO TABS
25.0000 mg | ORAL_TABLET | Freq: Two times a day (BID) | ORAL | Status: DC
  Administered 2014-08-01 – 2014-08-05 (×9): 25 mg via ORAL
  Filled 2014-08-01 (×10): qty 1

## 2014-08-01 MED ORDER — ALBUTEROL SULFATE (2.5 MG/3ML) 0.083% IN NEBU
2.5000 mg | INHALATION_SOLUTION | Freq: Two times a day (BID) | RESPIRATORY_TRACT | Status: DC
  Administered 2014-08-02 – 2014-08-03 (×2): 2.5 mg via RESPIRATORY_TRACT
  Filled 2014-08-01 (×2): qty 3

## 2014-08-01 MED ORDER — RISAQUAD PO CAPS
1.0000 | ORAL_CAPSULE | Freq: Every day | ORAL | Status: DC
  Administered 2014-08-01 – 2014-08-05 (×5): 1 via ORAL
  Filled 2014-08-01 (×5): qty 1

## 2014-08-01 MED ORDER — HYDROCODONE-ACETAMINOPHEN 5-325 MG PO TABS: 1.0000 | ORAL_TABLET | ORAL | Status: DC | PRN

## 2014-08-01 MED ORDER — SODIUM CHLORIDE 0.9 % IV SOLN: INTRAVENOUS | Status: AC

## 2014-08-01 MED ORDER — POLYETHYLENE GLYCOL 3350 17 G PO PACK
17.0000 g | PACK | Freq: Every day | ORAL | Status: DC | PRN
  Filled 2014-08-01: qty 1

## 2014-08-01 MED ORDER — PANTOPRAZOLE SODIUM 40 MG PO TBEC
80.0000 mg | DELAYED_RELEASE_TABLET | Freq: Two times a day (BID) | ORAL | Status: DC
  Administered 2014-08-01 – 2014-08-05 (×9): 80 mg via ORAL
  Filled 2014-08-01 (×7): qty 2

## 2014-08-01 MED ORDER — SIMVASTATIN 20 MG PO TABS
20.0000 mg | ORAL_TABLET | Freq: Every evening | ORAL | Status: DC
  Administered 2014-08-01 – 2014-08-04 (×4): 20 mg via ORAL
  Filled 2014-08-01 (×5): qty 1

## 2014-08-01 MED ORDER — CHLORHEXIDINE GLUCONATE CLOTH 2 % EX PADS
6.0000 | MEDICATED_PAD | Freq: Every day | CUTANEOUS | Status: AC
  Administered 2014-08-01 – 2014-08-05 (×5): 6 via TOPICAL

## 2014-08-01 MED ORDER — NITROGLYCERIN 0.4 MG SL SUBL
0.4000 mg | SUBLINGUAL_TABLET | SUBLINGUAL | Status: DC | PRN
  Administered 2014-08-01: 0.4 mg via SUBLINGUAL
  Filled 2014-08-01: qty 1

## 2014-08-01 MED ORDER — SODIUM CHLORIDE 0.9 % IV BOLUS (SEPSIS): 1000.0000 mL | INTRAVENOUS | Status: DC | PRN

## 2014-08-01 MED ORDER — VANCOMYCIN HCL 10 G IV SOLR
1250.0000 mg | INTRAVENOUS | Status: DC
  Administered 2014-08-02: 1250 mg via INTRAVENOUS
  Filled 2014-08-01: qty 1250

## 2014-08-01 MED ORDER — HEPARIN SODIUM (PORCINE) 5000 UNIT/ML IJ SOLN
5000.0000 [IU] | Freq: Three times a day (TID) | INTRAMUSCULAR | Status: DC
  Administered 2014-08-01 – 2014-08-05 (×13): 5000 [IU] via SUBCUTANEOUS
  Filled 2014-08-01 (×15): qty 1

## 2014-08-01 MED ORDER — LEVOFLOXACIN IN D5W 750 MG/150ML IV SOLN: 750.0000 mg | Freq: Once | INTRAVENOUS | Status: DC

## 2014-08-01 MED ORDER — DEXTROSE 5 % IV SOLN
2.0000 g | Freq: Three times a day (TID) | INTRAVENOUS | Status: DC
  Filled 2014-08-01 (×3): qty 2

## 2014-08-01 MED ORDER — SODIUM CHLORIDE 0.9 % IV BOLUS (SEPSIS)
500.0000 mL | Freq: Once | INTRAVENOUS | Status: AC
  Administered 2014-08-01: 500 mL via INTRAVENOUS

## 2014-08-01 MED ORDER — FUROSEMIDE 10 MG/ML IJ SOLN
40.0000 mg | Freq: Once | INTRAMUSCULAR | Status: AC
  Administered 2014-08-01: 40 mg via INTRAVENOUS
  Filled 2014-08-01: qty 4

## 2014-08-01 MED ORDER — ZINC SULFATE 220 (50 ZN) MG PO CAPS
220.0000 mg | ORAL_CAPSULE | Freq: Every day | ORAL | Status: DC
  Administered 2014-08-01 – 2014-08-05 (×5): 220 mg via ORAL
  Filled 2014-08-01 (×5): qty 1

## 2014-08-01 MED ORDER — ONDANSETRON HCL 4 MG/2ML IJ SOLN: 4.0000 mg | Freq: Four times a day (QID) | INTRAMUSCULAR | Status: DC | PRN

## 2014-08-01 MED ORDER — METOPROLOL TARTRATE 100 MG PO TABS
100.0000 mg | ORAL_TABLET | Freq: Two times a day (BID) | ORAL | Status: DC
  Filled 2014-08-01 (×2): qty 1

## 2014-08-01 MED ORDER — ZINC 50 MG PO TABS: 1.0000 | ORAL_TABLET | Freq: Every day | ORAL | Status: DC

## 2014-08-01 MED ORDER — ACETAMINOPHEN 500 MG PO TABS
1000.0000 mg | ORAL_TABLET | Freq: Once | ORAL | Status: AC
  Administered 2014-08-01: 1000 mg via ORAL
  Filled 2014-08-01: qty 2

## 2014-08-01 MED ORDER — ALBUTEROL SULFATE (2.5 MG/3ML) 0.083% IN NEBU: 2.5000 mg | INHALATION_SOLUTION | RESPIRATORY_TRACT | Status: DC | PRN

## 2014-08-01 MED ORDER — INSULIN ASPART 100 UNIT/ML ~~LOC~~ SOLN
0.0000 [IU] | Freq: Three times a day (TID) | SUBCUTANEOUS | Status: DC
  Administered 2014-08-01: 1 [IU] via SUBCUTANEOUS
  Administered 2014-08-03 (×3): 2 [IU] via SUBCUTANEOUS
  Administered 2014-08-04 (×2): 1 [IU] via SUBCUTANEOUS
  Administered 2014-08-04: 2 [IU] via SUBCUTANEOUS
  Administered 2014-08-05 (×2): 1 [IU] via SUBCUTANEOUS

## 2014-08-01 MED ORDER — VITAMIN C 500 MG PO TABS
500.0000 mg | ORAL_TABLET | Freq: Every day | ORAL | Status: DC
  Administered 2014-08-01 – 2014-08-05 (×5): 500 mg via ORAL
  Filled 2014-08-01 (×5): qty 1

## 2014-08-01 MED ORDER — CALCIUM CARBONATE-VITAMIN D 500-200 MG-UNIT PO TABS
1.0000 | ORAL_TABLET | Freq: Every day | ORAL | Status: DC
  Administered 2014-08-01 – 2014-08-05 (×5): 1 via ORAL
  Filled 2014-08-01 (×5): qty 1

## 2014-08-01 MED ORDER — PANTOPRAZOLE SODIUM 40 MG PO TBEC: 40.0000 mg | DELAYED_RELEASE_TABLET | Freq: Every day | ORAL | Status: DC

## 2014-08-01 MED ORDER — MUPIROCIN 2 % EX OINT
1.0000 "application " | TOPICAL_OINTMENT | Freq: Two times a day (BID) | CUTANEOUS | Status: DC
  Administered 2014-08-01 – 2014-08-05 (×8): 1 via NASAL
  Filled 2014-08-01 (×2): qty 22

## 2014-08-01 MED ORDER — IPRATROPIUM-ALBUTEROL 0.5-2.5 (3) MG/3ML IN SOLN
3.0000 mL | RESPIRATORY_TRACT | Status: DC
Start: 2014-08-01 — End: 2014-08-01
  Administered 2014-08-01 (×3): 3 mL via RESPIRATORY_TRACT
  Filled 2014-08-01 (×3): qty 3

## 2014-08-01 MED ORDER — PIPERACILLIN-TAZOBACTAM 3.375 G IVPB
3.3750 g | Freq: Three times a day (TID) | INTRAVENOUS | Status: DC
  Administered 2014-08-01 – 2014-08-05 (×12): 3.375 g via INTRAVENOUS
  Filled 2014-08-01 (×15): qty 50

## 2014-08-01 MED ORDER — INSULIN ASPART 100 UNIT/ML ~~LOC~~ SOLN: 0.0000 [IU] | Freq: Every day | SUBCUTANEOUS | Status: DC

## 2014-08-01 MED ORDER — PIPERACILLIN-TAZOBACTAM 3.375 G IVPB 30 MIN
3.3750 g | Freq: Once | INTRAVENOUS | Status: AC
  Administered 2014-08-01: 3.375 g via INTRAVENOUS
  Filled 2014-08-01: qty 50

## 2014-08-01 NOTE — ED Provider Notes (Signed)
CSN: [id number]     Arrival date & time 08/01/14  0414 History   First MD Initiated Contact with Patient 08/01/14 0424     Chief Complaint  Patient presents with  . Respiratory Distress     (Consider location/radiation/quality/duration/timing/severity/associated sxs/prior Treatment) HPI  [name] is a 80 y.o. female with a past medical history of CHF, EF of 35%, coming in with acute shortness of breath. Patient states that she has been short of breath all day today. She was sat in her bed all day did not take her medicines including Lasix. Per EMS she was satting 79% on room air while having diarrhea on arrival. She was placed on CPAP and oxygen was brought up to 92%. Upon arrival in the emergency department the patient was no longer tachypneic no longer in respiratory distress. She is denying any chest pain. She states that her weight is 210, and her discharge weight was 212 from 5 days ago.  Past Medical History  Diagnosis Date  . Diabetes mellitus   . Hypertension   . Hyperlipemia   . Phlebitis 11/30/11    RLE  . Skin ulcer(s) 11/30/11    BLE; draining  . GERD (gastroesophageal reflux disease)   . Arthritis   . Anxiety   . Peripheral vascular disease   . Neuromuscular disorder     numbness feet  . Seizure, convulsive   . Cardiomyopathy, EF 35-40% 06/05/2012  . Diastolic dysfunction, left ventricle, grade 2 by echo 06/04/12 06/05/2012  . TR (tricuspid regurgitation), mod 06/05/2012  . Acute on chronic systolic and diastolic heart failure, NYHA class 2 06/13/2012  . H/O echocardiogram 06/03/2012    EF 35-40%; systolic function moderately reduced; grade 2 diastolic dysfunction; mild AV regurg.; mod MV regurg.; LA severly dilated; mild-mod tricuspid valve regurg (acute care setting)  . History of nuclear stress test 06/29/2008    normal pattern of perfusion; negative for ischemia; low risk scan  . Wears glasses   . Full dentures    Past Surgical History  Procedure Laterality Date   . Abdominal hysterectomy    . Cataract extraction w/ intraocular lens  implant, bilateral  1990's  . Blood clot  1990's    RLE; "cut out"  . Eye surgery    . Incision and drainage of wound  01/21/2012    Procedure: IRRIGATION AND DEBRIDEMENT WOUND;  Surgeon: Wayland Denis, DO;  Location: [facility];  Service: Plastics;  Laterality: Bilateral;  I&D bilateral lower extremities and placement of wound vac left lower leg  . Skin split graft  02/18/2012    Procedure: SKIN GRAFT SPLIT THICKNESS;  Surgeon: Wayland Denis, DO;  Location: [facility];  Service: Plastics;  Laterality: Bilateral;  irrigation bilateral lower extremities, ACell placement bilateral lower legs, Wound Vac placement left lower leg  . Skin split graft  03/17/2012    Procedure: SKIN GRAFT SPLIT THICKNESS;  Surgeon: Wayland Denis, DO;  Location: [facility];  Service: Plastics;  Laterality: Left;  LEFT LEG SKIN GRAFT WITH SPLIT THICKNESS   . Application of wound vac  03/17/2012    Procedure: APPLICATION OF WOUND VAC;  Surgeon: Wayland Denis, DO;  Location: [facility];  Service: Plastics;  Laterality: Left;  open ulcer  . Cardiac catheterization  06/06/2012    EF 35-40% non-ischemia cardiomyopathy; no significant CAD (acute care setting)  . I&d extremity Bilateral 07/28/2014    Procedure: BILATERAL IRRIGATION AND DEBRIDEMENT LOWER LEGS/ACELL PLACEMENT;  Surgeon: Wayland Denis, DO;  Location: [facility];  Service: Plastics;  Laterality: Bilateral;  . Application of a-cell of extremity Bilateral 07/28/2014    Procedure: APPLICATION OF A-CELL OF EXTREMITY;  Surgeon: Wayland Denis, DO;  Location: [facility];  Service: Plastics;  Laterality: Bilateral;   Family History  Problem Relation Age of Onset  . Heart failure Mother   . Cancer Mother   . Cancer Father   . Diabetes Brother   . Diabetes Sister   . Cancer Sister    History  Substance Use Topics  . Smoking status: Never Smoker   .  Smokeless tobacco: Never Used  . Alcohol Use: No     Comment: 11/30/11 "used to drink; not much; don't drink anymore"   OB History   Grav Para Term Preterm Abortions TAB SAB Ect Mult Living                 Review of Systems    Allergies  Lisinopril  Home Medications   Prior to Admission medications   Medication Sig Start Date End Date Taking? Authorizing Provider  acidophilus (RISAQUAD) CAPS Take 1 capsule by mouth daily.    Historical Provider, MD  ALPRAZolam Prudy Feeler) 0.25 MG tablet Take 0.25 mg by mouth daily as needed for anxiety. For anxiety    Historical Provider, MD  aspirin 81 MG chewable tablet Chew 81 mg by mouth daily.      Historical Provider, MD  calcium-vitamin D (OSCAL WITH D) 500-200 MG-UNIT per tablet Take 1 tablet by mouth daily.    Historical Provider, MD  Coenzyme Q10 (CO Q-10) 200 MG CAPS Take 200 mg by mouth daily.    Historical Provider, MD  furosemide (LASIX) 40 MG tablet TAKE ONE (1) TABLET EACH DAY 07/15/14   Runell Gess, MD  hydrALAZINE (APRESOLINE) 25 MG tablet TAKE 1 TABLET BY MOUTH TWICE A DAY. 07/15/14   Runell Gess, MD  losartan (COZAAR) 100 MG tablet Take 1 tablet (100 mg total) by mouth daily. 05/04/14   Runell Gess, MD  metFORMIN (GLUCOPHAGE) 1000 MG tablet Take 500 mg by mouth 2 (two) times daily with a meal.     Historical Provider, MD  metoprolol (LOPRESSOR) 100 MG tablet Take 1 tablet (100 mg total) by mouth 2 (two) times daily. 05/04/14   Runell Gess, MD  omega-3 acid ethyl esters (LOVAZA) 1 G capsule Take 2 g by mouth daily.    Historical Provider, MD  omeprazole (PRILOSEC) 40 MG capsule Take 40 mg by mouth 2 (two) times daily.     Historical Provider, MD  potassium chloride SA (KLOR-CON M20) 20 MEQ tablet TAKE 1 TABLET (20 MEQ TOTAL) BY MOUTH DAILY. 02/03/14   Runell Gess, MD  simvastatin (ZOCOR) 20 MG tablet Take 20 mg by mouth every evening.    Historical Provider, MD  traMADol (ULTRAM) 50 MG tablet Take 50 mg by mouth every  6 (six) hours as needed. Maximum dose= 8 tablets per day. For pain    Historical Provider, MD  vitamin C (ASCORBIC ACID) 500 MG tablet Take 500 mg by mouth daily.    Historical Provider, MD  Zinc 50 MG TABS Take 1 tablet by mouth daily.    Historical Provider, MD   BP 115/46  Pulse 76  Temp(Src) 101.1 F (38.4 C) (Axillary)  Resp 22  SpO2 100% Physical Exam  Nursing note and vitals reviewed. Constitutional: She is oriented to person, place, and time. She appears well-developed and well-nourished.  No distress.  HENT:  Head: Normocephalic and atraumatic.  Nose: Nose normal.  Mouth/Throat: Oropharynx is clear and moist. No oropharyngeal exudate.  Eyes: Conjunctivae and EOM are normal. Pupils are equal, round, and reactive to light. No scleral icterus.  Neck: Normal range of motion. Neck supple. No JVD present. No tracheal deviation present. No thyromegaly present.  Cardiovascular: Normal rate, regular rhythm and normal heart sounds.  Exam reveals no gallop and no friction rub.   No murmur heard. Pulmonary/Chest: She is in respiratory distress. She has wheezes. She has rales. She exhibits no tenderness.  Crackles heard  diffusely in the posterior fields.  Abdominal: Soft. Bowel sounds are normal. She exhibits no distension and no mass. There is no tenderness. There is no rebound and no guarding.  Musculoskeletal: Normal range of motion. She exhibits edema. She exhibits no tenderness.  Trace bilateral lower extremity edema  Lymphadenopathy:    She has no cervical adenopathy.  Neurological: She is alert and oriented to person, place, and time.  Skin: Skin is warm and dry. No rash noted. She is not diaphoretic. No erythema. No pallor.    ED Course  Procedures (including critical care time) Labs Review Labs Reviewed  CBC WITH DIFFERENTIAL - Abnormal; Notable for the following:    WBC 13.1 (*)    Hemoglobin 11.4 (*)    MCV 76.7 (*)    MCH 23.6 (*)    RDW 16.0 (*)    Neutrophils  Relative % 89 (*)    Neutro Abs 11.6 (*)    Lymphocytes Relative 7 (*)    All other components within normal limits  COMPREHENSIVE METABOLIC PANEL - Abnormal; Notable for the following:    Sodium 136 (*)    Glucose, Bld 125 (*)    Creatinine, Ser 1.21 (*)    Albumin 3.1 (*)    GFR calc non Af Amer 41 (*)    GFR calc Af Amer 48 (*)    Anion gap 16 (*)    All other components within normal limits  PRO B NATRIURETIC PEPTIDE - Abnormal; Notable for the following:    Pro B Natriuretic peptide (BNP) 3920.0 (*)    All other components within normal limits  LACTIC ACID, PLASMA - Abnormal; Notable for the following:    Lactic Acid, Venous 2.7 (*)    All other components within normal limits  URINE CULTURE  CULTURE, BLOOD (ROUTINE X 2)  CULTURE, BLOOD (ROUTINE X 2)  TROPONIN I  PROTIME-INR  URINALYSIS, ROUTINE W REFLEX MICROSCOPIC  BLOOD GAS, ARTERIAL    Imaging Review Dg Chest 2 View  08/01/2014   CLINICAL DATA:  Respiratory distress.  EXAM: CHEST  2 VIEW  COMPARISON:  Chest radiograph July 29, 2013  FINDINGS: Patchy dense consolidation throughout right lung. Left lung is clear. No pleural effusions.  Cardiac silhouette appears mildly enlarged, mediastinal silhouette is nonsuspicious, mildly calcified aortic knob no pneumothorax.  Soft tissue planes and included osseous structures are nonsuspicious. Degenerative change of the thoracic spine and acromioclavicular joints.  IMPRESSION: Dense consolidations throughout the right lung concerning for pneumonia. Recommend followup chest radiograph after treatment to verify improvement.  Stable mild cardiomegaly.   Electronically Signed   By: Awilda Metro   On: 08/01/2014 05:04     EKG Interpretation None      MDM   Final diagnoses:  None    He presents emergency department for shortness of breath. Chest x-ray reveals a right-sided pneumonia. Because of the recent admission patient  is having hospital-acquired pneumonia she was  treated with vancomycin Zosyn and Levaquin. I do not believe she is having a CHF exacerbation at this time. She states she is at her normal weight, and her BNP is lower than her baseline. Will attempt to transition the patient to nasal cannula. Patient was admitted to the step down to 2 initial O2 saturation of 70% and mild hypotension. She was given a 500 cc bolus, Tylenol, and an addition to a 1 L given by antibiotics.   Tomasita Crumble, MD 08/01/14 [phone]

## 2014-08-01 NOTE — Progress Notes (Addendum)
ANTIBIOTIC CONSULT NOTE - INITIAL  Pharmacy Consult for vancomycin Indication: pneumonia  Allergies  Allergen Reactions  . Lisinopril Other (See Comments)    cough    Patient Measurements:   Adjusted Body Weight: 98 kg  Vital Signs: Temp: 101.1 F (38.4 C) (08/30 0422) Temp src: Axillary (08/30 0422) BP: 135/56 mmHg (08/30 0730) Pulse Rate: 67 (08/30 0730) Intake/Output from previous day:   Intake/Output from this shift: Total I/O In: -  Out: 250 [Urine:250]  Labs:  Recent Labs  08/01/14 0530  WBC 13.1*  HGB 11.4*  PLT 275  CREATININE 1.21*   The CrCl is unknown because both a height and weight (above a minimum accepted value) are required for this calculation. No results found for this basename: VANCOTROUGH, VANCOPEAK, VANCORANDOM, GENTTROUGH, GENTPEAK, GENTRANDOM, TOBRATROUGH, TOBRAPEAK, TOBRARND, AMIKACINPEAK, AMIKACINTROU, AMIKACIN,  in the last 72 hours   Microbiology: No results found for this or any previous visit (from the past 720 hour(s)).  Medical History: Past Medical History  Diagnosis Date  . Diabetes mellitus   . Hypertension   . Hyperlipemia   . Phlebitis 11/30/11    RLE  . Skin ulcer(s) 11/30/11    BLE; draining  . GERD (gastroesophageal reflux disease)   . Arthritis   . Anxiety   . Peripheral vascular disease   . Neuromuscular disorder     numbness feet  . Seizure, convulsive   . Cardiomyopathy, EF 35-40% 06/05/2012  . Diastolic dysfunction, left ventricle, grade 2 by echo 06/04/12 06/05/2012  . TR (tricuspid regurgitation), mod 06/05/2012  . Acute on chronic systolic and diastolic heart failure, NYHA class 2 06/13/2012  . H/O echocardiogram 06/03/2012    EF 35-40%; systolic function moderately reduced; grade 2 diastolic dysfunction; mild AV regurg.; mod MV regurg.; LA severly dilated; mild-mod tricuspid valve regurg (acute care setting)  . History of nuclear stress test 06/29/2008    normal pattern of perfusion; negative for ischemia; low  risk scan  . Wears glasses   . Full dentures     Medications:  Prescriptions prior to admission  Medication Sig Dispense Refill  . acidophilus (RISAQUAD) CAPS Take 1 capsule by mouth daily.      Marland Kitchen aspirin 81 MG chewable tablet Chew 81 mg by mouth daily.       . calcium-vitamin D (OSCAL WITH D) 500-200 MG-UNIT per tablet Take 1 tablet by mouth daily.      . Coenzyme Q10 (CO Q-10) 200 MG CAPS Take 200 mg by mouth daily.      . furosemide (LASIX) 40 MG tablet Take 40 mg by mouth daily.      . hydrALAZINE (APRESOLINE) 25 MG tablet Take 25 mg by mouth 2 (two) times daily.      Marland Kitchen losartan (COZAAR) 100 MG tablet Take 1 tablet (100 mg total) by mouth daily.  90 tablet  3  . metFORMIN (GLUCOPHAGE) 500 MG tablet Take 500 mg by mouth 2 (two) times daily with a meal.      . metoprolol (LOPRESSOR) 100 MG tablet Take 1 tablet (100 mg total) by mouth 2 (two) times daily.  90 tablet  3  . omega-3 acid ethyl esters (LOVAZA) 1 G capsule Take 2 g by mouth daily.      Marland Kitchen omeprazole (PRILOSEC) 40 MG capsule Take 40 mg by mouth 2 (two) times daily.       . potassium chloride SA (K-DUR,KLOR-CON) 20 MEQ tablet Take 20 mEq by mouth daily.      Marland Kitchen  simvastatin (ZOCOR) 20 MG tablet Take 20 mg by mouth every evening.      . vitamin C (ASCORBIC ACID) 500 MG tablet Take 500 mg by mouth daily.      . Zinc 50 MG TABS Take 1 tablet by mouth daily.       Assessment: 80 year old woman presents to the ED with respiratory distress from home.  S/p recent wound debridement of bilateral lower extremities on 8/26.  MD concerned about aspiration pneumonia.  Goal of Therapy:  Vancomycin trough level 15-20 mcg/ml  Plan:  Expected duration 7 days with resolution of temperature and/or normalization of WBC Measure antibiotic drug levels at steady state Follow up culture results Vancomycin 1g IV given in the ED.  Will continue with vancomycin 1250mg  IV q24 starting tomorrow.   Aztreonam ordered initially - Discussed with Dr. Thedore Mins  - will change to Zosyn since she has no antibiotic allergies.   Zosyn 3.375g IV q8h (infuse over 4 hours)   Mickeal Skinner 08/01/2014,8:54 AM  Addendum - patient actually received vancomycin 1.5g in the ED.  Will still continue with vancomycin 1250mg  daily starting tomorrow.  Celedonio Miyamoto, PharmD, BCPS Clinical Pharmacist Pager [phone]

## 2014-08-01 NOTE — ED Notes (Signed)
Antibiotics delayed for phlebotomy at bedside.

## 2014-08-01 NOTE — ED Notes (Signed)
EM[facility].  Pt having labored breathing at 28/min, O2 on room air 79%, placed on CPAP brought O2 up to 92%.  Pt has been having diarrhea and emesis (4-5 episodes).  148/76 BP, 80 HR and 28 breaths/min.

## 2014-08-01 NOTE — Progress Notes (Signed)
RT attempt ABG x2 without success. RN made aware.

## 2014-08-01 NOTE — H&P (Signed)
Patient Demographics  [name], is a 80 y.o. female  MRN: [id number]   DOB - [birth year]  Admit Date - 08/01/2014  Outpatient Primary MD for the patient is [name]., MD   With History of -  Past Medical History  Diagnosis Date  . Diabetes mellitus   . Hypertension   . Hyperlipemia   . Phlebitis 11/30/11    RLE  . Skin ulcer(s) 11/30/11    BLE; draining  . GERD (gastroesophageal reflux disease)   . Arthritis   . Anxiety   . Peripheral vascular disease   . Neuromuscular disorder     numbness feet  . Seizure, convulsive   . Cardiomyopathy, EF 35-40% 06/05/2012  . Diastolic dysfunction, left ventricle, grade 2 by echo 06/04/12 06/05/2012  . TR (tricuspid regurgitation), mod 06/05/2012  . Acute on chronic systolic and diastolic heart failure, NYHA class 2 06/13/2012  . H/O echocardiogram 06/03/2012    EF 35-40%; systolic function moderately reduced; grade 2 diastolic dysfunction; mild AV regurg.; mod MV regurg.; LA severly dilated; mild-mod tricuspid valve regurg (acute care setting)  . History of nuclear stress test 06/29/2008    normal pattern of perfusion; negative for ischemia; low risk scan  . Wears glasses   . Full dentures       Past Surgical History  Procedure Laterality Date  . Abdominal hysterectomy    . Cataract extraction w/ intraocular lens  implant, bilateral  1990's  . Blood clot  1990's    RLE; "cut out"  . Eye surgery    . Incision and drainage of wound  01/21/2012    Procedure: IRRIGATION AND DEBRIDEMENT WOUND;  Surgeon: Wayland Denis, DO;  Location: [facility];  Service: Plastics;  Laterality: Bilateral;  I&D bilateral lower extremities and placement of wound vac left lower leg  . Skin split graft  02/18/2012    Procedure: SKIN GRAFT SPLIT THICKNESS;  Surgeon: Wayland Denis, DO;  Location: [facility];  Service: Plastics;  Laterality: Bilateral;  irrigation bilateral lower extremities, ACell placement bilateral lower legs, Wound Vac placement left lower leg  . Skin split graft  03/17/2012    Procedure: SKIN GRAFT SPLIT THICKNESS;  Surgeon: Wayland Denis, DO;  Location: [facility];  Service: Plastics;  Laterality: Left;  LEFT LEG SKIN GRAFT WITH SPLIT THICKNESS   . Application of wound vac  03/17/2012    Procedure: APPLICATION OF WOUND VAC;  Surgeon: Wayland Denis, DO;  Location: [facility];  Service: Plastics;  Laterality: Left;  open ulcer  . Cardiac catheterization  06/06/2012    EF 35-40% non-ischemia cardiomyopathy; no significant CAD (acute care setting)  . I&d extremity Bilateral 07/28/2014    Procedure: BILATERAL IRRIGATION AND DEBRIDEMENT LOWER LEGS/ACELL PLACEMENT;  Surgeon: Wayland Denis, DO;  Location: [facility];  Service: Plastics;  Laterality: Bilateral;  . Application of a-cell of extremity Bilateral 07/28/2014    Procedure: APPLICATION OF A-CELL OF EXTREMITY;  Surgeon: Wayland Denis, DO;  Location: [facility];  Service: Plastics;  Laterality: Bilateral;    in for   Chief Complaint  Patient presents with  . Respiratory Distress     HPI  [name]  is a 80 y.o. female, history of chronic venous stasis ulcer and lower extremities for which she dove and plastic surgery correction few days ago by Dr. Helyn Numbers, chronic systolic and diastolic heart failure EF 35-40%, dyslipidemia, hypertension, diabetes mellitus type 2, who was doing fine at home however around 3 AM this morning she woke up with episode of nausea vomiting and diarrhea, within the next few minutes she started feeling short of breath came to the ER where she was found to be septic, hypotensive, in acute respiratory failure with right-sided pneumonia. I was called to admit.    Review of Systems    In addition to the HPI above, + Fever-chills, No  Headache, No changes with Vision or hearing, No problems swallowing food or Liquids, No Chest pain, ++ Cough & Shortness of Breath, No Abdominal pain, + Nausea & Vommitting, Bowel movements were loose, No Blood in stool or Urine, No dysuria, No new skin rashes or bruises, No new joints pains-aches,  No new weakness, tingling, numbness in any extremity, No recent weight gain or loss, No polyuria, polydypsia or polyphagia, No significant Mental Stressors.  A full 10 point Review of Systems was done, except as stated above, all other Review of Systems were negative.    Social History History  Substance Use Topics  . Smoking status: Never Smoker   . Smokeless tobacco: Never Used  . Alcohol Use: No     Comment: 11/30/11 "used to drink; not much; don't drink anymore"      Family History Family History  Problem Relation Age of Onset  . Heart failure Mother   . Cancer Mother   . Cancer Father   . Diabetes Brother   . Diabetes Sister   . Cancer Sister       Prior to Admission medications   Medication Sig Start Date End Date Taking? Authorizing Provider  acidophilus (RISAQUAD) CAPS Take 1 capsule by mouth daily.   Yes Historical Provider, MD  aspirin 81 MG chewable tablet Chew 81 mg by mouth daily.    Yes Historical Provider, MD  calcium-vitamin D (OSCAL WITH D) 500-200 MG-UNIT per tablet Take 1 tablet by mouth daily.   Yes Historical Provider, MD  Coenzyme Q10 (CO Q-10) 200 MG CAPS Take 200 mg by mouth daily.   Yes Historical Provider, MD  furosemide (LASIX) 40 MG tablet Take 40 mg by mouth daily.   Yes Historical Provider, MD  hydrALAZINE (APRESOLINE) 25 MG tablet Take 25 mg by mouth 2 (two) times daily.   Yes Historical Provider, MD  losartan (COZAAR) 100 MG tablet Take 1 tablet (100 mg total) by mouth daily. 05/04/14  Yes Runell Gess, MD  metFORMIN (GLUCOPHAGE) 500 MG tablet Take 500 mg by mouth 2 (two) times daily with a meal.   Yes Historical Provider, MD  metoprolol  (LOPRESSOR) 100 MG tablet Take 1 tablet (100 mg total) by mouth 2 (two) times daily. 05/04/14  Yes Runell Gess, MD  omega-3 acid ethyl esters (LOVAZA) 1 G capsule Take 2 g by mouth daily.   Yes Historical Provider, MD  omeprazole (PRILOSEC) 40 MG capsule Take 40 mg by mouth 2 (two) times daily.    Yes Historical Provider, MD  potassium chloride SA (K-DUR,KLOR-CON)  20 MEQ tablet Take 20 mEq by mouth daily.   Yes Historical Provider, MD  simvastatin (ZOCOR) 20 MG tablet Take 20 mg by mouth every evening.   Yes Historical Provider, MD  vitamin C (ASCORBIC ACID) 500 MG tablet Take 500 mg by mouth daily.   Yes Historical Provider, MD  Zinc 50 MG TABS Take 1 tablet by mouth daily.   Yes Historical Provider, MD    Allergies  Allergen Reactions  . Lisinopril Other (See Comments)    cough    Physical Exam  Vitals  Blood pressure 64/50, pulse 54, temperature 97.8 F (36.6 C), temperature source Oral, resp. rate 24, SpO2 98.00%.   1. General elderly AA female lying in bed in NAD,   2. Normal affect and insight, Not Suicidal or Homicidal, Awake Alert, Oriented X 3.  3. No F.N deficits, ALL C.Nerves Intact, Strength 5/5 all 4 extremities, Sensation intact all 4 extremities, Plantars down going.  4. Ears and Eyes appear Normal, Conjunctivae clear, PERRLA. Moist Oral Mucosa.  5. Supple Neck, No JVD, No cervical lymphadenopathy appriciated, No Carotid Bruits.  6. Symmetrical Chest wall movement, Good air movement bilaterally, R sided rales.  7. RRR, No Gallops, Rubs or Murmurs, No Parasternal Heave.  8. Positive Bowel Sounds, Abdomen Soft, No tenderness, No organomegaly appriciated,No rebound -guarding or rigidity.  9.  No Cyanosis, Normal Skin Turgor, No Skin Rash or Bruise. Wearing compressive UNA boots in both legs.  10. Good muscle tone,  joints appear normal , no effusions, Normal ROM.  11. No Palpable Lymph Nodes in Neck or Axillae     Data Review  CBC  Recent Labs Lab  07/28/14 0733 08/01/14 0530 08/01/14 0851  WBC  --  13.1* 16.7*  HGB 12.2 11.4* 11.0*  HCT 36.0 37.1 35.1*  PLT  --  275 284  MCV  --  76.7* 76.8*  MCH  --  23.6* 24.1*  MCHC  --  30.7 31.3  RDW  --  16.0* 16.0*  LYMPHSABS  --  0.9  --   MONOABS  --  0.6  --   EOSABS  --  0.0  --   BASOSABS  --  0.0  --    ------------------------------------------------------------------------------------------------------------------  Chemistries   Recent Labs Lab 07/28/14 0733 08/01/14 0530 08/01/14 0851  NA 139 136*  --   K 4.6 4.0  --   CL 103 99  --   CO2  --  21  --   GLUCOSE 108* 125*  --   BUN 23 23  --   CREATININE 1.10 1.21* 1.28*  CALCIUM  --  8.9  --   AST  --  20  --   ALT  --  9  --   ALKPHOS  --  66  --   BILITOT  --  0.5  --    ------------------------------------------------------------------------------------------------------------------ CrCl is unknown because both a height and weight (above a minimum accepted value) are required for this calculation. ------------------------------------------------------------------------------------------------------------------ No results found for this basename: TSH, T4TOTAL, FREET3, T3FREE, THYROIDAB,  in the last 72 hours   Coagulation profile  Recent Labs Lab 08/01/14 0530  INR 1.13   ------------------------------------------------------------------------------------------------------------------- No results found for this basename: DDIMER,  in the last 72 hours -------------------------------------------------------------------------------------------------------------------  Cardiac Enzymes  Recent Labs Lab 08/01/14 0530  TROPONINI <0.30   ------------------------------------------------------------------------------------------------------------------ No components found with this basename: POCBNP,     ---------------------------------------------------------------------------------------------------------------  Urinalysis    Component Value Date/Time   COLORURINE YELLOW 08/01/2014  0655   APPEARANCEUR CLEAR 08/01/2014 0655   LABSPEC 1.016 08/01/2014 0655   PHURINE 5.0 08/01/2014 0655   GLUCOSEU NEGATIVE 08/01/2014 0655   HGBUR NEGATIVE 08/01/2014 0655   BILIRUBINUR NEGATIVE 08/01/2014 0655   KETONESUR NEGATIVE 08/01/2014 0655   PROTEINUR NEGATIVE 08/01/2014 0655   UROBILINOGEN 0.2 08/01/2014 0655   NITRITE NEGATIVE 08/01/2014 0655   LEUKOCYTESUR NEGATIVE 08/01/2014 0655    ----------------------------------------------------------------------------------------------------------------  Imaging results:   Dg Chest 2 View  08/01/2014   CLINICAL DATA:  Respiratory distress.  EXAM: CHEST  2 VIEW  COMPARISON:  Chest radiograph July 29, 2013  FINDINGS: Patchy dense consolidation throughout right lung. Left lung is clear. No pleural effusions.  Cardiac silhouette appears mildly enlarged, mediastinal silhouette is nonsuspicious, mildly calcified aortic knob no pneumothorax.  Soft tissue planes and included osseous structures are nonsuspicious. Degenerative change of the thoracic spine and acromioclavicular joints.  IMPRESSION: Dense consolidations throughout the right lung concerning for pneumonia. Recommend followup chest radiograph after treatment to verify improvement.  Stable mild cardiomegaly.   Electronically Signed   By: Awilda Metro   On: 08/01/2014 05:04   Dg Abd Portable 1v  08/01/2014   CLINICAL DATA:  Nausea, vomiting and diarrhea  EXAM: PORTABLE ABDOMEN - 1 VIEW  COMPARISON:  Chest x-ray obtained earlier today  FINDINGS: Unremarkable bowel gas pattern. Gas is noted distally to the level of the rectum. Air is also present with an loops of nondilated small bowel in the mid abdomen. This is a nonspecific finding. Multilevel degenerative change throughout the visualized spine. Mild  bilateral hip joint osteoarthritis. No acute osseous abnormality.  IMPRESSION: Nonobstructed bowel gas pattern.   Electronically Signed   By: Malachy Moan M.D.   On: 08/01/2014 09:13    My personal review of EKG: Rhythm NSR, Rate  69 /min, old stable non specific lateral lead ST changes    Assessment & Plan   1. Acute respiratory failure due to aspiration HCAP with sepsis. Will be admitted to step down unit, blood and sputum cultures, empiric IV antibiotics which will include vancomycin and Zosyn. Currently on 4 L nasal cannula oxygen along with nebulizer treatments as needed. Will have speech therapy evaluate. Will check lactic acid and Percocet on a levels .    2. Chronic systolic and diastolic heart failure with last EF 35-40% on echogram - 20 blood pressure too low, she is septic, we'll have to hold Lasix and beta blocker along with hydralazine. We'll monitor closely. For now have to give IV fluids as systolic blood pressure in mid 70s. And monitor.    3. Chronic venous stasis ulcers. Recent surgery by Dr. Helyn Numbers - continue UNA boots. Wound care consult. Not on antibiotics prior to admission. Continue zinc and vitamin supplementation.    4. ARF due to #1 above. Gently hydrate and monitor a wonderful toxins.    5. GERD. Continue PPI.    6. Dyslipidemia. Continue home dose statin.     DVT Prophylaxis Heparin    AM Labs Ordered, also please review Full Orders  Family Communication: Admission, patients condition and plan of care including tests being ordered have been discussed with the patient and daughters who indicate understanding and agree with the plan and Code Status.  Code Status DNR  Likely DC to  TBD  Condition GUARDED    Time spent in minutes : 40    SINGH,PRASHANT K M.D on 08/01/2014 at 12:12 PM  Between 7am to 7pm - Pager - [phone]  After  7pm go to [email or web address] - password [email or web address]  And look for the night coverage person covering me after  hours  [facility] Office  [phone]   **Disclaimer: This note may have been dictated with voice recognition software. Similar sounding words can inadvertently be transcribed and this note may contain transcription errors which may not have been corrected upon publication of note.**

## 2014-08-01 NOTE — ED Notes (Signed)
Per MD Thedore Mins pt placed on 4L nasal cannula.  Pt maintaining 100% saturation.

## 2014-08-02 ENCOUNTER — Inpatient Hospital Stay (HOSPITAL_COMMUNITY): Payer: Medicare Other

## 2014-08-02 LAB — GLUCOSE, CAPILLARY
GLUCOSE-CAPILLARY: 136 mg/dL — AB (ref 70–99)
Glucose-Capillary: 125 mg/dL — ABNORMAL HIGH (ref 70–99)
Glucose-Capillary: 125 mg/dL — ABNORMAL HIGH (ref 70–99)
Glucose-Capillary: 129 mg/dL — ABNORMAL HIGH (ref 70–99)
Glucose-Capillary: 146 mg/dL — ABNORMAL HIGH (ref 70–99)
Glucose-Capillary: 149 mg/dL — ABNORMAL HIGH (ref 70–99)
Glucose-Capillary: 174 mg/dL — ABNORMAL HIGH (ref 70–99)

## 2014-08-02 LAB — LEGIONELLA ANTIGEN, URINE: LEGIONELLA ANTIGEN, URINE: NEGATIVE

## 2014-08-02 LAB — CBC
HCT: 32.7 % — ABNORMAL LOW (ref 36.0–46.0)
Hemoglobin: 10 g/dL — ABNORMAL LOW (ref 12.0–15.0)
MCH: 23.5 pg — ABNORMAL LOW (ref 26.0–34.0)
MCHC: 30.6 g/dL (ref 30.0–36.0)
MCV: 76.8 fL — ABNORMAL LOW (ref 78.0–100.0)
Platelets: 253 10*3/uL (ref 150–400)
RBC: 4.26 MIL/uL (ref 3.87–5.11)
RDW: 16.1 % — ABNORMAL HIGH (ref 11.5–15.5)
WBC: 17.2 10*3/uL — ABNORMAL HIGH (ref 4.0–10.5)

## 2014-08-02 LAB — BASIC METABOLIC PANEL
ANION GAP: 12 (ref 5–15)
BUN: 20 mg/dL (ref 6–23)
CALCIUM: 8.5 mg/dL (ref 8.4–10.5)
CO2: 25 mEq/L (ref 19–32)
Chloride: 103 mEq/L (ref 96–112)
Creatinine, Ser: 1.15 mg/dL — ABNORMAL HIGH (ref 0.50–1.10)
GFR calc non Af Amer: 44 mL/min — ABNORMAL LOW (ref >90)
GFR, EST AFRICAN AMERICAN: 51 mL/min — AB (ref >90)
GLUCOSE: 121 mg/dL — AB (ref 70–99)
POTASSIUM: 4.4 meq/L (ref 3.7–5.3)
SODIUM: 140 meq/L (ref 137–147)

## 2014-08-02 LAB — URINE CULTURE
Colony Count: NO GROWTH
Culture: NO GROWTH

## 2014-08-02 MED ORDER — METOPROLOL TARTRATE 50 MG PO TABS
50.0000 mg | ORAL_TABLET | Freq: Two times a day (BID) | ORAL | Status: DC
  Administered 2014-08-02 – 2014-08-05 (×6): 50 mg via ORAL
  Filled 2014-08-02 (×7): qty 1

## 2014-08-02 MED ORDER — VANCOMYCIN HCL IN DEXTROSE 750-5 MG/150ML-% IV SOLN
750.0000 mg | Freq: Two times a day (BID) | INTRAVENOUS | Status: DC
  Administered 2014-08-02 – 2014-08-04 (×6): 750 mg via INTRAVENOUS
  Filled 2014-08-02 (×8): qty 150

## 2014-08-02 MED ORDER — LISINOPRIL 5 MG PO TABS: 5.0000 mg | ORAL_TABLET | Freq: Every day | ORAL | Status: DC

## 2014-08-02 MED ORDER — LOSARTAN POTASSIUM 25 MG PO TABS
12.5000 mg | ORAL_TABLET | Freq: Every day | ORAL | Status: DC
  Administered 2014-08-02 – 2014-08-05 (×4): 12.5 mg via ORAL
  Filled 2014-08-02 (×4): qty 0.5

## 2014-08-02 NOTE — Clinical Documentation Improvement (Signed)
Please clarify if "aspiration HCAP" can be further specified as one of the diagnoses listed below and document in pn or d/c summary.   Possible Clinical Conditions?   Aspiration Pneumonia (POA?) Gram Negative Pneumonia (POA?)  Bacterial pneumonia, specify type if known (POA?) Klebsiella PNA E Coli PNA Pseudomonas PNA Other Condition Cannot Clinically Determine   Supporting Information: Risk Factors: Resp Failure, Aspiration, HCAP Signs & Symptoms Acute respiratory failure due to aspiration HCAP with Sepsis per HP and PN 8/30 & 08/02/2014 MD concerned about aspiration pneumonia per [facility] notes 08/01/14  Diagnostics:  CXR: IMPRESSION:Dense consolidations throughout the right lung concerning for Pneumonia. 08/01/14  CXR: IMPRESSION:Persistent airspace disease in the right lung. Findings are suggestive for pneumonia 08/02/14  Treatment:  albuterol (PROVENTIL) (2.5 MG/3ML) 0.083% nebulizer solution 2.5 mg   piperacillin-tazobactam (ZOSYN) IVPB 3.375 g   vancomycin (VANCOCIN) 1,500 mg in sodium chloride 0.9 % 500 mL IVPB     Thank You, Enis Slipper ,RN Clinical Documentation Specialist:  [phone]  [facility]- Health Information Management

## 2014-08-02 NOTE — Consult Note (Signed)
WOC wound consult note Pt is followed by the wound care center, specifically Dr. Kelly Splinter. Pt with longstanding history of venous stasis ulcers with a history of skin grafts, they have been healed and recently reopened.   Last seen 07/28/14 for debridement and placement of ACell.  She has palpable pulses bilaterally and no significant edema  Reason for Consult:evaluation of LE wounds. Wound type: Leg ulcers, presume venous stasis Pressure Ulcer POA: Yes/No Measurement: L pretibial: 3cm x 2cm x 0.2cm; R pretibial 2cmx 2cm x 0.2cm and 3cm x 2cm x 0.2cm  Wound bed: covered with Acell  Drainage (amount, consistency, odor) minimal  Periwound: intact with scarring from previous skin grafts and LE ulcers Dressing procedure/placement/frequency: Leave non adherent in place over these wounds to preserve ACell if possible, rehydrate with hydrogel daily. kerlix and ACE.

## 2014-08-02 NOTE — Progress Notes (Signed)
ANTIBIOTIC CONSULT NOTE - FOLLOW UP  Pharmacy Consult for Vancomycin Indication: pneumonia  Allergies  Allergen Reactions  . Lisinopril Other (See Comments)    cough    Patient Measurements:  Body Weight: 98kg  Vital Signs: Temp: 100 F (37.8 C) (08/31 0851) Temp src: Oral (08/31 0851) BP: 152/82 mmHg (08/31 1000) Pulse Rate: 78 (08/31 1000) Intake/Output from previous day: 08/30 0701 - 08/31 0700 In: 860 [I.V.:310; IV Piggyback:550] Out: 1150 [Urine:1150] Intake/Output from this shift: Total I/O In: -  Out: 250 [Urine:250]  Labs:  Recent Labs  08/01/14 0530 08/01/14 0851 08/02/14 0600  WBC 13.1* 16.7* 17.2*  HGB 11.4* 11.0* 10.0*  PLT 275 284 253  CREATININE 1.21* 1.28* 1.15*   The CrCl is unknown because both a height and weight (above a minimum accepted value) are required for this calculation. No results found for this basename: VANCOTROUGH, VANCOPEAK, VANCORANDOM, GENTTROUGH, GENTPEAK, GENTRANDOM, TOBRATROUGH, TOBRAPEAK, TOBRARND, AMIKACINPEAK, AMIKACINTROU, AMIKACIN,  in the last 72 hours  ** Calculated CrCl ~ 60.4 mL/min (wt 98, SCr 1.15)**  Microbiology: Recent Results (from the past 720 hour(s))  CULTURE, BLOOD (ROUTINE X 2)     Status: None   Collection Time    08/01/14  5:15 AM      Result Value Ref Range Status   Specimen Description BLOOD RIGHT FOREARM   Final   Special Requests     Final   Value: BOTTLES DRAWN AEROBIC AND ANAEROBIC 3CC BLUE 2CC RED   Culture  Setup Time     Final   Value: 08/01/2014 14:35     Performed at [facility]   Culture     Final   Value:        BLOOD CULTURE RECEIVED NO GROWTH TO DATE CULTURE WILL BE HELD FOR 5 DAYS BEFORE ISSUING A FINAL NEGATIVE REPORT     Performed at [facility]   Report Status PENDING   Incomplete  CULTURE, BLOOD (ROUTINE X 2)     Status: None   Collection Time    08/01/14  5:30 AM      Result Value Ref Range Status   Specimen Description BLOOD RIGHT WRIST   Final   Special  Requests BOTTLES DRAWN AEROBIC ONLY 2CCS   Final   Culture  Setup Time     Final   Value: 08/01/2014 14:35     Performed at [facility]   Culture     Final   Value:        BLOOD CULTURE RECEIVED NO GROWTH TO DATE CULTURE WILL BE HELD FOR 5 DAYS BEFORE ISSUING A FINAL NEGATIVE REPORT     Performed at [facility]   Report Status PENDING   Incomplete  URINE CULTURE     Status: None   Collection Time    08/01/14  6:55 AM      Result Value Ref Range Status   Specimen Description URINE, CATHETERIZED   Final   Special Requests NONE   Final   Culture  Setup Time     Final   Value: 08/01/2014 15:03     Performed at [facility] Count     Final   Value: NO GROWTH     Performed at [facility]   Culture     Final   Value: NO GROWTH     Performed at [facility]   Report Status 08/02/2014 FINAL   Final  MRSA PCR SCREENING  Status: Abnormal   Collection Time    08/01/14  8:52 AM      Result Value Ref Range Status   MRSA by PCR POSITIVE (*) NEGATIVE Final   Comment:            The GeneXpert MRSA Assay (FDA     approved for NASAL specimens     only), is one component of a     comprehensive MRSA colonization     surveillance program. It is not     intended to diagnose MRSA     infection nor to guide or     monitor treatment for     MRSA infections.     RESULT CALLED TO, READ BACK BY AND VERIFIED WITH:     B.WILLIFORD,RN 1132 08/01/14 M.CAMPBELL  CULTURE, EXPECTORATED SPUTUM-ASSESSMENT     Status: None   Collection Time    08/01/14  9:32 AM      Result Value Ref Range Status   Specimen Description SPUTUM   Final   Special Requests NONE   Final   Sputum evaluation     Final   Value: THIS SPECIMEN IS ACCEPTABLE. RESPIRATORY CULTURE REPORT TO FOLLOW.   Report Status 08/01/2014 FINAL   Final  CULTURE, RESPIRATORY (NON-EXPECTORATED)     Status: None   Collection Time    08/01/14  9:32 AM      Result Value Ref Range Status    Specimen Description SPUTUM   Final   Special Requests NONE   Final   Gram Stain PENDING   Incomplete   Culture     Final   Value: Culture reincubated for better growth     Performed at [facility]   Report Status PENDING   Incomplete    Anti-infectives   Start     Dose/Rate Route Frequency Ordered Stop   08/02/14 1718  vancomycin (VANCOCIN) IVPB 750 mg/150 ml premix     750 mg 150 mL/hr over 60 Minutes Intravenous Every 12 hours 08/02/14 1226     08/02/14 0600  vancomycin (VANCOCIN) 1,250 mg in sodium chloride 0.9 % 250 mL IVPB  Status:  Discontinued     1,250 mg 166.7 mL/hr over 90 Minutes Intravenous Every 24 hours 08/01/14 0900 08/02/14 1226   08/01/14 1000  piperacillin-tazobactam (ZOSYN) IVPB 3.375 g     3.375 g 12.5 mL/hr over 240 Minutes Intravenous 3 times per day 08/01/14 0854     08/01/14 0930  aztreonam (AZACTAM) 2 g in dextrose 5 % 50 mL IVPB  Status:  Discontinued     2 g 100 mL/hr over 30 Minutes Intravenous 3 times per day 08/01/14 0804 08/01/14 0854   08/01/14 0515  vancomycin (VANCOCIN) 1,500 mg in sodium chloride 0.9 % 500 mL IVPB     1,500 mg 250 mL/hr over 120 Minutes Intravenous  Once 08/01/14 0509 08/01/14 0750   08/01/14 0515  levofloxacin (LEVAQUIN) IVPB 750 mg     750 mg 100 mL/hr over 90 Minutes Intravenous  Once 08/01/14 0509     08/01/14 0515  piperacillin-tazobactam (ZOSYN) IVPB 3.375 g     3.375 g 100 mL/hr over 30 Minutes Intravenous  Once 08/01/14 0509 08/01/14 0630      Assessment: 80 yo F presented to the ED from home with respiratory distress. Started on vancomycin + zosyn for aspiration pneumonia. SCr 1.15 (improving), WBC increased to 17.2, spiked fever overnight to 101.1 but currently afebrile. Cultures are negative to date.  Goal of Therapy:  Vancomycin trough level 15-20 mcg/ml  Plan:  - Increase Vancomycin to  IV q12h - Continue Zosyn 3.375g IV q8h  - Monitor renal function, clinical progress, and cultures - VT at  steady state if clinically warranted   Thank you for allowing pharmacy to be part of this patient's care team  Raiyan Dalesandro M. Quetzal Meany, Pharm.D Clinical Pharmacy Resident Pager: [phone] 08/02/2014 .12:37 PM

## 2014-08-02 NOTE — Progress Notes (Signed)
Report received from [facility] from [facility] for patient to be transferred into [id number]

## 2014-08-02 NOTE — Evaluation (Signed)
Physical Therapy Evaluation Patient Details Name: [name] MRN: [id number] DOB: 12-Jul-1931 Today's Date: 08/02/2014   History of Present Illness  pt presents with HCAP and hx of HF.    Clinical Impression  Pt very motivated to improve mobility and return to home.  Pt on 4L O2 during session with sats reading 88-90%, though many moments when pulse ox was not reading pt.  RN aware.  Pt ed on UE and LE there ex while in bed and in the recliner.  Pt indicates she has family who can A her at D/C.  Will continue to follow while on acute.      Follow Up Recommendations Home health PT;Supervision - Intermittent    Equipment Recommendations  None recommended by PT    Recommendations for Other Services       Precautions / Restrictions Precautions Precautions: Fall Restrictions Weight Bearing Restrictions: No      Mobility  Bed Mobility Overal bed mobility: Needs Assistance Bed Mobility: Supine to Sit     Supine to sit: Supervision;HOB elevated     General bed mobility comments: pt moves slowly and utilizes bed rails for A.    Transfers Overall transfer level: Needs assistance Equipment used: Rolling walker (2 wheeled) Transfers: Sit to/from Stand Sit to Stand: Min guard         General transfer comment: cues for UE use as pt tends to pull on RW.  demos good use of UE with return to sitting.    Ambulation/Gait Ambulation/Gait assistance: Min guard Ambulation Distance (Feet): 120 Feet Assistive device: Rolling walker (2 wheeled) Gait Pattern/deviations: Step-through pattern;Decreased stride length;Trunk flexed   Gait velocity interpretation: Below normal speed for age/gender General Gait Details: cues for staying closer to RW, upright psoture and encouragement.  pt's pulse ox not reading well during ambulation, but when it did pick up on pt sats were 88-90% on 4L O2.    Stairs            Wheelchair Mobility    Modified Rankin (Stroke Patients Only)        Balance Overall balance assessment: No apparent balance deficits (not formally assessed)                                           Pertinent Vitals/Pain Pain Assessment: No/denies pain    Home Living Family/patient expects to be discharged to:: Private residence Living Arrangements: Alone Available Help at Discharge: Family;Available 24 hours/day Type of Home: Apartment Home Access: Level entry     Home Layout: One level Home Equipment: Walker - 4 wheels;Cane - single point      Prior Function Level of Independence: Independent with assistive device(s)               Hand Dominance        Extremity/Trunk Assessment   Upper Extremity Assessment: Generalized weakness           Lower Extremity Assessment: Generalized weakness      Cervical / Trunk Assessment: Kyphotic  Communication   Communication: No difficulties  Cognition Arousal/Alertness: Awake/alert Behavior During Therapy: WFL for tasks assessed/performed Overall Cognitive Status: Within Functional Limits for tasks assessed                      General Comments      Exercises  Assessment/Plan    PT Assessment Patient needs continued PT services  PT Diagnosis Difficulty walking   PT Problem List Decreased strength;Decreased activity tolerance;Decreased balance;Decreased mobility;Decreased coordination;Decreased knowledge of use of DME;Cardiopulmonary status limiting activity  PT Treatment Interventions DME instruction;Gait training;Functional mobility training;Therapeutic activities;Therapeutic exercise;Balance training;Patient/family education   PT Goals (Current goals can be found in the Care Plan section) Acute Rehab PT Goals Patient Stated Goal: Home. PT Goal Formulation: With patient Time For Goal Achievement: 08/16/14 Potential to Achieve Goals: Good    Frequency Min 3X/week   Barriers to discharge        Co-evaluation                End of Session Equipment Utilized During Treatment: Gait belt;Oxygen Activity Tolerance: Patient tolerated treatment well Patient left: in chair;with call bell/phone within reach;with family/visitor present Nurse Communication: Mobility status         Time: 6387-5643 PT Time Calculation (min): 28 min   Charges:   PT Evaluation $Initial PT Evaluation Tier I: 1 Procedure PT Treatments $Gait Training: 8-22 mins   PT G CodesSunny Schlein, [place] [phone] 08/02/2014, 2:35 PM

## 2014-08-02 NOTE — Progress Notes (Signed)
NURSING PROGRESS NOTE  [name] [id number] Transfer Data: 08/02/2014 7:16 PM Attending Provider: Leroy Sea, MD KPQ:AESLPNP,YYFRTMYT R., MD Code Status: DNR   [name] is a 80 y.o. female patient transferred from 80M -No acute distress noted.  -No complaints of shortness of breath.  -No complaints of chest pain.   Cardiac Monitoring: Box # 11 in place.  Cardiac monitor yields:normal sinus rhythm.  Blood pressure 157/53, pulse 98, temperature 100.1 F (37.8 C), temperature source Oral, resp. rate 20, height 5\' 2"  (1.575 m), SpO2 92.00%.   IV Fluids:  IV in place, occlusive dsg intact without redness, IV cath antecubital right, condition patent and no redness   Allergies:  Lisinopril  Past Medical History:   has a past medical history of Diabetes mellitus; Hypertension; Hyperlipemia; Phlebitis (11/30/11); Skin ulcer(s) (11/30/11); GERD (gastroesophageal reflux disease); Arthritis; Anxiety; Peripheral vascular disease; Neuromuscular disorder; Seizure, convulsive; Cardiomyopathy, EF 35-40% (06/05/2012); Diastolic dysfunction, left ventricle, grade 2 by echo 06/04/12 (06/05/2012); TR (tricuspid regurgitation), mod (06/05/2012); Acute on chronic systolic and diastolic heart failure, NYHA class 2 (06/13/2012); H/O echocardiogram (06/03/2012); History of nuclear stress test (06/29/2008); Wears glasses; and Full dentures.  Past Surgical History:   has past surgical history that includes Abdominal hysterectomy; Cataract extraction w/ intraocular lens  implant, bilateral (1990's); blood clot (1990's); Eye surgery; Incision and drainage of wound (01/21/2012); Skin split graft (02/18/2012); Skin split graft (03/17/2012); Application if wound vac (03/17/2012); Cardiac catheterization (06/06/2012); I&D extremity (Bilateral, 07/28/2014); and Application of a-cell of extremity (Bilateral, 07/28/2014).   Skin:   Patient/Family orientated to room. Information packet given to patient/family. Admission  inpatient armband information verified with patient/family to include name and date of birth and placed on patient arm. Side rails up x 2, fall assessment and education completed with patient/family. Patient/family able to verbalize understanding of risk associated with falls and verbalized understanding to call for assistance before getting out of bed. Call light within reach. Patient/family able to voice and demonstrate understanding of unit orientation instructions.    Will continue to evaluate and treat per MD orders.

## 2014-08-02 NOTE — Progress Notes (Signed)
Pharmacist Heart Failure Core Measure Documentation  Assessment: [name] has an EF documented as 35-40% on 06/03/14 by ECHO.  Rationale: Heart failure patients with left ventricular systolic dysfunction (LVSD) and an EF < 40% should be prescribed an angiotensin converting enzyme inhibitor (ACEI) or angiotensin receptor blocker (ARB) at discharge unless a contraindication is documented in the medical record.  This patient is not currently on an ACEI or ARB for HF.  This note is being placed in the record in order to provide documentation that a contraindication to the use of these agents is present for this encounter.  ACE Inhibitor or Angiotensin Receptor Blocker is contraindicated (specify all that apply)  []   ACEI allergy AND ARB allergy []   Angioedema []   Moderate or severe aortic stenosis []   Hyperkalemia []   Hypotension []   Renal artery stenosis [x]   Worsening renal function, preexisting renal disease or dysfunction   *Note- Renal function improving, patient was on Losartan prior to admission. Please consider restart Losartan as deem appropriate.   Fayne Norrie 08/02/2014 2:01 PM

## 2014-08-02 NOTE — Evaluation (Signed)
Clinical/Bedside Swallow Evaluation Patient Details  Name: [name] MRN: [id number] Date of Birth: [birth year]  Today's Date: 08/02/2014 Time: 6578-4696 SLP Time Calculation (min): 13 min  Past Medical History:  Past Medical History  Diagnosis Date  . Diabetes mellitus   . Hypertension   . Hyperlipemia   . Phlebitis 11/30/11    RLE  . Skin ulcer(s) 11/30/11    BLE; draining  . GERD (gastroesophageal reflux disease)   . Arthritis   . Anxiety   . Peripheral vascular disease   . Neuromuscular disorder     numbness feet  . Seizure, convulsive   . Cardiomyopathy, EF 35-40% 06/05/2012  . Diastolic dysfunction, left ventricle, grade 2 by echo 06/04/12 06/05/2012  . TR (tricuspid regurgitation), mod 06/05/2012  . Acute on chronic systolic and diastolic heart failure, NYHA class 2 06/13/2012  . H/O echocardiogram 06/03/2012    EF 35-40%; systolic function moderately reduced; grade 2 diastolic dysfunction; mild AV regurg.; mod MV regurg.; LA severly dilated; mild-mod tricuspid valve regurg (acute care setting)  . History of nuclear stress test 06/29/2008    normal pattern of perfusion; negative for ischemia; low risk scan  . Wears glasses   . Full dentures    Past Surgical History:  Past Surgical History  Procedure Laterality Date  . Abdominal hysterectomy    . Cataract extraction w/ intraocular lens  implant, bilateral  1990's  . Blood clot  1990's    RLE; "cut out"  . Eye surgery    . Incision and drainage of wound  01/21/2012    Procedure: IRRIGATION AND DEBRIDEMENT WOUND;  Surgeon: Wayland Denis, DO;  Location: [facility];  Service: Plastics;  Laterality: Bilateral;  I&D bilateral lower extremities and placement of wound vac left lower leg  . Skin split graft  02/18/2012    Procedure: SKIN GRAFT SPLIT THICKNESS;  Surgeon: Wayland Denis, DO;  Location: [facility];  Service: Plastics;  Laterality: Bilateral;  irrigation bilateral lower extremities, ACell  placement bilateral lower legs, Wound Vac placement left lower leg  . Skin split graft  03/17/2012    Procedure: SKIN GRAFT SPLIT THICKNESS;  Surgeon: Wayland Denis, DO;  Location: [facility];  Service: Plastics;  Laterality: Left;  LEFT LEG SKIN GRAFT WITH SPLIT THICKNESS   . Application of wound vac  03/17/2012    Procedure: APPLICATION OF WOUND VAC;  Surgeon: Wayland Denis, DO;  Location: [facility];  Service: Plastics;  Laterality: Left;  open ulcer  . Cardiac catheterization  06/06/2012    EF 35-40% non-ischemia cardiomyopathy; no significant CAD (acute care setting)  . I&d extremity Bilateral 07/28/2014    Procedure: BILATERAL IRRIGATION AND DEBRIDEMENT LOWER LEGS/ACELL PLACEMENT;  Surgeon: Wayland Denis, DO;  Location: [facility];  Service: Plastics;  Laterality: Bilateral;  . Application of a-cell of extremity Bilateral 07/28/2014    Procedure: APPLICATION OF A-CELL OF EXTREMITY;  Surgeon: Wayland Denis, DO;  Location: [facility];  Service: Plastics;  Laterality: Bilateral;   HPI:  80 year old female with PMH of GERD, HTN, seizure, DM, admitted with Acute respiratory failure due to aspiration HCAP with sepsis following an episode of vomitting at home.    Assessment / Plan / Recommendation Clinical Impression  Swallow eval complete. Oropharyngeal swallow appears intact without evidence of aspiration or penetration. Suspect that aspiration occurred during vomitting episode and is not a result of a dysphagia. If aspiration PNA, particularly RLL PNAs, become more frequent, instrumental testing of  swallowing may be considered. No f/u SLP needs indicated at this time.    Aspiration Risk       Diet Recommendation Regular;Thin liquid   Liquid Administration via: Cup;Straw Medication Administration: Whole meds with liquid Supervision: Patient able to self feed Compensations: Slow rate;Small sips/bites Postural Changes and/or Swallow Maneuvers: Seated upright 90 degrees     Other  Recommendations Oral Care Recommendations: Oral care BID   Follow Up Recommendations  None       Pertinent Vitals/Pain n/a     Swallow Study Prior Functional Status  Type of Home: Apartment Available Help at Discharge: Family;Available 24 hours/day    General HPI: 80 year old female with PMH of GERD, HTN, seizure, DM, admitted with Acute respiratory failure due to aspiration HCAP with sepsis following an episode of vomitting at home.  Type of Study: Bedside swallow evaluation Previous Swallow Assessment: barium swallow in 2008 noted small hiatal hernia Diet Prior to this Study: Regular;Thin liquids Temperature Spikes Noted: No Respiratory Status: Nasal cannula History of Recent Intubation: No Behavior/Cognition: Alert;Cooperative;Pleasant mood Oral Cavity - Dentition: Dentures, top;Dentures, bottom Self-Feeding Abilities: Able to feed self Patient Positioning: Upright in bed Baseline Vocal Quality: Clear Volitional Cough: Strong Volitional Swallow: Able to elicit    Oral/Motor/Sensory Function Overall Oral Motor/Sensory Function: Appears within functional limits for tasks assessed   Ice Chips Ice chips: Not tested   Thin Liquid Thin Liquid: Within functional limits Presentation: Cup;Straw;Self Fed    Nectar Thick Nectar Thick Liquid: Not tested   Honey Thick Honey Thick Liquid: Not tested   Puree Puree: Within functional limits Presentation: Self Fed   Solid   GO    Solid: Within functional limits Presentation: Self Fed      Beverlyann Broxterman MA, CCC-SLP [phone]  Rene Gonsoulin Meryl 08/02/2014,2:58 PM

## 2014-08-02 NOTE — Progress Notes (Signed)
Patient Demographics  [name], is a 80 y.o. female, DOB - [birth year], RUE:[id number]  Admit date - 08/01/2014   Admitting Physician Lynden Oxford, MD  Outpatient Primary MD for the patient is [name]., MD  LOS - 1   Chief Complaint  Patient presents with  . Respiratory Distress        Subjective:   [name] today has, No headache, No chest pain, No abdominal pain - No Nausea, No new weakness tingling or numbness, much improved Cough - SOB.   Assessment & Plan    1. Acute respiratory failure due to aspiration HCAP with Sepsis. Much better move out of step down unit, follow blood and sputum cultures, continue empiric IV antibiotics which will include vancomycin and Zosyn. Currently on 2 L nasal cannula oxygen along with nebulizer treatments as needed. Pending speech therapy evaluation. Monitor pro calcitonin levels .     2. Chronic systolic and diastolic heart failure with last EF 35-40% on echogram -   blood pressuresoft, she was septic, we'll continue to hold Lasix along with hydralazine. Low dose B blocker as tolerated. Stop IVF now and monitor.   3. Chronic venous stasis ulcers. Recent surgery by Dr. Helyn Numbers - continue UNA boots. Wound care consult. Not on antibiotics prior to admission. Continue zinc and vitamin supplementation.     4. ARF due to #1 above. resolved with IVF, avoid Nephro toxins.     5. GERD. Continue PPI.     6. Dyslipidemia. Continue home dose statin.    7.DM2- ISS, hold Glucophage   Lab Results  Component Value Date   HGBA1C 5.6 08/01/2014    CBG (last 3)   Recent Labs  08/01/14 2353 08/02/14 0358 08/02/14 0805  GLUCAP 149* 125* 129*         Code Status: DNR  Family Communication: daughters  Disposition Plan:  TBD   Procedures     Consults  Speech   Medications  Scheduled Meds: . acidophilus  1 capsule Oral Daily  . albuterol  2.5 mg Nebulization BID  . aspirin  81 mg Oral Daily  . calcium-vitamin D  1 tablet Oral Daily  . Chlorhexidine Gluconate Cloth  6 each Topical Q0600  . heparin  5,000 Units Subcutaneous 3 times per day  . hydrALAZINE  25 mg Oral BID  . insulin aspart  0-5 Units Subcutaneous QHS  . insulin aspart  0-9 Units Subcutaneous TID WC  . levofloxacin (LEVAQUIN) IV  750 mg Intravenous Once  . metoprolol  25 mg Oral BID  . mupirocin ointment  1 application Nasal BID  . omega-3 acid ethyl esters  2 g Oral Daily  . pantoprazole  80 mg Oral BID  . piperacillin-tazobactam (ZOSYN)  IV  3.375 g Intravenous 3 times per day  . simvastatin  20 mg Oral QPM  . vancomycin  1,250 mg Intravenous Q24H  . vitamin C  500 mg Oral Daily  . zinc sulfate  220 mg Oral Daily   Continuous Infusions:  PRN Meds:.albuterol, guaiFENesin-dextromethorphan, HYDROcodone-acetaminophen, nitroGLYCERIN, ondansetron (ZOFRAN) IV, ondansetron, polyethylene glycol, sodium chloride  DVT Prophylaxis   Heparin    Lab Results  Component Value Date   PLT 253 08/02/2014    Antibiotics  Anti-infectives   Start     Dose/Rate Route Frequency Ordered Stop   08/02/14 0600  vancomycin (VANCOCIN) 1,250 mg in sodium chloride 0.9 % 250 mL IVPB     1,250 mg 166.7 mL/hr over 90 Minutes Intravenous Every 24 hours 08/01/14 0900     08/01/14 1000  piperacillin-tazobactam (ZOSYN) IVPB 3.375 g     3.375 g 12.5 mL/hr over 240 Minutes Intravenous 3 times per day 08/01/14 0854     08/01/14 0930  aztreonam (AZACTAM) 2 g in dextrose 5 % 50 mL IVPB  Status:  Discontinued     2 g 100 mL/hr over 30 Minutes Intravenous 3 times per day 08/01/14 0804 08/01/14 0854   08/01/14 0515  vancomycin (VANCOCIN) 1,500 mg in sodium chloride 0.9 % 500 mL IVPB     1,500 mg 250 mL/hr over 120 Minutes Intravenous  Once 08/01/14 0509  08/01/14 0750   08/01/14 0515  levofloxacin (LEVAQUIN) IVPB 750 mg     750 mg 100 mL/hr over 90 Minutes Intravenous  Once 08/01/14 0509     08/01/14 0515  piperacillin-tazobactam (ZOSYN) IVPB 3.375 g     3.375 g 100 mL/hr over 30 Minutes Intravenous  Once 08/01/14 0509 08/01/14 0630          Objective:   Filed Vitals:   08/01/14 2200 08/01/14 2300 08/02/14 0009 08/02/14 0422  BP: 126/57 127/42    Pulse: 70 73    Temp:   98.1 F (36.7 C) 101.1 F (38.4 C)  TempSrc:   Oral Oral  Resp: 20 17    SpO2: 100% 98%      Wt Readings from Last 3 Encounters:  07/28/14 98.034 kg (216 lb 2 oz)  07/28/14 98.034 kg (216 lb 2 oz)  05/04/14 94.348 kg (208 lb)     Intake/Output Summary (Last 24 hours) at 08/02/14 0848 Last data filed at 08/01/14 2200  Gross per 24 hour  Intake    510 ml  Output    900 ml  Net   -390 ml     Physical Exam  Awake Alert, Oriented X 3, No new F.N deficits, Normal affect [place].AT,PERRAL Supple Neck,No JVD, No cervical lymphadenopathy appriciated.  Symmetrical Chest wall movement, Good air movement bilaterally, ++ R.sided rales RRR,No Gallops,Rubs or new Murmurs, No Parasternal Heave +ve B.Sounds, Abd Soft, No tenderness, No organomegaly appriciated, No rebound - guarding or rigidity. No Cyanosis, Clubbing or edema, No new Rash or bruise    Data Review   Micro Results Recent Results (from the past 240 hour(s))  CULTURE, BLOOD (ROUTINE X 2)     Status: None   Collection Time    08/01/14  5:15 AM      Result Value Ref Range Status   Specimen Description BLOOD RIGHT FOREARM   Final   Special Requests     Final   Value: BOTTLES DRAWN AEROBIC AND ANAEROBIC 3CC BLUE 2CC RED   Culture  Setup Time     Final   Value: 08/01/2014 14:35     Performed at [facility]   Culture     Final   Value:        BLOOD CULTURE RECEIVED NO GROWTH TO DATE CULTURE WILL BE HELD FOR 5 DAYS BEFORE ISSUING A FINAL NEGATIVE REPORT     Performed at [facility]   Report Status PENDING   Incomplete  CULTURE, BLOOD (ROUTINE X 2)     Status: None   Collection Time  08/01/14  5:30 AM      Result Value Ref Range Status   Specimen Description BLOOD RIGHT WRIST   Final   Special Requests BOTTLES DRAWN AEROBIC ONLY 2CCS   Final   Culture  Setup Time     Final   Value: 08/01/2014 14:35     Performed at [facility]   Culture     Final   Value:        BLOOD CULTURE RECEIVED NO GROWTH TO DATE CULTURE WILL BE HELD FOR 5 DAYS BEFORE ISSUING A FINAL NEGATIVE REPORT     Performed at [facility]   Report Status PENDING   Incomplete  MRSA PCR SCREENING     Status: Abnormal   Collection Time    08/01/14  8:52 AM      Result Value Ref Range Status   MRSA by PCR POSITIVE (*) NEGATIVE Final   Comment:            The GeneXpert MRSA Assay (FDA     approved for NASAL specimens     only), is one component of a     comprehensive MRSA colonization     surveillance program. It is not     intended to diagnose MRSA     infection nor to guide or     monitor treatment for     MRSA infections.     RESULT CALLED TO, READ BACK BY AND VERIFIED WITH:     B.WILLIFORD,RN 1132 08/01/14 M.CAMPBELL  CULTURE, EXPECTORATED SPUTUM-ASSESSMENT     Status: None   Collection Time    08/01/14  9:32 AM      Result Value Ref Range Status   Specimen Description SPUTUM   Final   Special Requests NONE   Final   Sputum evaluation     Final   Value: THIS SPECIMEN IS ACCEPTABLE. RESPIRATORY CULTURE REPORT TO FOLLOW.   Report Status 08/01/2014 FINAL   Final    Radiology Reports Dg Chest 2 View  08/01/2014   CLINICAL DATA:  Respiratory distress.  EXAM: CHEST  2 VIEW  COMPARISON:  Chest radiograph July 29, 2013  FINDINGS: Patchy dense consolidation throughout right lung. Left lung is clear. No pleural effusions.  Cardiac silhouette appears mildly enlarged, mediastinal silhouette is nonsuspicious, mildly calcified aortic knob no pneumothorax.  Soft tissue  planes and included osseous structures are nonsuspicious. Degenerative change of the thoracic spine and acromioclavicular joints.  IMPRESSION: Dense consolidations throughout the right lung concerning for pneumonia. Recommend followup chest radiograph after treatment to verify improvement.  Stable mild cardiomegaly.   Electronically Signed   By: Awilda Metro   On: 08/01/2014 05:04   Dg Chest Port 1 View  08/02/2014   CLINICAL DATA:  Shortness of breath.  EXAM: PORTABLE CHEST - 1 VIEW  COMPARISON:  08/01/2014  FINDINGS: There continues to be patchy airspace densities in the right lung. Left lung is clear. Heart size is upper limits of normal but unchanged. Negative for a pneumothorax.  IMPRESSION: Persistent airspace disease in the right lung. Findings are suggestive for pneumonia. Minimal change from the previous examination. Recommend follow-up to ensure resolution.   Electronically Signed   By: Richarda Overlie M.D.   On: 08/02/2014 07:38   Dg Abd Portable 1v  08/01/2014   CLINICAL DATA:  Nausea, vomiting and diarrhea  EXAM: PORTABLE ABDOMEN - 1 VIEW  COMPARISON:  Chest x-ray obtained earlier today  FINDINGS: Unremarkable bowel gas pattern. Gas is noted distally  to the level of the rectum. Air is also present with an loops of nondilated small bowel in the mid abdomen. This is a nonspecific finding. Multilevel degenerative change throughout the visualized spine. Mild bilateral hip joint osteoarthritis. No acute osseous abnormality.  IMPRESSION: Nonobstructed bowel gas pattern.   Electronically Signed   By: Malachy Moan M.D.   On: 08/01/2014 09:13     CBC  Recent Labs Lab 07/28/14 0733 08/01/14 0530 08/01/14 0851 08/02/14 0600  WBC  --  13.1* 16.7* 17.2*  HGB 12.2 11.4* 11.0* 10.0*  HCT 36.0 37.1 35.1* 32.7*  PLT  --  275 284 253  MCV  --  76.7* 76.8* 76.8*  MCH  --  23.6* 24.1* 23.5*  MCHC  --  30.7 31.3 30.6  RDW  --  16.0* 16.0* 16.1*  LYMPHSABS  --  0.9  --   --   MONOABS  --  0.6   --   --   EOSABS  --  0.0  --   --   BASOSABS  --  0.0  --   --     Chemistries   Recent Labs Lab 07/28/14 0733 08/01/14 0530 08/01/14 0851 08/02/14 0600  NA 139 136*  --  140  K 4.6 4.0  --  4.4  CL 103 99  --  103  CO2  --  21  --  25  GLUCOSE 108* 125*  --  121*  BUN 23 23  --  20  CREATININE 1.10 1.21* 1.28* 1.15*  CALCIUM  --  8.9  --  8.5  AST  --  20  --   --   ALT  --  9  --   --   ALKPHOS  --  66  --   --   BILITOT  --  0.5  --   --    ------------------------------------------------------------------------------------------------------------------ CrCl is unknown because both a height and weight (above a minimum accepted value) are required for this calculation. ------------------------------------------------------------------------------------------------------------------  Recent Labs  08/01/14 0851  HGBA1C 5.6   ------------------------------------------------------------------------------------------------------------------ No results found for this basename: CHOL, HDL, LDLCALC, TRIG, CHOLHDL, LDLDIRECT,  in the last 72 hours ------------------------------------------------------------------------------------------------------------------ No results found for this basename: TSH, T4TOTAL, FREET3, T3FREE, THYROIDAB,  in the last 72 hours ------------------------------------------------------------------------------------------------------------------ No results found for this basename: VITAMINB12, FOLATE, FERRITIN, TIBC, IRON, RETICCTPCT,  in the last 72 hours  Coagulation profile  Recent Labs Lab 08/01/14 0530  INR 1.13    No results found for this basename: DDIMER,  in the last 72 hours  Cardiac Enzymes  Recent Labs Lab 08/01/14 0530  TROPONINI <0.30   ------------------------------------------------------------------------------------------------------------------ No components found with this basename: POCBNP,      Time Spent in  minutes  35   SINGH,PRASHANT K M.D on 08/02/2014 at 8:48 AM  Between 7am to 7pm - Pager - [phone]  After 7pm go to [email or web address] - password [email or web address]  And look for the night coverage person covering for me after hours  [facility] Office  [phone]   **Disclaimer: This note may have been dictated with voice recognition software. Similar sounding words can inadvertently be transcribed and this note may contain transcription errors which may not have been corrected upon publication of note.**

## 2014-08-03 LAB — BASIC METABOLIC PANEL
ANION GAP: 10 (ref 5–15)
BUN: 13 mg/dL (ref 6–23)
CALCIUM: 8.4 mg/dL (ref 8.4–10.5)
CO2: 24 mEq/L (ref 19–32)
CREATININE: 0.84 mg/dL (ref 0.50–1.10)
Chloride: 101 mEq/L (ref 96–112)
GFR calc non Af Amer: 64 mL/min — ABNORMAL LOW (ref >90)
GFR, EST AFRICAN AMERICAN: 74 mL/min — AB (ref >90)
Glucose, Bld: 148 mg/dL — ABNORMAL HIGH (ref 70–99)
Potassium: 4.6 mEq/L (ref 3.7–5.3)
Sodium: 135 mEq/L — ABNORMAL LOW (ref 137–147)

## 2014-08-03 LAB — GLUCOSE, CAPILLARY
GLUCOSE-CAPILLARY: 155 mg/dL — AB (ref 70–99)
Glucose-Capillary: 160 mg/dL — ABNORMAL HIGH (ref 70–99)
Glucose-Capillary: 160 mg/dL — ABNORMAL HIGH (ref 70–99)
Glucose-Capillary: 150 mg/dL — ABNORMAL HIGH (ref 70–99)

## 2014-08-03 LAB — CBC
HCT: 27.7 % — ABNORMAL LOW (ref 36.0–46.0)
Hemoglobin: 8.7 g/dL — ABNORMAL LOW (ref 12.0–15.0)
MCH: 24 pg — AB (ref 26.0–34.0)
MCHC: 31.4 g/dL (ref 30.0–36.0)
MCV: 76.3 fL — AB (ref 78.0–100.0)
PLATELETS: 264 10*3/uL (ref 150–400)
RBC: 3.63 MIL/uL — ABNORMAL LOW (ref 3.87–5.11)
RDW: 16.1 % — AB (ref 11.5–15.5)
WBC: 14.2 10*3/uL — AB (ref 4.0–10.5)

## 2014-08-03 LAB — CULTURE, RESPIRATORY W GRAM STAIN

## 2014-08-03 LAB — CULTURE, RESPIRATORY: CULTURE: NORMAL

## 2014-08-03 LAB — PROCALCITONIN: Procalcitonin: 28.9 ng/mL

## 2014-08-03 MED ORDER — FUROSEMIDE 20 MG PO TABS
20.0000 mg | ORAL_TABLET | Freq: Every day | ORAL | Status: DC
  Administered 2014-08-03 – 2014-08-04 (×2): 20 mg via ORAL
  Filled 2014-08-03 (×2): qty 1

## 2014-08-03 NOTE — Progress Notes (Signed)
Patient Demographics  [name], is a 80 y.o. female, DOB - 03-10-1931, NZV:[id number]  Admit date - 08/01/2014   Admitting Physician Lynden Oxford, MD  Outpatient Primary MD for the patient is [name]., MD  LOS - 2   Chief Complaint  Patient presents with  . Respiratory Distress        Subjective:   [name] today has, No headache, No chest pain, No abdominal pain - No Nausea, No new weakness tingling or numbness, much improved Cough - SOB.   Assessment & Plan    1. Acute respiratory failure due to aspiration HCAP with Sepsis. Much better and moved out of step down unit, follow blood and sputum cultures so far all negative, continue empiric IV vancomycin and Zosyn. Currently on 2 L nasal cannula oxygen along with nebulizer treatments as needed. Seen by speech placed on regular diet now. He became much better.    2. Chronic systolic and diastolic heart failure with last EF 35-40% on echogram - currently compensated, currently on combination of low-dose ARB, hydralazine, beta blocker. Since renal function is back to baseline we'll place her on home dose Lasix.    3. Chronic venous stasis ulcers. Recent surgery by Dr. Helyn Numbers - continue UNA boots. Wound care consult. Not on antibiotics prior to admission. Continue zinc and vitamin supplementation.     4. ARF due to #1 above. resolved with IVF.    5. GERD. Continue PPI.     6. Dyslipidemia. Continue home dose statin.    7.DM2- ISS, hold Glucophage   Lab Results  Component Value Date   HGBA1C 5.6 08/01/2014    CBG (last 3)   Recent Labs  08/02/14 1610 08/02/14 2129 08/03/14 0802  GLUCAP 146* 174* 160*         Code Status: DNR  Family Communication: daughters  Disposition Plan:  TBD   Procedures     Consults  Speech   Medications  Scheduled Meds: . acidophilus  1 capsule Oral Daily  . albuterol  2.5 mg Nebulization BID  . aspirin  81 mg Oral Daily  . calcium-vitamin D  1 tablet Oral Daily  . Chlorhexidine Gluconate Cloth  6 each Topical Q0600  . heparin  5,000 Units Subcutaneous 3 times per day  . hydrALAZINE  25 mg Oral BID  . insulin aspart  0-5 Units Subcutaneous QHS  . insulin aspart  0-9 Units Subcutaneous TID WC  . levofloxacin (LEVAQUIN) IV  750 mg Intravenous Once  . losartan  12.5 mg Oral Daily  . metoprolol  50 mg Oral BID  . mupirocin ointment  1 application Nasal BID  . omega-3 acid ethyl esters  2 g Oral Daily  . pantoprazole  80 mg Oral BID  . piperacillin-tazobactam (ZOSYN)  IV  3.375 g Intravenous 3 times per day  . simvastatin  20 mg Oral QPM  . vancomycin  750 mg Intravenous Q12H  . vitamin C  500 mg Oral Daily  . zinc sulfate  220 mg Oral Daily   Continuous Infusions:  PRN Meds:.albuterol, guaiFENesin-dextromethorphan, HYDROcodone-acetaminophen, nitroGLYCERIN, ondansetron (ZOFRAN) IV, polyethylene glycol, sodium chloride  DVT Prophylaxis   Heparin    Lab Results  Component Value Date   PLT 264 08/03/2014  Antibiotics   Anti-infectives   Start     Dose/Rate Route Frequency Ordered Stop   08/02/14 1718  vancomycin (VANCOCIN) IVPB 750 mg/150 ml premix     750 mg 150 mL/hr over 60 Minutes Intravenous Every 12 hours 08/02/14 1226     08/02/14 0600  vancomycin (VANCOCIN) 1,250 mg in sodium chloride 0.9 % 250 mL IVPB  Status:  Discontinued     1,250 mg 166.7 mL/hr over 90 Minutes Intravenous Every 24 hours 08/01/14 0900 08/02/14 1226   08/01/14 1000  piperacillin-tazobactam (ZOSYN) IVPB 3.375 g     3.375 g 12.5 mL/hr over 240 Minutes Intravenous 3 times per day 08/01/14 0854     08/01/14 0930  aztreonam (AZACTAM) 2 g in dextrose 5 % 50 mL IVPB  Status:  Discontinued     2 g 100 mL/hr over 30 Minutes Intravenous 3 times  per day 08/01/14 0804 08/01/14 0854   08/01/14 0515  vancomycin (VANCOCIN) 1,500 mg in sodium chloride 0.9 % 500 mL IVPB     1,500 mg 250 mL/hr over 120 Minutes Intravenous  Once 08/01/14 0509 08/01/14 0750   08/01/14 0515  levofloxacin (LEVAQUIN) IVPB 750 mg     750 mg 100 mL/hr over 90 Minutes Intravenous  Once 08/01/14 0509     08/01/14 0515  piperacillin-tazobactam (ZOSYN) IVPB 3.375 g     3.375 g 100 mL/hr over 30 Minutes Intravenous  Once 08/01/14 0509 08/01/14 0630          Objective:   Filed Vitals:   08/02/14 2306 08/03/14 0209 08/03/14 0536 08/03/14 0833  BP: 136/62  153/78   Pulse:   77   Temp:   98.1 F (36.7 C)   TempSrc:   Oral   Resp:   20   Height:      Weight:  101.787 kg (224 lb 6.4 oz)    SpO2:   97% 97%    Wt Readings from Last 3 Encounters:  08/03/14 101.787 kg (224 lb 6.4 oz)  07/28/14 98.034 kg (216 lb 2 oz)  07/28/14 98.034 kg (216 lb 2 oz)     Intake/Output Summary (Last 24 hours) at 08/03/14 1006 Last data filed at 08/02/14 2217  Gross per 24 hour  Intake    230 ml  Output    750 ml  Net   -520 ml     Physical Exam  Awake Alert, Oriented X 3, No new F.N deficits, Normal affect [place].AT,PERRAL Supple Neck,No JVD, No cervical lymphadenopathy appriciated.  Symmetrical Chest wall movement, Good air movement bilaterally, ++ R.sided rales RRR,No Gallops,Rubs or new Murmurs, No Parasternal Heave +ve B.Sounds, Abd Soft, No tenderness, No organomegaly appriciated, No rebound - guarding or rigidity. No Cyanosis, Clubbing or edema, No new Rash or bruise    Data Review   Micro Results Recent Results (from the past 240 hour(s))  CULTURE, BLOOD (ROUTINE X 2)     Status: None   Collection Time    08/01/14  5:15 AM      Result Value Ref Range Status   Specimen Description BLOOD RIGHT FOREARM   Final   Special Requests     Final   Value: BOTTLES DRAWN AEROBIC AND ANAEROBIC 3CC BLUE 2CC RED   Culture  Setup Time     Final   Value: 08/01/2014  14:35     Performed at [facility]   Culture     Final   Value:  BLOOD CULTURE RECEIVED NO GROWTH TO DATE CULTURE WILL BE HELD FOR 5 DAYS BEFORE ISSUING A FINAL NEGATIVE REPORT     Performed at [facility]   Report Status PENDING   Incomplete  CULTURE, BLOOD (ROUTINE X 2)     Status: None   Collection Time    08/01/14  5:30 AM      Result Value Ref Range Status   Specimen Description BLOOD RIGHT WRIST   Final   Special Requests BOTTLES DRAWN AEROBIC ONLY 2CCS   Final   Culture  Setup Time     Final   Value: 08/01/2014 14:35     Performed at [facility]   Culture     Final   Value:        BLOOD CULTURE RECEIVED NO GROWTH TO DATE CULTURE WILL BE HELD FOR 5 DAYS BEFORE ISSUING A FINAL NEGATIVE REPORT     Performed at [facility]   Report Status PENDING   Incomplete  URINE CULTURE     Status: None   Collection Time    08/01/14  6:55 AM      Result Value Ref Range Status   Specimen Description URINE, CATHETERIZED   Final   Special Requests NONE   Final   Culture  Setup Time     Final   Value: 08/01/2014 15:03     Performed at [facility] Count     Final   Value: NO GROWTH     Performed at [facility]   Culture     Final   Value: NO GROWTH     Performed at [facility]   Report Status 08/02/2014 FINAL   Final  MRSA PCR SCREENING     Status: Abnormal   Collection Time    08/01/14  8:52 AM      Result Value Ref Range Status   MRSA by PCR POSITIVE (*) NEGATIVE Final   Comment:            The GeneXpert MRSA Assay (FDA     approved for NASAL specimens     only), is one component of a     comprehensive MRSA colonization     surveillance program. It is not     intended to diagnose MRSA     infection nor to guide or     monitor treatment for     MRSA infections.     RESULT CALLED TO, READ BACK BY AND VERIFIED WITH:     B.WILLIFORD,RN 1132 08/01/14 M.CAMPBELL  CULTURE, EXPECTORATED SPUTUM-ASSESSMENT      Status: None   Collection Time    08/01/14  9:32 AM      Result Value Ref Range Status   Specimen Description SPUTUM   Final   Special Requests NONE   Final   Sputum evaluation     Final   Value: THIS SPECIMEN IS ACCEPTABLE. RESPIRATORY CULTURE REPORT TO FOLLOW.   Report Status 08/01/2014 FINAL   Final  CULTURE, RESPIRATORY (NON-EXPECTORATED)     Status: None   Collection Time    08/01/14  9:32 AM      Result Value Ref Range Status   Specimen Description SPUTUM   Final   Special Requests NONE   Final   Gram Stain     Final   Value: FEW WBC PRESENT,BOTH PMN AND MONONUCLEAR     RARE SQUAMOUS EPITHELIAL CELLS PRESENT     RARE Romie Minus  POSITIVE COCCI     IN PAIRS     Performed at [facility]   Culture     Final   Value: NORMAL OROPHARYNGEAL FLORA     Performed at [facility]   Report Status 08/03/2014 FINAL   Final    Radiology Reports Dg Chest Port 1 View  08/02/2014   CLINICAL DATA:  Shortness of breath.  EXAM: PORTABLE CHEST - 1 VIEW  COMPARISON:  08/01/2014  FINDINGS: There continues to be patchy airspace densities in the right lung. Left lung is clear. Heart size is upper limits of normal but unchanged. Negative for a pneumothorax.  IMPRESSION: Persistent airspace disease in the right lung. Findings are suggestive for pneumonia. Minimal change from the previous examination. Recommend follow-up to ensure resolution.   Electronically Signed   By: Richarda Overlie M.D.   On: 08/02/2014 07:38     CBC  Recent Labs Lab 07/28/14 0733 08/01/14 0530 08/01/14 0851 08/02/14 0600 08/03/14 0844  WBC  --  13.1* 16.7* 17.2* 14.2*  HGB 12.2 11.4* 11.0* 10.0* 8.7*  HCT 36.0 37.1 35.1* 32.7* 27.7*  PLT  --  275 284 253 264  MCV  --  76.7* 76.8* 76.8* 76.3*  MCH  --  23.6* 24.1* 23.5* 24.0*  MCHC  --  30.7 31.3 30.6 31.4  RDW  --  16.0* 16.0* 16.1* 16.1*  LYMPHSABS  --  0.9  --   --   --   MONOABS  --  0.6  --   --   --   EOSABS  --  0.0  --   --   --   BASOSABS  --  0.0   --   --   --     Chemistries   Recent Labs Lab 07/28/14 0733 08/01/14 0530 08/01/14 0851 08/02/14 0600 08/03/14 0844  NA 139 136*  --  140 135*  K 4.6 4.0  --  4.4 4.6  CL 103 99  --  103 101  CO2  --  21  --  25 24  GLUCOSE 108* 125*  --  121* 148*  BUN 23 23  --  20 13  CREATININE 1.10 1.21* 1.28* 1.15* 0.84  CALCIUM  --  8.9  --  8.5 8.4  AST  --  20  --   --   --   ALT  --  9  --   --   --   ALKPHOS  --  66  --   --   --   BILITOT  --  0.5  --   --   --    ------------------------------------------------------------------------------------------------------------------ estimated creatinine clearance is 59.7 ml/min (by C-G formula based on Cr of 0.84). ------------------------------------------------------------------------------------------------------------------  Recent Labs  08/01/14 0851  HGBA1C 5.6   ------------------------------------------------------------------------------------------------------------------ No results found for this basename: CHOL, HDL, LDLCALC, TRIG, CHOLHDL, LDLDIRECT,  in the last 72 hours ------------------------------------------------------------------------------------------------------------------ No results found for this basename: TSH, T4TOTAL, FREET3, T3FREE, THYROIDAB,  in the last 72 hours ------------------------------------------------------------------------------------------------------------------ No results found for this basename: VITAMINB12, FOLATE, FERRITIN, TIBC, IRON, RETICCTPCT,  in the last 72 hours  Coagulation profile  Recent Labs Lab 08/01/14 0530  INR 1.13    No results found for this basename: DDIMER,  in the last 72 hours  Cardiac Enzymes  Recent Labs Lab 08/01/14 0530  TROPONINI <0.30   ------------------------------------------------------------------------------------------------------------------ No components found with this basename: POCBNP,      Time Spent in minutes   35  Leroy Sea M.D on 08/03/2014 at 10:06 AM  Between 7am to 7pm - Pager - [phone]  After 7pm go to [email or web address] - password [email or web address]  And look for the night coverage person covering for me after hours  [facility] Office  [phone]   **Disclaimer: This note may have been dictated with voice recognition software. Similar sounding words can inadvertently be transcribed and this note may contain transcription errors which may not have been corrected upon publication of note.**

## 2014-08-03 NOTE — Progress Notes (Signed)
Physical Therapy Treatment Patient Details Name: [name] MRN: [id number] DOB: Feb 28, 1931 Today's Date: 08/03/2014    History of Present Illness pt presents with HCAP and hx of HF.      PT Comments    Patient progressing well with mobility and tolerated gait training today on RA while maintaining Sa02 >90%. Balance seems to be improving during gait. Continues to require assist to stand from lower surfaces due to weakness in BLEs and fatigues easily. Will continue to follow and progress as tolerated.   Follow Up Recommendations  Home health PT;Supervision - Intermittent     Equipment Recommendations  None recommended by PT    Recommendations for Other Services       Precautions / Restrictions Precautions Precautions: Fall Restrictions Weight Bearing Restrictions: No    Mobility  Bed Mobility               General bed mobility comments: Received sitting in chair with family present upon arrival.  Transfers Overall transfer level: Needs assistance Equipment used: Rolling walker (2 wheeled) Transfers: Sit to/from Stand Sit to Stand: Min guard;Min assist         General transfer comment: Required Min A to stand from low toilet with use of grab bar for assist. Able to stand from recliner with Min guard for safety. Demonstrates good use of UE when sitting.  Ambulation/Gait Ambulation/Gait assistance: Min guard Ambulation Distance (Feet): 150 Feet Assistive device: Rolling walker (2 wheeled) Gait Pattern/deviations: Step-through pattern;Decreased stride length;Trunk flexed Gait velocity: decreased   General Gait Details: Ambulated on RA, Sa02 stayed >90% during gait. Mild dyspnea noted. A few short standing rest breaks with cues for pursed lip breathing. VC for upright posture and RW management. Ambulated to bathroom. Total A for pericare.   Stairs            Wheelchair Mobility    Modified Rankin (Stroke Patients Only)       Balance Overall  balance assessment: Needs assistance   Sitting balance-Leahy Scale: Good       Standing balance-Leahy Scale: Poor Standing balance comment: requires BUE support on RW for stability when standing.                    Cognition Arousal/Alertness: Awake/alert Behavior During Therapy: WFL for tasks assessed/performed Overall Cognitive Status: Within Functional Limits for tasks assessed                      Exercises General Exercises - Lower Extremity Ankle Circles/Pumps: Both;10 reps;Seated Long Arc Quad: Both;10 reps;Seated Hip Flexion/Marching: Both;10 reps;Seated    General Comments        Pertinent Vitals/Pain Pain Assessment: No/denies pain    Home Living                      Prior Function            PT Goals (current goals can now be found in the care plan section) Progress towards PT goals: Progressing toward goals    Frequency  Min 3X/week    PT Plan Current plan remains appropriate    Co-evaluation             End of Session Equipment Utilized During Treatment: Gait belt Activity Tolerance: Patient tolerated treatment well Patient left: in chair;with call bell/phone within reach;with family/visitor present     Time: 3953-2023 PT Time Calculation (min): 28 min  Charges:  $Gait Training: 8-22 mins $  Therapeutic Activity: 8-22 mins                    G CodesAlvie Heidelberg A 2014-09-01, 1:46 PM Alvie Heidelberg, PT, DPT [phone]

## 2014-08-04 ENCOUNTER — Inpatient Hospital Stay (HOSPITAL_COMMUNITY): Payer: Medicare Other

## 2014-08-04 DIAGNOSIS — F411 Generalized anxiety disorder: Secondary | ICD-10-CM

## 2014-08-04 LAB — CBC
HCT: 27.3 % — ABNORMAL LOW (ref 36.0–46.0)
Hemoglobin: 8.6 g/dL — ABNORMAL LOW (ref 12.0–15.0)
MCH: 23.4 pg — ABNORMAL LOW (ref 26.0–34.0)
MCHC: 31.5 g/dL (ref 30.0–36.0)
MCV: 74.4 fL — ABNORMAL LOW (ref 78.0–100.0)
Platelets: 227 10*3/uL (ref 150–400)
RBC: 3.67 MIL/uL — ABNORMAL LOW (ref 3.87–5.11)
RDW: 15.6 % — AB (ref 11.5–15.5)
WBC: 8.2 10*3/uL (ref 4.0–10.5)

## 2014-08-04 LAB — BASIC METABOLIC PANEL
ANION GAP: 8 (ref 5–15)
BUN: 12 mg/dL (ref 6–23)
CALCIUM: 8.6 mg/dL (ref 8.4–10.5)
CO2: 28 mEq/L (ref 19–32)
Chloride: 101 mEq/L (ref 96–112)
Creatinine, Ser: 0.84 mg/dL (ref 0.50–1.10)
GFR calc Af Amer: 74 mL/min — ABNORMAL LOW (ref >90)
GFR, EST NON AFRICAN AMERICAN: 64 mL/min — AB (ref >90)
GLUCOSE: 133 mg/dL — AB (ref 70–99)
Potassium: 4.1 mEq/L (ref 3.7–5.3)
Sodium: 137 mEq/L (ref 137–147)

## 2014-08-04 LAB — GLUCOSE, CAPILLARY
GLUCOSE-CAPILLARY: 130 mg/dL — AB (ref 70–99)
Glucose-Capillary: 121 mg/dL — ABNORMAL HIGH (ref 70–99)
Glucose-Capillary: 151 mg/dL — ABNORMAL HIGH (ref 70–99)
Glucose-Capillary: 135 mg/dL — ABNORMAL HIGH (ref 70–99)

## 2014-08-04 MED ORDER — FUROSEMIDE 20 MG PO TABS
20.0000 mg | ORAL_TABLET | Freq: Two times a day (BID) | ORAL | Status: DC
  Administered 2014-08-04 – 2014-08-05 (×2): 20 mg via ORAL
  Filled 2014-08-04 (×5): qty 1

## 2014-08-04 NOTE — Progress Notes (Signed)
Patient Demographics  [name], is a 80 y.o. female, DOB - 1931/06/07, ZOX:[id number]  Admit date - 08/01/2014   Admitting Physician Lynden Oxford, MD  Outpatient Primary MD for the patient is [name]., MD  LOS - 3   Chief Complaint  Patient presents with  . Respiratory Distress        Subjective:   [name] today has, No headache, No chest pain, No abdominal pain - No Nausea, No new weakness tingling or numbness, much improved Cough - SOB.   Assessment & Plan    1. Acute respiratory failure due to aspiration HCAP with Sepsis. Much better and moved out of step down unit, follow blood and sputum cultures so far all negative, completed 5 days of IV antibiotics, will change to po antibiotics in am. CXR showed improved pneumonia. PT eval done and recommended home health PT.     2. Chronic systolic and diastolic heart failure with last EF 35-40% on echogram - currently compensated, currently on combination of low-dose ARB, hydralazine, beta blocker. Since renal function is back to baseline we'll place her on home dose Lasix.    3. Chronic venous stasis ulcers. Recent surgery by Dr. Helyn Numbers - continue UNA boots. Wound care consult. Not on antibiotics prior to admission. Continue zinc and vitamin supplementation.     4. ARF due to #1 above. resolved with IVF.    5. GERD. Continue PPI.     6. Dyslipidemia. Continue home dose statin.    7.DM2- ISS, hold Glucophage   Lab Results  Component Value Date   HGBA1C 5.6 08/01/2014    CBG (last 3)   Recent Labs  08/03/14 2145 08/04/14 0800 08/04/14 1206  GLUCAP 150* 121* 151*         Code Status: DNR  Family Communication: daughters  Disposition Plan: TBD   Procedures     Consults   Speech   Medications  Scheduled Meds: . acidophilus  1 capsule Oral Daily  . aspirin  81 mg Oral Daily  . calcium-vitamin D  1 tablet Oral Daily  . Chlorhexidine Gluconate Cloth  6 each Topical Q0600  . furosemide  20 mg Oral Daily  . heparin  5,000 Units Subcutaneous 3 times per day  . hydrALAZINE  25 mg Oral BID  . insulin aspart  0-5 Units Subcutaneous QHS  . insulin aspart  0-9 Units Subcutaneous TID WC  . losartan  12.5 mg Oral Daily  . metoprolol  50 mg Oral BID  . mupirocin ointment  1 application Nasal BID  . omega-3 acid ethyl esters  2 g Oral Daily  . pantoprazole  80 mg Oral BID  . piperacillin-tazobactam (ZOSYN)  IV  3.375 g Intravenous 3 times per day  . simvastatin  20 mg Oral QPM  . vancomycin  750 mg Intravenous Q12H  . vitamin C  500 mg Oral Daily  . zinc sulfate  220 mg Oral Daily   Continuous Infusions:  PRN Meds:.albuterol, guaiFENesin-dextromethorphan, HYDROcodone-acetaminophen, nitroGLYCERIN, ondansetron (ZOFRAN) IV, polyethylene glycol, sodium chloride  DVT Prophylaxis   Heparin    Lab Results  Component Value Date   PLT 227 08/04/2014    Antibiotics   Anti-infectives   Start     Dose/Rate  Route Frequency Ordered Stop   08/02/14 1718  vancomycin (VANCOCIN) IVPB 750 mg/150 ml premix     750 mg 150 mL/hr over 60 Minutes Intravenous Every 12 hours 08/02/14 1226     08/02/14 0600  vancomycin (VANCOCIN) 1,250 mg in sodium chloride 0.9 % 250 mL IVPB  Status:  Discontinued     1,250 mg 166.7 mL/hr over 90 Minutes Intravenous Every 24 hours 08/01/14 0900 08/02/14 1226   08/01/14 1000  piperacillin-tazobactam (ZOSYN) IVPB 3.375 g     3.375 g 12.5 mL/hr over 240 Minutes Intravenous 3 times per day 08/01/14 0854     08/01/14 0930  aztreonam (AZACTAM) 2 g in dextrose 5 % 50 mL IVPB  Status:  Discontinued     2 g 100 mL/hr over 30 Minutes Intravenous 3 times per day 08/01/14 0804 08/01/14 0854   08/01/14 0515  vancomycin (VANCOCIN) 1,500 mg in sodium  chloride 0.9 % 500 mL IVPB     1,500 mg 250 mL/hr over 120 Minutes Intravenous  Once 08/01/14 0509 08/01/14 0750   08/01/14 0515  levofloxacin (LEVAQUIN) IVPB 750 mg  Status:  Discontinued     750 mg 100 mL/hr over 90 Minutes Intravenous  Once 08/01/14 0509 08/03/14 1103   08/01/14 0515  piperacillin-tazobactam (ZOSYN) IVPB 3.375 g     3.375 g 100 mL/hr over 30 Minutes Intravenous  Once 08/01/14 0509 08/01/14 0630          Objective:   Filed Vitals:   08/04/14 0304 08/04/14 0537 08/04/14 0631 08/04/14 1437  BP:  166/74 160/65 142/94  Pulse:  72 60 67  Temp:  98.4 F (36.9 C)  98.7 F (37.1 C)  TempSrc:  Oral  Oral  Resp:  20  20  Height:      Weight: 99.428 kg (219 lb 3.2 oz)     SpO2:  98%  98%    Wt Readings from Last 3 Encounters:  08/04/14 99.428 kg (219 lb 3.2 oz)  07/28/14 98.034 kg (216 lb 2 oz)  07/28/14 98.034 kg (216 lb 2 oz)     Intake/Output Summary (Last 24 hours) at 08/04/14 1703 Last data filed at 08/03/14 2121  Gross per 24 hour  Intake      0 ml  Output      2 ml  Net     -2 ml     Physical Exam  Awake Alert, Oriented X 3, No new F.N deficits, Normal affect [place].AT,PERRAL Supple Neck,No JVD, No cervical lymphadenopathy appriciated.  Symmetrical Chest wall movement, Good air movement bilaterally, ++ R.sided rales RRR,No Gallops,Rubs or new Murmurs, No Parasternal Heave +ve B.Sounds, Abd Soft, No tenderness, No organomegaly appriciated, No rebound - guarding or rigidity. No Cyanosis, Clubbing or edema, No new Rash or bruise    Data Review   Micro Results Recent Results (from the past 240 hour(s))  CULTURE, BLOOD (ROUTINE X 2)     Status: None   Collection Time    08/01/14  5:15 AM      Result Value Ref Range Status   Specimen Description BLOOD RIGHT FOREARM   Final   Special Requests     Final   Value: BOTTLES DRAWN AEROBIC AND ANAEROBIC 3CC BLUE 2CC RED   Culture  Setup Time     Final   Value: 08/01/2014 14:35     Performed at  [facility]   Culture     Final   Value:  BLOOD CULTURE RECEIVED NO GROWTH TO DATE CULTURE WILL BE HELD FOR 5 DAYS BEFORE ISSUING A FINAL NEGATIVE REPORT     Performed at [facility]   Report Status PENDING   Incomplete  CULTURE, BLOOD (ROUTINE X 2)     Status: None   Collection Time    08/01/14  5:30 AM      Result Value Ref Range Status   Specimen Description BLOOD RIGHT WRIST   Final   Special Requests BOTTLES DRAWN AEROBIC ONLY 2CCS   Final   Culture  Setup Time     Final   Value: 08/01/2014 14:35     Performed at [facility]   Culture     Final   Value:        BLOOD CULTURE RECEIVED NO GROWTH TO DATE CULTURE WILL BE HELD FOR 5 DAYS BEFORE ISSUING A FINAL NEGATIVE REPORT     Performed at [facility]   Report Status PENDING   Incomplete  URINE CULTURE     Status: None   Collection Time    08/01/14  6:55 AM      Result Value Ref Range Status   Specimen Description URINE, CATHETERIZED   Final   Special Requests NONE   Final   Culture  Setup Time     Final   Value: 08/01/2014 15:03     Performed at [facility] Count     Final   Value: NO GROWTH     Performed at [facility]   Culture     Final   Value: NO GROWTH     Performed at [facility]   Report Status 08/02/2014 FINAL   Final  MRSA PCR SCREENING     Status: Abnormal   Collection Time    08/01/14  8:52 AM      Result Value Ref Range Status   MRSA by PCR POSITIVE (*) NEGATIVE Final   Comment:            The GeneXpert MRSA Assay (FDA     approved for NASAL specimens     only), is one component of a     comprehensive MRSA colonization     surveillance program. It is not     intended to diagnose MRSA     infection nor to guide or     monitor treatment for     MRSA infections.     RESULT CALLED TO, READ BACK BY AND VERIFIED WITH:     B.WILLIFORD,RN 1132 08/01/14 M.CAMPBELL  CULTURE, EXPECTORATED SPUTUM-ASSESSMENT     Status: None    Collection Time    08/01/14  9:32 AM      Result Value Ref Range Status   Specimen Description SPUTUM   Final   Special Requests NONE   Final   Sputum evaluation     Final   Value: THIS SPECIMEN IS ACCEPTABLE. RESPIRATORY CULTURE REPORT TO FOLLOW.   Report Status 08/01/2014 FINAL   Final  CULTURE, RESPIRATORY (NON-EXPECTORATED)     Status: None   Collection Time    08/01/14  9:32 AM      Result Value Ref Range Status   Specimen Description SPUTUM   Final   Special Requests NONE   Final   Gram Stain     Final   Value: FEW WBC PRESENT,BOTH PMN AND MONONUCLEAR     RARE SQUAMOUS EPITHELIAL CELLS PRESENT     RARE Romie Minus  POSITIVE COCCI     IN PAIRS     Performed at [facility]   Culture     Final   Value: NORMAL OROPHARYNGEAL FLORA     Performed at [facility]   Report Status 08/03/2014 FINAL   Final    Radiology Reports Dg Chest Port 1 View  08/04/2014   CLINICAL DATA:  Shortness of breath  EXAM: PORTABLE CHEST - 1 VIEW  COMPARISON:  08/02/2014; 08/01/2014; 07/29/2013  FINDINGS: Grossly unchanged enlarged cardiac silhouette and mediastinal contours. Minimally improved aeration of the right lung with persistent ill-defined heterogeneous airspace opacities. Worsening pulmonary venous congestion without frank evidence of edema. No new focal airspace opacities. No pleural effusion or pneumothorax. Unchanged bones.  IMPRESSION: Minimally improved aeration of the right lung suggests resolving infection and (less likely) asymmetric pulmonary edema. A follow-up chest radiograph in 4 to 6 weeks after treatment is recommended to ensure resolution.   Electronically Signed   By: Simonne Come M.D.   On: 08/04/2014 07:50     CBC  Recent Labs Lab 08/01/14 0530 08/01/14 0851 08/02/14 0600 08/03/14 0844 08/04/14 0615  WBC 13.1* 16.7* 17.2* 14.2* 8.2  HGB 11.4* 11.0* 10.0* 8.7* 8.6*  HCT 37.1 35.1* 32.7* 27.7* 27.3*  PLT 275 284 253 264 227  MCV 76.7* 76.8* 76.8* 76.3* 74.4*  MCH  23.6* 24.1* 23.5* 24.0* 23.4*  MCHC 30.7 31.3 30.6 31.4 31.5  RDW 16.0* 16.0* 16.1* 16.1* 15.6*  LYMPHSABS 0.9  --   --   --   --   MONOABS 0.6  --   --   --   --   EOSABS 0.0  --   --   --   --   BASOSABS 0.0  --   --   --   --     Chemistries   Recent Labs Lab 08/01/14 0530 08/01/14 0851 08/02/14 0600 08/03/14 0844 08/04/14 0615  NA 136*  --  140 135* 137  K 4.0  --  4.4 4.6 4.1  CL 99  --  103 101 101  CO2 21  --  GLUCOSE 125*  --  121* 148* 133*  BUN 23  --  CREATININE 1.21* 1.28* 1.15* 0.84 0.84  CALCIUM 8.9  --  8.5 8.4 8.6  AST 20  --   --   --   --   ALT 9  --   --   --   --   ALKPHOS 66  --   --   --   --   BILITOT 0.5  --   --   --   --    ------------------------------------------------------------------------------------------------------------------ estimated creatinine clearance is 58.9 ml/min (by C-G formula based on Cr of 0.84). ------------------------------------------------------------------------------------------------------------------ No results found for this basename: HGBA1C,  in the last 72 hours ------------------------------------------------------------------------------------------------------------------ No results found for this basename: CHOL, HDL, LDLCALC, TRIG, CHOLHDL, LDLDIRECT,  in the last 72 hours ------------------------------------------------------------------------------------------------------------------ No results found for this basename: TSH, T4TOTAL, FREET3, T3FREE, THYROIDAB,  in the last 72 hours ------------------------------------------------------------------------------------------------------------------ No results found for this basename: VITAMINB12, FOLATE, FERRITIN, TIBC, IRON, RETICCTPCT,  in the last 72 hours  Coagulation profile  Recent Labs Lab 08/01/14 0530  INR 1.13    No results found for this basename: DDIMER,  in the last 72 hours  Cardiac Enzymes  Recent Labs Lab  08/01/14 0530  TROPONINI <0.30   ------------------------------------------------------------------------------------------------------------------ No components found with this basename: POCBNP,  Time Spent in minutes  35   Eula Mazzola M.D on 08/04/2014 at 5:03 PM  Between 7am to 7pm - Pager - [phone]  After 7pm go to [email or web address] - password [email or web address]  And look for the night coverage person covering for me after hours  [facility] Office  [phone]   **Disclaimer: This note may have been dictated with voice recognition software. Similar sounding words can inadvertently be transcribed and this note may contain transcription errors which may not have been corrected upon publication of note.**

## 2014-08-04 NOTE — Progress Notes (Signed)
IV infiltrated. Earlier dose of vanc- not given. Pharmacy notified- vanc moved to now. Vanc trough changed.

## 2014-08-04 NOTE — Care Management Note (Signed)
    Page 1 of 2   08/05/2014     12:03:08 PM CARE MANAGEMENT NOTE 08/05/2014  Patient:  [name], [name]   Account Number:  [id number]  Date Initiated:  08/04/2014  Documentation initiated by:  Letha Cape  Subjective/Objective Assessment:   dx hap  admit- lives alone, uses rollatorand cane at home. Patient is active with telephonic CHF program with [facility].     Action/Plan:   pt eval- rec hhpt.   Anticipated DC Date:  08/05/2014   Anticipated DC Plan:  HOME W HOME HEALTH SERVICES      DC Planning Services  CM consult      [facility] Choice  HOME HEALTH   Choice offered to / List presented to:  C-4 Adult Children        HH arranged  HH-1 RN  HH-2 PT      [facility] agency  [facility]   Status of service:  Completed, signed off Medicare Important Message given?  YES (If response is "NO", the following Medicare IM given date fields will be blank) Date Medicare IM given:  08/04/2014 Medicare IM given by:  Letha Cape Date Additional Medicare IM given:   Additional Medicare IM given by:    Discharge Disposition:  HOME W HOME HEALTH SERVICES  Per UR Regulation:  Reviewed for med. necessity/level of care/duration of stay  If discussed at Long Length of Stay Meetings, dates discussed:    Comments:  08/05/14 1159 Letha Cape RN ,BSN [phone] patient is for dc today, NCM spoke with patient's daughgter, [name], and she chose [facility] for [facility] for CHF  and HHPt, patient states she does not need an aide.  Patient also states that she previously went to [place] [facility].  Referral givent to [facility] , Lupita Leash notified. SOC will begin 24-48 hrs post dc.  08/04/14 1042 Letha Cape RN, BSN [phone] patient lives alone, NCM spoke with her and her daughter , [name].  Patient is active with telephonic CHF program with [facility] and a RN comes by to see her once a year.  Per physcial therapy recs hhpt, patient uses a cane and a rollator at home.

## 2014-08-05 DIAGNOSIS — I428 Other cardiomyopathies: Secondary | ICD-10-CM

## 2014-08-05 LAB — PROCALCITONIN: Procalcitonin: 10.81 ng/mL

## 2014-08-05 LAB — GLUCOSE, CAPILLARY
GLUCOSE-CAPILLARY: 124 mg/dL — AB (ref 70–99)
GLUCOSE-CAPILLARY: 128 mg/dL — AB (ref 70–99)

## 2014-08-05 LAB — VANCOMYCIN, TROUGH: VANCOMYCIN TR: 11.7 ug/mL (ref 10.0–20.0)

## 2014-08-05 MED ORDER — MUPIROCIN 2 % EX OINT
1.0000 | TOPICAL_OINTMENT | Freq: Two times a day (BID) | CUTANEOUS | Status: AC
Start: 2014-08-05 — End: 2014-08-07

## 2014-08-05 MED ORDER — LEVOFLOXACIN 500 MG PO TABS
500.0000 mg | ORAL_TABLET | Freq: Every day | ORAL | Status: DC
  Administered 2014-08-05: 500 mg via ORAL
  Filled 2014-08-05: qty 1

## 2014-08-05 MED ORDER — LEVOFLOXACIN 500 MG PO TABS: 500.0000 mg | ORAL_TABLET | Freq: Every day | ORAL | Status: DC

## 2014-08-05 MED ORDER — GUAIFENESIN-DM 100-10 MG/5ML PO SYRP: 5.0000 mL | ORAL_SOLUTION | ORAL | Status: AC | PRN

## 2014-08-05 MED ORDER — NITROGLYCERIN 0.4 MG SL SUBL: 0.4000 mg | SUBLINGUAL_TABLET | SUBLINGUAL | Status: DC | PRN

## 2014-08-05 MED ORDER — METOPROLOL TARTRATE 50 MG PO TABS: 50.0000 mg | ORAL_TABLET | Freq: Two times a day (BID) | ORAL | Status: DC

## 2014-08-05 NOTE — Progress Notes (Signed)
Physical Therapy Treatment Patient Details Name: [name] MRN: [id number] DOB: Oct 02, 1931 Today's Date: 08/05/2014    History of Present Illness pt presents with HCAP and hx of HF.      PT Comments    Patient progressing well with mobility and increased ambulation distance today. Ambulated on RA and Sa02 decreased to 82% towards end of ambulation distance. No dyspnea noted. Education provided on pursed lip breathing. No assist required for transfers today. Pt remained on RA upon PT departure with Sa02 in mid 90s. Tech to check sats when ambulating to bathroom later in day to see if 02 drops with mobility. May need supplemental 02 at discharge. Will continue to follow.   Follow Up Recommendations  Home health PT;Supervision - Intermittent     Equipment Recommendations  None recommended by PT;Other (comment) (May need home 02. TEch to check later in day.)    Recommendations for Other Services       Precautions / Restrictions Precautions Precautions: Fall Restrictions Weight Bearing Restrictions: No    Mobility  Bed Mobility               General bed mobility comments: Received sitting in chair upon PT arrival.   Transfers Overall transfer level: Needs assistance Equipment used: Rolling walker (2 wheeled) Transfers: Sit to/from Stand Sit to Stand: Supervision         General transfer comment: Able to stand safely from recliner without physical assist. VC to reach back for surface prior to sitting.  Ambulation/Gait Ambulation/Gait assistance: Supervision Ambulation Distance (Feet): 200 Feet Assistive device: Rolling walker (2 wheeled) Gait Pattern/deviations: Step-through pattern;Decreased stride length;Trunk flexed Gait velocity: decreased   General Gait Details: Ambulated on RA, Sa02 decreased to 82% towards end of distance, asymptomatic. VC provided for pursed lip breathing. No dyspnea noted. VC for upright posture. Sa02 at rest on RA mid 90s. A few  short standing rest breaks.   Stairs            Wheelchair Mobility    Modified Rankin (Stroke Patients Only)       Balance     Sitting balance-Leahy Scale: Good       Standing balance-Leahy Scale: Poor Standing balance comment: Requires BUE support on RW for stability during static and dynamic standing.                    Cognition Arousal/Alertness: Awake/alert Behavior During Therapy: WFL for tasks assessed/performed Overall Cognitive Status: Within Functional Limits for tasks assessed                      Exercises General Exercises - Lower Extremity Ankle Circles/Pumps: Both;10 reps;Seated Long Arc Quad: Both;10 reps;Seated Hip Flexion/Marching: Both;15 reps;Seated Other Exercises Other Exercises: Performed spirometer x5 increasing goal.    General Comments General comments (skin integrity, edema, etc.): Pt coughing up phlegm at beginning and increased towards end of treatment.       Pertinent Vitals/Pain Pain Assessment: No/denies pain    Home Living                      Prior Function            PT Goals (current goals can now be found in the care plan section) Progress towards PT goals: Progressing toward goals    Frequency  Min 3X/week    PT Plan Current plan remains appropriate    Co-evaluation  End of Session Equipment Utilized During Treatment: Gait belt;Oxygen Activity Tolerance: Patient tolerated treatment well (Except drop in Sa02 with increased ambulation distance to 82%.) Patient left: in chair;with call bell/phone within reach     Time: 1003-1030 PT Time Calculation (min): 27 min  Charges:  $Gait Training: 8-22 mins $Therapeutic Activity: 8-22 mins                    G CodesAlvie Heidelberg A 2014/08/14, 10:59 AM Alvie Heidelberg, PT, DPT [phone]

## 2014-08-05 NOTE — Discharge Summary (Signed)
Physician Discharge Summary  [name] WUJ:[id number] DOB: October 08, 1931 DOA: 08/01/2014  PCP: [name]., MD  Admit date: 08/01/2014 Discharge date: 08/05/2014  Time spent: 30 minutes  Recommendations for Outpatient Follow-up:  1. Follow up with PCP in one week 2. Follow u with cardiology in 2 to 4 weeks.  3. We have decreased the dose of metoprolol from  BID to 50 mg BID, please take the new dose of the medications.  4. Please complete the course of the antibiotic 4 to 5 days.  5. Please get CXR in 4 week to evaluate for resolution of the pneumonia.  6. Follow up with home health PT/RN as recommended.  7. Follow up with Dr Kelly Splinter wound care.   Discharge Diagnoses:  Principal Problem:   HCAP (healthcare-associated pneumonia) Active Problems:   HTN (hypertension)   GERD (gastroesophageal reflux disease)   Respiratory failure, acute 06/03/12   Venous ulcer LE, chronic, followed at wound center, ABIs Jan 2013 WNL   Cardiomyopathy, EF 35-40%   Seizure, convulsive   CAD (coronary artery disease), mild, 20% cath 06/06/12   Hospital acquired PNA   Discharge Condition: improved  Diet recommendation: low sodium, carb modified diet.   Filed Weights   08/03/14 0209 08/04/14 0304 08/05/14 0500  Weight: 101.787 kg (224 lb 6.4 oz) 99.428 kg (219 lb 3.2 oz) 97.387 kg (214 lb 11.2 oz)    History of present illness:  [name] is a 80 y.o. female, history of chronic venous stasis ulcer and lower extremities for which she underwent plastic surgery correction few days ago prior to admission by Dr. Helyn Numbers, chronic systolic and diastolic heart failure EF 35-40%, dyslipidemia, hypertension, diabetes mellitus type 2, who was doing fine at home however the  Morning of admission,  she woke up with episode of nausea vomiting and diarrhea, within the next few minutes she started feeling short of breath came to the ER where she was found to be septic, hypotensive, in acute respiratory  failure with right-sided pneumonia.   Hospital Course:  Acute respiratory failure secondary to HCAP and sepsis:  She was initially admitted to step down , meanwhile her blood cultures and sputum cultures have been negative. She has completed 5 to 6 days of IV antibiotics and her repeat CXR shows mild improvement in the pneumonia, hence we will change to po abx to complete the course. She also has worked with PT and has maintained oxygen saturations greater than 92% on ambulation on RA. Recommend outpatient CXR in 2 to 4 weeks to evaluate for resolution of the pneumonia.    Chronic systolic and diastolic heart failure: appears to be compensated. Resumed her home dose of lasix with potassium supplements. Resume b blocker at a lower dose as her HR has been running around 60's. Follow up with cardiology in 2 to 4 weeks for optimization of meds.  Chronic venous stasis ulcers: Further management as per Dr Kelly Splinter.   Acute renal failure;  resolved with fluids.   GERD:  Continue with PPI.   Diabetes mellitus:  CBG (last 3)   Recent Labs  08/04/14 2123 08/05/14 0755 08/05/14 1149  GLUCAP 135* 124* 128*    Resume home metformin  Procedures:  CXR  Consultations:  NONE  Discharge Exam: Filed Vitals:   08/05/14 0700  BP: 159/64  Pulse: 67  Temp:   Resp:     General: alert afebrile comfortable Cardiovascular: s1s2 Respiratory: CTAB  Discharge Instructions You were cared for by a hospitalist during your  hospital stay. If you have any questions about your discharge medications or the care you received while you were in the hospital after you are discharged, you can call the unit and asked to speak with the hospitalist on call if the hospitalist that took care of you is not available. Once you are discharged, your primary care physician will handle any further medical issues. Please note that NO REFILLS for any discharge medications will be authorized once you are discharged, as  it is imperative that you return to your primary care physician (or establish a relationship with a primary care physician if you do not have one) for your aftercare needs so that they can reassess your need for medications and monitor your lab values.  Discharge Instructions   (HEART FAILURE PATIENTS) Call MD:  Anytime you have any of the following symptoms: 1) 3 pound weight gain in 24 hours or 5 pounds in 1 week 2) shortness of breath, with or without a dry hacking cough 3) swelling in the hands, feet or stomach 4) if you have to sleep on extra pillows at night in order to breathe.    Complete by:  As directed      Call MD for:  difficulty breathing, headache or visual disturbances    Complete by:  As directed      Call MD for:  persistant dizziness or light-headedness    Complete by:  As directed      Call MD for:  temperature >100.4    Complete by:  As directed      Diet - low sodium heart healthy    Complete by:  As directed      Discharge instructions    Complete by:  As directed   Follow up with PCP in 1 to 2 weeks.  Follow up with cardiology in 2 weeks . Please obtain chest X RAY IN 2 to 4 weeks to evaluation for resolution of the pneumonia.     Increase activity slowly    Complete by:  As directed           Current Discharge Medication List    START taking these medications   Details  guaiFENesin-dextromethorphan (ROBITUSSIN DM) 100-10 MG/5ML syrup Take 5 mLs by mouth every 4 (four) hours as needed for cough. Qty: 118 mL, Refills: 0    levofloxacin (LEVAQUIN) 500 MG tablet Take 1 tablet (500 mg total) by mouth daily. Qty: 5 tablet, Refills: 0    mupirocin ointment (BACTROBAN) 2 % Place 1 application into the nose 2 (two) times daily. Qty: 22 g, Refills: 0    nitroGLYCERIN (NITROSTAT) 0.4 MG SL tablet Place 1 tablet (0.4 mg total) under the tongue every 5 (five) minutes as needed for chest pain (CP or SOB). Qty: 30 tablet, Refills: 1      CONTINUE these medications  which have CHANGED   Details  metoprolol (LOPRESSOR) 50 MG tablet Take 1 tablet (50 mg total) by mouth 2 (two) times daily. Qty: 60 tablet, Refills: 1      CONTINUE these medications which have NOT CHANGED   Details  acidophilus (RISAQUAD) CAPS Take 1 capsule by mouth daily.    aspirin 81 MG chewable tablet Chew 81 mg by mouth daily.     calcium-vitamin D (OSCAL WITH D) 500-200 MG-UNIT per tablet Take 1 tablet by mouth daily.    Coenzyme Q10 (CO Q-10) 200 MG CAPS Take 200 mg by mouth daily.    furosemide (LASIX) 40 MG tablet  Take 40 mg by mouth daily.    hydrALAZINE (APRESOLINE) 25 MG tablet Take 25 mg by mouth 2 (two) times daily.    losartan (COZAAR) 100 MG tablet Take 1 tablet (100 mg total) by mouth daily. Qty: 90 tablet, Refills: 3    metFORMIN (GLUCOPHAGE) 500 MG tablet Take 500 mg by mouth 2 (two) times daily with a meal.    omega-3 acid ethyl esters (LOVAZA) 1 G capsule Take 2 g by mouth daily.    omeprazole (PRILOSEC) 40 MG capsule Take 40 mg by mouth 2 (two) times daily.     potassium chloride SA (K-DUR,KLOR-CON) 20 MEQ tablet Take 20 mEq by mouth daily.    simvastatin (ZOCOR) 20 MG tablet Take 20 mg by mouth every evening.    vitamin C (ASCORBIC ACID) 500 MG tablet Take 500 mg by mouth daily.    Zinc 50 MG TABS Take 1 tablet by mouth daily.       Allergies  Allergen Reactions  . Lisinopril Other (See Comments)    cough      The results of significant diagnostics from this hospitalization (including imaging, microbiology, ancillary and laboratory) are listed below for reference.    Significant Diagnostic Studies: Dg Chest 2 View  08/01/2014   CLINICAL DATA:  Respiratory distress.  EXAM: CHEST  2 VIEW  COMPARISON:  Chest radiograph July 29, 2013  FINDINGS: Patchy dense consolidation throughout right lung. Left lung is clear. No pleural effusions.  Cardiac silhouette appears mildly enlarged, mediastinal silhouette is nonsuspicious, mildly calcified aortic  knob no pneumothorax.  Soft tissue planes and included osseous structures are nonsuspicious. Degenerative change of the thoracic spine and acromioclavicular joints.  IMPRESSION: Dense consolidations throughout the right lung concerning for pneumonia. Recommend followup chest radiograph after treatment to verify improvement.  Stable mild cardiomegaly.   Electronically Signed   By: Awilda Metro   On: 08/01/2014 05:04   US Arterial Seg Multiple  07/22/2014   CLINICAL DATA:  Leg ulcers  EXAM: NONINVASIVE PHYSIOLOGIC VASCULAR STUDY OF BILATERAL LOWER EXTREMITIES  TECHNIQUE: Evaluation of both lower extremities was performed at rest, including calculation of ankle-brachial indices, multiple segmental pressure evaluation, segmental Doppler and segmental pulse volume recording.  COMPARISON:  None.  FINDINGS: Right ABI:  1.01  Left ABI:  1.01  Right Lower Extremity:  Triphasic waveforms throughout.  Left Lower Extremity:  Triphasic waveforms throughout.  IMPRESSION: No significant arterial occlusive disease.   Electronically Signed   By: Maryclare Bean M.D.   On: 07/22/2014 17:10   Dg Chest Port 1 View  08/04/2014   CLINICAL DATA:  Shortness of breath  EXAM: PORTABLE CHEST - 1 VIEW  COMPARISON:  08/02/2014; 08/01/2014; 07/29/2013  FINDINGS: Grossly unchanged enlarged cardiac silhouette and mediastinal contours. Minimally improved aeration of the right lung with persistent ill-defined heterogeneous airspace opacities. Worsening pulmonary venous congestion without frank evidence of edema. No new focal airspace opacities. No pleural effusion or pneumothorax. Unchanged bones.  IMPRESSION: Minimally improved aeration of the right lung suggests resolving infection and (less likely) asymmetric pulmonary edema. A follow-up chest radiograph in 4 to 6 weeks after treatment is recommended to ensure resolution.   Electronically Signed   By: Simonne Come M.D.   On: 08/04/2014 07:50   Dg Chest Port 1 View  08/02/2014   CLINICAL  DATA:  Shortness of breath.  EXAM: PORTABLE CHEST - 1 VIEW  COMPARISON:  08/01/2014  FINDINGS: There continues to be patchy airspace densities in the right lung. Left  lung is clear. Heart size is upper limits of normal but unchanged. Negative for a pneumothorax.  IMPRESSION: Persistent airspace disease in the right lung. Findings are suggestive for pneumonia. Minimal change from the previous examination. Recommend follow-up to ensure resolution.   Electronically Signed   By: Richarda Overlie M.D.   On: 08/02/2014 07:38   Dg Abd Portable 1v  08/01/2014   CLINICAL DATA:  Nausea, vomiting and diarrhea  EXAM: PORTABLE ABDOMEN - 1 VIEW  COMPARISON:  Chest x-ray obtained earlier today  FINDINGS: Unremarkable bowel gas pattern. Gas is noted distally to the level of the rectum. Air is also present with an loops of nondilated small bowel in the mid abdomen. This is a nonspecific finding. Multilevel degenerative change throughout the visualized spine. Mild bilateral hip joint osteoarthritis. No acute osseous abnormality.  IMPRESSION: Nonobstructed bowel gas pattern.   Electronically Signed   By: Malachy Moan M.D.   On: 08/01/2014 09:13    Microbiology: Recent Results (from the past 240 hour(s))  CULTURE, BLOOD (ROUTINE X 2)     Status: None   Collection Time    08/01/14  5:15 AM      Result Value Ref Range Status   Specimen Description BLOOD RIGHT FOREARM   Final   Special Requests     Final   Value: BOTTLES DRAWN AEROBIC AND ANAEROBIC 3CC BLUE 2CC RED   Culture  Setup Time     Final   Value: 08/01/2014 14:35     Performed at [facility]   Culture     Final   Value:        BLOOD CULTURE RECEIVED NO GROWTH TO DATE CULTURE WILL BE HELD FOR 5 DAYS BEFORE ISSUING A FINAL NEGATIVE REPORT     Performed at [facility]   Report Status PENDING   Incomplete  CULTURE, BLOOD (ROUTINE X 2)     Status: None   Collection Time    08/01/14  5:30 AM      Result Value Ref Range Status   Specimen  Description BLOOD RIGHT WRIST   Final   Special Requests BOTTLES DRAWN AEROBIC ONLY 2CCS   Final   Culture  Setup Time     Final   Value: 08/01/2014 14:35     Performed at [facility]   Culture     Final   Value:        BLOOD CULTURE RECEIVED NO GROWTH TO DATE CULTURE WILL BE HELD FOR 5 DAYS BEFORE ISSUING A FINAL NEGATIVE REPORT     Performed at [facility]   Report Status PENDING   Incomplete  URINE CULTURE     Status: None   Collection Time    08/01/14  6:55 AM      Result Value Ref Range Status   Specimen Description URINE, CATHETERIZED   Final   Special Requests NONE   Final   Culture  Setup Time     Final   Value: 08/01/2014 15:03     Performed at [facility]   Colony Count     Final   Value: NO GROWTH     Performed at [facility]   Culture     Final   Value: NO GROWTH     Performed at [facility]   Report Status 08/02/2014 FINAL   Final  MRSA PCR SCREENING     Status: Abnormal   Collection Time    08/01/14  8:52  AM      Result Value Ref Range Status   MRSA by PCR POSITIVE (*) NEGATIVE Final   Comment:            The GeneXpert MRSA Assay (FDA     approved for NASAL specimens     only), is one component of a     comprehensive MRSA colonization     surveillance program. It is not     intended to diagnose MRSA     infection nor to guide or     monitor treatment for     MRSA infections.     RESULT CALLED TO, READ BACK BY AND VERIFIED WITH:     B.WILLIFORD,RN 1132 08/01/14 M.CAMPBELL  CULTURE, EXPECTORATED SPUTUM-ASSESSMENT     Status: None   Collection Time    08/01/14  9:32 AM      Result Value Ref Range Status   Specimen Description SPUTUM   Final   Special Requests NONE   Final   Sputum evaluation     Final   Value: THIS SPECIMEN IS ACCEPTABLE. RESPIRATORY CULTURE REPORT TO FOLLOW.   Report Status 08/01/2014 FINAL   Final  CULTURE, RESPIRATORY (NON-EXPECTORATED)     Status: None   Collection Time    08/01/14  9:32  AM      Result Value Ref Range Status   Specimen Description SPUTUM   Final   Special Requests NONE   Final   Gram Stain     Final   Value: FEW WBC PRESENT,BOTH PMN AND MONONUCLEAR     RARE SQUAMOUS EPITHELIAL CELLS PRESENT     RARE GRAM POSITIVE COCCI     IN PAIRS     Performed at [facility]   Culture     Final   Value: NORMAL OROPHARYNGEAL FLORA     Performed at [facility]   Report Status 08/03/2014 FINAL   Final     Labs: Basic Metabolic Panel:  Recent Labs Lab 08/01/14 0530 08/01/14 0851 08/02/14 0600 08/03/14 0844 08/04/14 0615  NA 136*  --  140 135* 137  K 4.0  --  4.4 4.6 4.1  CL 99  --  103 101 101  CO2 21  --  GLUCOSE 125*  --  121* 148* 133*  BUN 23  --  CREATININE 1.21* 1.28* 1.15* 0.84 0.84  CALCIUM 8.9  --  8.5 8.4 8.6   Liver Function Tests:  Recent Labs Lab 08/01/14 0530  AST 20  ALT 9  ALKPHOS 66  BILITOT 0.5  PROT 7.0  ALBUMIN 3.1*   No results found for this basename: LIPASE, AMYLASE,  in the last 168 hours No results found for this basename: AMMONIA,  in the last 168 hours CBC:  Recent Labs Lab 08/01/14 0530 08/01/14 0851 08/02/14 0600 08/03/14 0844 08/04/14 0615  WBC 13.1* 16.7* 17.2* 14.2* 8.2  NEUTROABS 11.6*  --   --   --   --   HGB 11.4* 11.0* 10.0* 8.7* 8.6*  HCT 37.1 35.1* 32.7* 27.7* 27.3*  MCV 76.7* 76.8* 76.8* 76.3* 74.4*  PLT 275 284 253 264 227   Cardiac Enzymes:  Recent Labs Lab 08/01/14 0530  TROPONINI <0.30   BNP: BNP (last 3 results)  Recent Labs  08/01/14 0530  PROBNP 3920.0*   CBG:  Recent Labs Lab 08/04/14 0800 08/04/14 1206 08/04/14 1715 08/04/14 2123 08/05/14 0755  GLUCAP 121* 151* 130* 135* 124*  SignedKathlen Mody  [facility] 08/05/2014, 10:45 AM

## 2014-08-05 NOTE — Progress Notes (Signed)
NURSING PROGRESS NOTE  [name] [id number] Discharge Data: 08/05/2014 1:42 PM Attending Provider: No att. providers found YYT:[id number],CLEXNTZG R., MD     [name] to be D/C'd Home per MD order.  Discussed with the patient the After Visit Summary and all questions fully answered. All IV's discontinued with no bleeding noted. All belongings returned to patient for patient to take home.   Last Vital Signs:  Blood pressure 154/70, pulse 76, temperature 98.7 F (37.1 C), temperature source Oral, resp. rate 20, height 5\' 2"  (1.575 m), weight 97.387 kg (214 lb 11.2 oz), SpO2 97.00%.  Discharge Medication List   Medication List         acidophilus Caps capsule  Take 1 capsule by mouth daily.     aspirin 81 MG chewable tablet  Chew 81 mg by mouth daily.     calcium-vitamin D 500-200 MG-UNIT per tablet  Commonly known as:  OSCAL WITH D  Take 1 tablet by mouth daily.     Co Q-10 200 MG Caps  Take 200 mg by mouth daily.     furosemide 40 MG tablet  Commonly known as:  LASIX  Take 40 mg by mouth daily.     guaiFENesin-dextromethorphan 100-10 MG/5ML syrup  Commonly known as:  ROBITUSSIN DM  Take 5 mLs by mouth every 4 (four) hours as needed for cough.     hydrALAZINE 25 MG tablet  Commonly known as:  APRESOLINE  Take 25 mg by mouth 2 (two) times daily.     levofloxacin 500 MG tablet  Commonly known as:  LEVAQUIN  Take 1 tablet (500 mg total) by mouth daily.     losartan 100 MG tablet  Commonly known as:  COZAAR  Take 1 tablet (100 mg total) by mouth daily.     metFORMIN 500 MG tablet  Commonly known as:  GLUCOPHAGE  Take 500 mg by mouth 2 (two) times daily with a meal.     metoprolol 50 MG tablet  Commonly known as:  LOPRESSOR  Take 1 tablet (50 mg total) by mouth 2 (two) times daily.     mupirocin ointment 2 %  Commonly known as:  BACTROBAN  Place 1 application into the nose 2 (two) times daily.     nitroGLYCERIN 0.4 MG SL tablet  Commonly known as:   NITROSTAT  Place 1 tablet (0.4 mg total) under the tongue every 5 (five) minutes as needed for chest pain (CP or SOB).     omega-3 acid ethyl esters 1 G capsule  Commonly known as:  LOVAZA  Take 2 g by mouth daily.     omeprazole 40 MG capsule  Commonly known as:  PRILOSEC  Take 40 mg by mouth 2 (two) times daily.     potassium chloride SA 20 MEQ tablet  Commonly known as:  K-DUR,KLOR-CON  Take 20 mEq by mouth daily.     simvastatin 20 MG tablet  Commonly known as:  ZOCOR  Take 20 mg by mouth every evening.     vitamin C 500 MG tablet  Commonly known as:  ASCORBIC ACID  Take 500 mg by mouth daily.     Zinc 50 MG Tabs  Take 1 tablet by mouth daily.         Cathlyn Parsons, RN

## 2014-08-07 LAB — CULTURE, BLOOD (ROUTINE X 2)
CULTURE: NO GROWTH
Culture: NO GROWTH

## 2014-08-16 ENCOUNTER — Encounter (HOSPITAL_BASED_OUTPATIENT_CLINIC_OR_DEPARTMENT_OTHER): Payer: Medicare Other | Attending: Plastic Surgery

## 2014-08-16 DIAGNOSIS — L97909 Non-pressure chronic ulcer of unspecified part of unspecified lower leg with unspecified severity: Secondary | ICD-10-CM | POA: Diagnosis not present

## 2014-08-16 DIAGNOSIS — I872 Venous insufficiency (chronic) (peripheral): Secondary | ICD-10-CM | POA: Diagnosis not present

## 2014-08-17 NOTE — Progress Notes (Signed)
[facility]  NAME:  [name], [name]                   ACCOUNT NO.:  MEDICAL RECORD NO.:  [id number]      DATE OF BIRTH:  Sep 16, 1931  PHYSICIAN:  Wayland Denis, DO       VISIT DATE:  08/16/2014                                  OFFICE VISIT   The patient is an 80 year old female who is here for followup on her bilateral lower extremity ulcers.  She underwent debridement with ACell placement and then ended up with pneumonia with a readmission into the ICU.  She is doing much better and is back home at this time.  She does have Home Health as well.  The ACell is still on the right leg and it is incorporated on the left.  There is no sign of infection.  She has filled in very nicely, so there is no malodor.  Today, we will place an Endoform on the left leg and KY jelly on the right and have her continue the KY jelly on the right daily and then on the left, placed the Endoform every other day.     Wayland Denis, DO     CS/MEDQ  D:  08/16/2014  T:  08/16/2014  Job:  [id number]

## 2014-08-23 ENCOUNTER — Other Ambulatory Visit: Payer: Self-pay | Admitting: Internal Medicine

## 2014-08-23 ENCOUNTER — Ambulatory Visit
Admission: RE | Admit: 2014-08-23 | Discharge: 2014-08-23 | Disposition: A | Discharge status: Home / Self Care | Payer: 59 | Source: Ambulatory Visit | Attending: Internal Medicine | Admitting: Internal Medicine

## 2014-08-23 DIAGNOSIS — I509 Heart failure, unspecified: Secondary | ICD-10-CM

## 2014-08-23 DIAGNOSIS — L97909 Non-pressure chronic ulcer of unspecified part of unspecified lower leg with unspecified severity: Secondary | ICD-10-CM | POA: Diagnosis not present

## 2014-08-23 DIAGNOSIS — I872 Venous insufficiency (chronic) (peripheral): Secondary | ICD-10-CM | POA: Diagnosis not present

## 2014-08-24 NOTE — Progress Notes (Signed)
[facility]  NAME:  [name], [name]                   ACCOUNT NO.:  MEDICAL RECORD NO.:  [id number]      DATE OF BIRTH:  1931/02/16  PHYSICIAN:  Wayland Denis, DO       VISIT DATE:  08/23/2014                                  OFFICE VISIT   The patient is an 80 year old female who is here for followup on her bilateral lower extremity chronic venous insufficiency ulcers.  She is doing extremely well.  She has purchased the compression stockings but has not started wearing them yet.  We used Endoform last week.  She is showing great signs of overall improvement both in depth and in size of all the wounds.  There is no change in her medications or social history.  On exam, she is alert, oriented, cooperative, not in any distress.  She is very pleasant.  Pupils are equal.  Her breathing is unlabored.  Her heart rate is regular.  The wound is described above and noted in the nurses' notes.  We will put another Endoform on today and have Home Health change it this week, and we will see her back in 1 week.     Wayland Denis, DO     CS/MEDQ  D:  08/23/2014  T:  08/23/2014  Job:  [id number]

## 2014-08-27 ENCOUNTER — Other Ambulatory Visit (HOSPITAL_COMMUNITY): Payer: Self-pay | Admitting: Internal Medicine

## 2014-08-27 DIAGNOSIS — Z1231 Encounter for screening mammogram for malignant neoplasm of breast: Secondary | ICD-10-CM

## 2014-08-30 DIAGNOSIS — I872 Venous insufficiency (chronic) (peripheral): Secondary | ICD-10-CM | POA: Diagnosis not present

## 2014-08-30 DIAGNOSIS — L97909 Non-pressure chronic ulcer of unspecified part of unspecified lower leg with unspecified severity: Secondary | ICD-10-CM | POA: Diagnosis not present

## 2014-08-31 NOTE — Progress Notes (Signed)
[facility]  NAMEGLENNETTE, [name]              ACCOUNT NO.:  [id number]  MEDICAL RECORD NO.:  [id number]      DATE OF BIRTH:  1931/02/17  PHYSICIAN:  Wayland Denis, DO       VISIT DATE:  08/30/2014                                  OFFICE VISIT   The patient is an 80 year old female who is here for followup on her bilateral lower extremity chronic ulcers.  She had Endoform placed last week and is showing excellent signs of improvement. She has just a tiny little bit of hypergranulation.  Review of systems is negative.  No change in her medications.  On exam, she is very pleasant.  She is accompanied by her daughter.  She is alert and oriented.  Her breathing is unlabored.  Her heart rate is regular.  Her pulses are regular and strong.  Her abdomen is soft.  I used a touch of silver nitrate for the hypergranulation and the Endoform was placed. There is no sign of infection, and overall she shows remarkable signs of improvement and starting to epithelialize the area. She should continue with her protein elevation, multivitamin, zinc, and we will see her back in a week.     Wayland Denis, DO     CS/MEDQ  D:  08/30/2014  T:  08/31/2014  Job:  [id number]

## 2014-09-01 ENCOUNTER — Encounter: Payer: Self-pay | Admitting: Physician Assistant

## 2014-09-01 ENCOUNTER — Ambulatory Visit (INDEPENDENT_AMBULATORY_CARE_PROVIDER_SITE_OTHER): Payer: 59 | Admitting: Physician Assistant

## 2014-09-01 VITALS — BP 150/70 | HR 60 | Ht 62.0 in | Wt 207.3 lb

## 2014-09-01 DIAGNOSIS — IMO0001 Reserved for inherently not codable concepts without codable children: Secondary | ICD-10-CM

## 2014-09-01 DIAGNOSIS — I519 Heart disease, unspecified: Secondary | ICD-10-CM

## 2014-09-01 DIAGNOSIS — I5042 Chronic combined systolic (congestive) and diastolic (congestive) heart failure: Secondary | ICD-10-CM

## 2014-09-01 DIAGNOSIS — I10 Essential (primary) hypertension: Secondary | ICD-10-CM

## 2014-09-01 MED ORDER — HYDRALAZINE HCL 50 MG PO TABS: 50.0000 mg | ORAL_TABLET | Freq: Two times a day (BID) | ORAL | Status: DC

## 2014-09-01 NOTE — Assessment & Plan Note (Signed)
The patient appears euvolemic.  Weight has decreased to baseline.  No LEE. She understands that she should be taking the lasix every day and not holding it.

## 2014-09-01 NOTE — Patient Instructions (Signed)
Your physician recommends that you schedule a follow-up appointment in: 3 MONTHS WITH DR BERRY  INCREASE HYDRALAZINE TO 50 MG TWICE DAILY = 2 OF THE 25 MG TABLETS TWICE DAILY

## 2014-09-01 NOTE — Progress Notes (Signed)
Date:  09/01/2014   ID:  [name], DOB Jan 20, 1931, MRN [id number]  PCP:  [name]., MD  Primary Cardiologist:  Allyson Sabal     History of Present Illness: [name] is a 80 y.o. female The patient returns today for followup. She is a 80 year old moderately overweight widowed [place] American female, mother of 35, grandmother to 63 grandchildren, who is accompanied by one of her sister's today. I saw her 6 months ago. She has a history of probable nonischemic cardiomyopathy with moderate LV dysfunction by 2D echocardiogram, last checked June 03, 2012, with an EF of 35% to 40%. She was catheterized by Dr. Daphene Jaeger, June 06, 2012, revealing a similar EF with no significant CAD, but this was in the setting of sepsis, requiring intubation, and congestive heart failure, responding to antibiotics and diuresis. Her other problems include hypertension, hyperlipidemia, and diabetes. She had a negative Myoview in July of 2009.   Patient was seen today for posthospital evaluation. She was hospitalized at the end of August beginning of September with pneumonia and congestive heart failure exacerbation. She reports that she had been away on vacation and had stopped taking her Lasix because she did not want consistently had to run to the bathroom. She understands she cannot do this anymore. Since her discharge she reports monitoring her weight, which has decreased from 214 pounds to 207. Lower extremity edema has resolved. She is having recurrence of the ulcerations on her legs which are being treated. They are currently wrapped in Ace bandages  The patient currently denies nausea, vomiting, fever, chest pain, shortness of breath, orthopnea, dizziness, PND, cough, congestion, abdominal pain, hematochezia, melena, lower extremity edema, claudication.  Wt Readings from Last 3 Encounters:  09/01/14 207 lb 4.8 oz (94.031 kg)  08/05/14 214 lb 11.2 oz (97.387 kg)  07/28/14 216 lb 2 oz (98.034 kg)     Past Medical History  Diagnosis Date  . Diabetes mellitus   . Hypertension   . Hyperlipemia   . Phlebitis 11/30/11    RLE  . Skin ulcer(s) 11/30/11    BLE; draining  . GERD (gastroesophageal reflux disease)   . Arthritis   . Anxiety   . Peripheral vascular disease   . Neuromuscular disorder     numbness feet  . Seizure, convulsive   . Cardiomyopathy, EF 35-40% 06/05/2012  . Diastolic dysfunction, left ventricle, grade 2 by echo 06/04/12 06/05/2012  . TR (tricuspid regurgitation), mod 06/05/2012  . Acute on chronic systolic and diastolic heart failure, NYHA class 2 06/13/2012  . H/O echocardiogram 06/03/2012    EF 35-40%; systolic function moderately reduced; grade 2 diastolic dysfunction; mild AV regurg.; mod MV regurg.; LA severly dilated; mild-mod tricuspid valve regurg (acute care setting)  . History of nuclear stress test 06/29/2008    normal pattern of perfusion; negative for ischemia; low risk scan  . Wears glasses   . Full dentures     Current Outpatient Prescriptions  Medication Sig Dispense Refill  . acidophilus (RISAQUAD) CAPS Take 1 capsule by mouth daily.      Marland Kitchen aspirin 81 MG chewable tablet Chew 81 mg by mouth daily.       . calcium-vitamin D (OSCAL WITH D) 500-200 MG-UNIT per tablet Take 1 tablet by mouth daily.      . Coenzyme Q10 (CO Q-10) 200 MG CAPS Take 200 mg by mouth daily.      . furosemide (LASIX) 40 MG tablet Take 40 mg by mouth daily.      Marland Kitchen  guaiFENesin-dextromethorphan (ROBITUSSIN DM) 100-10 MG/5ML syrup Take 5 mLs by mouth every 4 (four) hours as needed for cough.  118 mL  0  . hydrALAZINE (APRESOLINE) 50 MG tablet Take 1 tablet (50 mg total) by mouth 2 (two) times daily.  30 tablet  6  . levofloxacin (LEVAQUIN) 500 MG tablet Take 1 tablet (500 mg total) by mouth daily.  5 tablet  0  . losartan (COZAAR) 100 MG tablet Take 1 tablet (100 mg total) by mouth daily.  90 tablet  3  . metFORMIN (GLUCOPHAGE) 500 MG tablet Take 500 mg by mouth 2 (two) times daily with  a meal.      . metoprolol (LOPRESSOR) 50 MG tablet Take 1 tablet (50 mg total) by mouth 2 (two) times daily.  60 tablet  1  . Multiple Vitamin (MULTI-VITAMIN PO) Take 1 tablet by mouth daily.      . nitroGLYCERIN (NITROSTAT) 0.4 MG SL tablet Place 1 tablet (0.4 mg total) under the tongue every 5 (five) minutes as needed for chest pain (CP or SOB).  30 tablet  1  . omega-3 acid ethyl esters (LOVAZA) 1 G capsule Take 2 g by mouth daily.      Marland Kitchen omeprazole (PRILOSEC) 40 MG capsule Take 40 mg by mouth 2 (two) times daily.       . potassium chloride SA (K-DUR,KLOR-CON) 20 MEQ tablet Take 20 mEq by mouth daily.      . simvastatin (ZOCOR) 20 MG tablet Take 20 mg by mouth every evening.      . vitamin C (ASCORBIC ACID) 500 MG tablet Take 500 mg by mouth daily.      . Zinc 50 MG TABS Take 1 tablet by mouth daily.       No current facility-administered medications for this visit.   Facility-Administered Medications Ordered in Other Visits  Medication Dose Route Frequency Provider Last Rate Last Dose  . ceFAZolin (ANCEF) IVPB 1 g/50 mL premix  1 g Intravenous 60 min Pre-Op [employer], DO      . HYDROmorphone (DILAUDID) injection 0.25-0.5 mg  0.25-0.5 mg Intravenous Q5 min PRN Achille Rich, MD        Allergies:    Allergies  Allergen Reactions  . Lisinopril Other (See Comments)    cough    Social History:  The patient  reports that she has never smoked. She has never used smokeless tobacco. She reports that she does not drink alcohol or use illicit drugs.   Family history:   Family History  Problem Relation Age of Onset  . Heart failure Mother   . Cancer Mother   . Cancer Father   . Diabetes Brother   . Diabetes Sister   . Cancer Sister     ROS:  Please see the history of present illness.  All other systems reviewed and negative.   PHYSICAL EXAM: VS:  BP 150/70  Pulse 60  Ht 5\' 2"  (1.575 m)  Wt 207 lb 4.8 oz (94.031 kg)  BMI 37.91 kg/m2 obese, well developed, in no acute  distress HEENT: Pupils are equal round react to light accommodation extraocular movements are intact.  Neck:  No cervical lymphadenopathy. Cardiac: Regular rate and rhythm without murmurs rubs or gallops. Lungs:  clear to auscultation bilaterally, no wheezing, rhonchi or rales Ext: no lower extremity edema.  1+ radial  pulses. Skin: warm and dry Neuro:  Grossly normal  EKG:  None   ASSESSMENT AND PLAN:  Problem List Items Addressed This  Visit   Chronic combined systolic and diastolic heart failure     The patient appears euvolemic.  Weight has decreased to baseline.  No LEE. She understands that she should be taking the lasix every day and not holding it.     Relevant Medications      hydrALAZINE (APRESOLINE) tablet   Diastolic dysfunction, left ventricle, grade 2 by echo 06/04/12   Relevant Medications      hydrALAZINE (APRESOLINE) tablet   HTN (hypertension) - Primary (Chronic)     Increase hydralazine to 50mg  BID    Relevant Medications      hydrALAZINE (APRESOLINE) tablet   Obesity, Class II, BMI 35-39.9, with comorbidity     We discussed, again, her need to reduce sugar intake.  She apparently eats A LOT of peppermint candies.

## 2014-09-01 NOTE — Assessment & Plan Note (Signed)
We discussed, again, her need to reduce sugar intake.  She apparently eats A LOT of peppermint candies.

## 2014-09-01 NOTE — Assessment & Plan Note (Signed)
Increase hydralazine to 50mg  BID

## 2014-09-06 ENCOUNTER — Encounter (HOSPITAL_BASED_OUTPATIENT_CLINIC_OR_DEPARTMENT_OTHER): Payer: Medicare Other | Attending: Plastic Surgery

## 2014-09-06 DIAGNOSIS — L97919 Non-pressure chronic ulcer of unspecified part of right lower leg with unspecified severity: Secondary | ICD-10-CM | POA: Diagnosis not present

## 2014-09-06 DIAGNOSIS — L97929 Non-pressure chronic ulcer of unspecified part of left lower leg with unspecified severity: Secondary | ICD-10-CM | POA: Insufficient documentation

## 2014-09-13 DIAGNOSIS — L97919 Non-pressure chronic ulcer of unspecified part of right lower leg with unspecified severity: Secondary | ICD-10-CM | POA: Diagnosis not present

## 2014-09-13 DIAGNOSIS — L97929 Non-pressure chronic ulcer of unspecified part of left lower leg with unspecified severity: Secondary | ICD-10-CM | POA: Diagnosis not present

## 2014-09-13 NOTE — Progress Notes (Signed)
[facility]  NAMEMIKELL, FERRANDINO              ACCOUNT NO.:  [id number]  MEDICAL RECORD NO.:  [id number]      DATE OF BIRTH:  Aug 31, 1931  PHYSICIAN:  Wayland Denis, DO       VISIT DATE:  09/13/2014                                  OFFICE VISIT   HISTORY OF PRESENT ILLNESS:  The patient is an 80 year old female who is here for a followup on her bilateral lower extremity ulcers from chronic venous insufficiency and she does have diabetes.  The right leg is healed.  It is epithelialized.  It is a little pink but looks really good.  On the left leg, it is improved and smaller with some epithelializing.  Overall, it looks very good.  The periwound area shows no sign of infection and overall doing very well.  REVIEW OF SYSTEMS:  Negative.  No change in social history or medications.  PHYSICAL EXAMINATION:  GENERAL:  On exam, she is alert, oriented, cooperative, very pleasant. HEENT:  Pupils are equal. LUNGS:  Breathing is unlabored. HEART:  Rate is regular.  Pulses are present and equal bilaterally.  ASSESSMENT AND PLAN:  The right leg has healed.  The left leg is improving.  An Endoform was placed on the left with Kerlix and an Ace wrap and the right leg had the stocking placed.  She is given Endoform to do dressing changes at home.  We will see her back in 2 weeks, at which time, she will likely be healed.     Wayland Denis, DO     CS/MEDQ  D:  09/13/2014  T:  09/13/2014  Job:  [id number]

## 2014-09-17 ENCOUNTER — Ambulatory Visit (HOSPITAL_COMMUNITY)
Admission: RE | Admit: 2014-09-17 | Discharge: 2014-09-17 | Disposition: A | Discharge status: Home / Self Care | Payer: Medicare Other | Source: Ambulatory Visit | Attending: Internal Medicine | Admitting: Internal Medicine

## 2014-09-17 DIAGNOSIS — Z1231 Encounter for screening mammogram for malignant neoplasm of breast: Secondary | ICD-10-CM | POA: Diagnosis not present

## 2014-09-27 DIAGNOSIS — L97929 Non-pressure chronic ulcer of unspecified part of left lower leg with unspecified severity: Secondary | ICD-10-CM | POA: Diagnosis not present

## 2014-09-27 DIAGNOSIS — L97919 Non-pressure chronic ulcer of unspecified part of right lower leg with unspecified severity: Secondary | ICD-10-CM | POA: Diagnosis not present

## 2014-09-28 ENCOUNTER — Telehealth: Payer: Self-pay | Admitting: Physician Assistant

## 2014-09-28 MED ORDER — HYDRALAZINE HCL 50 MG PO TABS: 50.0000 mg | ORAL_TABLET | Freq: Two times a day (BID) | ORAL | Status: DC

## 2014-09-28 NOTE — Progress Notes (Signed)
[facility]  NAME:  [name], [name]                   ACCOUNT NO.:  MEDICAL RECORD NO.:  [id number]      DATE OF BIRTH:  December 24, 1930  PHYSICIAN:  Wayland Denis, DO       VISIT DATE:  09/27/2014                                  OFFICE VISIT   The patient is an 80 year old female, who is here for followup on her bilateral lower extremity chronic venous insufficiency ulcers.  The right side has healed, but has a little bit of just irritation from the wrap.  The left side is smaller and improving.  This is very encouraging.  There is no change in her medications or social history.  On exam, she is alert and oriented.  She is cooperative.  Pupils are equal.  Breathing is unlabored.  Heart rate is regular.  The right side has healed.  The left side has improved in smaller.  We will continue with collagen and unna boots to both sides, and we will see her back in a week.     Wayland Denis, DO     CS/MEDQ  D:  09/27/2014  T:  09/27/2014  Job:  [id number]

## 2014-09-28 NOTE — Telephone Encounter (Signed)
Spoke with pt, aware refill sent into the pharm

## 2014-09-28 NOTE — Telephone Encounter (Signed)
Pt wants to know if she can get her Hydralazine 50 mg in 90 days supply? Please call to [phone].Please call and let her know when you do this.

## 2014-10-04 ENCOUNTER — Encounter (HOSPITAL_BASED_OUTPATIENT_CLINIC_OR_DEPARTMENT_OTHER): Payer: Medicare Other | Attending: Plastic Surgery

## 2014-10-04 ENCOUNTER — Ambulatory Visit (INDEPENDENT_AMBULATORY_CARE_PROVIDER_SITE_OTHER): Payer: Medicare Other | Admitting: Podiatry

## 2014-10-04 DIAGNOSIS — L97929 Non-pressure chronic ulcer of unspecified part of left lower leg with unspecified severity: Secondary | ICD-10-CM | POA: Insufficient documentation

## 2014-10-04 DIAGNOSIS — E11622 Type 2 diabetes mellitus with other skin ulcer: Secondary | ICD-10-CM | POA: Diagnosis present

## 2014-10-04 DIAGNOSIS — L97919 Non-pressure chronic ulcer of unspecified part of right lower leg with unspecified severity: Secondary | ICD-10-CM | POA: Insufficient documentation

## 2014-10-04 DIAGNOSIS — B351 Tinea unguium: Secondary | ICD-10-CM

## 2014-10-04 DIAGNOSIS — E1141 Type 2 diabetes mellitus with diabetic mononeuropathy: Secondary | ICD-10-CM

## 2014-10-04 DIAGNOSIS — M2040 Other hammer toe(s) (acquired), unspecified foot: Secondary | ICD-10-CM

## 2014-10-04 DIAGNOSIS — M79673 Pain in unspecified foot: Secondary | ICD-10-CM

## 2014-10-05 NOTE — Progress Notes (Signed)
[facility]  NAMEISLEE, YENTER              ACCOUNT NO.:  [id number]  MEDICAL RECORD NO.:  [id number]      DATE OF BIRTH:  November 03, 1931  PHYSICIAN:  Wayland Denis, DO       VISIT DATE:  10/04/2014                                  OFFICE VISIT   The patient is an 80 year old female who is here for followup on her bilateral lower extremities.  The right side has completely healed.  The left side is improving at its level with the surrounding skin and is granulated.  It is now starting to epithelialize.  She has 1 little abrasion on the lateral aspect that is a little bit newer.  There is no change in her medications.  SOCIAL HISTORY:  Home Health has gone and she has been using the [employer].  PHYSICAL EXAMINATION:  She is alert, oriented and cooperative, not in any distress.  She is very pleasant as usual.  Her breathing is unlabored.  Her heart rate is regular.  Her pulses are present on the lower extremities.  The wound is described above.  We will continue with the Unna boot, but I placed an Endoform today.  We will do a compression stocking on the right and try and get the Juxta-Lite again.  There is no sign of infection and we will see her back in 1 week.     Wayland Denis, DO     CS/MEDQ  D:  10/04/2014  T:  10/05/2014  Job:  [id number]

## 2014-10-05 NOTE — Progress Notes (Signed)
Subjective:     Patient ID: [name], female   DOB: 06/18/1931, 80 y.o.   MRN: [id number]  HPIpatient presents with nail disease 1-5 both feet with thick yellow painful nailbeds that she cannot cut   Review of Systems     Objective:   Physical Exam Neurovascular status intact with thick yellow brittle nailbeds 1-5 of both feet    Assessment:     Mycotic nail infection with pain 1-5 both feet    Plan:     Debride painful nailbeds 1-5 both feet with no iatrogenic bleeding noted      

## 2014-10-11 ENCOUNTER — Telehealth: Payer: Self-pay | Admitting: *Deleted

## 2014-10-11 DIAGNOSIS — E11622 Type 2 diabetes mellitus with other skin ulcer: Secondary | ICD-10-CM | POA: Diagnosis not present

## 2014-10-11 DIAGNOSIS — L97929 Non-pressure chronic ulcer of unspecified part of left lower leg with unspecified severity: Secondary | ICD-10-CM | POA: Diagnosis not present

## 2014-10-11 DIAGNOSIS — L97919 Non-pressure chronic ulcer of unspecified part of right lower leg with unspecified severity: Secondary | ICD-10-CM | POA: Diagnosis not present

## 2014-10-11 NOTE — Telephone Encounter (Signed)
We received a fax but it seems that the fax is going to our old address.  I've gotten a call from the old office saying they keep getting a fax from [facility].  Wanted to ask you to please correct your records so we can get information you send in a timely fashion.  Our direct line [phone] phone number,  fax # [phone] at [facility],  She's no longer with [facility].

## 2014-10-11 NOTE — Progress Notes (Signed)
[facility]  NAMELEANE, [name]              ACCOUNT NO.:  [id number]  MEDICAL RECORD NO.:  [id number]      DATE OF BIRTH:  [birth year]  PHYSICIAN:  Wayland Denis, DO       VISIT DATE:  10/11/2014                                  OFFICE VISIT   The patient is an 80 year old female who is here for followup on her bilateral lower extremity chronic ulcers.  She is here with her daughter.  She has been using an Radio broadcast assistant on the left with the Endoform and on the right, a compression stocking.  Unfortunately, she has a little nick in the skin on that side, but it is very small.  The left side is showing signs of improvement.  There is no change in her medications or social history.  Home health is no longer coming.  REVIEW OF SYSTEMS:  Negative.  PHYSICAL EXAMINATION:  On exam, she is alert and oriented, very pleasant as usual.  She is well aware of her situation.  She states she is doing excellent with the protein and the vitamins and elevation, even putting her legs upon pillow.  Her breathing is unlabored.  Her heart rate is regular.  Her abdomen is soft.  The wounds; the left wound is improving. There is no sign of infection and swelling is minimal.  RECOMMENDATION:  Is to continue with Endoform and the Unna boot on the left.  An Endoform piece was placed on the right and she can change this daily.  Continue with the vitamins, protein, elevation; and we will see her back in a week.     Wayland Denis, DO     CS/MEDQ  D:  10/11/2014  T:  10/11/2014  Job:  [id number]

## 2014-10-18 DIAGNOSIS — L97929 Non-pressure chronic ulcer of unspecified part of left lower leg with unspecified severity: Secondary | ICD-10-CM | POA: Diagnosis not present

## 2014-10-18 DIAGNOSIS — E11622 Type 2 diabetes mellitus with other skin ulcer: Secondary | ICD-10-CM | POA: Diagnosis not present

## 2014-10-18 DIAGNOSIS — L97919 Non-pressure chronic ulcer of unspecified part of right lower leg with unspecified severity: Secondary | ICD-10-CM | POA: Diagnosis not present

## 2014-10-19 NOTE — Progress Notes (Signed)
[facility]  [name], [name]              ACCOUNT NO.:  [id number]  MEDICAL RECORD NO.:  [id number]      DATE OF BIRTH:  21-Aug-1931  PHYSICIAN:  Wayland Denis, DO       VISIT DATE:  10/18/2014                                  OFFICE VISIT   The patient is an 80 year old female who is here for followup on her bilateral lower extremity ulcers.  She is doing extremely well.  There has been no change in her medications or social history.  She is alert, oriented, cooperative, not in any distress.  She is very pleasant like usual and her daughter is with here.  She has actually healed over with epithelialization of all the areas.  She has got a little bit of intermittent 1 mm breakdowns in her skin on the right side due to the dryness  So, the plan will be Endoform on the left for finishing off 1 mm opening, Bag Balm on the right, the Unna boot on the left, compression on the right, and follow up in 1 week.     Wayland Denis, DO     CS/MEDQ  D:  10/18/2014  T:  10/19/2014  Job:  [id number]

## 2014-10-25 DIAGNOSIS — L97929 Non-pressure chronic ulcer of unspecified part of left lower leg with unspecified severity: Secondary | ICD-10-CM | POA: Diagnosis not present

## 2014-10-25 DIAGNOSIS — L97919 Non-pressure chronic ulcer of unspecified part of right lower leg with unspecified severity: Secondary | ICD-10-CM | POA: Diagnosis not present

## 2014-10-25 DIAGNOSIS — E11622 Type 2 diabetes mellitus with other skin ulcer: Secondary | ICD-10-CM | POA: Diagnosis not present

## 2014-10-26 NOTE — Progress Notes (Signed)
[facility]  NAMELESTIE, DAKIN              ACCOUNT NO.:  [id number]  MEDICAL RECORD NO.:  [id number]      DATE OF BIRTH:  1931-05-27  PHYSICIAN:  Wayland Denis, DO       VISIT DATE:  10/25/2014                                  OFFICE VISIT   The patient is an 80 year old female who is here for followup on her bilateral lower extremity chronic venous insufficiency ulcers.  The right has completely healed.  The left is still present and she has been using the [employer] with Endoform.  There was no change in her medications or social history.  On exam, she is alert, very pleasant as usual, oriented, cooperative, not in any distress.  The right foot is healed.  The left has a little bit of breakdown distally, not infected.  So, we will continue with the Endoform and Unna boot on the left.  The Juxta-Lite, take the Unna boot down in 1 week, put some more Endoform on with the Juxta-Lite and we will see her back in 2 weeks.     Wayland Denis, DO     CS/MEDQ  D:  10/25/2014  T:  10/25/2014  Job:  [id number]

## 2014-11-03 ENCOUNTER — Encounter: Payer: Self-pay | Admitting: Cardiovascular Disease

## 2014-11-03 ENCOUNTER — Ambulatory Visit (INDEPENDENT_AMBULATORY_CARE_PROVIDER_SITE_OTHER): Payer: Medicare Other | Admitting: Cardiovascular Disease

## 2014-11-03 VITALS — BP 164/69 | HR 66 | Ht 61.25 in | Wt 208.5 lb

## 2014-11-03 DIAGNOSIS — E785 Hyperlipidemia, unspecified: Secondary | ICD-10-CM | POA: Insufficient documentation

## 2014-11-03 DIAGNOSIS — I10 Essential (primary) hypertension: Secondary | ICD-10-CM

## 2014-11-03 DIAGNOSIS — I519 Heart disease, unspecified: Secondary | ICD-10-CM

## 2014-11-03 NOTE — Assessment & Plan Note (Signed)
On simvastatin 20 mg a day followed by her PCP. We will obtain her most recent lab work.

## 2014-11-03 NOTE — Assessment & Plan Note (Signed)
History of hypertension with blood pressure measured in our office today at 164/69.[name] She is on metoprolol, losartan and hydralazine. She admits to dietary indiscretion with regards to salt. Continue current meds at current doses

## 2014-11-03 NOTE — Assessment & Plan Note (Signed)
History of moderate LV dysfunction dysfunction by 2-D echocardiogram performed 06/03/12 with an ejection fraction of 35-40% along with mitral regurgitation. We will recheck a 2-D echocardiogram.

## 2014-11-03 NOTE — Patient Instructions (Addendum)
  We will see you back in follow up in 6 months with Judie Grieve and 1 year with Dr Allyson Sabal.  Dr Allyson Sabal has ordered:  Echocardiogram. Echocardiography is a painless test that uses sound waves to create images of your heart. It provides your doctor with information about the size and shape of your heart and how well your heart's chambers and valves are working. This procedure takes approximately one hour. There are no restrictions for this procedure.

## 2014-11-03 NOTE — Progress Notes (Signed)
11/03/2014 [name]   [birth year]  [id number]  Primary Physician Katherine GarnetSHELTON,KIMBERLY R., MD Primary Cardiologist: Katherine GessJonathan J. Horatio Bertz MD Roseanne RenoFACP,FACC,FAHA, FSCAI   HPI:  The patient returns today for followup. She is a 80 year old moderately overweight widowed [place] female, mother of 556, grandmother to 5612 grandchildren, who is accompanied by one of her sister's today. I saw her 12 months ago and she saw Katherine BienenstockBrian Hager PA-C 05/04/14.Marland Kitchen. She has a history of probable nonischemic cardiomyopathy with moderate LV dysfunction by 2D echocardiogram, last checked June 03, 2012, with an EF of 35% to 40%. She was catheterized by Dr. Daphene Jaegerom [name], June 06, 2012, revealing a similar EF with no significant CAD, but this was in the setting of sepsis, requiring intubation, and congestive heart failure, responding to antibiotics and diuresis. Her other problems include hypertension, hyperlipidemia, and diabetes. She had a negative Myoview in July of 2009. She is otherwise asymptomatic. Since I saw her one year ago she's remained clinically stable. She has had some venous stasis ulcers on her legs followed [facility] She[facility]air [name]   Current Outpatient Prescriptions  Medication Sig Dispense Refill  . acidophilus (RISAQUAD) CAPS Take 1 capsule by mouth daily.    Marland Kitchen. aspirin 81 MG chewable tablet Chew 81 mg by mouth daily.     . calcium-vitamin D (OSCAL WITH D) 500-200 MG-UNIT per tablet Take 1 tablet by mouth daily.    . Coenzyme Q10 (CO Q-10) 200 MG CAPS Take 200 mg by mouth daily.    Marland Kitchen. FeFum-FePoly-FA-B Cmp-C-Biot (INTEGRA PLUS) CAPS daily.     Marland Kitchen. FLUZONE HIGH-DOSE 0.5 ML SUSY   0  . furosemide (LASIX) 40 MG tablet Take 40 mg by mouth daily.    Marland Kitchen. guaiFENesin-dextromethorphan (ROBITUSSIN DM) 100-10 MG/5ML syrup Take 5 mLs by mouth every 4 (four) hours as needed for cough. 118 mL 0  . hydrALAZINE (APRESOLINE) 50 MG tablet Take 1 tablet (50 mg total) by mouth 2 (two) times  daily. 180 tablet 3  . levofloxacin (LEVAQUIN) 500 MG tablet Take 1 tablet (500 mg total) by mouth daily. 5 tablet 0  . losartan (COZAAR) 100 MG tablet Take 1 tablet (100 mg total) by mouth daily. 90 tablet 3  . metFORMIN (GLUCOPHAGE) 500 MG tablet Take 500 mg by mouth 2 (two) times daily with a meal.    . metoprolol (LOPRESSOR) 100 MG tablet   2  . metoprolol (LOPRESSOR) 50 MG tablet Take 1 tablet (50 mg total) by mouth 2 (two) times daily. 60 tablet 1  . Multiple Vitamin (MULTI-VITAMIN PO) Take 1 tablet by mouth daily.    . nitroGLYCERIN (NITROSTAT) 0.4 MG SL tablet Place 1 tablet (0.4 mg total) under the tongue every 5 (five) minutes as needed for chest pain (CP or SOB). 30 tablet 1  . omega-3 acid ethyl esters (LOVAZA) 1 G capsule Take 2 g by mouth daily.    Marland Kitchen. omeprazole (PRILOSEC) 40 MG capsule Take 40 mg by mouth 2 (two) times daily.     . potassium chloride SA (K-DUR,KLOR-CON) 20 MEQ tablet Take 20 mEq by mouth daily.    . simvastatin (ZOCOR) 20 MG tablet Take 20 mg by mouth every evening.    . vitamin C (ASCORBIC ACID) 500 MG tablet Take 500 mg by mouth daily.    . Zinc 50 MG TABS Take 1 tablet by mouth daily.     No current facility-administered medications for this visit.  Facility-Administered Medications Ordered in Other Visits  Medication Dose Route Frequency Provider Last Rate Last Dose  . ceFAZolin (ANCEF) IVPB 1 g/50 mL premix  1 g Intravenous 60 min Pre-Op [employer], DO      . HYDROmorphone (DILAUDID) injection 0.25-0.5 mg  0.25-0.5 mg Intravenous Q5 min PRN Achille Rich, MD        Allergies  Allergen Reactions  . Lisinopril Other (See Comments)    cough    History   Social History  . Marital Status: Widowed    Spouse Name: N/A    Number of Children: 6  . Years of Education: N/A   Occupational History  . Not on file.   Social History Main Topics  . Smoking status: Never Smoker   . Smokeless tobacco: Never Used  . Alcohol Use: No     Comment: 11/30/11  "used to drink; not much; don't drink anymore"  . Drug Use: No  . Sexual Activity: No   Other Topics Concern  . Not on file   Social History Narrative     Review of Systems: General: negative for chills, fever, night sweats or weight changes.  Cardiovascular: negative for chest pain, dyspnea on exertion, edema, orthopnea, palpitations, paroxysmal nocturnal dyspnea or shortness of breath Dermatological: negative for rash Respiratory: negative for cough or wheezing Urologic: negative for hematuria Abdominal: negative for nausea, vomiting, diarrhea, bright red blood per rectum, melena, or hematemesis Neurologic: negative for visual changes, syncope, or dizziness All other systems reviewed and are otherwise negative except as noted above.    Blood pressure 164/69, pulse 66, height 5' 1.25" (1.556 m), weight 208 lb 8 oz (94.575 kg).  General appearance: alert and no distress Neck: no adenopathy, no carotid bruit, no JVD, supple, symmetrical, trachea midline and thyroid not enlarged, symmetric, no tenderness/mass/nodules Lungs: clear to auscultation bilaterally Heart: regular rate and rhythm, S1, S2 normal, no murmur, click, rub or gallop Extremities: 2+ edema. Her legs are wrapped with compression bandages.  EKG not performed today  ASSESSMENT AND PLAN:   HTN (hypertension) History of hypertension with blood pressure measured in our office today at 164/69.Marland Kitchen She is on metoprolol, losartan and hydralazine. She admits to dietary indiscretion with regards to salt. Continue current meds at current doses  Cardiomyopathy, EF 35-40% History of moderate LV dysfunction dysfunction by 2-D echocardiogram performed 06/03/12 with an ejection fraction of 35-40% along with mitral regurgitation. We will recheck a 2-D echocardiogram.  Hyperlipidemia On simvastatin 20 mg a day followed by her PCP. We will obtain her most recent lab work.      Katherine Gess MD FACP,FACC,FAHA,  [facility] 11/03/2014 10:15 AM

## 2014-11-08 ENCOUNTER — Encounter (HOSPITAL_BASED_OUTPATIENT_CLINIC_OR_DEPARTMENT_OTHER): Payer: Medicare Other | Attending: Plastic Surgery

## 2014-11-08 DIAGNOSIS — E11622 Type 2 diabetes mellitus with other skin ulcer: Secondary | ICD-10-CM | POA: Diagnosis not present

## 2014-11-08 DIAGNOSIS — L98499 Non-pressure chronic ulcer of skin of other sites with unspecified severity: Secondary | ICD-10-CM | POA: Diagnosis not present

## 2014-11-08 DIAGNOSIS — I872 Venous insufficiency (chronic) (peripheral): Secondary | ICD-10-CM | POA: Insufficient documentation

## 2014-11-08 NOTE — Progress Notes (Signed)
[facility]  NAMEGARDENIA, BEIER              ACCOUNT NO.:  [id number]  MEDICAL RECORD NO.:  [id number]      DATE OF BIRTH:  1931/10/10  PHYSICIAN:  Wayland Denis, DO       VISIT DATE:  11/08/2014                                  OFFICE VISIT   The patient is an very sweet 80 year old lady, who is here for followup on her left lower extremity ulcers, chronic venous insufficiency.  She has undergone debridement with surgery in the past and healed.  She broke down and came back so she is almost healed again today.  There is no change in her medications and social history.  On exam, she is alert, oriented and cooperative, not in any distress.  She is very pleasant. Her breathing is unlabored.  Her heart rate is regular.  She is accompanied with her daughter.  She is wearing her Juxta-Lite on both legs.  There is no swelling.  There is hyperpigmentation from the previous graft site.  The wound is smaller.  She does have a new one that is an abrasion.  Endoform was placed.  She is to continue with elevation, multivitamin, vitamin C, zinc, protein and follow up in 1 week.  She is given enough Endoform left over in the packet to change it this week at least twice.     Wayland Denis, DO     CS/MEDQ  D:  11/08/2014  T:  11/08/2014  Job:  [id number]

## 2014-11-09 ENCOUNTER — Ambulatory Visit (HOSPITAL_COMMUNITY)
Admission: RE | Admit: 2014-11-09 | Discharge: 2014-11-09 | Disposition: A | Discharge status: Home / Self Care | Payer: Medicare Other | Source: Ambulatory Visit | Attending: Internal Medicine | Admitting: Internal Medicine

## 2014-11-09 DIAGNOSIS — I429 Cardiomyopathy, unspecified: Secondary | ICD-10-CM | POA: Diagnosis not present

## 2014-11-09 DIAGNOSIS — I519 Heart disease, unspecified: Secondary | ICD-10-CM | POA: Diagnosis not present

## 2014-11-09 DIAGNOSIS — I359 Nonrheumatic aortic valve disorder, unspecified: Secondary | ICD-10-CM

## 2014-11-09 NOTE — Progress Notes (Signed)
2D Echo Performed 12/30/2013    Corbet Hanley, RCS  

## 2014-11-11 ENCOUNTER — Encounter (HOSPITAL_COMMUNITY): Payer: Self-pay | Admitting: Cardiovascular Disease

## 2014-11-12 ENCOUNTER — Ambulatory Visit: Payer: Medicare Other

## 2014-11-15 DIAGNOSIS — L98499 Non-pressure chronic ulcer of skin of other sites with unspecified severity: Secondary | ICD-10-CM | POA: Diagnosis not present

## 2014-11-15 DIAGNOSIS — I872 Venous insufficiency (chronic) (peripheral): Secondary | ICD-10-CM | POA: Diagnosis not present

## 2014-11-15 DIAGNOSIS — E11622 Type 2 diabetes mellitus with other skin ulcer: Secondary | ICD-10-CM | POA: Diagnosis not present

## 2014-11-16 NOTE — Progress Notes (Signed)
[facility]  NAME:  [name], [name]                   ACCOUNT NO.:  MEDICAL RECORD NO.:  [id number]      DATE OF BIRTH:  12/22/1930  PHYSICIAN:  Wayland Denis, DO       VISIT DATE:  11/15/2014                                  OFFICE VISIT   The patient is an 80 year old female who is here for followup on her bilateral lower extremity ulcers.  The right is healed.  The left is doing much better even than it was last week.  It is flushed with the skin and granulating, pink.  No sign of infection.  No change in medications or social history.  She is going to spend the holiday with her daughter who is here with her and very involved in her care.  On exam, she is alert, oriented, cooperative, not in any distress. Breathing is unlabored.  Heart rate is regular.  The wound does not appear to be infected and as described above, an Endoform was placed and the extra given to her so she can do the dressing changes throughout the week, and we will see her back in January.     Wayland Denis, DO     CS/MEDQ  D:  11/15/2014  T:  11/16/2014  Job:  [id number]

## 2014-12-06 ENCOUNTER — Encounter (HOSPITAL_BASED_OUTPATIENT_CLINIC_OR_DEPARTMENT_OTHER): Payer: Medicare Other | Attending: Plastic Surgery

## 2014-12-06 DIAGNOSIS — L97929 Non-pressure chronic ulcer of unspecified part of left lower leg with unspecified severity: Secondary | ICD-10-CM | POA: Diagnosis not present

## 2014-12-06 DIAGNOSIS — L97919 Non-pressure chronic ulcer of unspecified part of right lower leg with unspecified severity: Secondary | ICD-10-CM | POA: Diagnosis not present

## 2014-12-06 DIAGNOSIS — I872 Venous insufficiency (chronic) (peripheral): Secondary | ICD-10-CM | POA: Diagnosis not present

## 2014-12-07 NOTE — Progress Notes (Signed)
[facility]  NAMEDECIMA, WIGAND              ACCOUNT NO.:  [id number]  MEDICAL RECORD NO.:  [id number]      DATE OF BIRTH:  Apr 09, 1931  PHYSICIAN:  Wayland Denis, DO       VISIT DATE:  12/06/2014                                  OFFICE VISIT   The patient is an 81 year old female who is here for followup on her bilateral lower extremity ulcers.  Her right side has completely healed. The left side has improved but has not closed up yet.  There has been no change in her medications or her social history.  She is alert, oriented, cooperative, not in any distress.  She is pleasant.  Her breathing is unlabored.  Her heart rate is regular.  Her pulse is present.  There is no sign of infection.  There is no fibrous tissue.  It is clean and it is improving.  So, an Endoform was placed and the plan will be for her to continue with the Endoform and follow up in 2 weeks.     Wayland Denis, DO     CS/MEDQ  D:  12/06/2014  T:  12/07/2014  Job:  [id number]

## 2014-12-20 DIAGNOSIS — I872 Venous insufficiency (chronic) (peripheral): Secondary | ICD-10-CM | POA: Diagnosis not present

## 2014-12-20 DIAGNOSIS — L97919 Non-pressure chronic ulcer of unspecified part of right lower leg with unspecified severity: Secondary | ICD-10-CM | POA: Diagnosis not present

## 2014-12-20 DIAGNOSIS — L97929 Non-pressure chronic ulcer of unspecified part of left lower leg with unspecified severity: Secondary | ICD-10-CM | POA: Diagnosis not present

## 2014-12-21 NOTE — Progress Notes (Signed)
[facility]  NAME:  [name], [name]                   ACCOUNT NO.:  MEDICAL RECORD NO.:  [id number]      DATE OF BIRTH:  07-16-1931  PHYSICIAN:  Wayland Denis, DO       VISIT DATE:  12/20/2014                                  OFFICE VISIT   The patient is an 81 year old female here for followup on her left lower extremity chronic venous insufficiency ulcer.  She has been putting Endoform on it and it is smaller.  The upper ulcer has completely healed.  It is filling in and epithelializing.  There is no sign of infection.  Her breathing is unlabored.  Her heart rate is regular. Pulses present.  Her pain is well controlled.  Socially, her daughter is helping her out quite a bit.  So, we will continue with the Endoform elevation, Juxta-Lite, multivitamin, vitamin C.  Blood sugar controlled and we will see her back in 2 weeks.     Wayland Denis, DO     CS/MEDQ  D:  12/20/2014  T:  12/20/2014  Job:  [id number]

## 2015-01-03 ENCOUNTER — Encounter (HOSPITAL_BASED_OUTPATIENT_CLINIC_OR_DEPARTMENT_OTHER): Payer: Medicare Other | Attending: Plastic Surgery

## 2015-01-03 ENCOUNTER — Ambulatory Visit (INDEPENDENT_AMBULATORY_CARE_PROVIDER_SITE_OTHER): Payer: 59 | Admitting: Podiatry

## 2015-01-03 DIAGNOSIS — E1141 Type 2 diabetes mellitus with diabetic mononeuropathy: Secondary | ICD-10-CM

## 2015-01-03 DIAGNOSIS — B351 Tinea unguium: Secondary | ICD-10-CM

## 2015-01-03 DIAGNOSIS — G629 Polyneuropathy, unspecified: Secondary | ICD-10-CM

## 2015-01-03 DIAGNOSIS — I872 Venous insufficiency (chronic) (peripheral): Secondary | ICD-10-CM | POA: Diagnosis not present

## 2015-01-03 DIAGNOSIS — L97929 Non-pressure chronic ulcer of unspecified part of left lower leg with unspecified severity: Secondary | ICD-10-CM | POA: Insufficient documentation

## 2015-01-03 DIAGNOSIS — E1142 Type 2 diabetes mellitus with diabetic polyneuropathy: Secondary | ICD-10-CM

## 2015-01-03 DIAGNOSIS — M79673 Pain in unspecified foot: Secondary | ICD-10-CM

## 2015-01-03 NOTE — Progress Notes (Signed)
Subjective:     Patient ID: [name], female   DOB: 02/22/1931, 81 y.o.   MRN: [id number]  HPI patient presents for diabetic shoes secondary to long-term diabetes with diminished circulatory status and neurological like deficit with at risk digital deformity and also noted to have nail disease 1-5 both feet that's thick with yellow like debris and pain within the nailbeds   Review of Systems     Objective:   Physical Exam At risk diabetic with mycotic nail infection and long-term structural changes controlled with diabetic shoes    Assessment:     Mycotic nail infections 1-5 both feet with brittle yellow painful nailbeds that she cannot take care of along with at risk neurological vascular disease    Plan:     Debridement nailbeds 1-5 both feet with no iatrogenic bleeding noted and instructed on diabetic shoes with distention of diabetic shoes which fit well and patient's very pleased      

## 2015-01-03 NOTE — Patient Instructions (Signed)

## 2015-01-03 NOTE — Progress Notes (Deleted)
Subjective:     Patient ID: [name], female   DOB: 03/27/1931, 81 y.o.   MRN: [id number]  HPIpatient presents with nail disease 1-5 both feet with thick yellow painful nailbeds that she cannot cut   Review of Systems     Objective:   Physical Exam Neurovascular status intact with thick yellow brittle nailbeds 1-5 of both feet    Assessment:     Mycotic nail infection with pain 1-5 both feet    Plan:     Debride painful nailbeds 1-5 both feet with no iatrogenic bleeding noted      

## 2015-01-03 NOTE — Progress Notes (Signed)
Subjective:     Patient ID: [name], female   DOB: 12/21/1930, 81 y.o.   MRN: [id number]  HPIpatient presents with nail disease 1-5 both feet with thick yellow painful nailbeds that she cannot cut   Review of Systems     Objective:   Physical Exam Neurovascular status intact with thick yellow brittle nailbeds 1-5 of both feet    Assessment:     Mycotic nail infection with pain 1-5 both feet    Plan:     Debride painful nailbeds 1-5 both feet with no iatrogenic bleeding noted

## 2015-01-04 NOTE — Progress Notes (Signed)
[facility]  NAMEHAVILAND, KEAGY              ACCOUNT NO.:  [id number]  MEDICAL RECORD NO.:  [id number]      DATE OF BIRTH:  1931/05/11  PHYSICIAN:  Wayland Denis, DO       VISIT DATE:  01/03/2015                                  OFFICE VISIT   The patient is an 81 year old female who is here for followup on her left lower extremity chronic venous insufficiency ulcer.  She had been using some collagen and we used Endoform in the past.  She started the Juxta Lite.  There has been no change in her medications or social history.  Review of systems is otherwise negative.  On exam, she is alert, oriented, very pleasant.  Her breathing is unlabored.  Her heart rate is regular.  The wound is completely healed.  She is wearing the Juxta Lite regularly, so we talked about lotion, elevation, and continuing with the Juxta Lite and follow up as needed.     Wayland Denis, DO     CS/MEDQ  D:  01/03/2015  T:  01/04/2015  Job:  [id number]

## 2015-01-27 ENCOUNTER — Telehealth: Payer: Self-pay | Admitting: Cardiovascular Disease

## 2015-01-27 NOTE — Telephone Encounter (Signed)
[name] is calling to see if [name] is a candidate Ace/Arb. Please Call  Thanks

## 2015-01-27 NOTE — Telephone Encounter (Signed)
Returned call to [facility] case Production designer, theatre/television/film. Noted that patient is on losartan 100mg  daily. Inquiry resolved.

## 2015-01-31 ENCOUNTER — Other Ambulatory Visit: Payer: Self-pay

## 2015-03-16 ENCOUNTER — Other Ambulatory Visit: Payer: Self-pay | Admitting: Cardiovascular Disease

## 2015-03-28 ENCOUNTER — Other Ambulatory Visit: Payer: Self-pay

## 2015-03-28 MED ORDER — FUROSEMIDE 40 MG PO TABS: 40.0000 mg | ORAL_TABLET | Freq: Every day | ORAL | Status: DC

## 2015-03-28 NOTE — Telephone Encounter (Signed)
Rx(s) sent to pharmacy electronically.  

## 2015-04-04 ENCOUNTER — Other Ambulatory Visit: Payer: Self-pay

## 2015-04-08 ENCOUNTER — Ambulatory Visit (INDEPENDENT_AMBULATORY_CARE_PROVIDER_SITE_OTHER): Payer: Medicare Other

## 2015-04-08 DIAGNOSIS — M79673 Pain in unspecified foot: Secondary | ICD-10-CM | POA: Diagnosis not present

## 2015-04-08 DIAGNOSIS — B351 Tinea unguium: Secondary | ICD-10-CM | POA: Diagnosis not present

## 2015-04-08 NOTE — Progress Notes (Signed)
Subjective:     Patient ID: [name], female   DOB: 17-Nov-1931, 81 y.o.   MRN: [id number]  HPI patient presents for diabetic shoes secondary to long-term diabetes with diminished circulatory status and neurological like deficit with at risk digital deformity and also noted to have nail disease 1-5 both feet that's thick with yellow like debris and pain within the nailbeds   Review of Systems     Objective:   Physical Exam At risk diabetic with mycotic nail infection and long-term structural changes controlled with diabetic shoes    Assessment:     Mycotic nail infections 1-5 both feet with brittle yellow painful nailbeds that she cannot take care of along with at risk neurological vascular disease    Plan:     Debridement nailbeds 1-5 both feet with no iatrogenic bleeding noted and instructed on diabetic shoes with distention of diabetic shoes which fit well and patient's very pleased

## 2015-04-22 ENCOUNTER — Ambulatory Visit (INDEPENDENT_AMBULATORY_CARE_PROVIDER_SITE_OTHER): Payer: Medicare Other | Admitting: Physician Assistant

## 2015-04-22 ENCOUNTER — Telehealth: Payer: Self-pay | Admitting: Physician Assistant

## 2015-04-22 ENCOUNTER — Encounter: Payer: Self-pay | Admitting: Physician Assistant

## 2015-04-22 VITALS — BP 180/76 | HR 63 | Ht 62.0 in | Wt 217.5 lb

## 2015-04-22 DIAGNOSIS — IMO0001 Reserved for inherently not codable concepts without codable children: Secondary | ICD-10-CM

## 2015-04-22 DIAGNOSIS — I5042 Chronic combined systolic (congestive) and diastolic (congestive) heart failure: Secondary | ICD-10-CM | POA: Diagnosis not present

## 2015-04-22 DIAGNOSIS — I10 Essential (primary) hypertension: Secondary | ICD-10-CM

## 2015-04-22 DIAGNOSIS — I2583 Coronary atherosclerosis due to lipid rich plaque: Secondary | ICD-10-CM

## 2015-04-22 DIAGNOSIS — E785 Hyperlipidemia, unspecified: Secondary | ICD-10-CM

## 2015-04-22 DIAGNOSIS — I2510 Atherosclerotic heart disease of native coronary artery without angina pectoris: Secondary | ICD-10-CM

## 2015-04-22 NOTE — Telephone Encounter (Signed)
Pt was here this morning to see Bryan and blood pressure was high. She is at home now,it is 114/70 pulse rate is 59.

## 2015-04-22 NOTE — Assessment & Plan Note (Signed)
No complaints of angina.  EF improved to 55-60% as of last Dec.

## 2015-04-22 NOTE — Telephone Encounter (Signed)
Home BP reviewed.-Thanks!  Wilburt Finlay PAC

## 2015-04-22 NOTE — Patient Instructions (Signed)
Check blood pressure when you get home and call office with your results.  NO CHANGE IN CURRENT MEDICATIONS   Your physician wants you to follow-up in 6 MONTHS WITH  Dr Allyson Sabal.    You will receive a reminder letter in the mail two months in advance. If you don't receive a letter, please call our office to schedule the follow-up appointment.

## 2015-04-22 NOTE — Telephone Encounter (Signed)
Forward to [employer] PA

## 2015-04-22 NOTE — Progress Notes (Addendum)
Patient ID: [name], female   DOB: [birth year], 81 y.o.   MRN: [id number]    Date:  04/22/2015   ID:  [name], DOB 08-20-1931, MRN [id number]  PCP:  [name]., MD  Primary Cardiologist:  Allyson Sabal  Chief Complaint  Patient presents with  . 6 month visit    primary card. Dr Berry-----no chest pain , no swelling , no sob, pain in knees with walking, no leg pain with walking     History of Present Illness: [name] is a 81 y.o. female  She is a 81 year old moderately overweight widowed African American female, mother of 22, grandmother to 32  She has a history of probable nonischemic cardiomyopathy with moderate LV dysfunction by 2D echocardiogram, last checked June 03, 2012, with an EF of 35% to 40%. She was catheterized by Dr. Daphene Jaeger, June 06, 2012, revealing a similar EF with no significant CAD, but this was in the setting of sepsis, requiring intubation, and congestive heart failure, responding to antibiotics and diuresis. Her other problems include hypertension, hyperlipidemia, and diabetes. She had a negative Myoview in July of 2009.  She has had some venous stasis ulcers on her legs followed by Dr. Shella Spearing at the [facility] wound care clinic.  The patient had a new 2D echo in Dec 2015 which revealed an EF of 55-60%, G1DD, moderate AI, mild MR  Resents for six-month evaluation.  She reports doing well with no specific complaints. Due to venous ulcers previously she wears compression wraps all the time.    The patient currently denies nausea, vomiting, fever, chest pain, shortness of breath, orthopnea, dizziness, PND, cough, congestion, abdominal pain, hematochezia, melena, lower extremity edema  Wt Readings from Last 3 Encounters:  04/22/15 217 lb 8 oz (98.657 kg)  11/03/14 208 lb 8 oz (94.575 kg)  09/01/14 207 lb 4.8 oz (94.031 kg)     Past Medical History  Diagnosis Date  . Diabetes mellitus   . Hypertension   . Hyperlipemia   .  Phlebitis 11/30/11    RLE  . Skin ulcer(s) 11/30/11    BLE; draining  . GERD (gastroesophageal reflux disease)   . Arthritis   . Anxiety   . Peripheral vascular disease   . Neuromuscular disorder     numbness feet  . Seizure, convulsive   . Cardiomyopathy, EF 35-40% 06/05/2012  . Diastolic dysfunction, left ventricle, grade 2 by echo 06/04/12 06/05/2012  . TR (tricuspid regurgitation), mod 06/05/2012  . Acute on chronic systolic and diastolic heart failure, NYHA class 2 06/13/2012  . H/O echocardiogram 06/03/2012    EF 35-40%; systolic function moderately reduced; grade 2 diastolic dysfunction; mild AV regurg.; mod MV regurg.; LA severly dilated; mild-mod tricuspid valve regurg (acute care setting)  . History of nuclear stress test 06/29/2008    normal pattern of perfusion; negative for ischemia; low risk scan  . Wears glasses   . Full dentures     Current Outpatient Prescriptions  Medication Sig Dispense Refill  . acidophilus (RISAQUAD) CAPS Take 1 capsule by mouth daily.    Marland Kitchen aspirin 81 MG chewable tablet Chew 81 mg by mouth daily.     . calcium-vitamin D (OSCAL WITH D) 500-200 MG-UNIT per tablet Take 1 tablet by mouth daily.    . Coenzyme Q10 (CO Q-10) 200 MG CAPS Take 200 mg by mouth daily.    Marland Kitchen FeFum-FePoly-FA-B Cmp-C-Biot (INTEGRA PLUS) CAPS daily.     . furosemide (LASIX) 40  MG tablet Take 1 tablet (40 mg total) by mouth daily. 90 tablet 2  . guaiFENesin-dextromethorphan (ROBITUSSIN DM) 100-10 MG/5ML syrup Take 5 mLs by mouth every 4 (four) hours as needed for cough. 118 mL 0  . hydrALAZINE (APRESOLINE) 50 MG tablet Take 1 tablet (50 mg total) by mouth 2 (two) times daily. 180 tablet 3  . KLOR-CON M20 20 MEQ tablet TAKE 1 TABLET BY MOUTH DAILY 90 tablet 1  . losartan (COZAAR) 100 MG tablet Take 1 tablet (100 mg total) by mouth daily. 90 tablet 3  . metFORMIN (GLUCOPHAGE) 500 MG tablet Take 500 mg by mouth 2 (two) times daily with a meal.    . metoprolol (LOPRESSOR) 50 MG tablet Take 1  tablet (50 mg total) by mouth 2 (two) times daily. 60 tablet 1  . Multiple Vitamin (MULTI-VITAMIN PO) Take 1 tablet by mouth daily.    . nitroGLYCERIN (NITROSTAT) 0.4 MG SL tablet Place 1 tablet (0.4 mg total) under the tongue every 5 (five) minutes as needed for chest pain (CP or SOB). 30 tablet 1  . omega-3 acid ethyl esters (LOVAZA) 1 G capsule Take 2 g by mouth daily.    Marland Kitchen omeprazole (PRILOSEC) 40 MG capsule Take 40 mg by mouth 2 (two) times daily.     . simvastatin (ZOCOR) 20 MG tablet Take 20 mg by mouth every evening.    . vitamin C (ASCORBIC ACID) 500 MG tablet Take 500 mg by mouth daily.    . Zinc 50 MG TABS Take 1 tablet by mouth daily.     No current facility-administered medications for this visit.   Facility-Administered Medications Ordered in Other Visits  Medication Dose Route Frequency Provider Last Rate Last Dose  . ceFAZolin (ANCEF) IVPB 1 g/50 mL premix  1 g Intravenous 60 min Pre-Op [employer], DO      . HYDROmorphone (DILAUDID) injection 0.25-0.5 mg  0.25-0.5 mg Intravenous Q5 min PRN Achille Rich, MD        Allergies:    Allergies  Allergen Reactions  . Lisinopril Other (See Comments)    cough    Social History:  The patient  reports that she has never smoked. She has never used smokeless tobacco. She reports that she does not drink alcohol or use illicit drugs.   Family history:   Family History  Problem Relation Age of Onset  . Heart failure Mother   . Cancer Mother   . Cancer Father   . Diabetes Brother   . Diabetes Sister   . Cancer Sister     ROS:  Please see the history of present illness.  All other systems reviewed and negative.   PHYSICAL EXAM: VS:  BP 180/76 mmHg  Pulse 63  Ht  (1.575 m)  Wt 217 lb 8 oz (98.657 kg)  BMI 39.77 kg/m2 Morbidly obese, well developed, in no acute distress HEENT: Pupils are equal round react to light accommodation extraocular movements are intact.  Neck: no JVDNo cervical lymphadenopathy. Cardiac:  Regular rate and rhythm without murmurs rubs or gallops. Lungs:  clear to auscultation bilaterally, no wheezing, rhonchi or rales Abd: soft, nontender, positive bowel sounds all quadrants, no hepatosplenomegaly Ext: no lower extremity edema.  2+ radial and dorsalis pedis pulses. Skin: warm and dry Neuro:  Grossly normal  EKG:   NSR 63BPM  ASSESSMENT AND PLAN:  Problem List Items Addressed This Visit    Obesity, Class II, BMI 35-39.9, with comorbidity    I suggested water  aerobics as a way to get low impact exercise.      Hyperlipidemia    On statin      HTN (hypertension) - Primary (Chronic)    The patient reports her BP was 130/70 at home this morning. She will repeat and monitor at home.  No changes now.       Chronic combined systolic and diastolic heart failure    Weight increased since December, however, the patient reports it has been stable around 210.  She appears euvolemic on exam.  Continue weight monitoring and low sodium diet.      CAD (coronary artery disease), mild, 20% cath 06/06/12 (Chronic)    No complaints of angina.  EF improved to 55-60% as of last Dec.      Relevant Orders   EKG 12-Lead

## 2015-04-22 NOTE — Assessment & Plan Note (Signed)
I suggested water aerobics as a way to get low impact exercise.

## 2015-04-22 NOTE — Assessment & Plan Note (Signed)
Weight increased since December, however, the patient reports it has been stable around 210.  She appears euvolemic on exam.  Continue weight monitoring and low sodium diet.

## 2015-04-22 NOTE — Assessment & Plan Note (Signed)
The patient reports her BP was 130/70 at home this morning. She will repeat and monitor at home.  No changes now.

## 2015-04-22 NOTE — Assessment & Plan Note (Signed)
On statin.

## 2015-04-22 NOTE — Addendum Note (Signed)
Addended by: Dwana Melena on: 04/22/2015 08:39 AM   Modules accepted: Orders, Medications

## 2015-04-28 ENCOUNTER — Other Ambulatory Visit: Payer: Self-pay | Admitting: Cardiovascular Disease

## 2015-04-28 NOTE — Telephone Encounter (Signed)
Rx(s) sent to pharmacy electronically.  

## 2015-06-27 ENCOUNTER — Other Ambulatory Visit: Payer: Self-pay | Admitting: *Deleted

## 2015-06-27 MED ORDER — POTASSIUM CHLORIDE CRYS ER 20 MEQ PO TBCR: 20.0000 meq | EXTENDED_RELEASE_TABLET | Freq: Every day | ORAL | Status: DC

## 2015-06-27 MED ORDER — FUROSEMIDE 40 MG PO TABS: 40.0000 mg | ORAL_TABLET | Freq: Every day | ORAL | Status: DC

## 2015-06-27 MED ORDER — NITROGLYCERIN 0.4 MG SL SUBL: 0.4000 mg | SUBLINGUAL_TABLET | SUBLINGUAL | Status: DC | PRN

## 2015-06-27 MED ORDER — LOSARTAN POTASSIUM 100 MG PO TABS: ORAL_TABLET | ORAL | Status: DC

## 2015-06-27 MED ORDER — METOPROLOL TARTRATE 100 MG PO TABS: 50.0000 mg | ORAL_TABLET | Freq: Two times a day (BID) | ORAL | Status: DC

## 2015-06-27 MED ORDER — HYDRALAZINE HCL 50 MG PO TABS: 50.0000 mg | ORAL_TABLET | Freq: Two times a day (BID) | ORAL | Status: DC

## 2015-06-27 NOTE — Telephone Encounter (Signed)
Rx(s) sent to pharmacy electronically.  

## 2015-07-08 ENCOUNTER — Ambulatory Visit: Payer: Medicare Other | Admitting: Podiatry

## 2015-07-15 ENCOUNTER — Encounter: Payer: Self-pay | Admitting: Podiatry

## 2015-07-15 ENCOUNTER — Ambulatory Visit (INDEPENDENT_AMBULATORY_CARE_PROVIDER_SITE_OTHER): Payer: Medicare Other | Admitting: Podiatry

## 2015-07-15 VITALS — BP 139/76 | HR 64 | Resp 18

## 2015-07-15 DIAGNOSIS — M79673 Pain in unspecified foot: Secondary | ICD-10-CM | POA: Diagnosis not present

## 2015-07-15 DIAGNOSIS — B351 Tinea unguium: Secondary | ICD-10-CM

## 2015-07-15 DIAGNOSIS — G629 Polyneuropathy, unspecified: Secondary | ICD-10-CM

## 2015-07-15 DIAGNOSIS — E1142 Type 2 diabetes mellitus with diabetic polyneuropathy: Secondary | ICD-10-CM

## 2015-07-15 NOTE — Progress Notes (Signed)
Patient ID: [name], female   DOB: 1930-12-15, 81 y.o.   MRN: [id number] Complaint:  Visit Type: Patient returns to my office for continued preventative foot care services. Complaint: Patient states" my nails have grown long and thick and become painful to walk and wear shoes" Patient has been diagnosed with DM with neuropathy.. The patient presents for preventative foot care services. No changes to ROS  Podiatric Exam: Vascular: dorsalis pedis and posterior tibial pulses are palpable bilateral. Capillary return is immediate. Temperature gradient is WNL. Skin turgor WNL  Sensorium: Absent Semmes Weinstein monofilament test. Normal tactile sensation. Nail Exam: Pt has thick disfigured discolored nails with subungual debris noted bilateral entire nail hallux through fifth toenails Ulcer Exam: There is no evidence of ulcer or pre-ulcerative changes or infection. Orthopedic Exam: Muscle tone and strength are WNL. No limitations in general ROM. No crepitus or effusions noted. Foot type and digits show no abnormalities. Bony prominences are unremarkable. Skin: No Porokeratosis. No infection or ulcers  Diagnosis:  Onychomycosis, , Pain in right toe, pain in left toes  Treatment & Plan Procedures and Treatment: Consent by patient was obtained for treatment procedures. The patient understood the discussion of treatment and procedures well. All questions were answered thoroughly reviewed. Debridement of mycotic and hypertrophic toenails, 1 through 5 bilateral and clearing of subungual debris. No ulceration, no infection noted.  Return Visit-Office Procedure: Patient instructed to return to the office for a follow up visit 3 months for continued evaluation and treatment.

## 2015-08-01 ENCOUNTER — Telehealth: Payer: Self-pay | Admitting: Cardiovascular Disease

## 2015-08-02 NOTE — Telephone Encounter (Signed)
Close encounter 

## 2015-08-11 ENCOUNTER — Other Ambulatory Visit: Payer: Self-pay | Admitting: *Deleted

## 2015-08-11 MED ORDER — NITROGLYCERIN 0.4 MG SL SUBL: 0.4000 mg | SUBLINGUAL_TABLET | SUBLINGUAL | Status: DC | PRN

## 2015-08-26 ENCOUNTER — Other Ambulatory Visit: Payer: Self-pay

## 2015-08-26 ENCOUNTER — Telehealth: Payer: Self-pay | Admitting: Cardiovascular Disease

## 2015-08-26 DIAGNOSIS — Z1231 Encounter for screening mammogram for malignant neoplasm of breast: Secondary | ICD-10-CM

## 2015-08-26 NOTE — Telephone Encounter (Signed)
Patient will increase her lasix by one 40 mg tablet daily for 3 days

## 2015-08-26 NOTE — Telephone Encounter (Signed)
That is fine 

## 2015-08-26 NOTE — Telephone Encounter (Signed)
Weight increase from 214 lbs to 217 lbs over two days  Was instructed to call when weight increased 3 ls or > in 3 days  Patient said that Dr. Allyson Sabal has told her in the past to take an extra lasix usually for three days  Told patient I would send her message to Dr. Allyson Sabal and patient is going to take an extra Lasix dose today  Will route to Dr. Allyson Sabal and Dr. Antoine Poche DOD

## 2015-08-26 NOTE — Telephone Encounter (Signed)
Pt says she is 3lbs over her normal weight,please call to advise.She was told to call when she is 3lbs over for 3 days,tomorrow will be 3 days.

## 2015-09-09 ENCOUNTER — Ambulatory Visit
Admission: RE | Admit: 2015-09-09 | Discharge: 2015-09-09 | Disposition: A | Discharge status: Home / Self Care | Payer: Medicare Other | Source: Ambulatory Visit

## 2015-09-09 DIAGNOSIS — Z1231 Encounter for screening mammogram for malignant neoplasm of breast: Secondary | ICD-10-CM

## 2015-10-05 ENCOUNTER — Encounter: Payer: Self-pay | Admitting: Podiatry

## 2015-10-05 ENCOUNTER — Ambulatory Visit (INDEPENDENT_AMBULATORY_CARE_PROVIDER_SITE_OTHER): Payer: Medicare Other | Admitting: Podiatry

## 2015-10-05 DIAGNOSIS — B351 Tinea unguium: Secondary | ICD-10-CM

## 2015-10-05 DIAGNOSIS — M79673 Pain in unspecified foot: Secondary | ICD-10-CM | POA: Diagnosis not present

## 2015-10-05 DIAGNOSIS — E1142 Type 2 diabetes mellitus with diabetic polyneuropathy: Secondary | ICD-10-CM | POA: Diagnosis not present

## 2015-10-05 NOTE — Progress Notes (Signed)
Patient ID: [name], female   DOB: 1931/05/24, 81 y.o.   MRN: [id number] Complaint:  Visit Type: Patient returns to my office for continued preventative foot care services. Complaint: Patient states" my nails have grown long and thick and become painful to walk and wear shoes" Patient has been diagnosed with DM with neuropathy.. The patient presents for preventative foot care services. No changes to ROS  Podiatric Exam: Vascular: dorsalis pedis and posterior tibial pulses are palpable bilateral. Capillary return is immediate. Temperature gradient is WNL. Skin turgor WNL  Sensorium: Absent Semmes Weinstein monofilament test. Normal tactile sensation. Nail Exam: Pt has thick disfigured discolored nails with subungual debris noted bilateral entire nail hallux through fifth toenails Ulcer Exam: There is no evidence of ulcer or pre-ulcerative changes or infection. Orthopedic Exam: Muscle tone and strength are WNL. No limitations in general ROM. No crepitus or effusions noted. Foot type and digits show no abnormalities. Bony prominences are unremarkable. Skin: No Porokeratosis. No infection or ulcers  Diagnosis:  Onychomycosis, , Pain in right toe, pain in left toes  Treatment & Plan Procedures and Treatment: Consent by patient was obtained for treatment procedures. The patient understood the discussion of treatment and procedures well. All questions were answered thoroughly reviewed. Debridement of mycotic and hypertrophic toenails, 1 through 5 bilateral and clearing of subungual debris. No ulceration, no infection noted.  Return Visit-Office Procedure: Patient instructed to return to the office for a follow up visit 3 months for continued evaluation and treatment.

## 2015-10-06 ENCOUNTER — Telehealth: Payer: Self-pay | Admitting: *Deleted

## 2015-10-06 ENCOUNTER — Encounter: Payer: Self-pay | Admitting: *Deleted

## 2015-10-06 NOTE — Telephone Encounter (Signed)
Pt states she was told to call with her new medication.  Pt states Myrbetrig 25 mg daily for her prolapsed bladder.  I told pt I would inform Dr. Stacie Acres, and chart for pt's record.

## 2015-11-09 ENCOUNTER — Ambulatory Visit: Payer: Medicare Other | Admitting: Cardiovascular Disease

## 2015-11-11 ENCOUNTER — Ambulatory Visit (INDEPENDENT_AMBULATORY_CARE_PROVIDER_SITE_OTHER): Payer: Medicare Other | Admitting: Cardiovascular Disease

## 2015-11-11 ENCOUNTER — Encounter: Payer: Self-pay | Admitting: Cardiovascular Disease

## 2015-11-11 VITALS — BP 142/80 | HR 64 | Ht 62.0 in | Wt 214.0 lb

## 2015-11-11 DIAGNOSIS — E785 Hyperlipidemia, unspecified: Secondary | ICD-10-CM | POA: Diagnosis not present

## 2015-11-11 DIAGNOSIS — I2510 Atherosclerotic heart disease of native coronary artery without angina pectoris: Secondary | ICD-10-CM

## 2015-11-11 DIAGNOSIS — I10 Essential (primary) hypertension: Secondary | ICD-10-CM | POA: Diagnosis not present

## 2015-11-11 DIAGNOSIS — I2583 Coronary atherosclerosis due to lipid rich plaque: Principal | ICD-10-CM

## 2015-11-11 DIAGNOSIS — I519 Heart disease, unspecified: Secondary | ICD-10-CM

## 2015-11-11 NOTE — Assessment & Plan Note (Signed)
History of mild hyperlipidemia on statin therapy followed by her PCP

## 2015-11-11 NOTE — Patient Instructions (Signed)

## 2015-11-11 NOTE — Assessment & Plan Note (Signed)
History of hypertension blood pressure measured 142/80. She is on hydralazine, losartan and metoprolol. Continue current meds at current dosing

## 2015-11-11 NOTE — Progress Notes (Signed)
11/11/2015 [name]   07-08-1931  [id number]  Primary Physician Salena Saner., MD Primary Cardiologist: Lorretta Harp MD Renae Gloss   HPI:  The patient returns today for followup. She is a 81 year old moderately overweight widowed [place] American female, mother of 51, grandmother to 36 grandchildren, who is accompanied by one of her sister's today. I saw her 12 months ago and she saw Tenny Craw PA-C 04/22/15.Marland Kitchen She has a history of probable nonischemic cardiomyopathy with moderate LV dysfunction by 2D echocardiogram, last checked June 03, 2012, with an EF of 35% to 40%. She was catheterized by Dr. Ellouise Newer, June 06, 2012, revealing a similar EF with no significant CAD, but this was in the setting of sepsis, requiring intubation, and congestive heart failure, responding to antibiotics and diuresis. Her other problems include hypertension, hyperlipidemia, and diabetes.  She is otherwise asymptomatic. Since I saw her one year ago she's remained clinically stable. She has had some venous stasis ulcers on her legs followed by Dr. Steele Sizer at the [facility] wound care clinic. These ulcers have since healed and she wears compression stockings.   Current Outpatient Prescriptions  Medication Sig Dispense Refill  . ACCU-CHEK AVIVA PLUS test strip     . ACCU-CHEK SOFTCLIX LANCETS lancets     . acidophilus (RISAQUAD) CAPS Take 1 capsule by mouth daily.    Marland Kitchen aspirin 81 MG chewable tablet Chew 81 mg by mouth daily.     . benzonatate (TESSALON) 100 MG capsule     . Blood Glucose Monitoring Suppl (ACCU-CHEK AVIVA PLUS) W/DEVICE KIT     . calcium-vitamin D (OSCAL WITH D) 500-200 MG-UNIT per tablet Take 1 tablet by mouth daily.    . Coenzyme Q10 (CO Q-10) 200 MG CAPS Take 200 mg by mouth daily.    Marland Kitchen FeFum-FePoly-FA-B Cmp-C-Biot (INTEGRA PLUS) CAPS daily.     . furosemide (LASIX) 40 MG tablet Take 1 tablet (40 mg total) by mouth daily. 90 tablet 1  .  guaiFENesin-dextromethorphan (ROBITUSSIN DM) 100-10 MG/5ML syrup Take 5 mLs by mouth every 4 (four) hours as needed for cough. 118 mL 0  . hydrALAZINE (APRESOLINE) 50 MG tablet Take 1 tablet (50 mg total) by mouth 2 (two) times daily. 180 tablet 1  . losartan (COZAAR) 100 MG tablet TAKE 1 TABLET (100 MG TOTAL) BY MOUTH DAILY. 90 tablet 1  . metFORMIN (GLUCOPHAGE) 500 MG tablet Take 500 mg by mouth 2 (two) times daily with a meal.    . metoprolol (LOPRESSOR) 100 MG tablet Take 0.5 tablets (50 mg total) by mouth 2 (two) times daily. 90 tablet 1  . Mirabegron (MYRBETRIQ PO) Take by mouth.    . Multiple Vitamin (MULTI-VITAMIN PO) Take 1 tablet by mouth daily.    Marland Kitchen MYRBETRIQ 25 MG TB24 tablet TAKE 1 TABLET(S) EVERY DAY BY ORAL ROUTE.  5  . nitroGLYCERIN (NITROSTAT) 0.4 MG SL tablet Place 1 tablet (0.4 mg total) under the tongue every 5 (five) minutes as needed for chest pain. 25 tablet 6  . omega-3 acid ethyl esters (LOVAZA) 1 G capsule Take 2 g by mouth daily.    Marland Kitchen omeprazole (PRILOSEC) 40 MG capsule Take 40 mg by mouth 2 (two) times daily.     . potassium chloride SA (KLOR-CON M20) 20 MEQ tablet Take 1 tablet (20 mEq total) by mouth daily. 90 tablet 1  . simvastatin (ZOCOR) 20 MG tablet Take 20 mg by mouth every evening.    Marland Kitchen  vitamin C (ASCORBIC ACID) 500 MG tablet Take 500 mg by mouth daily.    . Zinc 50 MG TABS Take 1 tablet by mouth daily.     No current facility-administered medications for this visit.   Facility-Administered Medications Ordered in Other Visits  Medication Dose Route Frequency Provider Last Rate Last Dose  . ceFAZolin (ANCEF) IVPB 1 g/50 mL premix  1 g Intravenous 60 min Pre-Op Loel Lofty Dillingham, DO      . HYDROmorphone (DILAUDID) injection 0.25-0.5 mg  0.25-0.5 mg Intravenous Q5 min PRN Albertha Ghee, MD        Allergies  Allergen Reactions  . Lisinopril Other (See Comments)    cough    Social History   Social History  . Marital Status: Widowed    Spouse Name:  N/A  . Number of Children: 6  . Years of Education: N/A   Occupational History  . Not on file.   Social History Main Topics  . Smoking status: Never Smoker   . Smokeless tobacco: Never Used  . Alcohol Use: No     Comment: 11/30/11 "used to drink; not much; don't drink anymore"  . Drug Use: No  . Sexual Activity: No   Other Topics Concern  . Not on file   Social History Narrative     Review of Systems: General: negative for chills, fever, night sweats or weight changes.  Cardiovascular: negative for chest pain, dyspnea on exertion, edema, orthopnea, palpitations, paroxysmal nocturnal dyspnea or shortness of breath Dermatological: negative for rash Respiratory: negative for cough or wheezing Urologic: negative for hematuria Abdominal: negative for nausea, vomiting, diarrhea, bright red blood per rectum, melena, or hematemesis Neurologic: negative for visual changes, syncope, or dizziness All other systems reviewed and are otherwise negative except as noted above.    Blood pressure 142/80, pulse 64, height '5\' 2"'  (1.575 m), weight 214 lb (97.07 kg).  General appearance: alert and no distress Neck: no adenopathy, no carotid bruit, no JVD, supple, symmetrical, trachea midline and thyroid not enlarged, symmetric, no tenderness/mass/nodules Lungs: clear to auscultation bilaterally Heart: regular rate and rhythm, S1, S2 normal, no murmur, click, rub or gallop Extremities: extremities normal, atraumatic, no cyanosis or edema  EKG normal sinus rhythm at 64 with septal Q waves and nonspecific ST and T-wave changes. I personally reviewed this EKG  ASSESSMENT AND PLAN:   Hyperlipidemia History of mild hyperlipidemia on statin therapy followed by her PCP  HTN (hypertension) History of hypertension blood pressure measured 142/80. She is on hydralazine, losartan and metoprolol. Continue current meds at current dosing  CAD (coronary artery disease), mild, 20% cath 06/06/12 History of  noncritical CAD by cath performed by Dr. Ellouise Newer 06/06/12.  Diastolic dysfunction, left ventricle, grade 2 by echo 06/04/12 History of diastolic heart failure with an EF of 55-60% by 2-D echo performed 11/09/14 with grade 1 diastolic dysfunction. He denies chest pain or shortness of breath.      Lorretta Harp MD FACP,FACC,FAHA, [facility] 11/11/2015 9:50 AM

## 2015-11-11 NOTE — Assessment & Plan Note (Signed)
History of diastolic heart failure with an EF of 55-60% by 2-D echo performed 11/09/14 with grade 1 diastolic dysfunction. He denies chest pain or shortness of breath.

## 2015-11-11 NOTE — Assessment & Plan Note (Signed)
History of noncritical CAD by cath performed by Dr. Daphene Jaeger 06/06/12.

## 2015-12-01 ENCOUNTER — Other Ambulatory Visit: Payer: Self-pay | Admitting: Cardiovascular Disease

## 2015-12-02 NOTE — Telephone Encounter (Signed)
Rx request sent to pharmacy.  

## 2015-12-28 ENCOUNTER — Encounter: Payer: Self-pay | Admitting: Podiatry

## 2015-12-28 ENCOUNTER — Ambulatory Visit (INDEPENDENT_AMBULATORY_CARE_PROVIDER_SITE_OTHER): Payer: Medicare Other | Admitting: Podiatry

## 2015-12-28 DIAGNOSIS — E1142 Type 2 diabetes mellitus with diabetic polyneuropathy: Secondary | ICD-10-CM

## 2015-12-28 DIAGNOSIS — M79673 Pain in unspecified foot: Secondary | ICD-10-CM

## 2015-12-28 DIAGNOSIS — M2010 Hallux valgus (acquired), unspecified foot: Secondary | ICD-10-CM | POA: Diagnosis not present

## 2015-12-28 DIAGNOSIS — B351 Tinea unguium: Secondary | ICD-10-CM

## 2015-12-28 NOTE — Progress Notes (Signed)
Patient ID: [name], female   DOB: 11-03-1931, 82 y.o.   MRN: [id number] Complaint:  Visit Type: Patient returns to my office for continued preventative foot care services. Complaint: Patient states" my nails have grown long and thick and become painful to walk and wear shoes" Patient has been diagnosed with DM with neuropathy.. The patient presents for preventative foot care services. No changes to ROS  Podiatric Exam: Vascular: dorsalis pedis and posterior tibial pulses are palpable bilateral. Capillary return is immediate. Temperature gradient is WNL. Skin turgor WNL  Sensorium: Absent Semmes Weinstein monofilament test. Normal tactile sensation. Nail Exam: Pt has thick disfigured discolored nails with subungual debris noted bilateral entire nail hallux through fifth toenails Ulcer Exam: There is no evidence of ulcer or pre-ulcerative changes or infection. Orthopedic Exam: Muscle tone and strength are WNL. No limitations in general ROM. No crepitus or effusions noted. Foot type and digits show no abnormalities. Bony prominences are unremarkable. HAV B/L. Skin: No Porokeratosis. No infection or ulcers  Diagnosis:  Onychomycosis, , Pain in right toe, pain in left toes  Treatment & Plan Procedures and Treatment: Consent by patient was obtained for treatment procedures. The patient understood the discussion of treatment and procedures well. All questions were answered thoroughly reviewed. Debridement of mycotic and hypertrophic toenails, 1 through 5 bilateral and clearing of subungual debris. No ulceration, no infection noted. Initiate diabetic shoe paperwork with diabetic neuropathy,  HAV B/L and leg ulcers. Return Visit-Office Procedure: Patient instructed to return to the office for a follow up visit 3 months for continued evaluation and treatment.   Helane Gunther DPM

## 2016-01-23 ENCOUNTER — Telehealth: Payer: Self-pay | Admitting: *Deleted

## 2016-01-23 NOTE — Telephone Encounter (Signed)
Called patient in regards to notice from [facility] that Dr. Renae Gloss is stating she is no longer their patient.  Patient states she is but she is getting her shoes from somewhere else.  I told patient that I will complete the cancellation of this order today with her statement she is getting them from somewhere else.

## 2016-01-31 ENCOUNTER — Other Ambulatory Visit: Payer: Self-pay | Admitting: Cardiovascular Disease

## 2016-02-01 NOTE — Telephone Encounter (Signed)
Rx(s) sent to pharmacy electronically.  

## 2016-03-21 ENCOUNTER — Ambulatory Visit: Payer: Medicare Other | Admitting: Podiatry

## 2016-08-10 ENCOUNTER — Other Ambulatory Visit: Payer: Self-pay | Admitting: Internal Medicine

## 2016-08-10 DIAGNOSIS — Z1231 Encounter for screening mammogram for malignant neoplasm of breast: Secondary | ICD-10-CM

## 2016-08-17 ENCOUNTER — Other Ambulatory Visit: Payer: Self-pay

## 2016-08-17 MED ORDER — NITROGLYCERIN 0.4 MG SL SUBL: 0.4000 mg | SUBLINGUAL_TABLET | SUBLINGUAL | 6 refills | Status: DC | PRN

## 2016-09-10 ENCOUNTER — Ambulatory Visit: Payer: Medicare Other

## 2016-10-29 ENCOUNTER — Encounter: Payer: Self-pay | Admitting: *Deleted

## 2016-10-31 ENCOUNTER — Encounter: Payer: Self-pay | Admitting: Cardiovascular Disease

## 2016-10-31 ENCOUNTER — Ambulatory Visit (INDEPENDENT_AMBULATORY_CARE_PROVIDER_SITE_OTHER): Payer: Medicare Other | Admitting: Cardiovascular Disease

## 2016-10-31 VITALS — BP 122/50 | HR 40 | Ht 62.0 in | Wt 226.0 lb

## 2016-10-31 DIAGNOSIS — I10 Essential (primary) hypertension: Secondary | ICD-10-CM | POA: Diagnosis not present

## 2016-10-31 MED ORDER — METOPROLOL TARTRATE 25 MG PO TABS: 25.0000 mg | ORAL_TABLET | Freq: Every day | ORAL | 6 refills | Status: DC

## 2016-10-31 MED ORDER — FUROSEMIDE 20 MG PO TABS: 20.0000 mg | ORAL_TABLET | Freq: Every day | ORAL | 6 refills | Status: DC

## 2016-10-31 NOTE — Assessment & Plan Note (Signed)
History of hypertension blood pressure measured today at 122/50. She is on losartan and metoprolol. I'm going to cut her metoprolol from 50 mg twice a day to 25 mg by mouth twice a day because of bradycardia.

## 2016-10-31 NOTE — Progress Notes (Signed)
10/31/2016 [name]   10/09/1931  [id number]  Primary Physician Salena Saner., MD Primary Cardiologist: Lorretta Harp MD Lupe Carney, [place]  HPI:  The patient returns today for followup. She is a 82 year old moderately overweight widowed [place] American female, mother of 30, grandmother to 19 grandchildren, who is accompanied by one of her sister's today. I saw her in the office 11/11/15.Marland Kitchen She has a history of probable nonischemic cardiomyopathy with moderate LV dysfunction by 2D echocardiogram, last checked June 03, 2012, with an EF of 35% to 40%. She was catheterized by Dr. Ellouise Newer, June 06, 2012, revealing a similar EF with no significant CAD, but this was in the setting of sepsis, requiring intubation, and congestive heart failure, responding to antibiotics and diuresis. Her other problems include hypertension, hyperlipidemia, and diabetes.  She is otherwise asymptomatic. Since I saw her one year ago she's remained clinically stable. She has had some venous stasis ulcers on her legs followed by Dr. Steele Sizer at the [facility] wound care clinic. These ulcers have since healed and she wears compression stockings. She had a bladder tack operation for prolapsed bladder at [facility] on October 12 of this year. She is recuperating from this.   Current Outpatient Prescriptions  Medication Sig Dispense Refill  . ACCU-CHEK AVIVA PLUS test strip     . ACCU-CHEK SOFTCLIX LANCETS lancets     . acidophilus (RISAQUAD) CAPS Take 1 capsule by mouth daily.    Marland Kitchen aspirin 81 MG chewable tablet Chew 81 mg by mouth daily.     . benzonatate (TESSALON) 100 MG capsule     . Blood Glucose Monitoring Suppl (ACCU-CHEK AVIVA PLUS) W/DEVICE KIT     . calcium-vitamin D (OSCAL WITH D) 500-200 MG-UNIT per tablet Take 1 tablet by mouth daily.    . Coenzyme Q10 (CO Q-10) 200 MG CAPS Take 200 mg by mouth daily.    Marland Kitchen FeFum-FePoly-FA-B Cmp-C-Biot (INTEGRA PLUS)  CAPS daily.     . furosemide (LASIX) 20 MG tablet Take 1 tablet (20 mg total) by mouth daily. 30 tablet 6  . guaiFENesin-dextromethorphan (ROBITUSSIN DM) 100-10 MG/5ML syrup Take 5 mLs by mouth every 4 (four) hours as needed for cough. 118 mL 0  . hydrALAZINE (APRESOLINE) 50 MG tablet Take 1 tablet (50 mg total) by mouth 2 (two) times daily. 180 tablet 1  . losartan (COZAAR) 100 MG tablet TAKE 1 TABLET BY MOUTH ONCE DAILY 90 tablet 3  . metFORMIN (GLUCOPHAGE) 500 MG tablet Take 500 mg by mouth 2 (two) times daily with a meal.    . metoprolol (LOPRESSOR) 25 MG tablet Take 1 tablet (25 mg total) by mouth daily. 30 tablet 6  . Mirabegron (MYRBETRIQ PO) Take by mouth.    . Multiple Vitamin (MULTI-VITAMIN PO) Take 1 tablet by mouth daily.    Marland Kitchen MYRBETRIQ 25 MG TB24 tablet TAKE 1 TABLET(S) EVERY DAY BY ORAL ROUTE.  5  . nitroGLYCERIN (NITROSTAT) 0.4 MG SL tablet Place 1 tablet (0.4 mg total) under the tongue every 5 (five) minutes as needed for chest pain. 25 tablet 6  . omega-3 acid ethyl esters (LOVAZA) 1 G capsule Take 2 g by mouth daily.    Marland Kitchen omeprazole (PRILOSEC) 40 MG capsule Take 40 mg by mouth 2 (two) times daily.     . potassium chloride SA (K-DUR,KLOR-CON) 20 MEQ tablet TAKE 1 TABLET BY MOUTH ONCE DAILY 90 tablet 3  . simvastatin (ZOCOR) 20  MG tablet Take 20 mg by mouth every evening.    . vitamin C (ASCORBIC ACID) 500 MG tablet Take 500 mg by mouth daily.    . Zinc 50 MG TABS Take 1 tablet by mouth daily.     No current facility-administered medications for this visit.    Facility-Administered Medications Ordered in Other Visits  Medication Dose Route Frequency Provider Last Rate Last Dose  . ceFAZolin (ANCEF) IVPB 1 g/50 mL premix  1 g Intravenous 60 min Pre-Op Loel Lofty Dillingham, DO      . HYDROmorphone (DILAUDID) injection 0.25-0.5 mg  0.25-0.5 mg Intravenous Q5 min PRN Albertha Ghee, MD        Allergies  Allergen Reactions  . Lisinopril Other (See Comments)    cough    Social  History   Social History  . Marital status: Widowed    Spouse name: N/A  . Number of children: 6  . Years of education: N/A   Occupational History  . Not on file.   Social History Main Topics  . Smoking status: Never Smoker  . Smokeless tobacco: Never Used  . Alcohol use No     Comment: 11/30/11 "used to drink; not much; don't drink anymore"  . Drug use: No  . Sexual activity: No   Other Topics Concern  . Not on file   Social History Narrative  . No narrative on file     Review of Systems: General: negative for chills, fever, night sweats or weight changes.  Cardiovascular: negative for chest pain, dyspnea on exertion, edema, orthopnea, palpitations, paroxysmal nocturnal dyspnea or shortness of breath Dermatological: negative for rash Respiratory: negative for cough or wheezing Urologic: negative for hematuria Abdominal: negative for nausea, vomiting, diarrhea, bright red blood per rectum, melena, or hematemesis Neurologic: negative for visual changes, syncope, or dizziness All other systems reviewed and are otherwise negative except as noted above.    Blood pressure (!) 122/50, pulse (!) 40, height '5\' 2"'  (1.575 m), weight 226 lb (102.5 kg).  General appearance: alert and no distress Neck: no adenopathy, no carotid bruit, no JVD, supple, symmetrical, trachea midline and thyroid not enlarged, symmetric, no tenderness/mass/nodules Lungs: clear to auscultation bilaterally Heart: regular rate and rhythm, S1, S2 normal, no murmur, click, rub or gallop Extremities: extremities normal, atraumatic, no cyanosis or edema  EKG sinus bradycardia 40 with septal Q waves and nonspecific ST and T-wave changes. I personally reviewed this EKG  ASSESSMENT AND PLAN:   HTN (hypertension) History of hypertension blood pressure measured today at 122/50. She is on losartan and metoprolol. I'm going to cut her metoprolol from 50 mg twice a day to 25 mg by mouth twice a day because of  bradycardia.  Hyperlipidemia History of hyperlipidemia on Lovasa  and simvastatin followed by her PCP      Lorretta Harp MD [facility], [facility] 10/31/2016 10:07 AM

## 2016-10-31 NOTE — Patient Instructions (Signed)
Medication Instructions: Decrease Metoprolol to 25 mg daily.  Decrease Lasix to 20 mg daily.   Follow-Up: Your physician wants you to follow-up in: 1 year with Dr. Allyson Sabal. You will receive a reminder letter in the mail two months in advance. If you don't receive a letter, please call our office to schedule the follow-up appointment.  If you need a refill on your cardiac medications before your next appointment, please call your pharmacy.

## 2016-10-31 NOTE — Assessment & Plan Note (Signed)
History of hyperlipidemia on Lovasa  and simvastatin followed by her PCP

## 2016-11-07 ENCOUNTER — Telehealth: Payer: Self-pay | Admitting: Cardiovascular Disease

## 2016-11-07 MED ORDER — METOPROLOL TARTRATE 25 MG PO TABS: 25.0000 mg | ORAL_TABLET | Freq: Two times a day (BID) | ORAL | 11 refills | Status: DC

## 2016-11-07 NOTE — Telephone Encounter (Signed)
Per office note from 10/31/2016 with Dr Allyson Sabal "I'm going to cut her metoprolol from 50 mg twice a day to 25 mg by mouth twice a day because of bradycardia."  New, updated prescription sent to patient's pharmacy electronically.

## 2016-11-07 NOTE — Telephone Encounter (Signed)
Patient advised of correct dosage instructions.

## 2016-11-07 NOTE — Telephone Encounter (Signed)
New message  Pharm is calling to clarify dosage of Metoprolol  Pt formerly was taking 1/2 of 100mg  2X daily = 50mg   New Rx states 25mg  1 X daily  Please call back and advise

## 2016-11-08 ENCOUNTER — Telehealth: Payer: Self-pay | Admitting: *Deleted

## 2016-11-08 NOTE — Telephone Encounter (Signed)
[facility] left a msg on the refill vm requesting a quantity change on the metoprolol as the rx was only authorized for a two week supply.

## 2016-11-09 ENCOUNTER — Other Ambulatory Visit: Payer: Self-pay | Admitting: Cardiovascular Disease

## 2016-11-09 MED ORDER — METOPROLOL TARTRATE 25 MG PO TABS: 25.0000 mg | ORAL_TABLET | Freq: Two times a day (BID) | ORAL | 11 refills | Status: DC

## 2016-11-09 NOTE — Telephone Encounter (Signed)
Refill sent to the pharmacy electronically.  

## 2016-11-12 ENCOUNTER — Other Ambulatory Visit: Payer: Self-pay | Admitting: *Deleted

## 2016-11-12 MED ORDER — METOPROLOL TARTRATE 25 MG PO TABS: 25.0000 mg | ORAL_TABLET | Freq: Two times a day (BID) | ORAL | 11 refills | Status: DC

## 2016-12-04 ENCOUNTER — Ambulatory Visit (INDEPENDENT_AMBULATORY_CARE_PROVIDER_SITE_OTHER): Payer: Medicare Other | Admitting: Pharmacist

## 2016-12-04 VITALS — BP 138/66 | HR 51

## 2016-12-04 DIAGNOSIS — I10 Essential (primary) hypertension: Secondary | ICD-10-CM | POA: Diagnosis not present

## 2016-12-04 NOTE — Progress Notes (Signed)
Patient ID: [name]                 DOB: [birth year]                      MRN: [id number]     HPI: [name] is a 83 y.o. female referred by Dr. Gwenlyn Found to HTN clinic.  PMH relevant for CAD ,PVD, HTN, hyperlipidemia, HF with EF 35-40%, DM , and obesity.  Patient developed bradycardia with metoprolol 50 mg BID. Dose was decreased to metoprolol 29m BID by Cardiologist on 10/31/2016.  Presents to pharmacist clinic today 1/2/18with her daugther for follow up and therapy adjustment.    Patients denies dizziness, shortness or breath, swelling or any other problems. Still experiencing low energy but nothing "significant".  Current HTN meds: metoprolol tratrate 246mBID, losartan 10065mhydralazine 87m80mD, furosemide 20mg27mly every other day  Previously tried: metoprolol 87mg 105m lisinopril 5-40mg (46mh), HCTZ 12.5mg-65m35mBP goal: <140/80  Family History: mother - CHF; not significant from father  Social History: denies tobacco use and alcohol  Diet: less salt, salts substitute ([name])  Exercise: walk at home every day  Home BP readings: no records; reports ~140/60 with pulse 40s  Wt Readings from Last 3 Encounters:  10/31/16 226 lb (102.5 kg)  11/11/15 214 lb (97.1 kg)  04/22/15 217 lb 8 oz (98.7 kg)   BP Readings from Last 3 Encounters:  10/31/16 (!) 122/50  11/11/15 (!) 142/80  07/15/15 139/76   Pulse Readings from Last 3 Encounters:  10/31/16 (!) 40  11/11/15 64  07/15/15 64     Past Medical History:  Diagnosis Date  . Acute on chronic systolic and diastolic heart failure, NYHA class 2 (HCC) 7/1[place]013  . Anxiety   . Arthritis   . Cardiomyopathy, EF 35-40% 06/05/2012  . Diabetes mellitus   . Diastolic dysfunction, left ventricle, grade 2 by echo 06/04/12 06/05/2012  . Full dentures   . GERD (gastroesophageal reflux disease)   . H/O echocardiogram 06/03/2012   EF 35-40%; 26-71%ic function moderately reduced; grade 2 diastolic dysfunction;  mild AV regurg.; mod MV regurg.; LA severly dilated; mild-mod tricuspid valve regurg (acute care setting)  . History of nuclear stress test 06/29/2008   normal pattern of perfusion; negative for ischemia; low risk scan  . Hyperlipemia   . Hypertension   . Neuromuscular disorder (HCC)    numbness feet  . Peripheral vascular disease (HCC)   .Arplebitis 11/30/11   RLE  . Seizure, convulsive (HCC)   .Andersonin ulcer(s) 11/30/11   BLE; draining  . TR (tricuspid regurgitation), mod 06/05/2012  . Wears glasses     Current Outpatient Prescriptions on File Prior to Visit  Medication Sig Dispense Refill  . ACCU-CHEK AVIVA PLUS test strip     . ACCU-CHEK SOFTCLIX LANCETS lancets     . acidophilus (RISAQUAD) CAPS Take 1 capsule by mouth daily.    . aspiriMarland Kitchen 81 MG chewable tablet Chew 81 mg by mouth daily.     . benzonatate (TESSALON) 100 MG capsule     . Blood Glucose Monitoring Suppl (ACCU-CHEK AVIVA PLUS) W/DEVICE KIT     . calcium-vitamin D (OSCAL WITH D) 500-200 MG-UNIT per tablet Take 1 tablet by mouth daily.    . Coenzyme Q10 (CO Q-10) 200 MG CAPS Take 200 mg by mouth daily.    . FeFum-Marland KitchenePoly-FA-B Cmp-C-Biot (INTEGRA PLUS) CAPS daily.     .Marland Kitchen  furosemide (LASIX) 20 MG tablet Take 1 tablet (20 mg total) by mouth daily. 30 tablet 6  . guaiFENesin-dextromethorphan (ROBITUSSIN DM) 100-10 MG/5ML syrup Take 5 mLs by mouth every 4 (four) hours as needed for cough. 118 mL 0  . hydrALAZINE (APRESOLINE) 50 MG tablet Take 1 tablet (50 mg total) by mouth 2 (two) times daily. 180 tablet 1  . losartan (COZAAR) 100 MG tablet TAKE 1 TABLET BY MOUTH ONCE DAILY 90 tablet 3  . metFORMIN (GLUCOPHAGE) 500 MG tablet Take 500 mg by mouth 2 (two) times daily with a meal.    . metoprolol tartrate (LOPRESSOR) 25 MG tablet Take 1 tablet (25 mg total) by mouth 2 (two) times daily. 60 tablet 11  . Mirabegron (MYRBETRIQ PO) Take by mouth.    . Multiple Vitamin (MULTI-VITAMIN PO) Take 1 tablet by mouth daily.    Marland Kitchen MYRBETRIQ 25  MG TB24 tablet TAKE 1 TABLET(S) EVERY DAY BY ORAL ROUTE.  5  . nitroGLYCERIN (NITROSTAT) 0.4 MG SL tablet Place 1 tablet (0.4 mg total) under the tongue every 5 (five) minutes as needed for chest pain. 25 tablet 6  . omega-3 acid ethyl esters (LOVAZA) 1 G capsule Take 2 g by mouth daily.    Marland Kitchen omeprazole (PRILOSEC) 40 MG capsule Take 40 mg by mouth 2 (two) times daily.     . potassium chloride SA (K-DUR,KLOR-CON) 20 MEQ tablet TAKE 1 TABLET BY MOUTH ONCE DAILY 90 tablet 3  . simvastatin (ZOCOR) 20 MG tablet Take 20 mg by mouth every evening.    . vitamin C (ASCORBIC ACID) 500 MG tablet Take 500 mg by mouth daily.    . Zinc 50 MG TABS Take 1 tablet by mouth daily.     Current Facility-Administered Medications on File Prior to Visit  Medication Dose Route Frequency Provider Last Rate Last Dose  . ceFAZolin (ANCEF) IVPB 1 g/50 mL premix  1 g Intravenous 60 min Pre-Op Loel Lofty Dillingham, DO      . HYDROmorphone (DILAUDID) injection 0.25-0.5 mg  0.25-0.5 mg Intravenous Q5 min PRN Albertha Ghee, MD        Allergies  Allergen Reactions  . Lisinopril Other (See Comments)    cough    Assessment/Plan:  1. Hypertension/bradycardia - Heart rate improved from 40bpm at last office visit (10/21/16) to 51bpm today.  Patient experiencing lack of energy but no addiotional signs or symptoms noted.  Patient also reports  persistent HR <50s at home.   BP today of 138/66 is slightly increased from 122/50 at office visit on 10/21/16 ,but remians within acceptable range for patients age and disease state.  Will decrease metoprolol to 12.60m BID today (1/2 of 215mtablets) to improve HR.  Patient to keep  records of BP home readings and to bring home device to next office visit for evaluation.  Plan to adjust hydralazine during next visit if needed.  Prefer to keep metoprolol on board for now due to comorbidities and HF diagnoses but will re-assess discontinuation if HR not improved.   [name]  PharmD, [place] [id number]/01/2017 3:48 PM

## 2016-12-04 NOTE — Patient Instructions (Addendum)
Blood pressure today was 138/66 ; pulse 51  1. Decrease metoprolol dose to 12.5mg  twice daily (25 mg tablet - take 1/2 tablets)  2. Bring blood pressure Home Device  3. Keep record of home blood pressure and bring to next appointment  4. Follow up with hypertension clinic in 3 weeks     Bradycardia, Adult Bradycardia is a slower-than-normal heartbeat. A normal resting heart rate for an adult ranges from 60 to 100 beats per minute. With bradycardia, the resting heart rate is less than 60 beats per minute. Bradycardia can prevent enough oxygen from reaching certain areas of your body when you are active. It can be serious if it keeps enough oxygen from reaching your brain and other parts of your body. Bradycardia is not a problem for everyone. For some healthy adults, a slow resting heart rate is normal. What are the causes? This condition may be caused by:  A problem with the heart, including:  A problem with the heart's electrical system, such as a heart block.  A problem with the heart's natural pacemaker (sinus node).  Heart disease.  A heart attack.  Heart damage.  A heart infection.  A heart condition that is present at birth (congenital heart defect).  Certain medicines that treat heart conditions.  Certain conditions, such as hypothyroidism and obstructive sleep apnea.  Problems with the balance of chemicals and other substances, like potassium, in the blood. What increases the risk? This condition is more likely to develop in adults who:  Are age 73 or older.  Have high blood pressure (hypertension), high cholesterol (hyperlipidemia), or diabetes.  Drink heavily, use tobacco or nicotine products, or use drugs.  Are stressed. What are the signs or symptoms? Symptoms of this condition include:  Light-headedness.  Feeling faint or fainting.  Fatigue and weakness.  Shortness of breath.  Chest pain  (angina).  Drowsiness.  Confusion.  Dizziness. How is this diagnosed? This condition may be diagnosed based on:  Your symptoms.  Your medical history.  A physical exam. During the exam, your health care provider will listen to your heartbeat and check your pulse. To confirm the diagnosis, your health care provider may order tests, such as:  Blood tests.  An electrocardiogram (ECG). This test records the heart's electrical activity. The test can show how fast your heart is beating and whether the heartbeat is steady.  A test in which you wear a portable device (event recorder or Holter monitor) to record your heart's electrical activity while you go about your day.  Anexercise test. How is this treated? Treatment for this condition depends on the cause of the condition and how severe your symptoms are. Treatment may involve:  Treatment of the underlying condition.  Changing your medicines or how much medicine you take.  Having a small, battery-operated device called a pacemaker implanted under the skin. When bradycardia occurs, this device can be used to increase your heart rate and help your heart to beat in a regular rhythm. Follow these instructions at home: Lifestyle  Manage any health conditions that contribute to bradycardia as told by your health care provider.  Follow a heart-healthy diet. A nutrition specialist (dietitian) can help to educate you about healthy food options and changes.  Follow an exercise program that is approved by your health care provider.  Maintain a healthy weight.  Try to reduce or manage your stress, such as with yoga or meditation. If you need help reducing stress, ask your health care provider.  Do not use use any products that contain nicotine or tobacco, such as cigarettes and e-cigarettes. If you need help quitting, ask your health care provider.  Do not use illegal drugs.  Limit alcohol intake to no more than 1 drink per day for  nonpregnant women and 2 drinks per day for men. One drink equals 12 oz of beer, 5 oz of wine, or 1 oz of hard liquor. General instructions  Take over-the-counter and prescription medicines only as told by your health care provider.  Keep all follow-up visits as directed by your health care provider. This is important. How is this prevented? In some cases, bradycardia may be prevented by:  Treating underlying medical problems.  Stopping behaviors or medicines that can trigger the condition. Contact a health care provider if:  You feel light-headed or dizzy.  You almost faint.  You feel weak or are easily fatigued during physical activity.  You experience confusion or have memory problems. Get help right away if:  You faint.  You have an irregular heartbeat (palpitations).  You have chest pain.  You have trouble breathing. This information is not intended to replace advice given to you by your health care provider. Make sure you discuss any questions you have with your health care provider. Document Released: 08/11/2002 Document Revised: 07/17/2016 Document Reviewed: 05/10/2016 Elsevier Interactive Patient Education  2017 [employer].

## 2016-12-24 ENCOUNTER — Ambulatory Visit (INDEPENDENT_AMBULATORY_CARE_PROVIDER_SITE_OTHER): Payer: Medicare Other | Admitting: Pharmacist

## 2016-12-24 VITALS — BP 156/68 | HR 45

## 2016-12-24 DIAGNOSIS — I5042 Chronic combined systolic (congestive) and diastolic (congestive) heart failure: Secondary | ICD-10-CM

## 2016-12-24 DIAGNOSIS — I10 Essential (primary) hypertension: Secondary | ICD-10-CM

## 2016-12-24 MED ORDER — HYDRALAZINE HCL 50 MG PO TABS: 75.0000 mg | ORAL_TABLET | Freq: Two times a day (BID) | ORAL | 1 refills | Status: DC

## 2016-12-24 NOTE — Progress Notes (Signed)
Patient ID: [name]                 DOB: [birth year]                      MRN: [id number]     HPI: [name] is a 83 y.o. female referred by Dr. Gwenlyn [name] to HTN clinic.  PMH relevant for CAD ,PVD, HTN, hyperlipidemia, HF with EF 35-40%, DM , and obesity.  Patient developed bradycardia with metoprolol 50 mg BID. Dose was decreased to metoprolol 48m BID by Cardiologist on 10/31/2016 and further decreased to 12.514mon 12/04/2016 by pharmacist.  Presents to pharmacist clinic for follow up and therapy adjustment.    Patients denies dizziness, shortness or breath, or swelling. Feels tired when pulse around 42-43 but no episodes of syncope.   Current HTN meds: metoprolol tratrate 12.21m61mID, losartan 100m521mydralazine 50mg10m, furosemide 20mg 23my every other day  Previously tried: metoprolol 50mg B63mbradicardia), lisinopril 5-40mg (c32m), HCTZ 12.21mg-221mg56mP goal: <140/80  Family History: mother - CHF; not significant from father  Social History: denies tobacco use and alcohol  Diet: less salt, salts substitute (Mrs Dash or aDeliah Bostonurpose), 1 cup of coffee every morning  Exercise: walk at home every day  Home BP readings: 20 readings; 136/67 average (average pulse 49 bpm)  Wt Readings from Last 3 Encounters:  10/31/16 226 lb (102.5 kg)  11/11/15 214 lb (97.1 kg)  04/22/15 217 lb 8 oz (98.7 kg)   BP Readings from Last 3 Encounters:  12/04/16 138/66  10/31/16 (!) 122/50  11/11/15 (!) 142/80   Pulse Readings from Last 3 Encounters:  12/04/16 (!) 51  10/31/16 (!) 40  11/11/15 64     Past Medical History:  Diagnosis Date  . Acute on chronic systolic and diastolic heart failure, NYHA class 2 (HCC) 7/12[place]13  . Anxiety   . Arthritis   . Cardiomyopathy, EF 35-40% 06/05/2012  . Diabetes mellitus   . Diastolic dysfunction, left ventricle, grade 2 by echo 06/04/12 06/05/2012  . Full dentures   . GERD (gastroesophageal reflux disease)   . H/O echocardiogram 06/03/2012   EF  35-40%; s47-82%c function moderately reduced; grade 2 diastolic dysfunction; mild AV regurg.; mod MV regurg.; LA severly dilated; mild-mod tricuspid valve regurg (acute care setting)  . History of nuclear stress test 06/29/2008   normal pattern of perfusion; negative for ischemia; low risk scan  . Hyperlipemia   . Hypertension   . Neuromuscular disorder (HCC)    numbness feet  . Peripheral vascular disease (HCC)   . [place] 11/30/11   RLE  . Seizure, convulsive (HCC)   . [place] ulcer(s) 11/30/11   BLE; draining  . TR (tricuspid regurgitation), mod 06/05/2012  . Wears glasses     Current Outpatient Prescriptions on File Prior to Visit  Medication Sig Dispense Refill  . ACCU-CHEK AVIVA PLUS test strip     . ACCU-CHEK SOFTCLIX LANCETS lancets     . acidophilus (RISAQUAD) CAPS Take 1 capsule by mouth daily.    . aspirinMarland Kitchen81 MG chewable tablet Chew 81 mg by mouth daily.     . benzonatate (TESSALON) 100 MG capsule     . Blood Glucose Monitoring Suppl (ACCU-CHEK AVIVA PLUS) W/DEVICE KIT     . calcium-vitamin D (OSCAL WITH D) 500-200 MG-UNIT per tablet Take 1 tablet by mouth daily.    . Coenzyme Q10 (CO Q-10) 200 MG CAPS Take 200 mg by  mouth daily.    Marland Kitchen FeFum-FePoly-FA-B Cmp-C-Biot (INTEGRA PLUS) CAPS daily.     . furosemide (LASIX) 20 MG tablet Take 1 tablet (20 mg total) by mouth daily. 30 tablet 6  . guaiFENesin-dextromethorphan (ROBITUSSIN DM) 100-10 MG/5ML syrup Take 5 mLs by mouth every 4 (four) hours as needed for cough. 118 mL 0  . hydrALAZINE (APRESOLINE) 50 MG tablet Take 1 tablet (50 mg total) by mouth 2 (two) times daily. 180 tablet 1  . losartan (COZAAR) 100 MG tablet TAKE 1 TABLET BY MOUTH ONCE DAILY 90 tablet 3  . metFORMIN (GLUCOPHAGE) 500 MG tablet Take 500 mg by mouth 2 (two) times daily with a meal.    . metoprolol tartrate (LOPRESSOR) 25 MG tablet Take 1 tablet (25 mg total) by mouth 2 (two) times daily. 60 tablet 11  . Mirabegron (MYRBETRIQ PO) Take by mouth.    . Multiple  Vitamin (MULTI-VITAMIN PO) Take 1 tablet by mouth daily.    Marland Kitchen MYRBETRIQ 25 MG TB24 tablet TAKE 1 TABLET(S) EVERY DAY BY ORAL ROUTE.  5  . nitroGLYCERIN (NITROSTAT) 0.4 MG SL tablet Place 1 tablet (0.4 mg total) under the tongue every 5 (five) minutes as needed for chest pain. 25 tablet 6  . omega-3 acid ethyl esters (LOVAZA) 1 G capsule Take 2 g by mouth daily.    Marland Kitchen omeprazole (PRILOSEC) 40 MG capsule Take 40 mg by mouth 2 (two) times daily.     . potassium chloride SA (K-DUR,KLOR-CON) 20 MEQ tablet TAKE 1 TABLET BY MOUTH ONCE DAILY 90 tablet 3  . simvastatin (ZOCOR) 20 MG tablet Take 20 mg by mouth every evening.    . vitamin C (ASCORBIC ACID) 500 MG tablet Take 500 mg by mouth daily.    . Zinc 50 MG TABS Take 1 tablet by mouth daily.     Current Facility-Administered Medications on File Prior to Visit  Medication Dose Route Frequency Provider Last Rate Last Dose  . ceFAZolin (ANCEF) IVPB 1 g/50 mL premix  1 g Intravenous 60 min Pre-Op Loel Lofty Dillingham, DO      . HYDROmorphone (DILAUDID) injection 0.25-0.5 mg  0.25-0.5 mg Intravenous Q5 min PRN Albertha Ghee, MD        Allergies  Allergen Reactions  . Lisinopril Other (See Comments)    cough    Assessment/Plan:  1. Hypertension/bradycardia - Heart rate remains between 40-50s with few episodes of symptomatic bradycardia reported by patient.   BP today of 156/68 is elevated from previous reading and above goal of 140/80, but expected after beta-blocker dose decreased. Prefer to keep metoprolol on board due to comorbidities and HF diagnoses but HR remains low and patient continues to report symptoms with pulse in the low 40s.  Will hold metoprolol at this time, increase hydralazine to 61m BID and follow up in 2 weeks.  Patient instructed to call clinic if pulse not improving after 5-7 off metoprolol.   [name] PharmD, [id number]/22/2018 7:19  AM

## 2016-12-24 NOTE — Patient Instructions (Addendum)
Return for a  follow up appointment in 2 weeks  Your blood pressure today is 156/68 pulse 45  Check your blood pressure at home daily (if able) and keep record of the readings.  Take your BP meds as follows: **Stop taking metoprolol** **Increase hydralazine to 75mg  BID** Okay to take 1 and 1/2 tablet of 50mg  tablets** Continue losartan 100mg  Continue furosemide 20mg  daily every other day  Call clinic if HR remains below 50s after 1 week off metoprolol  Bring all of your meds, your BP cuff and your record of home blood pressures to your next appointment.  Exercise as you're able, try to walk approximately 30 minutes per day.  Keep salt intake to a minimum, especially watch canned and prepared boxed foods.  Eat more fresh fruits and vegetables and fewer canned items.  Avoid eating in fast food restaurants.     HOW TO TAKE YOUR BLOOD PRESSURE: . Rest 5 minutes before taking your blood pressure. .  Don't smoke or drink caffeinated beverages for at least 30 minutes before. . Take your blood pressure before (not after) you eat. . Sit comfortably with your back supported and both feet on the floor (don't cross your legs). . Elevate your arm to heart level on a table or a desk. . Use the proper sized cuff. It should fit smoothly and snugly around your bare upper arm. There should be enough room to slip a fingertip under the cuff. The bottom edge of the cuff should be 1 inch above the crease of the elbow. . Ideally, take 3 measurements at one sitting and record the average.

## 2017-01-02 ENCOUNTER — Telehealth: Payer: Self-pay | Admitting: Pharmacist

## 2017-01-02 NOTE — Telephone Encounter (Signed)
Talked to patient on the phone today to follow up with HR/pulse after metoprolol held on 12/24/16.  Patient doing "great".  BP this morning was 143 systolic with pulse 72.  No headaches or problems noted.  Plan to resume beta-blocker at lower dose during next office visit after BP and HR average assessment.

## 2017-01-07 ENCOUNTER — Ambulatory Visit (INDEPENDENT_AMBULATORY_CARE_PROVIDER_SITE_OTHER): Payer: Medicare Other | Admitting: Pharmacist

## 2017-01-07 VITALS — BP 158/80 | HR 61

## 2017-01-07 DIAGNOSIS — I10 Essential (primary) hypertension: Secondary | ICD-10-CM | POA: Diagnosis not present

## 2017-01-07 MED ORDER — HYDRALAZINE HCL 50 MG PO TABS: 75.0000 mg | ORAL_TABLET | Freq: Two times a day (BID) | ORAL | 1 refills | Status: AC

## 2017-01-07 MED ORDER — HYDRALAZINE HCL 50 MG PO TABS: 50.0000 mg | ORAL_TABLET | Freq: Two times a day (BID) | ORAL | 1 refills | Status: DC

## 2017-01-07 MED ORDER — METOPROLOL SUCCINATE ER 25 MG PO TB24: 12.5000 mg | ORAL_TABLET | Freq: Every day | ORAL | 0 refills | Status: DC

## 2017-01-07 NOTE — Progress Notes (Signed)
Patient ID: [name]                 DOB: 02/28/1931                      MRN: [id number]      HPI: [name] is a 83 y.o. female referred by Dr. Berry to HTN clinic.  PMH relevant for CAD, PVD, HTN, hyperlipidemia, HF with EF 35-40%, DM, and obesity.  Patient developed bradycardia with metoprolol 50 mg BID. Dose was decreased to metoprolol 25mg BID by Cardiologist on 10/31/2016 and further decreased to 12.5mg on 12/04/2016 by pharmacist but bradycardia didn't not resolved.  Metoprolol was held 2 weeks ago to re-assess bradycardia.   Presents to pharmacist clinic for follow up and therapy adjustment.   Patients denies dizziness, shortness of breath, or swelling. Feels more energetic since metoprolol was held and has no complaints to report today.   Current HTN meds: losartan 100mg, hydralazine 75mg BID, furosemide 20mg daily every other day  Previously tried: metoprolol 12.5-50mg BID (bradicardia), lisinopril 5-40mg (cough), HCTZ 12.5mg-25mg  BP goal: <140/80  Family History: mother - CHF; not significant from father  Social History: denies tobacco use and alcohol  Diet: less salt, salts substitute (Mrs Dash or all purpose), 1 cup of coffee every morning  Exercise: walk at home every day  Home BP readings: 14 readings; 122/73 average (average pulse 74 bpm)   Wt Readings from Last 3 Encounters:  10/31/16 226 lb (102.5 kg)  11/11/15 214 lb (97.1 kg)  04/22/15 217 lb 8 oz (98.7 kg)   BP Readings from Last 3 Encounters:  01/07/17 (!) 158/80  12/24/16 (!) 156/68  12/04/16 138/66   Pulse Readings from Last 3 Encounters:  01/07/17 61  12/24/16 (!) 45  12/04/16 (!) 51     Past Medical History:  Diagnosis Date  . Acute on chronic systolic and diastolic heart failure, NYHA class 2 (HCC) 06/13/2012  . Anxiety   . Arthritis   . Cardiomyopathy, EF 35-40% 06/05/2012  . Diabetes mellitus   . Diastolic dysfunction, left ventricle, grade 2 by echo 06/04/12 06/05/2012  . Full  dentures   . GERD (gastroesophageal reflux disease)   . H/O echocardiogram 06/03/2012   EF 35-40%; systolic function moderately reduced; grade 2 diastolic dysfunction; mild AV regurg.; mod MV regurg.; LA severly dilated; mild-mod tricuspid valve regurg (acute care setting)  . History of nuclear stress test 06/29/2008   normal pattern of perfusion; negative for ischemia; low risk scan  . Hyperlipemia   . Hypertension   . Neuromuscular disorder (HCC)    numbness feet  . Peripheral vascular disease (HCC)   . Phlebitis 11/30/11   RLE  . Seizure, convulsive (HCC)   . Skin ulcer(s) 11/30/11   BLE; draining  . TR (tricuspid regurgitation), mod 06/05/2012  . Wears glasses     Current Outpatient Prescriptions on File Prior to Visit  Medication Sig Dispense Refill  . ACCU-CHEK AVIVA PLUS test strip     . ACCU-CHEK SOFTCLIX LANCETS lancets     . acidophilus (RISAQUAD) CAPS Take 1 capsule by mouth daily.    . aspirin 81 MG chewable tablet Chew 81 mg by mouth daily.     . benzonatate (TESSALON) 100 MG capsule     . Blood Glucose Monitoring Suppl (ACCU-CHEK AVIVA PLUS) W/DEVICE KIT     . calcium-vitamin D (OSCAL WITH D) 500-200 MG-UNIT per tablet Take 1 tablet by mouth daily.    .   Coenzyme Q10 (CO Q-10) 200 MG CAPS Take 200 mg by mouth daily.    Marland Kitchen FeFum-FePoly-FA-B Cmp-C-Biot (INTEGRA PLUS) CAPS daily.     . furosemide (LASIX) 20 MG tablet Take 1 tablet (20 mg total) by mouth daily. 30 tablet 6  . guaiFENesin-dextromethorphan (ROBITUSSIN DM) 100-10 MG/5ML syrup Take 5 mLs by mouth every 4 (four) hours as needed for cough. 118 mL 0  . losartan (COZAAR) 100 MG tablet TAKE 1 TABLET BY MOUTH ONCE DAILY 90 tablet 3  . metFORMIN (GLUCOPHAGE) 500 MG tablet Take 500 mg by mouth 2 (two) times daily with a meal.    . Mirabegron (MYRBETRIQ PO) Take by mouth.    . Multiple Vitamin (MULTI-VITAMIN PO) Take 1 tablet by mouth daily.    Marland Kitchen MYRBETRIQ 25 MG TB24 tablet TAKE 1 TABLET(S) EVERY DAY BY ORAL ROUTE.  5  .  nitroGLYCERIN (NITROSTAT) 0.4 MG SL tablet Place 1 tablet (0.4 mg total) under the tongue every 5 (five) minutes as needed for chest pain. 25 tablet 6  . omega-3 acid ethyl esters (LOVAZA) 1 G capsule Take 2 g by mouth daily.    Marland Kitchen omeprazole (PRILOSEC) 40 MG capsule Take 40 mg by mouth 2 (two) times daily.     . potassium chloride SA (K-DUR,KLOR-CON) 20 MEQ tablet TAKE 1 TABLET BY MOUTH ONCE DAILY 90 tablet 3  . simvastatin (ZOCOR) 20 MG tablet Take 20 mg by mouth every evening.    . vitamin C (ASCORBIC ACID) 500 MG tablet Take 500 mg by mouth daily.    . Zinc 50 MG TABS Take 1 tablet by mouth daily.     Current Facility-Administered Medications on File Prior to Visit  Medication Dose Route Frequency Provider Last Rate Last Dose  . ceFAZolin (ANCEF) IVPB 1 g/50 mL premix  1 g Intravenous 60 min Pre-Op Loel Lofty Dillingham, DO      . HYDROmorphone (DILAUDID) injection 0.25-0.5 mg  0.25-0.5 mg Intravenous Q5 min PRN Albertha Ghee, MD        Allergies  Allergen Reactions  . Lisinopril Other (See Comments)    cough    Blood pressure (!) 158/80, pulse 61, SpO2 98 %.   Essential Hypertension/Bradycardia:  BP today remains  elevated above goal of <140/90 but home records show an average BP of 122/73 will ALL readings taken 1 hour after morning medication.  Bradycardia resolved after metoprolol was help for 2 weeks and patient feeling more energetic (less tired).  Will re-challenge with beta-blocker due to HF comorbidity, but will use Metoprolol succinate 12.29m daily instead.  Also plan to continue Hydralazine 742mBID but move doses to mid-day and evening instead of morning and bedtime to maintain good BP control throughout the day. Patient will continue daily BP and HR monitoring at home.  Hypertension clinic will contact patient in 1 week to re-assess BP and HR home records and schedule follow up as needed after tolerability to metoprolol succinate evaluated.  [name] PharmD,  [id number]715176/04/2017 2:09 PM

## 2017-01-07 NOTE — Patient Instructions (Addendum)
Return for a  follow up appointment in as needed  Your blood pressure today is 158/80 pulse 61  Check your blood pressure at home daily (if able) and keep record of the readings.  Take your BP meds as follows: losartan 100 mg daily in the morning hydralazine 75 mg twice daily (mid-day and bedtime) furosemide 20mg  daily every other day **Metoprolol succinate 12.5mg  every morning**  Bring all of your meds, your BP cuff and your record of home blood pressures to your next appointment.  Exercise as you're able, try to walk approximately 30 minutes per day.  Keep salt intake to a minimum, especially watch canned and prepared boxed foods.  Eat more fresh fruits and vegetables and fewer canned items.  Avoid eating in fast food restaurants.    HOW TO TAKE YOUR BLOOD PRESSURE: . Rest 5 minutes before taking your blood pressure. .  Don't smoke or drink caffeinated beverages for at least 30 minutes before. . Take your blood pressure before (not after) you eat. . Sit comfortably with your back supported and both feet on the floor (don't cross your legs). . Elevate your arm to heart level on a table or a desk. . Use the proper sized cuff. It should fit smoothly and snugly around your bare upper arm. There should be enough room to slip a fingertip under the cuff. The bottom edge of the cuff should be 1 inch above the crease of the elbow. . Ideally, take 3 measurements at one sitting and record the average.

## 2017-01-08 ENCOUNTER — Ambulatory Visit: Payer: Medicare Other

## 2017-01-17 ENCOUNTER — Telehealth: Payer: Self-pay | Admitting: Pharmacist

## 2017-01-17 MED ORDER — METOPROLOL SUCCINATE ER 25 MG PO TB24: 12.5000 mg | ORAL_TABLET | Freq: Every day | ORAL | 11 refills | Status: AC

## 2017-01-17 NOTE — Telephone Encounter (Signed)
Patient tolerating metoprolol succinate 12.5mg  daily  BP reading (7 readings) ; 136/65 average Pulse 59-67 range (62bmp average)   Will continue current regimen without changes . Patient encouraged to contact HTN clinic if pulse <50 , dizziness or low BP

## 2017-02-03 ENCOUNTER — Other Ambulatory Visit: Payer: Self-pay | Admitting: Cardiovascular Disease

## 2017-04-04 ENCOUNTER — Other Ambulatory Visit: Payer: Self-pay | Admitting: Cardiovascular Disease

## 2017-04-05 ENCOUNTER — Telehealth: Payer: Self-pay | Admitting: Cardiovascular Disease

## 2017-04-05 NOTE — Telephone Encounter (Signed)
She needs a discontinue order for pt's Potassium medicine.Pt said Dr Allyson Sabal had stopped her Potassium medicine. Please fax to [phone]

## 2017-04-05 NOTE — Telephone Encounter (Signed)
Left message for wellness center to call

## 2017-04-05 NOTE — Telephone Encounter (Signed)
Notified  Cornisha that we do not have an order to stop K+

## 2017-04-08 ENCOUNTER — Telehealth: Payer: Self-pay | Admitting: Pharmacist

## 2017-04-08 NOTE — Telephone Encounter (Signed)
Patient called to verify her medication list/instructions.   She has losartan 100mg  at home and also received Rx for valsartan HCT 160/12.5 from PCP   Patient stated feeling "goog" with pulse 69bpm this morning and systolic BP 114 Our list current medication list show:  Losartan 100mg  daily Hydralazine 75mg  twice daily Metoprolol succinate 12.5mg  daily Furosemide 20mg  every other day (takes daily if needed)  Patient stated she is taking ALL medication as prescribed. Confused about valsartan order from PCP.   **Instructed to keep taking losartan 100mg  and HOLD on valsartan until clarify with PCP** **Okay to change to valsartan if needed** ** DO NOT take losartan and valsartan **

## 2017-05-10 ENCOUNTER — Encounter: Payer: Self-pay | Admitting: Podiatry

## 2017-05-10 ENCOUNTER — Ambulatory Visit (INDEPENDENT_AMBULATORY_CARE_PROVIDER_SITE_OTHER): Payer: Medicare Other | Admitting: Podiatry

## 2017-05-10 DIAGNOSIS — B351 Tinea unguium: Secondary | ICD-10-CM

## 2017-05-10 DIAGNOSIS — E1142 Type 2 diabetes mellitus with diabetic polyneuropathy: Secondary | ICD-10-CM | POA: Diagnosis not present

## 2017-05-10 DIAGNOSIS — M2010 Hallux valgus (acquired), unspecified foot: Secondary | ICD-10-CM

## 2017-05-10 NOTE — Progress Notes (Signed)
Patient ID: [name], female   DOB: [birth year], 83 y.o.   MRN: [id number] Complaint:  Visit Type: Patient returns to my office for continued preventative foot care services. Complaint: Patient states" my nails have grown long and thick and become painful to walk and wear shoes" Patient has been diagnosed with DM with neuropathy.. The patient presents for preventative foot care services. No changes to ROS.  She goves history of hospitalization which has kept her from returning to this office. Podiatric Exam: Vascular: dorsalis pedis and posterior tibial pulses are palpable bilateral. Capillary return is immediate. Temperature gradient is WNL. Skin turgor WNL  Sensorium: Absent Semmes Weinstein monofilament test. Normal tactile sensation. Nail Exam: Pt has thick disfigured discolored nails with subungual debris noted bilateral entire nail hallux through fifth toenails Ulcer Exam: There is no evidence of ulcer or pre-ulcerative changes or infection. Orthopedic Exam: Muscle tone and strength are WNL. No limitations in general ROM. No crepitus or effusions noted. Foot type and digits show no abnormalities. Bony prominences are unremarkable. HAV B/L. Skin: No Porokeratosis. No infection or ulcers  Diagnosis:  Onychomycosis, , Pain in right toe, pain in left toes  Treatment & Plan Procedures and Treatment: Consent by patient was obtained for treatment procedures. The patient understood the discussion of treatment and procedures well. All questions were answered thoroughly reviewed. Debridement of mycotic and hypertrophic toenails, 1 through 5 bilateral and clearing of subungual debris. No ulceration, no infection noted. Initiate diabetic shoe paperwork with diabetic neuropathy,  HAV B/L .  Leg ulcers are healed. Return Visit-Office Procedure: Patient instructed to return to the office for a follow up visit 3 months for continued evaluation and treatment.We will acquire diabetic shoes for this patient  next visit.  Told her to make an appointment any day except Friday.   Helane Gunther DPM

## 2017-07-26 ENCOUNTER — Other Ambulatory Visit: Payer: Self-pay | Admitting: Cardiovascular Disease

## 2017-07-29 ENCOUNTER — Other Ambulatory Visit: Payer: Self-pay

## 2017-07-29 MED ORDER — NITROGLYCERIN 0.4 MG SL SUBL: 0.4000 mg | SUBLINGUAL_TABLET | SUBLINGUAL | 2 refills | Status: DC | PRN

## 2017-07-29 NOTE — Telephone Encounter (Signed)
REFILL 

## 2017-08-06 ENCOUNTER — Ambulatory Visit: Payer: Medicare Other | Admitting: Orthotics

## 2017-08-06 DIAGNOSIS — M2010 Hallux valgus (acquired), unspecified foot: Secondary | ICD-10-CM

## 2017-08-06 DIAGNOSIS — E1142 Type 2 diabetes mellitus with diabetic polyneuropathy: Secondary | ICD-10-CM

## 2017-08-06 NOTE — Progress Notes (Signed)
Patient came in today for fitting and eval for diabetic shoes: Patient' doctor here is Dr. Tomi Bamberger and PCP is Renae Gloss  Patient presents with DM2, HAV b/l Patient was measured with brannock device and cast in foam for custom inserts.  Patient chose [employer] [phone]

## 2017-08-07 ENCOUNTER — Ambulatory Visit (INDEPENDENT_AMBULATORY_CARE_PROVIDER_SITE_OTHER): Payer: Medicare Other | Admitting: Podiatry

## 2017-08-07 ENCOUNTER — Encounter: Payer: Self-pay | Admitting: Podiatry

## 2017-08-07 DIAGNOSIS — E1142 Type 2 diabetes mellitus with diabetic polyneuropathy: Secondary | ICD-10-CM | POA: Diagnosis not present

## 2017-08-07 DIAGNOSIS — M79675 Pain in left toe(s): Secondary | ICD-10-CM | POA: Diagnosis not present

## 2017-08-07 DIAGNOSIS — M2010 Hallux valgus (acquired), unspecified foot: Secondary | ICD-10-CM

## 2017-08-07 DIAGNOSIS — M79674 Pain in right toe(s): Secondary | ICD-10-CM

## 2017-08-07 DIAGNOSIS — B351 Tinea unguium: Secondary | ICD-10-CM | POA: Diagnosis not present

## 2017-08-07 NOTE — Progress Notes (Signed)
Patient ID: [name], female   DOB: March 29, 1931, 83 y.o.   MRN: [id number] Complaint:  Visit Type: Patient returns to my office for continued preventative foot care services. Complaint: Patient states" my nails have grown long and thick and become painful to walk and wear shoes" Patient has been diagnosed with DM with neuropathy.. The patient presents for preventative foot care services. No changes to ROS.  Patient says she was measured for her diabetic shoes  Yesterday. Podiatric Exam: Vascular: dorsalis pedis and posterior tibial pulses are palpable bilateral. Capillary return is immediate. Temperature gradient is WNL. Skin turgor WNL  Sensorium: Absent Semmes Weinstein monofilament test. Normal tactile sensation. Nail Exam: Pt has thick disfigured discolored nails with subungual debris noted bilateral entire nail hallux through fifth toenails Ulcer Exam: There is no evidence of ulcer or pre-ulcerative changes or infection. Orthopedic Exam: Muscle tone and strength are WNL. No limitations in general ROM. No crepitus or effusions noted. Foot type and digits show no abnormalities. Bony prominences are unremarkable. HAV B/L. Skin: No Porokeratosis. No infection or ulcers  Diagnosis:  Onychomycosis, , Pain in right toe, pain in left toes  Treatment & Plan Procedures and Treatment: Consent by patient was obtained for treatment procedures. The patient understood the discussion of treatment and procedures well. All questions were answered thoroughly reviewed. Debridement of mycotic and hypertrophic toenails, 1 through 5 bilateral and clearing of subungual debris. No ulceration, no infection noted.   HAV B/L and leg ulcers. Return Visit-Office Procedure: Patient instructed to return to the office for a follow up visit 3 months for continued evaluation and treatment.   Helane Gunther DPM

## 2017-08-27 ENCOUNTER — Other Ambulatory Visit: Payer: Self-pay

## 2017-08-27 MED ORDER — NITROGLYCERIN 0.4 MG SL SUBL: 0.4000 mg | SUBLINGUAL_TABLET | SUBLINGUAL | 2 refills | Status: DC | PRN

## 2017-09-03 ENCOUNTER — Other Ambulatory Visit: Payer: Medicare Other | Admitting: Orthotics

## 2017-09-17 ENCOUNTER — Ambulatory Visit (INDEPENDENT_AMBULATORY_CARE_PROVIDER_SITE_OTHER): Payer: Medicare Other | Admitting: Orthotics

## 2017-09-17 DIAGNOSIS — B351 Tinea unguium: Secondary | ICD-10-CM

## 2017-09-17 DIAGNOSIS — M2010 Hallux valgus (acquired), unspecified foot: Secondary | ICD-10-CM

## 2017-09-17 DIAGNOSIS — E1142 Type 2 diabetes mellitus with diabetic polyneuropathy: Secondary | ICD-10-CM

## 2017-09-17 DIAGNOSIS — M79674 Pain in right toe(s): Secondary | ICD-10-CM

## 2017-09-17 DIAGNOSIS — M79675 Pain in left toe(s): Secondary | ICD-10-CM

## 2017-09-19 ENCOUNTER — Other Ambulatory Visit: Payer: Self-pay | Admitting: Cardiovascular Disease

## 2017-09-23 NOTE — Progress Notes (Signed)

## 2017-10-18 ENCOUNTER — Other Ambulatory Visit: Payer: Self-pay | Admitting: Cardiovascular Disease

## 2017-11-01 ENCOUNTER — Encounter: Payer: Self-pay | Admitting: Cardiovascular Disease

## 2017-11-01 ENCOUNTER — Ambulatory Visit: Payer: Medicare Other | Admitting: Cardiovascular Disease

## 2017-11-01 VITALS — BP 146/78 | HR 59 | Ht 62.0 in | Wt 213.0 lb

## 2017-11-01 DIAGNOSIS — I2510 Atherosclerotic heart disease of native coronary artery without angina pectoris: Secondary | ICD-10-CM

## 2017-11-01 DIAGNOSIS — E7800 Pure hypercholesterolemia, unspecified: Secondary | ICD-10-CM | POA: Diagnosis not present

## 2017-11-01 DIAGNOSIS — I10 Essential (primary) hypertension: Secondary | ICD-10-CM | POA: Diagnosis not present

## 2017-11-01 DIAGNOSIS — I5043 Acute on chronic combined systolic (congestive) and diastolic (congestive) heart failure: Secondary | ICD-10-CM

## 2017-11-01 NOTE — Progress Notes (Signed)
11/01/2017 [name]   1931/07/13  [id number]  Primary Physician Willey Blade, MD Primary Cardiologist: Lorretta Harp MD FACP, [place], [place], [place]  HPI:  [name] is a 83 y.o.  moderately overweight widowed African American female, mother of 61, grandmother to 23 grandchildren, who is accompanied by one of her sister's today. I saw her in the office 10/31/16.Marland Kitchen She has a history of probable nonischemic cardiomyopathy with moderate LV dysfunction by 2D echocardiogram, last checked June 03, 2012, with an EF of 35% to 40%. She was catheterized by Dr. Ellouise Newer, June 06, 2012, revealing a similar EF with no significant CAD, but this was in the setting of sepsis, requiring intubation, and congestive heart failure, responding to antibiotics and diuresis. Her other problems include hypertension, hyperlipidemia, and diabetes. She is otherwise asymptomatic. Since I saw her one year ago she's remained clinically stable. She has had some venous stasis ulcers on her legs followed by Dr. Steele Sizer at the [facility] wound care clinic. These ulcers have since healed and she wears compression stockings. She had a bladder tack operation for prolapsed bladder at [facility] on October 12 , 2017. Since I saw her year ago she's remained cardiac stable denying chest pain or shortness of breath.  Current Meds  Medication Sig  . ACCU-CHEK AVIVA PLUS test strip   . ACCU-CHEK SOFTCLIX LANCETS lancets   . acidophilus (RISAQUAD) CAPS Take 1 capsule by mouth daily.  Marland Kitchen aspirin 81 MG chewable tablet Chew 81 mg by mouth daily.   . benzonatate (TESSALON) 100 MG capsule   . Blood Glucose Monitoring Suppl (ACCU-CHEK AVIVA PLUS) W/DEVICE KIT   . calcium-vitamin D (OSCAL WITH D) 500-200 MG-UNIT per tablet Take 1 tablet by mouth daily.  . Coenzyme Q10 (CO Q-10) 200 MG CAPS Take 200 mg by mouth daily.  Marland Kitchen FeFum-FePoly-FA-B Cmp-C-Biot (INTEGRA PLUS) CAPS daily.   .  furosemide (LASIX) 20 MG tablet TAKE 1 TABLET BY MOUTH EVERY DAY  . guaiFENesin-dextromethorphan (ROBITUSSIN DM) 100-10 MG/5ML syrup Take 5 mLs by mouth every 4 (four) hours as needed for cough.  . hydrALAZINE (APRESOLINE) 50 MG tablet Take 1.5 tablets (75 mg total) by mouth 2 (two) times daily.  Marland Kitchen losartan (COZAAR) 100 MG tablet TAKE 1 TABLET (100 MG TOTAL) BY MOUTH DAILY.  . metFORMIN (GLUCOPHAGE) 500 MG tablet Take 500 mg by mouth 2 (two) times daily with a meal.  . metoprolol succinate (TOPROL-XL) 25 MG 24 hr tablet Take 0.5 tablets (12.5 mg total) by mouth daily. Take with or immediately following a meal.  . Mirabegron (MYRBETRIQ PO) Take by mouth.  . Multiple Vitamin (MULTI-VITAMIN PO) Take 1 tablet by mouth daily.  Marland Kitchen MYRBETRIQ 25 MG TB24 tablet TAKE 1 TABLET(S) EVERY DAY BY ORAL ROUTE.  . nitroGLYCERIN (NITROSTAT) 0.4 MG SL tablet Place 1 tablet (0.4 mg total) under the tongue every 5 (five) minutes as needed for chest pain.  Marland Kitchen omega-3 acid ethyl esters (LOVAZA) 1 G capsule Take 2 g by mouth daily.  Marland Kitchen omeprazole (PRILOSEC) 40 MG capsule Take 40 mg by mouth 2 (two) times daily.   . potassium chloride SA (K-DUR,KLOR-CON) 20 MEQ tablet TAKE 1 TABLET BY MOUTH ONCE DAILY  . simvastatin (ZOCOR) 20 MG tablet Take 20 mg by mouth every evening.  . vitamin C (ASCORBIC ACID) 500 MG tablet Take 500 mg by mouth daily.  . Zinc 50 MG TABS Take 1 tablet by mouth daily.  Allergies  Allergen Reactions  . Lisinopril Other (See Comments) and Cough    cough    Social History   Socioeconomic History  . Marital status: Widowed    Spouse name: Not on file  . Number of children: 6  . Years of education: Not on file  . Highest education level: Not on file  Social Needs  . Financial resource strain: Not on file  . Food insecurity - worry: Not on file  . Food insecurity - inability: Not on file  . Transportation needs - medical: Not on file  . Transportation needs - non-medical: Not on file    Occupational History  . Not on file  Tobacco Use  . Smoking status: Never Smoker  . Smokeless tobacco: Never Used  Substance and Sexual Activity  . Alcohol use: No    Comment: 11/30/11 "used to drink; not much; don't drink anymore"  . Drug use: No  . Sexual activity: No  Other Topics Concern  . Not on file  Social History Narrative  . Not on file     Review of Systems: General: negative for chills, fever, night sweats or weight changes.  Cardiovascular: negative for chest pain, dyspnea on exertion, edema, orthopnea, palpitations, paroxysmal nocturnal dyspnea or shortness of breath Dermatological: negative for rash Respiratory: negative for cough or wheezing Urologic: negative for hematuria Abdominal: negative for nausea, vomiting, diarrhea, bright red blood per rectum, melena, or hematemesis Neurologic: negative for visual changes, syncope, or dizziness All other systems reviewed and are otherwise negative except as noted above.    Blood pressure (!) 146/78, pulse (!) 59, height '5\' 2"'  (1.575 m), weight 213 lb (96.6 kg).  General appearance: alert and no distress Neck: no adenopathy, no carotid bruit, no JVD, supple, symmetrical, trachea midline and thyroid not enlarged, symmetric, no tenderness/mass/nodules Lungs: clear to auscultation bilaterally Heart: regular rate and rhythm, S1, S2 normal, no murmur, click, rub or gallop Extremities: extremities normal, atraumatic, no cyanosis or edema Pulses: 2+ and symmetric Skin: Skin color, texture, turgor normal. No rashes or lesions Neurologic: Alert and oriented X 3, normal strength and tone. Normal symmetric reflexes. Normal coordination and gait  EKG sinus bradycardia 59 without ST or T-wave changes. There was poor R-wave progression. I personally reviewed this EKG.  ASSESSMENT AND PLAN:   HTN (hypertension) History of essential hypertension blood pressure measured 146/78. She is on valsartan, metoprolol and hydralazine.  Continue current meds at current dosing.  CAD (coronary artery disease), mild, 20% cath 06/06/12 History of minimal CAD by cardiac catheterization performed 06/06/12.  Acute on chronic systolic and diastolic heart failure, NYHA class 2 (HCC) History of nonischemic cardiomyopathy with an EF in the 35-40% range documented essentially normal coronary arteries. Her last 2-D echo performed in 2015 showed an EF of 55-60%. She does have diastolic dysfunction as well.  Hyperlipidemia History of hyperlipidemia on statin therapy followed by her PCP      Lorretta Harp MD [facility], [facility] 11/01/2017 9:56 AM

## 2017-11-01 NOTE — Assessment & Plan Note (Signed)
History of hyperlipidemia on statin therapy followed by her PCP. 

## 2017-11-01 NOTE — Assessment & Plan Note (Signed)
History of minimal CAD by cardiac catheterization performed 06/06/12.

## 2017-11-01 NOTE — Assessment & Plan Note (Signed)
History of essential hypertension blood pressure measured 146/78. She is on valsartan, metoprolol and hydralazine. Continue current meds at current dosing.

## 2017-11-01 NOTE — Patient Instructions (Signed)

## 2017-11-01 NOTE — Assessment & Plan Note (Signed)
History of nonischemic cardiomyopathy with an EF in the 35-40% range documented essentially normal coronary arteries. Her last 2-D echo performed in 2015 showed an EF of 55-60%. She does have diastolic dysfunction as well.

## 2017-11-06 ENCOUNTER — Ambulatory Visit: Payer: Medicare Other | Admitting: Podiatry

## 2017-12-16 ENCOUNTER — Other Ambulatory Visit: Payer: Self-pay | Admitting: Cardiovascular Disease

## 2018-02-07 ENCOUNTER — Other Ambulatory Visit: Payer: Self-pay | Admitting: Cardiovascular Disease

## 2018-02-12 ENCOUNTER — Ambulatory Visit: Payer: Medicare Other | Admitting: Podiatry

## 2018-02-12 ENCOUNTER — Encounter: Payer: Self-pay | Admitting: Podiatry

## 2018-02-12 DIAGNOSIS — M79674 Pain in right toe(s): Secondary | ICD-10-CM | POA: Diagnosis not present

## 2018-02-12 DIAGNOSIS — E1142 Type 2 diabetes mellitus with diabetic polyneuropathy: Secondary | ICD-10-CM | POA: Diagnosis not present

## 2018-02-12 DIAGNOSIS — M79675 Pain in left toe(s): Secondary | ICD-10-CM

## 2018-02-12 DIAGNOSIS — B351 Tinea unguium: Secondary | ICD-10-CM | POA: Diagnosis not present

## 2018-02-12 NOTE — Progress Notes (Signed)
Patient ID: [name], female   DOB: 09/30/1931, 84 y.o.   MRN: [id number] Complaint:  Visit Type: Patient returns to my office for continued preventative foot care services. Complaint: Patient states" my nails have grown long and thick and become painful to walk and wear shoes" Patient has been diagnosed with DM with neuropathy.. The patient presents for preventative foot care services. No changes to ROS.   Podiatric Exam: Vascular: dorsalis pedis and posterior tibial pulses are palpable bilateral. Capillary return is immediate. Temperature gradient is WNL. Skin turgor WNL  Sensorium: Absent Semmes Weinstein monofilament test. Normal tactile sensation. Nail Exam: Pt has thick disfigured discolored nails with subungual debris noted bilateral entire nail hallux through fifth toenails Ulcer Exam: There is no evidence of ulcer or pre-ulcerative changes or infection. Orthopedic Exam: Muscle tone and strength are WNL. No limitations in general ROM. No crepitus or effusions noted. Foot type and digits show no abnormalities. Bony prominences are unremarkable. HAV B/L. Skin: No Porokeratosis. No infection or ulcers  Diagnosis:  Onychomycosis, , Pain in right toe, pain in left toes  Treatment & Plan Procedures and Treatment: Consent by patient was obtained for treatment procedures. The patient understood the discussion of treatment and procedures well. All questions were answered thoroughly reviewed. Debridement of mycotic and hypertrophic toenails, 1 through 5 bilateral and clearing of subungual debris. No ulceration, no infection noted.   HAV B/L. Left hallux toenail is minimally attached to the nail bed.  No infection. Return Visit-Office Procedure: Patient instructed to return to the office for a follow up visit 3 months for continued evaluation and treatment.   Helane Gunther DPM

## 2018-03-07 ENCOUNTER — Other Ambulatory Visit: Payer: Self-pay | Admitting: Cardiovascular Disease

## 2018-05-14 ENCOUNTER — Ambulatory Visit: Payer: Medicare Other | Admitting: Podiatry

## 2018-05-14 ENCOUNTER — Encounter: Payer: Self-pay | Admitting: Podiatry

## 2018-05-14 DIAGNOSIS — E1142 Type 2 diabetes mellitus with diabetic polyneuropathy: Secondary | ICD-10-CM | POA: Diagnosis not present

## 2018-05-14 DIAGNOSIS — B351 Tinea unguium: Secondary | ICD-10-CM

## 2018-05-14 DIAGNOSIS — M79675 Pain in left toe(s): Secondary | ICD-10-CM

## 2018-05-14 DIAGNOSIS — M79674 Pain in right toe(s): Secondary | ICD-10-CM

## 2018-05-14 NOTE — Progress Notes (Signed)
Patient ID: [name], female   DOB: February 11, 1931, 84 y.o.   MRN: [id number] Complaint:  Visit Type: Patient returns to my office for continued preventative foot care services. Complaint: Patient states" my nails have grown long and thick and become painful to walk and wear shoes" Patient has been diagnosed with DM with neuropathy.. The patient presents for preventative foot care services. No changes to ROS.   Podiatric Exam: Vascular: dorsalis pedis and posterior tibial pulses are palpable bilateral. Capillary return is immediate. Temperature gradient is WNL. Skin turgor WNL  Sensorium: Absent Semmes Weinstein monofilament test. Normal tactile sensation. Nail Exam: Pt has thick disfigured discolored nails with subungual debris noted bilateral entire nail hallux through fifth toenails Ulcer Exam: There is no evidence of ulcer or pre-ulcerative changes or infection. Orthopedic Exam: Muscle tone and strength are WNL. No limitations in general ROM. No crepitus or effusions noted. Foot type and digits show no abnormalities. Bony prominences are unremarkable. HAV B/L. Skin: No Porokeratosis. No infection or ulcers  Diagnosis:  Onychomycosis, , Pain in right toe, pain in left toes  Treatment & Plan Procedures and Treatment: Consent by patient was obtained for treatment procedures. The patient understood the discussion of treatment and procedures well. All questions were answered thoroughly reviewed. Debridement of mycotic and hypertrophic toenails, 1 through 5 bilateral and clearing of subungual debris. No ulceration, no infection noted.   HAV B/L.  No infection. Return Visit-Office Procedure: Patient instructed to return to the office for a follow up visit 3 months for continued evaluation and treatment.   Helane Gunther DPM

## 2018-06-02 ENCOUNTER — Other Ambulatory Visit: Payer: Self-pay | Admitting: Cardiovascular Disease

## 2018-08-13 ENCOUNTER — Ambulatory Visit: Payer: Medicare Other | Admitting: Podiatry

## 2018-08-13 ENCOUNTER — Encounter: Payer: Self-pay | Admitting: Podiatry

## 2018-08-13 DIAGNOSIS — B351 Tinea unguium: Secondary | ICD-10-CM

## 2018-08-13 DIAGNOSIS — M79675 Pain in left toe(s): Secondary | ICD-10-CM

## 2018-08-13 DIAGNOSIS — E1142 Type 2 diabetes mellitus with diabetic polyneuropathy: Secondary | ICD-10-CM

## 2018-08-13 DIAGNOSIS — M79674 Pain in right toe(s): Secondary | ICD-10-CM

## 2018-08-13 DIAGNOSIS — M2010 Hallux valgus (acquired), unspecified foot: Secondary | ICD-10-CM

## 2018-08-13 NOTE — Progress Notes (Signed)
Patient ID: [name], female   DOB: 06/06/1931, 84 y.o.   MRN: [id number] Complaint:  Visit Type: Patient returns to my office for continued preventative foot care services. Complaint: Patient states" my nails have grown long and thick and become painful to walk and wear shoes" Patient has been diagnosed with DM with neuropathy.. The patient presents for preventative foot care services. No changes to ROS.   Podiatric Exam: Vascular: dorsalis pedis and posterior tibial pulses are palpable bilateral. Capillary return is immediate. Temperature gradient is WNL. Skin turgor WNL  Sensorium: Absent Semmes Weinstein monofilament test. Normal tactile sensation. Nail Exam: Pt has thick disfigured discolored nails with subungual debris noted bilateral entire nail hallux through fifth toenails Ulcer Exam: There is no evidence of ulcer or pre-ulcerative changes or infection. Orthopedic Exam: Muscle tone and strength are WNL. No limitations in general ROM. No crepitus or effusions noted. Foot type and digits show no abnormalities. Bony prominences are unremarkable. HAV B/L. Skin: No Porokeratosis. No infection or ulcers  Diagnosis:  Onychomycosis, , Pain in right toe, pain in left toes  Treatment & Plan Procedures and Treatment: Consent by patient was obtained for treatment procedures. The patient understood the discussion of treatment and procedures well. All questions were answered thoroughly reviewed. Debridement of mycotic and hypertrophic toenails, 1 through 5 bilateral and clearing of subungual debris. No ulceration, no infection noted.   HAV B/L.  No infection. Return Visit-Office Procedure: Patient instructed to return to the office for a follow up visit 3 months for continued evaluation and treatment.   Davin Muramoto DPM 

## 2018-08-26 ENCOUNTER — Other Ambulatory Visit: Payer: Self-pay | Admitting: *Deleted

## 2018-08-26 MED ORDER — NITROGLYCERIN 0.4 MG SL SUBL: 0.4000 mg | SUBLINGUAL_TABLET | SUBLINGUAL | 2 refills | Status: DC | PRN

## 2018-08-26 NOTE — Telephone Encounter (Signed)
Rx has been sent to the pharmacy electronically. ° °

## 2018-10-02 ENCOUNTER — Other Ambulatory Visit: Payer: Self-pay | Admitting: Cardiovascular Disease

## 2018-10-03 NOTE — Telephone Encounter (Signed)
Rx request sent to pharmacy.  

## 2018-11-11 ENCOUNTER — Ambulatory Visit (INDEPENDENT_AMBULATORY_CARE_PROVIDER_SITE_OTHER): Payer: Medicare Other | Admitting: Cardiovascular Disease

## 2018-11-11 ENCOUNTER — Encounter: Payer: Self-pay | Admitting: Cardiovascular Disease

## 2018-11-11 DIAGNOSIS — E7800 Pure hypercholesterolemia, unspecified: Secondary | ICD-10-CM | POA: Diagnosis not present

## 2018-11-11 DIAGNOSIS — I2510 Atherosclerotic heart disease of native coronary artery without angina pectoris: Secondary | ICD-10-CM | POA: Diagnosis not present

## 2018-11-11 DIAGNOSIS — I10 Essential (primary) hypertension: Secondary | ICD-10-CM

## 2018-11-11 NOTE — Progress Notes (Signed)
11/11/2018 [name]   Sep 24, 1931  [id number]  Primary Physician Willey Blade, MD Primary Cardiologist: Lorretta Harp MD FACP, [place], [place], [place]  HPI:  [name] is a 84 y.o.  moderately overweight widowed African American female, mother of 76, grandmother to 39 grandchildren, who  I saw herin the office  11/01/2017.Marland Kitchen She has a history of probable nonischemic cardiomyopathy with moderate LV dysfunction by 2D echocardiogram, last checked June 03, 2012, with an EF of 35% to 40%. She was catheterized by Dr. Ellouise Newer, June 06, 2012, revealing a similar EF with no significant CAD, but this was in the setting of sepsis, requiring intubation, and congestive heart failure, responding to antibiotics and diuresis. Her other problems include hypertension, hyperlipidemia, and diabetes. She is otherwise asymptomatic. Since I saw her one year ago she's remained clinically stable. She has had some venous stasis ulcers on her legs followed by Dr. Steele Sizer at the [facility] wound care clinic. These ulcers have since healed and she wears compression stockings.She had a bladder tack operation for prolapsed bladder at [facility] on October 12 , 2017. Since I saw her year ago she's remained cardiac stable denying chest pain or shortness of breath.   Current Meds  Medication Sig  . ACCU-CHEK AVIVA PLUS test strip   . ACCU-CHEK SOFTCLIX LANCETS lancets   . acidophilus (RISAQUAD) CAPS Take 1 capsule by mouth daily.  Marland Kitchen aspirin 81 MG chewable tablet Chew 81 mg by mouth daily.   . benzonatate (TESSALON) 100 MG capsule   . Blood Glucose Monitoring Suppl (ACCU-CHEK AVIVA PLUS) W/DEVICE KIT   . Coenzyme Q10 (CO Q-10) 200 MG CAPS Take 200 mg by mouth daily.  Marland Kitchen FeFum-FePoly-FA-B Cmp-C-Biot (INTEGRA PLUS) CAPS daily.   . furosemide (LASIX) 20 MG tablet TAKE 1 TABLET BY MOUTH EVERY DAY  . hydrALAZINE (APRESOLINE) 50 MG tablet Take 1.5 tablets (75 mg total) by  mouth 2 (two) times daily.  Marland Kitchen losartan (COZAAR) 100 MG tablet TAKE 1 TABLET (100 MG TOTAL) BY MOUTH DAILY.  . metoprolol succinate (TOPROL-XL) 25 MG 24 hr tablet Take 0.5 tablets (12.5 mg total) by mouth daily. Take with or immediately following a meal.  . Multiple Vitamin (MULTI-VITAMIN PO) Take 1 tablet by mouth daily.  Marland Kitchen MYRBETRIQ 25 MG TB24 tablet TAKE 1 TABLET(S) EVERY DAY BY ORAL ROUTE.  . nitroGLYCERIN (NITROSTAT) 0.4 MG SL tablet Place 1 tablet (0.4 mg total) under the tongue every 5 (five) minutes as needed for chest pain.  Marland Kitchen omega-3 acid ethyl esters (LOVAZA) 1 G capsule Take 2 g by mouth daily.  Marland Kitchen omeprazole (PRILOSEC) 40 MG capsule Take 40 mg by mouth 2 (two) times daily.   . potassium chloride SA (K-DUR,KLOR-CON) 20 MEQ tablet Take 1 tablet (20 mEq total) by mouth daily.  . simvastatin (ZOCOR) 20 MG tablet Take 20 mg by mouth every evening.     Allergies  Allergen Reactions  . Lisinopril Other (See Comments) and Cough    cough cough    Social History   Socioeconomic History  . Marital status: Widowed    Spouse name: Not on file  . Number of children: 6  . Years of education: Not on file  . Highest education level: Not on file  Occupational History  . Not on file  Social Needs  . Financial resource strain: Not on file  . Food insecurity:    Worry: Not on file  Inability: Not on file  . Transportation needs:    Medical: Not on file    Non-medical: Not on file  Tobacco Use  . Smoking status: Never Smoker  . Smokeless tobacco: Never Used  Substance and Sexual Activity  . Alcohol use: No    Comment: 11/30/11 "used to drink; not much; don't drink anymore"  . Drug use: No  . Sexual activity: Never  Lifestyle  . Physical activity:    Days per week: Not on file    Minutes per session: Not on file  . Stress: Not on file  Relationships  . Social connections:    Talks on phone: Not on file    Gets together: Not on file    Attends religious service: Not on file     Active member of club or organization: Not on file    Attends meetings of clubs or organizations: Not on file    Relationship status: Not on file  . Intimate partner violence:    Fear of current or ex partner: Not on file    Emotionally abused: Not on file    Physically abused: Not on file    Forced sexual activity: Not on file  Other Topics Concern  . Not on file  Social History Narrative  . Not on file     Review of Systems: General: negative for chills, fever, night sweats or weight changes.  Cardiovascular: negative for chest pain, dyspnea on exertion, edema, orthopnea, palpitations, paroxysmal nocturnal dyspnea or shortness of breath Dermatological: negative for rash Respiratory: negative for cough or wheezing Urologic: negative for hematuria Abdominal: negative for nausea, vomiting, diarrhea, bright red blood per rectum, melena, or hematemesis Neurologic: negative for visual changes, syncope, or dizziness All other systems reviewed and are otherwise negative except as noted above.    Blood pressure 136/80, pulse 68, height '5\' 2"'  (1.575 m), weight 230 lb 12.8 oz (104.7 kg).  General appearance: alert and no distress Neck: no adenopathy, no carotid bruit, no JVD, supple, symmetrical, trachea midline and thyroid not enlarged, symmetric, no tenderness/mass/nodules Lungs: clear to auscultation bilaterally Heart: regular rate and rhythm, S1, S2 normal, no murmur, click, rub or gallop Extremities: extremities normal, atraumatic, no cyanosis or edema Pulses: 2+ and symmetric Skin: Skin color, texture, turgor normal. No rashes or lesions Neurologic: Alert and oriented X 3, normal strength and tone. Normal symmetric reflexes. Normal coordination and gait  EKG sinus rhythm at 68 with borderline evidence of LVH with repolarization changes.  Personally reviewed this EKG.  ASSESSMENT AND PLAN:   HTN (hypertension) History of essential hypertension her blood pressure measured  today 136/80.  She is on hydralazine, losartan and metoprolol.  Continue current meds at current dosing.  CAD (coronary artery disease), mild, 20% cath 06/06/12 History of minimal CAD by cardiac catheterization performed by Dr. Claiborne Billings 06/06/2012.  She denies chest pain or shortness of breath.  Hyperlipidemia History of hyperlipidemia on statin therapy followed by her PCP  Mitral regurgitation, mod by echo 06/03/12 Her last 2D echo performed 11/09/2014 showed normal LV systolic function with mild to moderate MR.  She is otherwise asymptomatic.      Lorretta Harp MD FACP,FACC,FAHA, [facility] 11/11/2018 9:22 AM

## 2018-11-11 NOTE — Assessment & Plan Note (Signed)
History of minimal CAD by cardiac catheterization performed by Dr. Tresa Endo 06/06/2012.  She denies chest pain or shortness of breath.

## 2018-11-11 NOTE — Assessment & Plan Note (Signed)
History of essential hypertension her blood pressure measured today 136/80.  She is on hydralazine, losartan and metoprolol.  Continue current meds at current dosing.

## 2018-11-11 NOTE — Assessment & Plan Note (Signed)
History of hyperlipidemia on statin therapy followed by her PCP. 

## 2018-11-11 NOTE — Assessment & Plan Note (Signed)
Her last 2D echo performed 11/09/2014 showed normal LV systolic function with mild to moderate MR.  She is otherwise asymptomatic.

## 2018-11-11 NOTE — Patient Instructions (Signed)
Medication Instructions:  Your physician recommends that you continue on your current medications as directed. Please refer to the Current Medication list given to you today.  If you need a refill on your cardiac medications before your next appointment, please call your pharmacy.   Lab work: NONE If you have labs (blood work) drawn today and your tests are completely normal, you will receive your results only by: . MyChart Message (if you have MyChart) OR . A paper copy in the mail If you have any lab test that is abnormal or we need to change your treatment, we will call you to review the results.  Testing/Procedures: NONE  Follow-Up: At [facility], you and your health needs are our priority.  As part of our continuing mission to provide you with exceptional heart care, we have created designated Provider Care Teams.  These Care Teams include your primary Cardiologist (physician) and Advanced Practice Providers (APPs -  Physician Assistants and Nurse Practitioners) who all work together to provide you with the care you need, when you need it. You will need a follow up appointment in 12 months.  Please call our office 2 months in advance to schedule this appointment.  You may see DR. BERRY or one of the following Advanced Practice Providers on your designated Care Team:   Luke Kilroy, PA-C Krista Kroeger, PA-C . Callie Goodrich, PA-C    

## 2018-11-12 ENCOUNTER — Ambulatory Visit: Payer: Medicare Other | Admitting: Podiatry

## 2018-12-01 ENCOUNTER — Other Ambulatory Visit: Payer: Self-pay | Admitting: Cardiovascular Disease

## 2018-12-26 ENCOUNTER — Other Ambulatory Visit: Payer: Self-pay | Admitting: Cardiovascular Disease

## 2019-02-20 ENCOUNTER — Other Ambulatory Visit: Payer: Self-pay | Admitting: Cardiovascular Disease

## 2019-02-23 ENCOUNTER — Telehealth: Payer: Self-pay | Admitting: Cardiovascular Disease

## 2019-02-23 NOTE — Telephone Encounter (Signed)
Pt wasn't on a diuretic when I saw her 3 months ago

## 2019-02-23 NOTE — Telephone Encounter (Signed)
New Message   Ellyn Hack with [facility] is calling to report that the patient lost 6lbs in 3 days. Patient has been taken 40mg  of lasix. She denies any symptoms. [facility] nurse will fax over the documentation. She currently weight 225.4

## 2019-02-25 NOTE — Telephone Encounter (Signed)
° °  Pam from [facility] calling to report weight today is 223.2

## 2019-02-25 NOTE — Telephone Encounter (Signed)
Can come in to see a midlevel provider sometime within the next 6 to 8 weeks

## 2019-03-06 ENCOUNTER — Telehealth: Payer: Self-pay | Admitting: Cardiovascular Disease

## 2019-03-06 NOTE — Telephone Encounter (Signed)
Per previous phone note patient was loosing weight. Per patient feels good, no shortness of breath or swelling. She thinks her scales need new batteries and they are sending her new ones. Patient is taking Lasix 20 mg daily but takes extra based on weight gain. Reviewed the 3 pounds in 24 hours and 5 pounds in week with patient. Took 2 tablets this am. Advised ok to take 2 again tomorrow if no significant improvement, watch salt intake, and call back if any further problems. Also advised would forward to covering and call her back if any additional recommendations. Patient verbalized understanding. Will forward to A Duke PA for review     Left message with Elita Quick will reach out to patient

## 2019-03-06 NOTE — Telephone Encounter (Signed)
I agree. Thank you.  I attempted to call and check on patient, no answer and no ability to leave VM.

## 2019-03-06 NOTE — Telephone Encounter (Signed)
Pam called to report patient gained 7.6 lbs in two days, this morning she weighed 231. Normal weight is 229.  No SOB, no swelling no chest pain.  She will send fax with same information.

## 2019-03-20 ENCOUNTER — Telehealth: Payer: Self-pay | Admitting: Cardiovascular Disease

## 2019-03-20 NOTE — Telephone Encounter (Signed)
New Message:    Pam from [employer] case manager calling to report patient weight gain. patient gain 225.5 and BP 169/705.9 pound in three days. Also patient feet are swollen. Patient took 40 gm of laxis today. Please call Pam concering patient.

## 2019-03-20 NOTE — Telephone Encounter (Signed)
Spoke with Ms. Trinkle. She states [employer] issued her a new scale and she started using in on Monday. It is "very fancy."  It checks her weight and her blood pressure.  Her readings over there last 3 days are: 217 lbs, 219 lbs yesterday, and 225 lbs this AM. She states she weighs herself about the same time every morning under the same circumstances.  She reports "slight" swelling in her feet only. Her shoes still fit. She reports no other symptoms and specifically denies SOB.  She has already taken an extra Lasix 40 mg today as previously directed by Dr. Allyson Sabal for weight gain.  Reviewed how to ideally weight (in the morning, either disrobed or with the same amount of clothes on, after using the bathroom and before eating).  Reiterated to her to limit her salt intake and elevate legs while sitting.  She will continue to monitor weight and will call next week for an update (or earlier if symptoms worsen). She was grateful for assistance.

## 2019-03-23 NOTE — Telephone Encounter (Signed)
Patient called today to give weight from over the weekend, Friday 225.5, BP 169/?? (didn't write bottom # down)         Saturday 225.0, BP 156/?? (didn't write bottom # down)         Sunday   221.0, BP 137/75         Monday  221.0, BP 148/58, pluse 54

## 2019-03-23 NOTE — Telephone Encounter (Signed)
Recall in epic. 

## 2019-03-24 NOTE — Telephone Encounter (Signed)
All of these numbers look fine with me.

## 2019-03-30 ENCOUNTER — Telehealth: Payer: Self-pay | Admitting: Cardiovascular Disease

## 2019-03-30 NOTE — Telephone Encounter (Signed)
Spoke with pt who states she is 'doing fine' and has no Sx complaints. Pt states that she is taking meds below as follows:  valsartan 325 mg once a day (Per pt, PCP Dr. Renae Gloss replaced losartan with this)  amlodipine 10 mg once a day  hydralazine 50 mg twice a day  metoprolol 25mg  a day per Dr. Renae Gloss (current instructions in Epic are to take 12.5 mg daily)  Lasix 20 mg once a day. Pt states that she was instructed by Dr. Allyson Sabal to take two tabs (40 mg total) PRN If gain 3lbs or more  BP 155/59 Wt 215  Please review

## 2019-03-30 NOTE — Telephone Encounter (Signed)
Looks good to me

## 2019-03-30 NOTE — Telephone Encounter (Signed)
° °  Pam from [facility] calling to report Any questions she can be reached at [phone] ext [phone]   Patient BP 155/59 Weight today 215, Sunday 219.4, Saturday 217

## 2019-04-07 ENCOUNTER — Telehealth: Payer: Self-pay | Admitting: Cardiovascular Disease

## 2019-04-07 NOTE — Telephone Encounter (Signed)
Called patient- she states that she only has swelling in her left ankle- she states she weighed yesterday and it was 213.8, and today it was 217. She denies any other symptoms (SOB, chest pain, diet changes).  Patient advised no medication changes, but states she is on 20 mg of lasix, and per last message from Dr.Berry okay to increase to 40 mg PRN if weight gain. Patient advised to take the extra 20 today to see if the swelling goes down. Patient verbalized understanding, will call back if weight continues to increase.

## 2019-04-07 NOTE — Telephone Encounter (Signed)
New Message   Pt c/o swelling: STAT is pt has developed SOB within 24 hours  1) How much weight have you gained and in what time span? 5lb in 3 days   2) If swelling, where is the swelling located? Ankles   3) Are you currently taking a fluid pill? Yes   4) Are you currently SOB? no  5) Do you have a log of your daily weights (if so, list)? Sat 212, Mon 213.8 Today 217  6) Have you gained 3 pounds in a day or 5 pounds in a week? yes  7) Have you traveled recently? No

## 2019-04-17 ENCOUNTER — Other Ambulatory Visit: Payer: Self-pay | Admitting: Cardiovascular Disease

## 2019-04-20 ENCOUNTER — Telehealth: Payer: Self-pay | Admitting: *Deleted

## 2019-04-20 NOTE — Telephone Encounter (Signed)
Furosemide 20 mg refilled. 

## 2019-04-20 NOTE — Telephone Encounter (Signed)
A message was left.re: the daughter give use a call for her mother's follow up visit.

## 2019-05-06 NOTE — Telephone Encounter (Signed)
Spoke with pt and made aware that Dr. Allyson Sabal looked over meds, BP, and wt reported at the time and they looked good to him. Pt verbalized understanding

## 2019-05-06 NOTE — Telephone Encounter (Signed)
Spoke with pt. Pt aware that Dr. Allyson Sabal fine with numbers. Pt verbalized understanding

## 2019-05-07 NOTE — Telephone Encounter (Signed)
Has ov 6/12

## 2019-05-15 ENCOUNTER — Encounter: Payer: Self-pay | Admitting: Cardiovascular Disease

## 2019-05-15 ENCOUNTER — Ambulatory Visit (INDEPENDENT_AMBULATORY_CARE_PROVIDER_SITE_OTHER): Payer: Medicare Other | Admitting: Cardiovascular Disease

## 2019-05-15 ENCOUNTER — Other Ambulatory Visit: Payer: Self-pay

## 2019-05-15 ENCOUNTER — Ambulatory Visit: Payer: Medicare Other | Admitting: Cardiovascular Disease

## 2019-05-15 DIAGNOSIS — I519 Heart disease, unspecified: Secondary | ICD-10-CM

## 2019-05-15 DIAGNOSIS — E782 Mixed hyperlipidemia: Secondary | ICD-10-CM | POA: Diagnosis not present

## 2019-05-15 DIAGNOSIS — I214 Non-ST elevation (NSTEMI) myocardial infarction: Secondary | ICD-10-CM

## 2019-05-15 DIAGNOSIS — I10 Essential (primary) hypertension: Secondary | ICD-10-CM | POA: Diagnosis not present

## 2019-05-15 NOTE — Assessment & Plan Note (Signed)
History of non-STEMI in the Past with cath performed Dr. Claiborne Billings 06/06/2012 revealing no significant CAD.

## 2019-05-15 NOTE — Patient Instructions (Addendum)
Medication Instructions:  Your physician recommends that you continue on your current medications as directed. Please refer to the Current Medication list given to you today.  If you need a refill on your cardiac medications before your next appointment, please call your pharmacy.   Lab work: PLEASE HAVE DR. SHELTON'S OFFICE FAX YOUR RECENT BLOOD WORK (INCLUDING YOUR LIPID PROFILE) TO [facility] [place]. Gwenlyn Found FAX #: [phone] If you have labs (blood work) drawn today and your tests are completely normal, you will receive your results only by: Marland Kitchen MyChart Message (if you have MyChart) OR . A paper copy in the mail If you have any lab test that is abnormal or we need to change your treatment, we will call you to review the results.  Testing/Procedures: NONE  Follow-Up: At [facility], you and your health needs are our priority.  As part of our continuing mission to provide you with exceptional heart care, we have created designated Provider Care Teams.  These Care Teams include your primary Cardiologist (physician) and Advanced Practice Providers (APPs -  Physician Assistants and Nurse Practitioners) who all work together to provide you with the care you need, when you need it. You will need a follow up appointment in 12 months WITH DR. Gwenlyn Found.  Please call our office 2 months in advance to schedule this appointment.

## 2019-05-15 NOTE — Assessment & Plan Note (Signed)
History of diastolic heart failure with 2D echo performed 11/09/2014 revealing normal LV function and grade 1 diastolic dysfunction.  She is on a diuretic.

## 2019-05-15 NOTE — Assessment & Plan Note (Signed)
History of hyperlipidemia on statin therapy followed by her PCP. 

## 2019-05-15 NOTE — Progress Notes (Signed)
05/15/2019 [name]   1931/05/21  [id number]  Primary Physician Willey Blade, MD Primary Cardiologist: Lorretta Harp MD FACP, [place], [place], [place]  HPI:  [name] is a 85 y.o.  moderately overweight widowed African American female, mother of 29, grandmother to 71 grandchildren, who  I saw herin the office  11/11/2018.Marland Kitchen She has a history of probable nonischemic cardiomyopathy with moderate LV dysfunction by 2D echocardiogram, last checked June 03, 2012, with an EF of 35% to 40%. She was catheterized by Dr. Ellouise Newer, June 06, 2012, revealing a similar EF with no significant CAD, but this was in the setting of sepsis, requiring intubation, and congestive heart failure, responding to antibiotics and diuresis. Her other problems include hypertension, hyperlipidemia, and diabetes. She is otherwise asymptomatic. Since I saw her one year ago she's remained clinically stable. She has had some venous stasis ulcers on her legs followed by Dr. Steele Sizer at the [facility] wound care clinic. These ulcers have since healed and she wears compression stockings.She had a bladder tack operation for prolapsed bladder at [facility] on October 12,2017.   Since I saw her 6 months ago she is remained stable.  She denies chest pain or shortness of breath.  Her lipid profile is followed by her PCP.  She is sheltering in place and socially distancing per guidelines.   Current Meds  Medication Sig  . ACCU-CHEK AVIVA PLUS test strip   . ACCU-CHEK SOFTCLIX LANCETS lancets   . acidophilus (RISAQUAD) CAPS Take 1 capsule by mouth daily.  Marland Kitchen aspirin 81 MG chewable tablet Chew 81 mg by mouth daily.   . benzonatate (TESSALON) 100 MG capsule   . Blood Glucose Monitoring Suppl (ACCU-CHEK AVIVA PLUS) W/DEVICE KIT   . calcium-vitamin D (OSCAL WITH D) 500-200 MG-UNIT per tablet Take 1 tablet by mouth daily.  . Coenzyme Q10 (CO Q-10) 200 MG CAPS Take 200 mg by mouth  daily.  Marland Kitchen FeFum-FePoly-FA-B Cmp-C-Biot (INTEGRA PLUS) CAPS daily.   . furosemide (LASIX) 20 MG tablet TAKE 1 TABLET BY MOUTH EVERY DAY  . guaiFENesin-dextromethorphan (ROBITUSSIN DM) 100-10 MG/5ML syrup Take 5 mLs by mouth every 4 (four) hours as needed for cough.  . hydrALAZINE (APRESOLINE) 50 MG tablet Take 1.5 tablets (75 mg total) by mouth 2 (two) times daily.  Marland Kitchen losartan (COZAAR) 100 MG tablet TAKE 1 TABLET (100 MG TOTAL) BY MOUTH DAILY.  . metoprolol succinate (TOPROL-XL) 25 MG 24 hr tablet Take 0.5 tablets (12.5 mg total) by mouth daily. Take with or immediately following a meal.  . Multiple Vitamin (MULTI-VITAMIN PO) Take 1 tablet by mouth daily.  Marland Kitchen MYRBETRIQ 25 MG TB24 tablet TAKE 1 TABLET(S) EVERY DAY BY ORAL ROUTE.  . nitroGLYCERIN (NITROSTAT) 0.4 MG SL tablet Place 1 tablet (0.4 mg total) under the tongue every 5 (five) minutes as needed for chest pain.  Marland Kitchen omega-3 acid ethyl esters (LOVAZA) 1 G capsule Take 2 g by mouth daily.  Marland Kitchen omeprazole (PRILOSEC) 40 MG capsule Take 40 mg by mouth 2 (two) times daily.   . potassium chloride SA (K-DUR,KLOR-CON) 20 MEQ tablet TAKE 1 TABLET BY MOUTH DAILY  . simvastatin (ZOCOR) 20 MG tablet Take 20 mg by mouth every evening.     Allergies  Allergen Reactions  . Lisinopril Other (See Comments) and Cough    cough cough    Social History   Socioeconomic History  . Marital status: Widowed    Spouse name:  Not on file  . Number of children: 6  . Years of education: Not on file  . Highest education level: Not on file  Occupational History  . Not on file  Social Needs  . Financial resource strain: Not on file  . Food insecurity    Worry: Not on file    Inability: Not on file  . Transportation needs    Medical: Not on file    Non-medical: Not on file  Tobacco Use  . Smoking status: Never Smoker  . Smokeless tobacco: Never Used  Substance and Sexual Activity  . Alcohol use: No    Comment: 11/30/11 "used to drink; not much; don't drink  anymore"  . Drug use: No  . Sexual activity: Never  Lifestyle  . Physical activity    Days per week: Not on file    Minutes per session: Not on file  . Stress: Not on file  Relationships  . Social Herbalist on phone: Not on file    Gets together: Not on file    Attends religious service: Not on file    Active member of club or organization: Not on file    Attends meetings of clubs or organizations: Not on file    Relationship status: Not on file  . Intimate partner violence    Fear of current or ex partner: Not on file    Emotionally abused: Not on file    Physically abused: Not on file    Forced sexual activity: Not on file  Other Topics Concern  . Not on file  Social History Narrative  . Not on file     Review of Systems: General: negative for chills, fever, night sweats or weight changes.  Cardiovascular: negative for chest pain, dyspnea on exertion, edema, orthopnea, palpitations, paroxysmal nocturnal dyspnea or shortness of breath Dermatological: negative for rash Respiratory: negative for cough or wheezing Urologic: negative for hematuria Abdominal: negative for nausea, vomiting, diarrhea, bright red blood per rectum, melena, or hematemesis Neurologic: negative for visual changes, syncope, or dizziness All other systems reviewed and are otherwise negative except as noted above.    Blood pressure (!) 148/60, pulse 82, temperature 98.2 F (36.8 C), height '5\' 2"'  (1.575 m), weight 235 lb (106.6 kg), SpO2 98 %.  General appearance: alert and no distress Neck: no adenopathy, no carotid bruit, no JVD, supple, symmetrical, trachea midline and thyroid not enlarged, symmetric, no tenderness/mass/nodules Lungs: clear to auscultation bilaterally Heart: regular rate and rhythm, S1, S2 normal, no murmur, click, rub or gallop Extremities: extremities normal, atraumatic, no cyanosis or edema Pulses: 2+ and symmetric Skin: Skin color, texture, turgor normal. No rashes  or lesions Neurologic: Alert and oriented X 3, normal strength and tone. Normal symmetric reflexes. Normal coordination and gait  EKG not performed today  ASSESSMENT AND PLAN:   HTN (hypertension) History of essential hypertension blood pressure measured today at 148/60.  She is on losartan and metoprolol.  NSTEMI, Troponin 1.22 06/03/12 History of non-STEMI in the Past with cath performed Dr. Claiborne Billings 06/06/2012 revealing no significant CAD.  Diastolic dysfunction, left ventricle, grade 2 by echo 06/04/12 History of diastolic heart failure with 2D echo performed 11/09/2014 revealing normal LV function and grade 1 diastolic dysfunction.  She is on a diuretic.  Hyperlipidemia History of hyperlipidemia on statin therapy followed by her PCP      Lorretta Harp MD [facility], [facility] 05/15/2019 9:47 AM

## 2019-05-15 NOTE — Assessment & Plan Note (Signed)
History of essential hypertension blood pressure measured today at 148/60.  She is on losartan and metoprolol.

## 2019-05-29 ENCOUNTER — Encounter: Payer: Self-pay | Admitting: Podiatry

## 2019-05-29 ENCOUNTER — Other Ambulatory Visit: Payer: Self-pay

## 2019-05-29 ENCOUNTER — Ambulatory Visit: Payer: Medicare Other | Admitting: Podiatry

## 2019-05-29 DIAGNOSIS — E1142 Type 2 diabetes mellitus with diabetic polyneuropathy: Secondary | ICD-10-CM

## 2019-05-29 DIAGNOSIS — B351 Tinea unguium: Secondary | ICD-10-CM

## 2019-05-29 DIAGNOSIS — M79675 Pain in left toe(s): Secondary | ICD-10-CM

## 2019-05-29 DIAGNOSIS — M79674 Pain in right toe(s): Secondary | ICD-10-CM

## 2019-05-29 NOTE — Progress Notes (Addendum)
Patient ID: [name], female   DOB: 16-May-1931, 85 y.o.   MRN: [id number] Complaint:  Visit Type: Patient returns to my office for continued preventative foot care services. Complaint: Patient states" my nails have grown long and thick and become painful to walk and wear shoes" Patient has been diagnosed with DM with neuropathy.. The patient presents for preventative foot care services. No changes to ROS.   Podiatric Exam: Vascular: dorsalis pedis and posterior tibial pulses are palpable bilateral. Capillary return is immediate. Temperature gradient is WNL. Skin turgor WNL  Sensorium: Absent Semmes Weinstein monofilament test. Normal tactile sensation. Nail Exam: Pt has thick disfigured discolored nails with subungual debris noted bilateral entire nail hallux through fifth toenails Ulcer Exam: There is no evidence of ulcer or pre-ulcerative changes or infection. Orthopedic Exam: Muscle tone and strength are WNL. No limitations in general ROM. No crepitus or effusions noted. Foot type and digits show no abnormalities. Bony prominences are unremarkable. HAV B/L. Skin: No Porokeratosis. No infection or ulcers  Diagnosis:  Onychomycosis, , Pain in right toe, pain in left toes  Treatment & Plan Procedures and Treatment: Consent by patient was obtained for treatment procedures. The patient understood the discussion of treatment and procedures well. All questions were answered thoroughly reviewed. Debridement of mycotic and hypertrophic toenails, 1 through 5 bilateral and clearing of subungual debris. No ulceration, no infection noted.    Return Visit-Office Procedure: Patient instructed to return to the office for a follow up visit 3 months for continued evaluation and treatment.   Gardiner Barefoot DPM

## 2019-07-08 ENCOUNTER — Other Ambulatory Visit: Payer: Self-pay | Admitting: Cardiovascular Disease

## 2019-09-02 ENCOUNTER — Encounter: Payer: Self-pay | Admitting: Podiatry

## 2019-09-02 ENCOUNTER — Other Ambulatory Visit: Payer: Self-pay

## 2019-09-02 ENCOUNTER — Ambulatory Visit: Payer: Medicare Other | Admitting: Podiatry

## 2019-09-02 DIAGNOSIS — E1142 Type 2 diabetes mellitus with diabetic polyneuropathy: Secondary | ICD-10-CM

## 2019-09-02 DIAGNOSIS — M79675 Pain in left toe(s): Secondary | ICD-10-CM

## 2019-09-02 DIAGNOSIS — M79674 Pain in right toe(s): Secondary | ICD-10-CM | POA: Diagnosis not present

## 2019-09-02 DIAGNOSIS — B351 Tinea unguium: Secondary | ICD-10-CM | POA: Diagnosis not present

## 2019-09-02 NOTE — Progress Notes (Signed)
Patient ID: [name], female   DOB: 02/18/1931, 85 y.o.   MRN: [id number] Complaint:  Visit Type: Patient returns to my office for continued preventative foot care services. Complaint: Patient states" my nails have grown long and thick and become painful to walk and wear shoes" Patient has been diagnosed with DM with neuropathy.. The patient presents for preventative foot care services. No changes to ROS.   Podiatric Exam: Vascular: dorsalis pedis and posterior tibial pulses are palpable bilateral. Capillary return is immediate. Temperature gradient is WNL. Skin turgor WNL  Sensorium: Absent Semmes Weinstein monofilament test. Normal tactile sensation. Nail Exam: Pt has thick disfigured discolored nails with subungual debris noted bilateral entire nail hallux through fifth toenails Ulcer Exam: There is no evidence of ulcer or pre-ulcerative changes or infection. Orthopedic Exam: Muscle tone and strength are WNL. No limitations in general ROM. No crepitus or effusions noted. Foot type and digits show no abnormalities. Bony prominences are unremarkable. HAV B/L. Skin: No Porokeratosis. No infection or ulcers  Diagnosis:  Onychomycosis, , Pain in right toe, pain in left toes  Treatment & Plan Procedures and Treatment: Consent by patient was obtained for treatment procedures. The patient understood the discussion of treatment and procedures well. All questions were answered thoroughly reviewed. Debridement of mycotic and hypertrophic toenails, 1 through 5 bilateral and clearing of subungual debris. No ulceration, no infection noted.    Return Visit-Office Procedure: Patient instructed to return to the office for a follow up visit 3 months for continued evaluation and treatment.   Markiyah Gahm DPM 

## 2019-09-11 ENCOUNTER — Other Ambulatory Visit: Payer: Self-pay

## 2019-09-11 MED ORDER — NITROGLYCERIN 0.4 MG SL SUBL: 0.4000 mg | SUBLINGUAL_TABLET | SUBLINGUAL | 2 refills | Status: DC | PRN

## 2019-12-02 ENCOUNTER — Ambulatory Visit: Payer: Medicare Other | Admitting: Podiatry

## 2019-12-02 ENCOUNTER — Other Ambulatory Visit: Payer: Self-pay

## 2019-12-02 ENCOUNTER — Encounter: Payer: Self-pay | Admitting: Podiatry

## 2019-12-02 DIAGNOSIS — M2010 Hallux valgus (acquired), unspecified foot: Secondary | ICD-10-CM

## 2019-12-02 DIAGNOSIS — M79674 Pain in right toe(s): Secondary | ICD-10-CM

## 2019-12-02 DIAGNOSIS — E1142 Type 2 diabetes mellitus with diabetic polyneuropathy: Secondary | ICD-10-CM

## 2019-12-02 DIAGNOSIS — B351 Tinea unguium: Secondary | ICD-10-CM | POA: Diagnosis not present

## 2019-12-02 DIAGNOSIS — M79675 Pain in left toe(s): Secondary | ICD-10-CM | POA: Diagnosis not present

## 2019-12-02 NOTE — Progress Notes (Signed)
Patient ID: [name], female   DOB: Mar 01, 1931, 85 y.o.   MRN: [id number] Complaint:  Visit Type: Patient returns to my office for continued preventative foot care services. Complaint: Patient states" my nails have grown long and thick and become painful to walk and wear shoes" Patient has been diagnosed with DM with neuropathy.. The patient presents for preventative foot care services. No changes to ROS.   Podiatric Exam: Vascular: dorsalis pedis and posterior tibial pulses are palpable bilateral. Capillary return is immediate. Temperature gradient is WNL. Skin turgor WNL  Sensorium: Absent Semmes Weinstein monofilament test. Normal tactile sensation. Nail Exam: Pt has thick disfigured discolored nails with subungual debris noted bilateral entire nail hallux through fifth toenails Ulcer Exam: There is no evidence of ulcer or pre-ulcerative changes or infection. Orthopedic Exam: Muscle tone and strength are WNL. No limitations in general ROM. No crepitus or effusions noted. Foot type and digits show no abnormalities. Bony prominences are unremarkable. HAV B/L. Skin: No Porokeratosis. No infection or ulcers  Diagnosis:  Onychomycosis, , Pain in right toe, pain in left toes  Treatment & Plan Procedures and Treatment: Consent by patient was obtained for treatment procedures. The patient understood the discussion of treatment and procedures well. All questions were answered thoroughly reviewed. Debridement of mycotic and hypertrophic toenails, 1 through 5 bilateral and clearing of subungual debris. No ulceration, no infection noted.    Return Visit-Office Procedure: Patient instructed to return to the office for a follow up visit 3 months for continued evaluation and treatment.   Gardiner Barefoot DPM

## 2020-03-02 ENCOUNTER — Ambulatory Visit: Payer: Medicare PPO | Admitting: Podiatry

## 2020-03-02 ENCOUNTER — Other Ambulatory Visit: Payer: Self-pay

## 2020-03-02 ENCOUNTER — Encounter: Payer: Self-pay | Admitting: Podiatry

## 2020-03-02 VITALS — Temp 96.2°F

## 2020-03-02 DIAGNOSIS — Q828 Other specified congenital malformations of skin: Secondary | ICD-10-CM

## 2020-03-02 DIAGNOSIS — E1142 Type 2 diabetes mellitus with diabetic polyneuropathy: Secondary | ICD-10-CM

## 2020-03-02 DIAGNOSIS — M79675 Pain in left toe(s): Secondary | ICD-10-CM | POA: Diagnosis not present

## 2020-03-02 DIAGNOSIS — M79674 Pain in right toe(s): Secondary | ICD-10-CM

## 2020-03-02 DIAGNOSIS — B351 Tinea unguium: Secondary | ICD-10-CM

## 2020-03-02 NOTE — Progress Notes (Signed)
This patient returns to my office for at risk foot care.  This patient requires this care by a professional since this patient will be at risk due to having diabetes.   This patient is unable to cut nails herself since the patient cannot reach her nails.These nails are painful walking and wearing shoes.  This patient presents for at risk foot care today.  General Appearance  Alert, conversant and in no acute stress.  Vascular  Dorsalis pedis and posterior tibial  pulses are palpable  bilaterally.  Capillary return is within normal limits  bilaterally. Temperature is within normal limits  bilaterally.  Neurologic  Senn-Weinstein monofilament wire test absent   bilaterally. Muscle power within normal limits bilaterally.  Nails Thick disfigured discolored nails with subungual debris  from hallux to fifth toes bilaterally. No evidence of bacterial infection or drainage bilaterally.  Orthopedic  No limitations of motion  feet .  No crepitus or effusions noted.  No bony pathology or digital deformities noted.  Skin  normotropic skin with no porokeratosis noted bilaterally.  No signs of infections or ulcers noted.   Callus right forefoot.  Onychomycosis  Pain in right toes  Pain in left toes  Porokeratosis  Right foot.  Consent was obtained for treatment procedures.   Mechanical debridement of nails 1-5  bilaterally performed with a nail nipper.  Filed with dremel without incident. Debridemed the callus with # 15 blade.   Return office visit 3 months                    Told patient to return for periodic foot care and evaluation due to potential at risk complications.   Helane Gunther DPM

## 2020-03-09 ENCOUNTER — Other Ambulatory Visit: Payer: Self-pay | Admitting: Cardiovascular Disease

## 2020-05-03 ENCOUNTER — Encounter: Payer: Self-pay | Admitting: Podiatry

## 2020-05-03 ENCOUNTER — Ambulatory Visit: Payer: Medicare PPO | Admitting: Podiatry

## 2020-05-03 ENCOUNTER — Other Ambulatory Visit: Payer: Self-pay

## 2020-05-03 VITALS — Temp 96.5°F

## 2020-05-03 DIAGNOSIS — L03032 Cellulitis of left toe: Secondary | ICD-10-CM | POA: Diagnosis not present

## 2020-05-03 DIAGNOSIS — E1142 Type 2 diabetes mellitus with diabetic polyneuropathy: Secondary | ICD-10-CM | POA: Diagnosis not present

## 2020-05-03 NOTE — Progress Notes (Signed)
This patient presents the office with chief complaint of drainage from under her left big toenail left foot.  She says that the drainage has been present for approximately 3 days.  She says she is not experiencing any pain or discomfort at the site of the nail left foot.  She denies any history of trauma or injury to the toenail big toe left foot.  She says she had a similar problem on her right big toe which was treated with removal of the right nail plate.  She says that toe has healed uneventfully she is not having any pain or discomfort.  She presents the office today for an evaluation of the drainage from under the left toenail left hallux.  General Appearance  Alert, conversant and in no acute stress.  Vascular  Dorsalis pedis and posterior tibial  pulses are weakly  palpable  bilaterally.  Capillary return is within normal limits  bilaterally. Temperature is within normal limits  bilaterally.  Neurologic  Senn-Weinstein monofilament wire test absent   bilaterally. Muscle power within normal limits bilaterally.  Nails Thick disfigured discolored nails with subungual debris  from hallux to fifth toes bilaterally.  The nail plate on the left hallux is unattached at the lateral aspect of the nail.  There is drainage noted subungually on the left hallux toenail.  There is no evidence of any redness or pus or infection noted upon examination of the left hallux.  Orthopedic  No limitations of motion  feet .  No crepitus or effusions noted.  No bony pathology or digital deformities noted.  Skin  normotropic skin with no porokeratosis noted bilaterally.  No signs of infections or ulcers noted.    Paronychia left hallux.    ROV.  Treatment options and alternatives discussed.  Recommended an incision and drainage and patient agreed. Left hallux was prepped with alcohol and a 3cc. of  2% lidocaine plain was administered in a digital block fashion.  The toe was then prepped with betadine solution .  The  offending nail  was then excised and all necrotic tissue was resected.  The area was then cleansed  and antibiotic ointment and a dry sterile dressing was applied.  The patient was dispensed instructions for aftercare.  Patient was told to call the office for follow-up evaluation if this nail is not healing properly.  Otherwise she is already scheduled for routine foot care and I can examine her surgical site at that time.  Helane Gunther DPM

## 2020-05-05 ENCOUNTER — Other Ambulatory Visit: Payer: Self-pay | Admitting: Cardiovascular Disease

## 2020-05-09 ENCOUNTER — Other Ambulatory Visit: Payer: Self-pay | Admitting: Cardiovascular Disease

## 2020-05-10 ENCOUNTER — Ambulatory Visit: Payer: Medicare PPO | Admitting: Podiatry

## 2020-05-10 ENCOUNTER — Telehealth: Payer: Self-pay | Admitting: *Deleted

## 2020-05-10 NOTE — Telephone Encounter (Signed)
Pt states she has a question concerning an ingrown removed 1 week ago.

## 2020-05-10 NOTE — Telephone Encounter (Signed)
I spoke with [name] and she states Dr. Stacie Acres had wanted her to use peroxide on the toe but she doesn't know for how long.

## 2020-05-11 NOTE — Telephone Encounter (Signed)
Please advise 

## 2020-05-11 NOTE — Telephone Encounter (Signed)
Patient should fii lthe top of peroxide bottle in the cap and then wash surgical site.

## 2020-05-12 NOTE — Telephone Encounter (Signed)
How should she use the peroxide

## 2020-05-20 ENCOUNTER — Encounter: Payer: Self-pay | Admitting: Cardiovascular Disease

## 2020-05-20 ENCOUNTER — Other Ambulatory Visit: Payer: Self-pay

## 2020-05-20 ENCOUNTER — Ambulatory Visit (INDEPENDENT_AMBULATORY_CARE_PROVIDER_SITE_OTHER): Payer: Medicare PPO | Admitting: Cardiovascular Disease

## 2020-05-20 VITALS — BP 130/68 | HR 65 | Ht 62.0 in | Wt 233.8 lb

## 2020-05-20 DIAGNOSIS — E782 Mixed hyperlipidemia: Secondary | ICD-10-CM

## 2020-05-20 DIAGNOSIS — I2510 Atherosclerotic heart disease of native coronary artery without angina pectoris: Secondary | ICD-10-CM

## 2020-05-20 DIAGNOSIS — I5042 Chronic combined systolic (congestive) and diastolic (congestive) heart failure: Secondary | ICD-10-CM | POA: Diagnosis not present

## 2020-05-20 DIAGNOSIS — I10 Essential (primary) hypertension: Secondary | ICD-10-CM

## 2020-05-20 NOTE — Assessment & Plan Note (Signed)
History of hyperlipidemia on statin therapy followed by her PCP. 

## 2020-05-20 NOTE — Progress Notes (Signed)
05/20/2020 [name]   03/13/1931  [id number]  Primary Physician Andi Devon, MD Primary Cardiologist: Runell Gess MD FACP, [place], [place], [place]  HPI:  [name] is a 86 y.o.   moderately overweight widowed African American female, mother of 42, grandmother to 86 grandchildren,whoI saw herin the office  05/15/2019.  Her daughter [name] works at the front desk in the [employer] and admitting at [facility]... She has a history of probable nonischemic cardiomyopathy with moderate LV dysfunction by 2D echocardiogram, last checked June 03, 2012, with an EF of 35% to 40%. She was catheterized by Dr. Daphene Jaeger, June 06, 2012, revealing a similar EF with no significant CAD, but this was in the setting of sepsis, requiring intubation, and congestive heart failure, responding to antibiotics and diuresis. Her other problems include hypertension, hyperlipidemia, and diabetes. She is otherwise asymptomatic. Since I saw her one year ago she's remained clinically stable. She has had some venous stasis ulcers on her legs followed by Dr. Shella Spearing at the [facility] wound care clinic. These ulcers have since healed and she wears compression stockings.She had a bladder tack operation for prolapsed bladder at [facility] on October 12,2017.   Since I saw her in the office a year ago she continues to do well.  She has not contracted COVID-19.  She walks with a walker at home.  She denies chest pain or shortness of breath.   No outpatient medications have been marked as taking for the 05/20/20 encounter (Office Visit) with Runell Gess, MD.     Allergies  Allergen Reactions  . Lisinopril Other (See Comments) and Cough    cough cough    Social History   Socioeconomic History  . Marital status: Widowed    Spouse name: Not on file  . Number of children: 6  . Years of education: Not on file  . Highest education level: Not on  file  Occupational History  . Not on file  Tobacco Use  . Smoking status: Never Smoker  . Smokeless tobacco: Never Used  Substance and Sexual Activity  . Alcohol use: No    Comment: 11/30/11 "used to drink; not much; don't drink anymore"  . Drug use: No  . Sexual activity: Never  Other Topics Concern  . Not on file  Social History Narrative  . Not on file   Social Determinants of Health   Financial Resource Strain:   . Difficulty of Paying Living Expenses:   Food Insecurity:   . Worried About Programme researcher, broadcasting/film/video in the Last Year:   . Barista in the Last Year:   Transportation Needs:   . Freight forwarder (Medical):   Marland Kitchen Lack of Transportation (Non-Medical):   Physical Activity:   . Days of Exercise per Week:   . Minutes of Exercise per Session:   Stress:   . Feeling of Stress :   Social Connections:   . Frequency of Communication with Friends and Family:   . Frequency of Social Gatherings with Friends and Family:   . Attends Religious Services:   . Active Member of Clubs or Organizations:   . Attends Banker Meetings:   Marland Kitchen Marital Status:   Intimate Partner Violence:   . Fear of Current or Ex-Partner:   . Emotionally Abused:   Marland Kitchen Physically Abused:   . Sexually Abused:      Review of Systems:  General: negative for chills, fever, night sweats or weight changes.  Cardiovascular: negative for chest pain, dyspnea on exertion, edema, orthopnea, palpitations, paroxysmal nocturnal dyspnea or shortness of breath Dermatological: negative for rash Respiratory: negative for cough or wheezing Urologic: negative for hematuria Abdominal: negative for nausea, vomiting, diarrhea, bright red blood per rectum, melena, or hematemesis Neurologic: negative for visual changes, syncope, or dizziness All other systems reviewed and are otherwise negative except as noted above.    Blood pressure 130/68, pulse 65, height 5\' 2"  (1.575 m), weight 233 lb 12.8 oz  (106.1 kg).  General appearance: alert and no distress Neck: no adenopathy, no carotid bruit, no JVD, supple, symmetrical, trachea midline and thyroid not enlarged, symmetric, no tenderness/mass/nodules Lungs: clear to auscultation bilaterally Heart: regular rate and rhythm, S1, S2 normal, no murmur, click, rub or gallop Extremities: extremities normal, atraumatic, no cyanosis or edema Pulses: 2+ and symmetric Skin: Skin color, texture, turgor normal. No rashes or lesions Neurologic: Alert and oriented X 3, normal strength and tone. Normal symmetric reflexes. Normal coordination and gait  EKG sinus rhythm at 65 with left anterior fascicular block.  I personally reviewed this EKG.  ASSESSMENT AND PLAN:   HTN (hypertension) History of essential hypertension with blood pressure measured today at 130/60.  She is on hydralazine, losartan and Toprol-XL.  CAD (coronary artery disease), mild, 20% cath 06/06/12 History of mild CAD by cath performed Dr. Claiborne Billings 06/06/2012.  Chronic combined systolic and diastolic heart failure (HCC) History of nonischemic cardiopathy with an EF initially of 35 to 40% at the time of cath 06/03/2012.  A more recent echo performed 11/09/2014 revealed normal LV systolic function with grade 1 diastolic dysfunction and mild mitral regurgitation.  She is on furosemide.  Hyperlipidemia History of hyperlipidemia on statin therapy followed by her PCP      Lorretta Harp MD [facility], [facility] 05/20/2020 9:04 AM

## 2020-05-20 NOTE — Patient Instructions (Signed)
Medication Instructions:  Your Physician recommend you continue on your current medication as directed.    *If you need a refill on your cardiac medications before your next appointment, please call your pharmacy*   Lab Work: None  Testing/Procedures: None   Follow-Up: At [facility], you and your health needs are our priority.  As part of our continuing mission to provide you with exceptional heart care, we have created designated Provider Care Teams.  These Care Teams include your primary Cardiologist (physician) and Advanced Practice Providers (APPs -  Physician Assistants and Nurse Practitioners) who all work together to provide you with the care you need, when you need it.  We recommend signing up for the patient portal called "MyChart".  Sign up information is provided on this After Visit Summary.  MyChart is used to connect with patients for Virtual Visits (Telemedicine).  Patients are able to view lab/test results, encounter notes, upcoming appointments, etc.  Non-urgent messages can be sent to your provider as well.   To learn more about what you can do with MyChart, go to [email or web address].    Your next appointment:   1 year(s)  The format for your next appointment:   In Person  Provider:   Jonathan Berry, MD     

## 2020-05-20 NOTE — Assessment & Plan Note (Addendum)
History of nonischemic cardiopathy with an EF initially of 35 to 40% at the time of cath 06/03/2012.  A more recent echo performed 11/09/2014 revealed normal LV systolic function with grade 1 diastolic dysfunction and mild mitral regurgitation.  She is on furosemide.

## 2020-05-20 NOTE — Assessment & Plan Note (Signed)
History of essential hypertension with blood pressure measured today at 130/60.  She is on hydralazine, losartan and Toprol-XL.

## 2020-05-20 NOTE — Assessment & Plan Note (Signed)
History of mild CAD by cath performed Dr. Tresa Endo 06/06/2012.

## 2020-05-27 ENCOUNTER — Telehealth: Payer: Self-pay | Admitting: Cardiovascular Disease

## 2020-05-27 ENCOUNTER — Telehealth: Payer: Self-pay

## 2020-05-27 NOTE — Telephone Encounter (Signed)
     Lisa nurse case manager from [employer], she said pt switch insurance and the pt needs weighing scale and pulse oximeter to monitor her CHF. She said to call pt to discuss

## 2020-05-27 NOTE — Telephone Encounter (Signed)
Patient has been contacted by the case worker team.

## 2020-05-27 NOTE — Telephone Encounter (Signed)
Called patient to discuss need for medical supplies after a Metallurgist called from [place]. Patient was formerly covered under [employer] and they collected their weight scale, bp machine, and the pulse oximeter.  Patient is in need of all three and will connect with social worker to see if we can get these ordered for the patient and mailed to their home.

## 2020-06-01 ENCOUNTER — Other Ambulatory Visit: Payer: Self-pay

## 2020-06-01 ENCOUNTER — Ambulatory Visit: Payer: Medicare PPO | Admitting: Podiatry

## 2020-06-01 ENCOUNTER — Encounter: Payer: Self-pay | Admitting: Podiatry

## 2020-06-01 DIAGNOSIS — M79675 Pain in left toe(s): Secondary | ICD-10-CM | POA: Diagnosis not present

## 2020-06-01 DIAGNOSIS — B351 Tinea unguium: Secondary | ICD-10-CM | POA: Diagnosis not present

## 2020-06-01 DIAGNOSIS — E1142 Type 2 diabetes mellitus with diabetic polyneuropathy: Secondary | ICD-10-CM

## 2020-06-01 DIAGNOSIS — M79674 Pain in right toe(s): Secondary | ICD-10-CM | POA: Diagnosis not present

## 2020-06-01 NOTE — Progress Notes (Signed)
This patient returns to my office for at risk foot care.  This patient requires this care by a professional since this patient will be at risk due to having diabetes.   This patient is unable to cut nails herself since the patient cannot reach her nails.These nails are painful walking and wearing shoes.  This patient presents for at risk foot care today. Her healing nail paronychia has resolved and has healed.  General Appearance  Alert, conversant and in no acute stress.  Vascular  Dorsalis pedis and posterior tibial  pulses are palpable  bilaterally.  Capillary return is within normal limits  bilaterally. Temperature is within normal limits  bilaterally.  Neurologic  Senn-Weinstein monofilament wire test absent   bilaterally. Muscle power within normal limits bilaterally.  Nails Thick disfigured discolored nails with subungual debris  from hallux to fifth toes bilaterally. No evidence of bacterial infection or drainage bilaterally. Healed left hallux.  Orthopedic  No limitations of motion  feet .  No crepitus or effusions noted.  No bony pathology or digital deformities noted.  Skin  normotropic skin with no porokeratosis noted bilaterally.  No signs of infections or ulcers noted.   Callus right forefoot asymptomatic.  Onychomycosis  Pain in right toes  Pain in left toes  Porokeratosis  Right foot.  Consent was obtained for treatment procedures.   Mechanical debridement of nails 1-5  bilaterally performed with a nail nipper.  Filed with dremel without incident. Debridemed the callus with # 15 blade.   Return office visit 3 months                    Told patient to return for periodic foot care and evaluation due to potential at risk complications.   Helane Gunther DPM

## 2020-06-03 ENCOUNTER — Telehealth: Payer: Self-pay

## 2020-06-03 NOTE — Telephone Encounter (Signed)
Called patient to inform her that her medical supplies (weight scale, BP monitor, and pulse oximeter) was being ordered today. Let message for patient to call with any questions or concerns. [phone]

## 2020-06-07 ENCOUNTER — Telehealth: Payer: Self-pay | Admitting: Licensed Clinical Social Worker

## 2020-06-07 NOTE — Telephone Encounter (Signed)
CSW referred to assist patient with obtaining a BP cuff, scale and pulse ox. CSW contacted patient to inform cuff, scale and pulse ox will be delivered to home. Patient grateful for support and assistance. CSW available as needed. Lasandra Beech, LCSW, CCSW-MCS [phone]

## 2020-06-09 ENCOUNTER — Other Ambulatory Visit: Payer: Self-pay | Admitting: Cardiovascular Disease

## 2020-06-14 NOTE — Telephone Encounter (Signed)
Follow Up  Nurse from [facility] is calling because blood pressure cuff not working.

## 2020-06-23 NOTE — Telephone Encounter (Signed)
Patient is calling to follow up regarding BP cuff. She states it is still not working.

## 2020-06-27 ENCOUNTER — Telehealth: Payer: Self-pay

## 2020-06-27 ENCOUNTER — Telehealth: Payer: Self-pay | Admitting: Licensed Clinical Social Worker

## 2020-06-27 NOTE — Telephone Encounter (Signed)
Called patient to follow up regarding the blood pressure cuff ordered for her is not working. Patient stated that new batteries have been installed and it still doesn't work. Will collaborate with LSCW to determine next steps.

## 2020-06-27 NOTE — Telephone Encounter (Signed)
Opened in error

## 2020-06-30 ENCOUNTER — Telehealth: Payer: Self-pay

## 2020-06-30 NOTE — Telephone Encounter (Signed)
Patient called to schedule a time that she can bring in her blood pressure cuff to be checked out or to be replaced if not working. Patient has been advised of appointment date.

## 2020-07-06 ENCOUNTER — Ambulatory Visit: Payer: Medicare PPO

## 2020-07-11 ENCOUNTER — Other Ambulatory Visit: Payer: Self-pay | Admitting: Cardiovascular Disease

## 2020-07-19 ENCOUNTER — Other Ambulatory Visit: Payer: Self-pay | Admitting: Cardiovascular Disease

## 2020-07-21 ENCOUNTER — Other Ambulatory Visit: Payer: Self-pay | Admitting: Cardiovascular Disease

## 2020-08-09 ENCOUNTER — Other Ambulatory Visit: Payer: Self-pay | Admitting: Cardiovascular Disease

## 2020-09-02 ENCOUNTER — Encounter: Payer: Self-pay | Admitting: Podiatry

## 2020-09-02 ENCOUNTER — Other Ambulatory Visit: Payer: Self-pay

## 2020-09-02 ENCOUNTER — Ambulatory Visit (INDEPENDENT_AMBULATORY_CARE_PROVIDER_SITE_OTHER): Payer: Medicare PPO | Admitting: Podiatry

## 2020-09-02 DIAGNOSIS — M79675 Pain in left toe(s): Secondary | ICD-10-CM

## 2020-09-02 DIAGNOSIS — M79674 Pain in right toe(s): Secondary | ICD-10-CM | POA: Diagnosis not present

## 2020-09-02 DIAGNOSIS — E1142 Type 2 diabetes mellitus with diabetic polyneuropathy: Secondary | ICD-10-CM | POA: Diagnosis not present

## 2020-09-02 DIAGNOSIS — B351 Tinea unguium: Secondary | ICD-10-CM

## 2020-09-02 NOTE — Progress Notes (Signed)
This patient returns to my office for at risk foot care.  This patient requires this care by a professional since this patient will be at risk due to having diabetes.   This patient is unable to cut nails herself since the patient cannot reach her nails.These nails are painful walking and wearing shoes.  This patient presents for at risk foot care today. Her healing nail paronychia has resolved and has healed.  General Appearance  Alert, conversant and in no acute stress.  Vascular  Dorsalis pedis and posterior tibial  pulses are palpable  bilaterally.  Capillary return is within normal limits  bilaterally. Temperature is within normal limits  bilaterally.  Neurologic  Senn-Weinstein monofilament wire test absent   bilaterally. Muscle power within normal limits bilaterally.  Nails Thick disfigured discolored nails with subungual debris  from hallux to fifth toes bilaterally. No evidence of bacterial infection or drainage bilaterally. Healed left hallux.  Orthopedic  No limitations of motion  feet .  No crepitus or effusions noted.  No bony pathology or digital deformities noted.  Skin  normotropic skin with no porokeratosis noted bilaterally.  No signs of infections or ulcers noted.   Callus right forefoot asymptomatic.  Onychomycosis  Pain in right toes  Pain in left toes  Porokeratosis  Right foot.  Consent was obtained for treatment procedures.   Mechanical debridement of nails 1-5  bilaterally performed with a nail nipper.  Filed with dremel without incident. Debridemed the callus with # 15 blade.   Return office visit 3 months                    Told patient to return for periodic foot care and evaluation due to potential at risk complications.   Helane Gunther DPM

## 2020-09-19 ENCOUNTER — Ambulatory Visit: Payer: Medicare PPO | Attending: Internal Medicine

## 2020-09-19 DIAGNOSIS — Z23 Encounter for immunization: Secondary | ICD-10-CM

## 2020-09-19 NOTE — Progress Notes (Signed)
   Covid-19 Vaccination Clinic  Name:  [name]    MRN: [id number] DOB: [birth year]  09/19/2020  [name] was observed post Covid-19 immunization for 15 minutes without incident. She was provided with Vaccine Information Sheet and instruction to access the V-Safe system.   [name] was instructed to call 911 with any severe reactions post vaccine: Marland Kitchen Difficulty breathing  . Swelling of face and throat  . A fast heartbeat  . A bad rash all over body  . Dizziness and weakness

## 2020-11-04 ENCOUNTER — Other Ambulatory Visit: Payer: Self-pay | Admitting: Cardiovascular Disease

## 2020-12-07 ENCOUNTER — Ambulatory Visit: Payer: Medicare PPO | Admitting: Podiatry

## 2020-12-28 ENCOUNTER — Other Ambulatory Visit: Payer: Self-pay | Admitting: Cardiovascular Disease

## 2021-01-31 ENCOUNTER — Other Ambulatory Visit: Payer: Self-pay | Admitting: Cardiovascular Disease

## 2021-04-20 ENCOUNTER — Other Ambulatory Visit: Payer: Self-pay | Admitting: Cardiovascular Disease

## 2021-05-15 ENCOUNTER — Other Ambulatory Visit: Payer: Self-pay | Admitting: Cardiovascular Disease

## 2021-05-24 ENCOUNTER — Ambulatory Visit: Payer: Medicare PPO | Admitting: Cardiovascular Disease

## 2021-06-12 ENCOUNTER — Other Ambulatory Visit: Payer: Self-pay | Admitting: Cardiovascular Disease

## 2021-06-20 ENCOUNTER — Ambulatory Visit: Payer: Medicare PPO | Admitting: Cardiovascular Disease

## 2021-06-23 ENCOUNTER — Ambulatory Visit: Payer: Medicare PPO | Admitting: Cardiovascular Disease

## 2021-06-23 ENCOUNTER — Other Ambulatory Visit: Payer: Self-pay

## 2021-06-23 ENCOUNTER — Encounter: Payer: Self-pay | Admitting: Cardiovascular Disease

## 2021-06-23 VITALS — BP 142/69 | HR 63 | Ht 62.0 in | Wt 223.8 lb

## 2021-06-23 DIAGNOSIS — I5042 Chronic combined systolic (congestive) and diastolic (congestive) heart failure: Secondary | ICD-10-CM

## 2021-06-23 DIAGNOSIS — I2510 Atherosclerotic heart disease of native coronary artery without angina pectoris: Secondary | ICD-10-CM | POA: Diagnosis not present

## 2021-06-23 DIAGNOSIS — I10 Essential (primary) hypertension: Secondary | ICD-10-CM | POA: Diagnosis not present

## 2021-06-23 DIAGNOSIS — E782 Mixed hyperlipidemia: Secondary | ICD-10-CM

## 2021-06-23 DIAGNOSIS — R600 Localized edema: Secondary | ICD-10-CM | POA: Diagnosis not present

## 2021-06-23 NOTE — Patient Instructions (Signed)
Medication Instructions:  No Changes In Medications at this time.  *If you need a refill on your cardiac medications before your next appointment, please call your pharmacy*  Follow-Up: At [facility], you and your health needs are our priority.  As part of our continuing mission to provide you with exceptional heart care, we have created designated Provider Care Teams.  These Care Teams include your primary Cardiologist (physician) and Advanced Practice Providers (APPs -  Physician Assistants and Nurse Practitioners) who all work together to provide you with the care you need, when you need it.  We recommend signing up for the patient portal called "MyChart".  Sign up information is provided on this After Visit Summary.  MyChart is used to connect with patients for Virtual Visits (Telemedicine).  Patients are able to view lab/test results, encounter notes, upcoming appointments, etc.  Non-urgent messages can be sent to your provider as well.   To learn more about what you can do with MyChart, go to [email or web address].    Your next appointment:   1 year(s)  The format for your next appointment:   In Person  Provider:   Jonathan Berry, MD 

## 2021-06-23 NOTE — Assessment & Plan Note (Signed)
History of minimal nonobstructive CAD by cath 06/06/2012.  She denies chest pain.

## 2021-06-23 NOTE — Assessment & Plan Note (Signed)
History of hyperlipidemia on statin therapy followed by her PCP. 

## 2021-06-23 NOTE — Progress Notes (Signed)
06/23/2021 [name]   Oct 07, 1931  [id number]  Primary Physician Willey Blade, MD Primary Cardiologist: Lorretta Harp MD FACP, [place], [place], [place]  HPI:  [name] is a 87 y.o.  moderately overweight widowed African American female, mother of 73, grandmother to 69 grandchildren, who  I saw her in the office    05/20/2020.  Her daughter [name] works at the front desk in the [employer] and admitting at [facility]... She has a history of probable nonischemic cardiomyopathy with moderate LV dysfunction by 2D echocardiogram, last checked June 03, 2012, with an EF of 35% to 40%. She was catheterized by Dr. Ellouise Newer, June 06, 2012, revealing a similar EF with no significant CAD, but this was in the setting of sepsis, requiring intubation, and congestive heart failure, responding to antibiotics and diuresis. Her other problems include hypertension, hyperlipidemia, and diabetes.  She is otherwise asymptomatic. Since I saw her one year ago she's remained clinically stable. She has had some venous stasis ulcers on her legs followed by Dr. Steele Sizer at the [facility] wound care clinic. These ulcers have since healed and she wears compression stockings. She had a bladder tack operation for prolapsed bladder at [facility] on October 12 , 2017.     Since I saw her in the office a year ago she continues to do well.  She continues to take furosemide for lower extremity edema which she increases when necessary.  She wears compression stockings.  She walks with a walker.  She denies chest pain or shortness of breath.   Current Meds  Medication Sig   ACCU-CHEK AVIVA PLUS test strip    ACCU-CHEK SOFTCLIX LANCETS lancets    acidophilus (RISAQUAD) CAPS Take 1 capsule by mouth daily.   amLODipine (NORVASC) 10 MG tablet    aspirin 81 MG chewable tablet Chew 81 mg by mouth daily.    benzonatate (TESSALON) 100 MG capsule    bimatoprost (LUMIGAN) 0.01 %  SOLN Lumigan 0.01 % eye drops   Blood Glucose Monitoring Suppl (ACCU-CHEK AVIVA PLUS) W/DEVICE KIT    calcium-vitamin D (OSCAL WITH D) 500-200 MG-UNIT per tablet Take 1 tablet by mouth daily.   Coenzyme Q10 (CO Q-10) 200 MG CAPS Take 200 mg by mouth daily.   FeFum-FePoly-FA-B Cmp-C-Biot (INTEGRA PLUS) CAPS daily.    furosemide (LASIX) 20 MG tablet TAKE 1 TABLET BY MOUTH EVERY DAY   guaiFENesin-dextromethorphan (ROBITUSSIN DM) 100-10 MG/5ML syrup Take 5 mLs by mouth every 4 (four) hours as needed for cough.   hydrALAZINE (APRESOLINE) 50 MG tablet Take 1.5 tablets (75 mg total) by mouth 2 (two) times daily.   metoprolol succinate (TOPROL-XL) 25 MG 24 hr tablet Take 0.5 tablets (12.5 mg total) by mouth daily. Take with or immediately following a meal.   Multiple Vitamin (MULTI-VITAMIN PO) Take 1 tablet by mouth daily.   MYRBETRIQ 25 MG TB24 tablet TAKE 1 TABLET(S) EVERY DAY BY ORAL ROUTE.   nitroGLYCERIN (NITROSTAT) 0.4 MG SL tablet DISSOLVE 1 TABLET UNDER THE TONGUE AS NEEDED FOR CHEST PAIN. IF PAIN IS NOT RELIEVED AFTER 1 DOSE THEN CALL FOR MEDICAL ASSISTANCE, AND REPEAT AS NEEDED EVERY 5 MINUTES UP TO A TOTAL OF 3 DOSES.   olmesartan (BENICAR) 40 MG tablet    omega-3 acid ethyl esters (LOVAZA) 1 G capsule Take 2 g by mouth daily.   omeprazole (PRILOSEC) 40 MG capsule Take 40 mg by mouth 2 (two) times daily.  potassium chloride SA (KLOR-CON) 20 MEQ tablet TAKE 1 TABLET BY MOUTH DAILY   simvastatin (ZOCOR) 10 MG tablet    simvastatin (ZOCOR) 20 MG tablet Take 20 mg by mouth every evening.   [DISCONTINUED] losartan (COZAAR) 100 MG tablet TAKE 1 TABLET (100 MG TOTAL) BY MOUTH DAILY.     Allergies  Allergen Reactions   Lisinopril Other (See Comments) and Cough    cough cough    Social History   Socioeconomic History   Marital status: Widowed    Spouse name: Not on file   Number of children: 6   Years of education: Not on file   Highest education level: Not on file  Occupational  History   Not on file  Tobacco Use   Smoking status: Never   Smokeless tobacco: Never  Substance and Sexual Activity   Alcohol use: No    Comment: 11/30/11 "used to drink; not much; don't drink anymore"   Drug use: No   Sexual activity: Never  Other Topics Concern   Not on file  Social History Narrative   Not on file   Social Determinants of Health   Financial Resource Strain: Not on file  Food Insecurity: Not on file  Transportation Needs: Not on file  Physical Activity: Not on file  Stress: Not on file  Social Connections: Not on file  Intimate Partner Violence: Not on file     Review of Systems: General: negative for chills, fever, night sweats or weight changes.  Cardiovascular: negative for chest pain, dyspnea on exertion, edema, orthopnea, palpitations, paroxysmal nocturnal dyspnea or shortness of breath Dermatological: negative for rash Respiratory: negative for cough or wheezing Urologic: negative for hematuria Abdominal: negative for nausea, vomiting, diarrhea, bright red blood per rectum, melena, or hematemesis Neurologic: negative for visual changes, syncope, or dizziness All other systems reviewed and are otherwise negative except as noted above.    Blood pressure (!) 142/69, pulse 63, height '5\' 2"'  (1.575 m), weight 223 lb 12.8 oz (101.5 kg), SpO2 99 %.  General appearance: alert and no distress Neck: no adenopathy, no carotid bruit, no JVD, supple, symmetrical, trachea midline, and thyroid not enlarged, symmetric, no tenderness/mass/nodules Lungs: clear to auscultation bilaterally Heart: regular rate and rhythm, S1, S2 normal, no murmur, click, rub or gallop Extremities: Trace to 1+ bilateral lower extremity edema. Pulses: 2+ and symmetric Skin: Skin color, texture, turgor normal. No rashes or lesions Neurologic: Grossly normal  EKG sinus rhythm at 63 with septal Q waves, left axis deviation and low limb voltage.  I personally reviewed this  EKG.  ASSESSMENT AND PLAN:   HTN (hypertension) History of essential hypertension a blood pressure measured today 142/69.  She is on amlodipine, olmesartan and metoprolol.    CAD (coronary artery disease), mild, 20% cath 06/06/12 History of minimal nonobstructive CAD by cath 06/06/2012.  She denies chest pain.  Chronic combined systolic and diastolic heart failure (HCC) Last 2D echo performed 11/09/2014 revealed normal LV systolic function, grade 1 diastolic dysfunction and mild MR.  Bilateral lower extremity edema Bilateral lower extremity edema which is 1+ today but likely related to diastolic dysfunction on oral diuretics.  She has had venous ulcers in the past which have resolved.  Hyperlipidemia History of hyperlipidemia on statin therapy followed by her PCP     Lorretta Harp MD [facility], [facility] 06/23/2021 9:12 AM

## 2021-06-23 NOTE — Assessment & Plan Note (Signed)
Last 2D echo performed 11/09/2014 revealed normal LV systolic function, grade 1 diastolic dysfunction and mild MR.

## 2021-06-23 NOTE — Assessment & Plan Note (Signed)
Bilateral lower extremity edema which is 1+ today but likely related to diastolic dysfunction on oral diuretics.  She has had venous ulcers in the past which have resolved.

## 2021-06-23 NOTE — Assessment & Plan Note (Addendum)
History of essential hypertension a blood pressure measured today 142/69.  She is on amlodipine, olmesartan and metoprolol.

## 2021-07-05 ENCOUNTER — Ambulatory Visit: Payer: Medicare PPO | Admitting: Podiatry

## 2021-07-05 ENCOUNTER — Encounter: Payer: Self-pay | Admitting: Podiatry

## 2021-07-05 ENCOUNTER — Other Ambulatory Visit: Payer: Self-pay

## 2021-07-05 DIAGNOSIS — M79675 Pain in left toe(s): Secondary | ICD-10-CM

## 2021-07-05 DIAGNOSIS — B351 Tinea unguium: Secondary | ICD-10-CM | POA: Diagnosis not present

## 2021-07-05 DIAGNOSIS — M79674 Pain in right toe(s): Secondary | ICD-10-CM

## 2021-07-05 DIAGNOSIS — M2010 Hallux valgus (acquired), unspecified foot: Secondary | ICD-10-CM

## 2021-07-05 DIAGNOSIS — E1142 Type 2 diabetes mellitus with diabetic polyneuropathy: Secondary | ICD-10-CM

## 2021-07-05 NOTE — Progress Notes (Signed)
This patient returns to my office for at risk foot care.  This patient requires this care by a professional since this patient will be at risk due to having diabetes.   This patient is unable to cut nails herself since the patient cannot reach her nails.These nails are painful walking and wearing shoes.  This patient presents for at risk foot care today.   General Appearance  Alert, conversant and in no acute stress.  Vascular  Dorsalis pedis and posterior tibial  pulses are palpable  bilaterally.  Capillary return is within normal limits  bilaterally. Temperature is within normal limits  bilaterally.  Neurologic  Senn-Weinstein monofilament wire test absent   bilaterally. Muscle power within normal limits bilaterally.  Nails Thick disfigured discolored nails with subungual debris  from hallux to fifth toes bilaterally. No evidence of bacterial infection or drainage bilaterally. Healed left hallux.  Orthopedic  No limitations of motion  feet .  No crepitus or effusions noted.  No bony pathology or digital deformities noted.  Skin  normotropic skin with no porokeratosis noted bilaterally.  No signs of infections or ulcers noted.    Onychomycosis  Pain in right toes  Pain in left toes   Consent was obtained for treatment procedures.   Mechanical debridement of nails 1-5  bilaterally performed with a nail nipper.  Filed with dremel without incident.    Return office visit 3 months                    Told patient to return for periodic foot care and evaluation due to potential at risk complications.   Helane Gunther DPM

## 2021-08-08 ENCOUNTER — Other Ambulatory Visit: Payer: Self-pay | Admitting: Cardiovascular Disease

## 2021-09-04 ENCOUNTER — Other Ambulatory Visit: Payer: Self-pay | Admitting: Cardiovascular Disease

## 2021-10-09 ENCOUNTER — Other Ambulatory Visit: Payer: Self-pay | Admitting: Cardiovascular Disease

## 2021-10-11 ENCOUNTER — Other Ambulatory Visit: Payer: Self-pay

## 2021-10-11 ENCOUNTER — Encounter: Payer: Self-pay | Admitting: Podiatry

## 2021-10-11 ENCOUNTER — Ambulatory Visit: Payer: Medicare PPO | Admitting: Podiatry

## 2021-10-11 DIAGNOSIS — B351 Tinea unguium: Secondary | ICD-10-CM

## 2021-10-11 DIAGNOSIS — M79675 Pain in left toe(s): Secondary | ICD-10-CM | POA: Diagnosis not present

## 2021-10-11 DIAGNOSIS — Q828 Other specified congenital malformations of skin: Secondary | ICD-10-CM

## 2021-10-11 DIAGNOSIS — M79674 Pain in right toe(s): Secondary | ICD-10-CM

## 2021-10-11 DIAGNOSIS — E1142 Type 2 diabetes mellitus with diabetic polyneuropathy: Secondary | ICD-10-CM

## 2021-10-11 NOTE — Progress Notes (Signed)
This patient returns to my office for at risk foot care.  This patient requires this care by a professional since this patient will be at risk due to having diabetes.   This patient is unable to cut nails herself since the patient cannot reach her nails.These nails are painful walking and wearing shoes.  This patient presents for at risk foot care today.  ° °General Appearance  Alert, conversant and in no acute stress. ° °Vascular  Dorsalis pedis and posterior tibial  pulses are palpable  bilaterally.  Capillary return is within normal limits  bilaterally. Temperature is within normal limits  bilaterally. ° °Neurologic  Senn-Weinstein monofilament wire test absent   bilaterally. Muscle power within normal limits bilaterally. ° °Nails Thick disfigured discolored nails with subungual debris  from hallux to fifth toes bilaterally. No evidence of bacterial infection or drainage bilaterally. Healed left hallux. ° °Orthopedic  No limitations of motion  feet .  No crepitus or effusions noted.  No bony pathology or digital deformities noted. ° °Skin  normotropic skin noted bilaterally.  No signs of infections or ulcers noted.  Symptomatic porokeratosis sub 5th met right foot. ° °Onychomycosis  Pain in right toes  Pain in left toes Porokeratosis right foot. ° °Consent was obtained for treatment procedures.   Mechanical debridement of nails 1-5  bilaterally performed with a nail nipper.  Filed with dremel without incident.  Debride porokeratosis sub 5th right foot  with # 15 blade. ° ° °Return office visit 3 months                    Told patient to return for periodic foot care and evaluation due to potential at risk complications. ° ° °Merinda Victorino DPM  °

## 2021-12-28 ENCOUNTER — Other Ambulatory Visit: Payer: Self-pay | Admitting: Cardiovascular Disease

## 2022-01-17 ENCOUNTER — Encounter: Payer: Self-pay | Admitting: Podiatry

## 2022-01-17 ENCOUNTER — Ambulatory Visit: Payer: Medicare PPO | Admitting: Podiatry

## 2022-01-17 ENCOUNTER — Other Ambulatory Visit: Payer: Self-pay

## 2022-01-17 DIAGNOSIS — B351 Tinea unguium: Secondary | ICD-10-CM | POA: Diagnosis not present

## 2022-01-17 DIAGNOSIS — M79675 Pain in left toe(s): Secondary | ICD-10-CM | POA: Diagnosis not present

## 2022-01-17 DIAGNOSIS — M79674 Pain in right toe(s): Secondary | ICD-10-CM | POA: Diagnosis not present

## 2022-01-17 DIAGNOSIS — E1142 Type 2 diabetes mellitus with diabetic polyneuropathy: Secondary | ICD-10-CM | POA: Diagnosis not present

## 2022-01-17 DIAGNOSIS — Q828 Other specified congenital malformations of skin: Secondary | ICD-10-CM

## 2022-01-17 DIAGNOSIS — M2010 Hallux valgus (acquired), unspecified foot: Secondary | ICD-10-CM

## 2022-01-17 NOTE — Progress Notes (Signed)
This patient returns to my office for at risk foot care.  This patient requires this care by a professional since this patient will be at risk due to having diabetes.   This patient is unable to cut nails herself since the patient cannot reach her nails.These nails are painful walking and wearing shoes.  This patient presents for at risk foot care today.   General Appearance  Alert, conversant and in no acute stress.  Vascular  Dorsalis pedis and posterior tibial  pulses are palpable  bilaterally.  Capillary return is within normal limits  bilaterally. Temperature is within normal limits  bilaterally.  Neurologic  Senn-Weinstein monofilament wire test absent   bilaterally. Muscle power within normal limits bilaterally.  Nails Thick disfigured discolored nails with subungual debris  from hallux to fifth toes bilaterally. No evidence of bacterial infection or drainage bilaterally. Healed left hallux.  Orthopedic  No limitations of motion  feet .  No crepitus or effusions noted.  No bony pathology or digital deformities noted.  Skin  normotropic skin noted bilaterally.  No signs of infections or ulcers noted.  Symptomatic porokeratosis sub 5th met right foot.  Onychomycosis  Pain in right toes  Pain in left toes Porokeratosis right foot.  Consent was obtained for treatment procedures.   Mechanical debridement of nails 1-5  bilaterally performed with a nail nipper.  Filed with dremel without incident.  Debride porokeratosis sub 5th right foot  with # 15 blade.   Return office visit 3 months                    Told patient to return for periodic foot care and evaluation due to potential at risk complications.   Gardiner Barefoot DPM

## 2022-01-25 ENCOUNTER — Other Ambulatory Visit: Payer: Self-pay | Admitting: Cardiovascular Disease

## 2022-02-27 ENCOUNTER — Telehealth: Payer: Self-pay | Admitting: Cardiovascular Disease

## 2022-02-27 NOTE — Telephone Encounter (Signed)
?*  STAT* If patient is at the pharmacy, call can be transferred to refill team. ? ? ?1. Which medications need to be refilled? (please list name of each medication and dose if known) potassium chloride SA (KLOR-CON) 20 MEQ tablet ? ?2. Which pharmacy/location (including street and city if local pharmacy) is medication to be sent to? [facility] - [place], [place] - [address] AT [address] ? ?3. Do they need a 30 day or 90 day supply? 90   ?

## 2022-02-28 MED ORDER — POTASSIUM CHLORIDE CRYS ER 20 MEQ PO TBCR: 20.0000 meq | EXTENDED_RELEASE_TABLET | Freq: Every day | ORAL | 3 refills | Status: DC

## 2022-04-05 ENCOUNTER — Other Ambulatory Visit: Payer: Self-pay

## 2022-04-05 MED ORDER — NITROGLYCERIN 0.4 MG SL SUBL: SUBLINGUAL_TABLET | SUBLINGUAL | 2 refills | Status: DC

## 2022-04-16 ENCOUNTER — Ambulatory Visit: Payer: Medicare PPO | Admitting: Podiatry

## 2022-04-25 ENCOUNTER — Ambulatory Visit: Payer: Medicare PPO | Admitting: Podiatry

## 2022-04-25 ENCOUNTER — Encounter: Payer: Self-pay | Admitting: Podiatry

## 2022-04-25 DIAGNOSIS — M2010 Hallux valgus (acquired), unspecified foot: Secondary | ICD-10-CM

## 2022-04-25 DIAGNOSIS — B351 Tinea unguium: Secondary | ICD-10-CM | POA: Diagnosis not present

## 2022-04-25 DIAGNOSIS — E1142 Type 2 diabetes mellitus with diabetic polyneuropathy: Secondary | ICD-10-CM | POA: Diagnosis not present

## 2022-04-25 DIAGNOSIS — Q828 Other specified congenital malformations of skin: Secondary | ICD-10-CM | POA: Diagnosis not present

## 2022-04-25 DIAGNOSIS — M79674 Pain in right toe(s): Secondary | ICD-10-CM | POA: Diagnosis not present

## 2022-04-25 DIAGNOSIS — M79675 Pain in left toe(s): Secondary | ICD-10-CM | POA: Diagnosis not present

## 2022-04-25 NOTE — Progress Notes (Signed)
This patient returns to my office for at risk foot care.  This patient requires this care by a professional since this patient will be at risk due to having diabetes.   This patient is unable to cut nails herself since the patient cannot reach her nails.These nails are painful walking and wearing shoes.  This patient presents for at risk foot care today.   General Appearance  Alert, conversant and in no acute stress.  Vascular  Dorsalis pedis and posterior tibial  pulses are palpable  bilaterally.  Capillary return is within normal limits  bilaterally. Temperature is within normal limits  bilaterally.  Neurologic  Senn-Weinstein monofilament wire test absent   bilaterally. Muscle power within normal limits bilaterally.  Nails Thick disfigured discolored nails with subungual debris  from hallux to fifth toes bilaterally. No evidence of bacterial infection or drainage bilaterally. Healed left hallux.  Orthopedic  No limitations of motion  feet .  No crepitus or effusions noted.  No bony pathology or digital deformities noted.  Skin  normotropic skin noted bilaterally.  No signs of infections or ulcers noted.  Symptomatic porokeratosis sub 5th met right foot.  Onychomycosis  Pain in right toes  Pain in left toes Porokeratosis right foot.  Consent was obtained for treatment procedures.   Mechanical debridement of nails 1-5  bilaterally performed with a nail nipper.  Filed with dremel without incident.  Debride porokeratosis sub 5th right foot  with # 15 blade.  Home nurse evaluated her extremities and recommended a vascular study be performed.  To talk with ner doctor in June.   Return office visit 3 months                    Told patient to return for periodic foot care and evaluation due to potential at risk complications.   Gardiner Barefoot DPM

## 2022-04-27 ENCOUNTER — Telehealth: Payer: Self-pay | Admitting: Cardiology

## 2022-04-27 ENCOUNTER — Other Ambulatory Visit: Payer: Self-pay

## 2022-04-27 MED ORDER — FUROSEMIDE 20 MG PO TABS: 20.0000 mg | ORAL_TABLET | Freq: Every day | ORAL | 3 refills | Status: DC

## 2022-04-27 MED ORDER — POTASSIUM CHLORIDE CRYS ER 20 MEQ PO TBCR: 20.0000 meq | EXTENDED_RELEASE_TABLET | Freq: Every day | ORAL | 3 refills | Status: DC

## 2022-04-27 NOTE — Telephone Encounter (Signed)
*  STAT* If patient is at the pharmacy, call can be transferred to refill team.   1. Which medications need to be refilled? (please list name of each medication and dose if known) furosemide (LASIX) 20 MG tablet   2. Which pharmacy/location (including street and city if local pharmacy) is medication to be sent to? [facility] - [place], [place] - [address]   3. Do they need a 30 day or 90 day supply? 90  

## 2022-07-03 ENCOUNTER — Ambulatory Visit: Payer: Medicare PPO | Admitting: Cardiovascular Disease

## 2022-07-03 ENCOUNTER — Encounter: Payer: Self-pay | Admitting: Cardiovascular Disease

## 2022-07-03 VITALS — BP 148/66 | HR 69 | Ht 62.0 in | Wt 210.0 lb

## 2022-07-03 DIAGNOSIS — I10 Essential (primary) hypertension: Secondary | ICD-10-CM

## 2022-07-03 DIAGNOSIS — I2510 Atherosclerotic heart disease of native coronary artery without angina pectoris: Secondary | ICD-10-CM

## 2022-07-03 DIAGNOSIS — I519 Heart disease, unspecified: Secondary | ICD-10-CM

## 2022-07-03 NOTE — Patient Instructions (Signed)
Medication Instructions:  No Changes In Medications at this time.     *If you need a refill on your cardiac medications before your next appointment, please call your pharmacy*   Lab Work: None Ordered At This Time.   If you have labs (blood work) drawn today and your tests are completely normal, you will receive your results only by: MyChart Message (if you have MyChart) OR A paper copy in the mail If you have any lab test that is abnormal or we need to change your treatment, we will call you to review the results.   Testing/Procedures: None Ordered At This Time.     Follow-Up: At [facility], you and your health needs are our priority.  As part of our continuing mission to provide you with exceptional heart care, we have created designated Provider Care Teams.  These Care Teams include your primary Cardiologist (physician) and Advanced Practice Providers (APPs -  Physician Assistants and Nurse Practitioners) who all work together to provide you with the care you need, when you need it.     Your next appointment:   1 year(s)  The format for your next appointment:   In Person  Provider:  Dr. Allyson Sabal     Important Information About Sugar

## 2022-07-03 NOTE — Progress Notes (Signed)
07/03/2022 [name]   05/27/1931  [id number]  Primary Physician Willey Blade, MD Primary Cardiologist: Lorretta Harp MD FACP, [place], [place], [place]  HPI:  [name] is a 88 y.o.  moderately overweight widowed African American female, mother of 8, grandmother to 87 grandchildren, who  I saw her in the office  06/23/2021.  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 She has a history of probable nonischemic cardiomyopathy with moderate LV dysfunction by 2D echocardiogram, last checked June 03, 2012, with an EF of 35% to 40%. She was catheterized by Dr. Ellouise Newer, June 06, 2012, revealing a similar EF with no significant CAD, but this was in the setting of sepsis, requiring intubation, and congestive heart failure, responding to antibiotics and diuresis. Her other problems include hypertension, hyperlipidemia, and diabetes.  She is otherwise asymptomatic. Since I saw her one year ago she's remained clinically stable. She has had some venous stasis ulcers on her legs followed by Dr. Steele Sizer at the [facility] wound care clinic. These ulcers have since healed and she wears compression stockings. She had a bladder tack operation for prolapsed bladder at [facility] on October 12 , 2017.     Since I saw her in the office a year ago she continues to do well.  She continues to take furosemide for lower extremity edema which she increases when necessary.  She wears compression stockings.  She walks with a walker.  She denies chest pain or shortness of breath.     Current Meds  Medication Sig   ACCU-CHEK AVIVA PLUS test strip    ACCU-CHEK SOFTCLIX LANCETS lancets    acidophilus (RISAQUAD) CAPS Take 1 capsule by mouth daily.   ALPRAZolam (XANAX) 0.25 MG tablet Take 0.25 mg by mouth 2 (two) times daily as needed.   amLODipine (NORVASC) 10 MG tablet    aspirin 81 MG chewable tablet Chew 81 mg  by mouth daily.    benzonatate (TESSALON) 100 MG capsule    bimatoprost (LUMIGAN) 0.01 % SOLN Lumigan 0.01 % eye drops   Blood Glucose Monitoring Suppl (ACCU-CHEK AVIVA PLUS) W/DEVICE KIT    calcium-vitamin D (OSCAL WITH D) 500-200 MG-UNIT per tablet Take 1 tablet by mouth daily.   Coenzyme Q10 (CO Q-10) 200 MG CAPS Take 200 mg by mouth daily.   FeFum-FePoly-FA-B Cmp-C-Biot (INTEGRA PLUS) CAPS daily.    furosemide (LASIX) 20 MG tablet Take 1 tablet (20 mg total) by mouth daily.   guaiFENesin-dextromethorphan (ROBITUSSIN DM) 100-10 MG/5ML syrup Take 5 mLs by mouth every 4 (four) hours as needed for cough.   hydrALAZINE (APRESOLINE) 50 MG tablet Take 1.5 tablets (75 mg total) by mouth 2 (two) times daily.   metoprolol succinate (TOPROL-XL) 25 MG 24 hr tablet Take 0.5 tablets (12.5 mg total) by mouth daily. Take with or immediately following a meal.   Multiple Vitamin (MULTI-VITAMIN PO) Take 1 tablet by mouth daily.   nitroGLYCERIN (NITROSTAT) 0.4 MG SL tablet DISSOLVE 1 TABLET UNDER THE TONGUE AS NEEDED FOR CHEST PAIN. IF PAIN IS NOT RELIEVED AFTER 1 DOSE THEN CALL FOR MEDICAL ASSISTANCE, AND REPEAT AS NEEDED EVERY 5 MINUTES UP TO A TOTAL OF 3 DOSES.   olmesartan (BENICAR) 40 MG tablet    omega-3 acid ethyl esters (LOVAZA) 1 G capsule Take 2 g by mouth daily.   omeprazole (PRILOSEC) 40 MG capsule Take 40 mg by  mouth 2 (two) times daily.    potassium chloride SA (KLOR-CON M) 20 MEQ tablet Take 1 tablet (20 mEq total) by mouth daily.   simvastatin (ZOCOR) 10 MG tablet    traMADol (ULTRAM) 50 MG tablet Take 50 mg by mouth 2 (two) times daily as needed.     Allergies  Allergen Reactions   Lisinopril Other (See Comments) and Cough    cough cough    Social History   Socioeconomic History   Marital status: Widowed    Spouse name: Not on file   Number of children: 6   Years of education: Not on file   Highest education level: Not on file  Occupational History   Not on file  Tobacco Use    Smoking status: Never   Smokeless tobacco: Never  Substance and Sexual Activity   Alcohol use: No    Comment: 11/30/11 "used to drink; not much; don't drink anymore"   Drug use: No   Sexual activity: Never  Other Topics Concern   Not on file  Social History Narrative   Not on file   Social Determinants of Health   Financial Resource Strain: Not on file  Food Insecurity: Not on file  Transportation Needs: Not on file  Physical Activity: Not on file  Stress: Not on file  Social Connections: Not on file  Intimate Partner Violence: Not on file     Review of Systems: General: negative for chills, fever, night sweats or weight changes.  Cardiovascular: negative for chest pain, dyspnea on exertion, edema, orthopnea, palpitations, paroxysmal nocturnal dyspnea or shortness of breath Dermatological: negative for rash Respiratory: negative for cough or wheezing Urologic: negative for hematuria Abdominal: negative for nausea, vomiting, diarrhea, bright red blood per rectum, melena, or hematemesis Neurologic: negative for visual changes, syncope, or dizziness All other systems reviewed and are otherwise negative except as noted above.    Blood pressure (!) 148/66, pulse 69, height '5\' 2"'  (1.575 m), weight 210 lb (95.3 kg), SpO2 96 %.  General appearance: alert and no distress Neck: no adenopathy, no carotid bruit, no JVD, supple, symmetrical, trachea midline, and thyroid not enlarged, symmetric, no tenderness/mass/nodules Lungs: clear to auscultation bilaterally Heart: regular rate and rhythm, S1, S2 normal, no murmur, click, rub or gallop Extremities: extremities normal, atraumatic, no cyanosis or edema Pulses: 2+ and symmetric Skin: Skin color, texture, turgor normal. No rashes or lesions Neurologic: Grossly normal  EKG sinus rhythm at 69 with left anterior fascicular block and nonspecific ST and T wave changes with septal Q waves.  I personally reviewed this EKG.  ASSESSMENT AND  PLAN:   HTN (hypertension) History of essential hypertension blood pressure measured today at 148/66.  She is on amlodipine, hydralazine and metoprolol.  CAD (coronary artery disease), mild, 20% cath 06/06/12 History of nonobstructive CAD by cardiac catheterization performed by Dr. Claiborne Billings June 06, 2012.  She denies chest pain.  Diastolic dysfunction, left ventricle, grade 2 by echo 06/04/12 History of normal LV systolic function with grade 1 diastolic dysfunction by 2D echo in 2015.  She does have intermittent lower extremity edema which she takes as needed furosemide for.     Lorretta Harp MD FACP,FACC,FAHA, [facility] 07/03/2022 12:12 PM

## 2022-07-03 NOTE — Assessment & Plan Note (Signed)
History of normal LV systolic function with grade 1 diastolic dysfunction by 2D echo in 2015.  She does have intermittent lower extremity edema which she takes as needed furosemide for.

## 2022-07-03 NOTE — Assessment & Plan Note (Signed)
History of nonobstructive CAD by cardiac catheterization performed by Dr. Tresa Endo June 06, 2012.  She denies chest pain.

## 2022-07-03 NOTE — Assessment & Plan Note (Signed)
History of essential hypertension blood pressure measured today at 148/66.  She is on amlodipine, hydralazine and metoprolol.

## 2022-08-01 ENCOUNTER — Ambulatory Visit: Payer: Medicare PPO | Admitting: Podiatry

## 2022-10-17 ENCOUNTER — Encounter: Payer: Self-pay | Admitting: Podiatry

## 2022-10-17 ENCOUNTER — Ambulatory Visit: Payer: Medicare PPO | Admitting: Podiatry

## 2022-10-17 DIAGNOSIS — B351 Tinea unguium: Secondary | ICD-10-CM

## 2022-10-17 DIAGNOSIS — Q828 Other specified congenital malformations of skin: Secondary | ICD-10-CM | POA: Diagnosis not present

## 2022-10-17 DIAGNOSIS — M79674 Pain in right toe(s): Secondary | ICD-10-CM

## 2022-10-17 DIAGNOSIS — E1142 Type 2 diabetes mellitus with diabetic polyneuropathy: Secondary | ICD-10-CM

## 2022-10-17 DIAGNOSIS — M79675 Pain in left toe(s): Secondary | ICD-10-CM | POA: Diagnosis not present

## 2022-10-17 NOTE — Progress Notes (Signed)
This patient returns to my office for at risk foot care.  This patient requires this care by a professional since this patient will be at risk due to having diabetes.   This patient is unable to cut nails herself since the patient cannot reach her nails.These nails are painful walking and wearing shoes.  This patient presents for at risk foot care today.   General Appearance  Alert, conversant and in no acute stress.  Vascular  Dorsalis pedis and posterior tibial  pulses are  weakly palpable  bilaterally.  Capillary return is within normal limits  bilaterally. Temperature is within normal limits  bilaterally.  Neurologic  Senn-Weinstein monofilament wire test absent   bilaterally. Muscle power within normal limits bilaterally.  Nails Thick disfigured discolored nails with subungual debris  from hallux to fifth toes bilaterally. No evidence of bacterial infection or drainage bilaterally. Healed left hallux.  Orthopedic  No limitations of motion  feet .  No crepitus or effusions noted.  No bony pathology or digital deformities noted.  Skin  normotropic skin noted bilaterally.  No signs of infections or ulcers noted.  Symptomatic porokeratosis sub 5th met right foot.  Onychomycosis  Pain in right toes  Pain in left toes Porokeratosis right foot.  Consent was obtained for treatment procedures.   Mechanical debridement of nails 1-5  bilaterally performed with a nail nipper.  Filed with dremel without incident.  Debride porokeratosis sub 5th right foot  with # 15 blade.      Return office visit 3 months                    Told patient to return for periodic foot care and evaluation due to potential at risk complications.   Gardiner Barefoot DPM

## 2023-02-20 ENCOUNTER — Encounter: Payer: Self-pay | Admitting: Podiatry

## 2023-02-20 ENCOUNTER — Ambulatory Visit: Payer: Medicare PPO | Admitting: Podiatry

## 2023-02-20 DIAGNOSIS — M79674 Pain in right toe(s): Secondary | ICD-10-CM

## 2023-02-20 DIAGNOSIS — M79675 Pain in left toe(s): Secondary | ICD-10-CM

## 2023-02-20 DIAGNOSIS — E1142 Type 2 diabetes mellitus with diabetic polyneuropathy: Secondary | ICD-10-CM | POA: Diagnosis not present

## 2023-02-20 DIAGNOSIS — B351 Tinea unguium: Secondary | ICD-10-CM

## 2023-02-20 NOTE — Progress Notes (Signed)
This patient returns to my office for at risk foot care.  This patient requires this care by a professional since this patient will be at risk due to having diabetes.   This patient is unable to cut nails herself since the patient cannot reach her nails.These nails are painful walking and wearing shoes.  This patient presents for at risk foot care today.   General Appearance  Alert, conversant and in no acute stress.  Vascular  Dorsalis pedis and posterior tibial  pulses are  weakly palpable  bilaterally.  Capillary return is within normal limits  bilaterally. Temperature is within normal limits  bilaterally.  Neurologic  Senn-Weinstein monofilament wire test absent   bilaterally. Muscle power within normal limits bilaterally.  Nails Thick disfigured discolored nails with subungual debris  from hallux to fifth toes bilaterally. No evidence of bacterial infection or drainage bilaterally. Healed left hallux.  Orthopedic  No limitations of motion  feet .  No crepitus or effusions noted.  No bony pathology or digital deformities noted.  Skin  normotropic skin noted bilaterally.  No signs of infections or ulcers noted.  Asymptomatic porokeratosis sub 5th met right foot.  Onychomycosis  Pain in right toes  Pain in left toes Porokeratosis right foot.  Consent was obtained for treatment procedures.   Mechanical debridement of nails 1-5  bilaterally performed with a nail nipper.  Filed with dremel without incident.     Return office visit 3 months                    Told patient to return for periodic foot care and evaluation due to potential at risk complications.   Gardiner Barefoot DPM

## 2023-04-20 ENCOUNTER — Other Ambulatory Visit: Payer: Self-pay | Admitting: Cardiovascular Disease

## 2023-06-24 ENCOUNTER — Ambulatory Visit: Payer: Medicare PPO | Admitting: Podiatry

## 2023-07-02 ENCOUNTER — Encounter (HOSPITAL_BASED_OUTPATIENT_CLINIC_OR_DEPARTMENT_OTHER): Payer: Medicare PPO | Attending: Internal Medicine | Admitting: Internal Medicine

## 2023-07-02 DIAGNOSIS — M1990 Unspecified osteoarthritis, unspecified site: Secondary | ICD-10-CM | POA: Diagnosis not present

## 2023-07-02 DIAGNOSIS — E11622 Type 2 diabetes mellitus with other skin ulcer: Secondary | ICD-10-CM | POA: Insufficient documentation

## 2023-07-02 DIAGNOSIS — I110 Hypertensive heart disease with heart failure: Secondary | ICD-10-CM | POA: Diagnosis not present

## 2023-07-02 DIAGNOSIS — L97812 Non-pressure chronic ulcer of other part of right lower leg with fat layer exposed: Secondary | ICD-10-CM | POA: Insufficient documentation

## 2023-07-02 DIAGNOSIS — I890 Lymphedema, not elsewhere classified: Secondary | ICD-10-CM | POA: Diagnosis not present

## 2023-07-02 DIAGNOSIS — L97822 Non-pressure chronic ulcer of other part of left lower leg with fat layer exposed: Secondary | ICD-10-CM | POA: Insufficient documentation

## 2023-07-02 DIAGNOSIS — I5042 Chronic combined systolic (congestive) and diastolic (congestive) heart failure: Secondary | ICD-10-CM | POA: Insufficient documentation

## 2023-07-02 DIAGNOSIS — E1140 Type 2 diabetes mellitus with diabetic neuropathy, unspecified: Secondary | ICD-10-CM | POA: Diagnosis not present

## 2023-07-02 NOTE — Progress Notes (Signed)
CAROLL, PRIMMER ([id number]) [id number] Page 1 of 4 Visit Report for 07/02/2023 Abuse Risk Screen Details Patient Name: Date of Service: [name], [name] 07/02/2023 1:30 PM Medical Record Number: [id number] Patient Account Number: [id number] Date of Birth/Sex: Treating RN: [birth year] (89 y.o. F) Primary Care Marg Macmaster: [name] Other Clinician: Referring Nicha Hemann: Treating Sheril Hammond/Extender: Loetta Rough in Treatment: 0 Abuse Risk Screen Items Answer ABUSE RISK SCREEN: Has anyone close to you tried to hurt or harm you recentlyo No Do you feel uncomfortable with anyone in your familyo No Has anyone forced you do things that you didnt want to doo No Electronic Signature(s) Signed: 07/02/2023 4:56:42 PM By: Thayer Dallas Entered By: Thayer Dallas on 07/02/2023 13:51:47 -------------------------------------------------------------------------------- Activities of Daily Living Details Patient Name: Date of Service: Katherine, [name] 07/02/2023 1:30 PM Medical Record Number: [id number] Patient Account Number: [id number] Date of Birth/Sex: Treating RN: 1931/11/23 (89 y.o. F) Primary Care Azariah Latendresse: [name] Other Clinician: Referring Jobina Maita: Treating Jomes Giraldo/Extender: Loetta Rough in Treatment: 0 Activities of Daily Living Items Answer Activities of Daily Living (Please select one for each item) Drive Automobile Not Able T Medications ake Completely Able Use T elephone Completely Able Care for Appearance Completely Able Use T oilet Completely Able Bath / Shower Completely Able Dress Self Completely Able Feed Self Completely Able Walk Completely Able Get In / Out Bed Completely Able Housework Completely Able Prepare Meals Completely Able Handle Money Completely Able Shop for Self Completely Able Electronic Signature(s) Signed: 07/02/2023 4:56:42 PM By: Thayer Dallas Entered By: Thayer Dallas on 07/02/2023 13:56:17 [name] ([id number]) [id number] Page 2 of 4 -------------------------------------------------------------------------------- Education Screening Details Patient Name: Date of Service: KENYONA, LITTERIO 07/02/2023 1:30 PM Medical Record Number: [id number] Patient Account Number: [id number] Date of Birth/Sex: Treating RN: 20-Oct-1931 (89 y.o. F) Primary Care Mertis Mosher: [name] Other Clinician: Referring Dayanis Bergquist: Treating Davin Muramoto/Extender: Loetta Rough in Treatment: 0 Primary Learner Assessed: Patient Learning Preferences/Education Level/Primary Language Learning Preference: Explanation, Demonstration, Printed Material Highest Education Level: High School Preferred Language: English Cognitive Barrier Language Barrier: No Translator Needed: No Memory Deficit: No Emotional Barrier: No Cultural/Religious Beliefs Affecting Medical Care: No Physical Barrier Impaired Vision: Yes Glasses Impaired Hearing: No Decreased Hand dexterity: No Knowledge/Comprehension Knowledge Level: High Comprehension Level: High Ability to understand written instructions: High Ability to understand verbal instructions: High Motivation Anxiety Level: Calm Cooperation: Cooperative Education Importance: Acknowledges Need Interest in Health Problems: Asks Questions Perception: Coherent Willingness to Engage in Self-Management High Activities: Readiness to Engage in Self-Management High Activities: Electronic Signature(s) Signed: 07/02/2023 4:56:42 PM By: Thayer Dallas Entered By: Thayer Dallas on 07/02/2023 13:54:17 -------------------------------------------------------------------------------- Fall Risk Assessment Details Patient Name: Date of Service: [name] 07/02/2023 1:30 PM Medical Record Number: [id number] Patient Account Number:  [id number] Date of Birth/Sex: Treating RN: [birth year] (89 y.o. F) Primary Care Graclyn Lawther: [name] Other Clinician: Referring Fredrik Mogel: Treating Renardo Cheatum/Extender: Loetta Rough in Treatment: 0 Fall Risk Assessment Items Have you had 2 or more falls in the last 12 monthso 0 No Beckstrom, Disha G ([id number]) [id number] Page 3 of 4 Have you had any fall that resulted in injury in the last 12 monthso 0 No FALLS RISK SCREEN History of falling - immediate or within 3 months 0 No Secondary diagnosis (Do you have 2 or more medical diagnoseso) 0 No Ambulatory aid None/bed rest/wheelchair/nurse 0 No Crutches/cane/walker 15 Yes Furniture 0 No  Intravenous therapy Access/Saline/Heparin Lock 0 No Gait/Transferring Normal/ bed rest/ wheelchair 0 Yes Weak (short steps with or without shuffle, stooped but able to lift head while walking, may seek 0 No support from furniture) Impaired (short steps with shuffle, may have difficulty arising from chair, head down, impaired 0 No balance) Mental Status Oriented to own ability 0 Yes Electronic Signature(s) Signed: 07/02/2023 4:56:42 PM By: Thayer Dallas Entered By: Thayer Dallas on 07/02/2023 13:54:36 -------------------------------------------------------------------------------- Foot Assessment Details Patient Name: Date of Service: [name] 07/02/2023 1:30 PM Medical Record Number: [id number] Patient Account Number: [id number] Date of Birth/Sex: Treating RN: Nov 11, 1931 (89 y.o. F) Primary Care Nickson Middlesworth: [name] Other Clinician: Referring Alayne Estrella: Treating Benedict Kue/Extender: [name]: 0 Foot Assessment Items Site Locations + = Sensation present, - = Sensation absent, C = Callus, U = Ulcer R = Redness, W = Warmth, M = Maceration, PU = Pre-ulcerative lesion F = Fissure, S = Swelling, D =  Dryness Assessment Right: Left: Other Deformity: No No Prior Foot Ulcer: No No Prior Amputation: No No Charcot Joint: No No Ambulatory Status: Ambulatory With Help Assistance Device: CHERLY, HARTSFIELD ([id number]) [id number] Page 4 of 4 Gait: Steady Electronic Signature(s) Signed: 07/02/2023 4:56:42 PM By: Thayer Dallas Entered By: Thayer Dallas on 07/02/2023 13:57:11 -------------------------------------------------------------------------------- Nutrition Risk Screening Details Patient Name: Date of Service: TALISE, SANDERS 07/02/2023 1:30 PM Medical Record Number: [id number] Patient Account Number: [id number] Date of Birth/Sex: Treating RN: 19-Jun-1931 (89 y.o. F) Primary Care Avian Greenawalt: [name] Other Clinician: Referring Keondrick Dilks: Treating Ceci Taliaferro/Extender: [name]: 0 Height (in): 62 Weight (lbs): 220 Body Mass Index (BMI): 40.2 Nutrition Risk Screening Items Score Screening NUTRITION RISK SCREEN: I have an illness or condition that made me change the kind and/or amount of food I eat 0 No I eat fewer than two meals per day 0 No I eat few fruits and vegetables, or milk products 0 No I have three or more drinks of beer, liquor or wine almost every day 2 Yes I have tooth or mouth problems that make it hard for me to eat 0 No I don't always have enough money to buy the food I need 0 No I eat alone most of the time 0 No I take three or more different prescribed or over-the-counter drugs a day 0 No Without wanting to, I have lost or gained 10 pounds in the last six months 2 Yes I am not always physically able to shop, cook and/or feed myself 0 No Nutrition Protocols Good Risk Protocol Moderate Risk Protocol 0 Provide education on nutrition High Risk Proctocol Risk Level: Moderate Risk Score: 4 Electronic Signature(s) Signed: 07/02/2023 4:56:42 PM By: Thayer Dallas Entered By: Thayer Dallas on 07/02/2023 14:00:13

## 2023-07-03 NOTE — Progress Notes (Signed)
[name] ([id number]) [id number] Page 1 of 10 Visit Report for 07/02/2023 Chief Complaint Document Details Patient Name: Date of Service: [name], [name] 07/02/2023 1:30 PM Medical Record Number: [id number] Patient Account Number: [id number] Date of Birth/Sex: Treating RN: 1931/02/14 (89 y.o. F) Primary Care Provider: [name] Other Clinician: Referring Provider: Treating Provider/Extender: Loetta Rough in Treatment: 0 Information Obtained from: Patient Chief Complaint 07/02/2023; bilateral lower extremity wounds Electronic Signature(s) Signed: 07/02/2023 4:37:23 PM By: Geralyn Corwin DO Entered By: Geralyn Corwin on 07/02/2023 14:32:34 -------------------------------------------------------------------------------- Debridement Details Patient Name: Date of Service: [name] 07/02/2023 1:30 PM Medical Record Number: [id number] Patient Account Number: [id number] Date of Birth/Sex: Treating RN: 09-16-1931 (89 y.o. [name], [name] Primary Care Provider: [name] Other Clinician: Referring Provider: Treating Provider/Extender: Loetta Rough in Treatment: 0 Debridement Performed for Assessment: Wound #10 Right,Medial Upper Leg Performed By: Physician Geralyn Corwin, DO Debridement Type: Chemical/Enzymatic/Mechanical Agent Used: gauze and wound cleanser Severity of Tissue Pre Debridement: Fat layer exposed Level of Consciousness (Pre-procedure): Awake and Alert Pre-procedure Verification/Time Out No Taken: Percent of Wound Bed Debrided: Bleeding: None Response to Treatment: Procedure was tolerated well Level of Consciousness (Post- Awake and Alert procedure): Post Debridement Measurements of Total Wound Length: (cm) 0.5 Width: (cm) 0.4 Depth: (cm) 0.1 Volume: (cm) 0.016 Character of Wound/Ulcer Post Debridement: Improved Severity of Tissue Post  Debridement: Limited to breakdown of skin Post Procedure Diagnosis Same as Pre-procedure Electronic Signature(s) Signed: 07/02/2023 4:37:23 PM By: Geralyn Corwin DO Signed: 07/02/2023 6:21:10 PM By: [name] RN, BSN Bell, [name] ([id number]) [id number] Page 2 of 10 Entered By: [name] on 07/02/2023 14:25:16 -------------------------------------------------------------------------------- HPI Details Patient Name: Date of Service: [name], [name] 07/02/2023 1:30 PM Medical Record Number: [id number] Patient Account Number: [id number] Date of Birth/Sex: Treating RN: 11-17-1931 (89 y.o. F) Primary Care Provider: [name] Other Clinician: Referring Provider: Treating Provider/Extender: Loetta Rough in Treatment: 0 History of Present Illness HPI Description: 07/02/2023 Ms. 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 She is not quite sure how the right upper leg wound started. The left lower extremity wound started out as blisters. She has compression stockings but has not been wearing them. She takes Lasix 20 mg daily. She currently denies signs of infection. In office ABIs were 0.57 on the left however dorsalis pedal pulses and posterior tibialis pulses were heard on Doppler. Electronic Signature(s) Signed: 07/02/2023 4:37:23 PM By: Geralyn Corwin DO Entered By: Geralyn Corwin on 07/02/2023 14:51:55 -------------------------------------------------------------------------------- Physical Exam Details Patient Name: Date of Service: [name], [name] 07/02/2023 1:30 PM Medical Record Number: [id number] Patient Account Number: [id number] Date of Birth/Sex: Treating  RN: 12-24-1930 (89 y.o. F) Primary Care Provider: [name] Other Clinician: Referring Provider: Treating Provider/Extender: [name]: 0 Constitutional respirations regular, non-labored and within target range for patient.. Cardiovascular 2+ dorsalis pedis/posterior tibialis pulses. Psychiatric pleasant and cooperative. Notes Right lower extremity: T the inner medial upper leg where the knee bends there is an open wound with granulation tissue and fibrotic tissue. o Left lower extremity: T the anterior aspect there are 3 open wounds with fibrinous tissue and granulation tissue and slough. 2+ pitting edema to the knees o bilaterally.  No surrounding signs of infection. Dorsalis pedis and posterior tibialis pulses heard on Doppler. Electronic Signature(s) Signed: 07/02/2023 4:37:23 PM By: Geralyn Corwin DO Entered By: Geralyn Corwin on 07/02/2023 14:54:50 B[place]berg, [name] ([id number]) [id number] Page 3 of 10 -------------------------------------------------------------------------------- Physician Orders Details Patient Name: Date of Service: [name], [name] 07/02/2023 1:30 PM Medical Record Number: [id number] Patient Account Number: [id number] Date of Birth/Sex: Treating RN: [birth year] (89 y.o. [name], [name] Primary Care Provider: [name] Other Clinician: Referring Provider: Treating Provider/Extender: Loetta Rough in Treatment: 0 Verbal / Phone Orders: No Diagnosis Coding ICD-10 Coding Code Description [phone] Non-pressure chronic ulcer of other part of right lower leg with fat layer exposed L97.822 Non-pressure chronic ulcer of other part of left lower leg with fat layer exposed I89.0 Lymphedema, not elsewhere classified E11.622 Type 2 diabetes mellitus with other skin ulcer I50.42 Chronic combined systolic (congestive) and diastolic  (congestive) heart failure Follow-up Appointments ppointment in 1 week. - Dr. Mikey Bussing Tuesday 07/09/2023 2pm Return A Room 8 ppointment in 2 weeks. - Dr .Mikey Bussing Tuesday 130pm 07/16/2023 Return A Room 8 Nurse Visit: - 1130 Friday morning room7 07/05/2023 Anesthetic (In clinic) Topical Lidocaine 4% applied to wound bed Bathing/ Shower/ Hygiene May shower with protection but do not get wound dressing(s) wet. Protect dressing(s) with water repellant cover (for example, large plastic bag) or a cast cover and may then take shower. Edema Control - Lymphedema / SCD / Other Elevate legs to the level of the heart or above for 30 minutes daily and/or when sitting for 3-4 times a day throughout the day. Avoid standing for long periods of time. Additional Orders / Instructions Follow Nutritious Diet - increase protein Juven Shake 1-2 times daily. Wound Treatment Wound #10 - Upper Leg Wound Laterality: Right, Medial Cleanser: Soap and Water 1 x Per Day/30 Days Discharge Instructions: May shower and wash wound with dial antibacterial soap and water prior to dressing change. Cleanser: Byram Ancillary Kit - 15 Day Supply (DME) (Generic) 1 x Per Day/30 Days Discharge Instructions: Use supplies as instructed; Kit contains: (15) Saline Bullets; (15) 3x3 Gauze; 15 pr Gloves Peri-Wound Care: Skin Prep (DME) (Generic) 1 x Per Day/30 Days Discharge Instructions: Use skin prep as directed Prim Dressing: Hydrofera Blue Ready Transfer Foam, 2.5x2.5 (in/in) (DME) (Generic) 1 x Per Day/30 Days ary Discharge Instructions: Apply directly to wound bed as directed Secondary Dressing: Zetuvit Plus Silicone Border Dressing 4x4 (in/in) (DME) (Generic) 1 x Per Day/30 Days Discharge Instructions: Apply silicone border over primary dressing as directed. Wound #11 - Lower Leg Wound Laterality: Left, Proximal Cleanser: Soap and Water 2 x Per Week/30 Days Discharge Instructions: May shower and wash wound with dial  antibacterial soap and water prior to dressing change. Cleanser: Vashe 5.8 (oz) 2 x Per Week/30 Days Discharge Instructions: Cleanse the wound with Vashe prior to applying a clean dressing using gauze sponges, not tissue or cotton balls. Peri-Wound Care: Sween Lotion (Moisturizing lotion) 2 x Per Week/30 Days Discharge Instructions: Apply moisturizing lotion as directed [name], [name] ([id number]) [id number] Page 4 of 10 Topical: Gentamicin 2 x Per Week/30 Days Discharge Instructions: As directed by physician Topical: Mupirocin Ointment 2 x Per Week/30 Days Discharge Instructions: Apply Mupirocin (Bactroban) as instructed Prim Dressing: Hydrofera Blue Ready Transfer Foam, 2.5x2.5 (in/in) 2 x Per Week/30 Days ary Discharge Instructions: Apply directly to wound bed as directed Secondary Dressing: ABD Pad, 8x10 2 x Per Week/30 Days Discharge Instructions: Apply over primary dressing as directed.  Secondary Dressing: Woven Gauze Sponge, Non-Sterile 4x4 in 2 x Per Week/30 Days Discharge Instructions: Apply over primary dressing as directed. Secondary Dressing: Zetuvit Plus Silicone Border Dressing 4x4 (in/in) 2 x Per Week/30 Days Discharge Instructions: Apply silicone border over primary dressing as directed. Compression Wrap: Kerlix Roll 4.5x3.1 (in/yd) 2 x Per Week/30 Days Discharge Instructions: Apply Kerlix and Coban compression as directed. *****Roland Rack BOOT first layer apply to upper portion of lower leg. Compression Wrap: Coban Self-Adherent Wrap 4x5 (in/yd) 2 x Per Week/30 Days Discharge Instructions: Apply over Kerlix as directed. Wound #12 - Lower Leg Wound Laterality: Left, Anterior, Distal Cleanser: Soap and Water 2 x Per Week/30 Days Discharge Instructions: May shower and wash wound with dial antibacterial soap and water prior to dressing change. Cleanser: Vashe 5.8 (oz) 2 x Per Week/30 Days Discharge Instructions: Cleanse the wound with Vashe prior to  applying a clean dressing using gauze sponges, not tissue or cotton balls. Peri-Wound Care: Sween Lotion (Moisturizing lotion) 2 x Per Week/30 Days Discharge Instructions: Apply moisturizing lotion as directed Topical: Gentamicin 2 x Per Week/30 Days Discharge Instructions: As directed by physician Topical: Mupirocin Ointment 2 x Per Week/30 Days Discharge Instructions: Apply Mupirocin (Bactroban) as instructed Prim Dressing: Hydrofera Blue Ready Transfer Foam, 2.5x2.5 (in/in) 2 x Per Week/30 Days ary Discharge Instructions: Apply directly to wound bed as directed Secondary Dressing: ABD Pad, 8x10 2 x Per Week/30 Days Discharge Instructions: Apply over primary dressing as directed. Secondary Dressing: Woven Gauze Sponge, Non-Sterile 4x4 in 2 x Per Week/30 Days Discharge Instructions: Apply over primary dressing as directed. Secondary Dressing: Zetuvit Plus Silicone Border Dressing 4x4 (in/in) 2 x Per Week/30 Days Discharge Instructions: Apply silicone border over primary dressing as directed. Compression Wrap: Kerlix Roll 4.5x3.1 (in/yd) 2 x Per Week/30 Days Discharge Instructions: Apply Kerlix and Coban compression as directed. *****Roland Rack BOOT first layer apply to upper portion of lower leg. Compression Wrap: Coban Self-Adherent Wrap 4x5 (in/yd) 2 x Per Week/30 Days Discharge Instructions: Apply over Kerlix as directed. Services and Therapies rterial Studies- Bilateral - Dr. Allyson Sabal office STAT***** Arterial studies with ABIs and TBIs related to wound BLE and poor ABIs in clinic. - (ICD10 A E11.622 - Type 2 diabetes mellitus with other skin ulcer) Patient Medications llergies: lisinopril A Notifications Medication Indication Start End as needed for any 07/02/2023 lidocaine debridement in clinic. DOSE topical 4 % cream - cream topical once daily Electronic Signature(s) Signed: 07/02/2023 4:37:23 PM By: Lafonda Mosses, Signed: 07/02/2023 4:37:23 PM By: Carroll Kinds ([id number]) [id number] Page 5 of 10 Entered By: Geralyn Corwin on 07/02/2023 14:55:08 Prescription 07/02/2023 -------------------------------------------------------------------------------- [name] Geralyn Corwin DO Patient Name: Provider: 05-12-1931 [id number] Date of Birth: NPI#: [id number] Sex: DEA #: [phone] Phone #: License #: UPN: Patient Address: [address] [facility] [place], [place] [address] [address] [place], [place] [address] [phone] Allergies lisinopril Provider's Orders rterial Studies- Bilateral - ICD10: E11.622 - Dr. Allyson Sabal office STAT***** Arterial studies with ABIs and TBIs related to wound BLE and poor ABIs in A clinic. Hand Signature: Date(s): Electronic Signature(s) Signed: 07/02/2023 4:37:23 PM By: Geralyn Corwin DO Entered By: Geralyn Corwin on 07/02/2023 14:55:09 -------------------------------------------------------------------------------- Problem List Details Patient Name: Date of Service: [name], [name] 07/02/2023 1:30 PM Medical Record Number: [id number] Patient Account Number: [id number] Date of Birth/Sex: Treating RN: 1931/03/03 (89 y.o. F) Primary Care Provider: [name] Other Clinician: Referring  Provider: Treating Provider/Extender: Loetta Rough in Treatment: 0 Active Problems ICD-10 Encounter Code Description Active Date MDM Diagnosis L97.812 Non-pressure chronic ulcer of other part of right lower leg with fat layer 07/02/2023 No Yes exposed L97.822 Non-pressure chronic ulcer of other part of left lower leg with fat layer exposed7/30/2024 No Yes I89.0 Lymphedema, not elsewhere classified 07/02/2023 No Yes [name], [name] ([id number]) [id number] Page 6 of 10 E11.622 Type 2 diabetes mellitus with other skin ulcer  07/02/2023 No Yes I50.42 Chronic combined systolic (congestive) and diastolic (congestive) heart failure 07/02/2023 No Yes Inactive Problems Resolved Problems Electronic Signature(s) Signed: 07/02/2023 4:37:23 PM By: Geralyn Corwin DO Entered By: Geralyn Corwin on 07/02/2023 14:31:56 -------------------------------------------------------------------------------- Progress Note Details Patient Name: Date of Service: [name] 07/02/2023 1:30 PM Medical Record Number: [id number] Patient Account Number: [id number] Date of Birth/Sex: Treating RN: 1931-11-06 (89 y.o. F) Primary Care Provider: [name] Other Clinician: Referring Provider: Treating Provider/Extender: Loetta Rough in Treatment: 0 Subjective Chief Complaint Information obtained from Patient 07/02/2023; bilateral lower extremity wounds History of Present Illness (HPI) 07/02/2023 Ms. 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 She is not quite sure how the right upper leg wound started. The left lower extremity wound started out as blisters. She has compression stockings but has not been wearing them. She takes Lasix 20 mg daily. She currently denies signs of infection. In office ABIs were 0.57 on the left however dorsalis pedal pulses and posterior tibialis pulses were heard on Doppler. Patient History Allergies lisinopril (Severity: Mild, Reaction: cough) Medical History Hematologic/Lymphatic Patient has history of Anemia Cardiovascular Patient has history of Congestive Heart Failure, Hypertension, Peripheral Venous Disease Endocrine Patient has history of Type II Diabetes Musculoskeletal Patient has history of  Osteoarthritis Neurologic Patient has history of Neuropathy Medical A Surgical History Notes nd Cardiovascular hyperlipidemia, venous stasis Gastrointestinal GERD Psychiatric generalized anxiety [name], [name] ([id number]) [id number] Page 7 of 10 Objective Constitutional respirations regular, non-labored and within target range for patient.. Vitals Time Taken: 1:50 PM, Height: 62 in, Source: Stated, Weight: 220 lbs, Source: Stated, BMI: 40.2, Temperature: 98 F, Pulse: 71 bpm, Respiratory Rate: 18 breaths/min, Blood Pressure: 106/68 mmHg, Capillary Blood Glucose: 102 mg/dl. Cardiovascular 2+ dorsalis pedis/posterior tibialis pulses. Psychiatric pleasant and cooperative. General Notes: Right lower extremity: T the inner medial upper leg where the knee bends there is an open wound with granulation tissue and fibrotic tissue. o Left lower extremity: T the anterior aspect there are 3 open wounds with fibrinous tissue and granulation tissue and slough. 2+ pitting edema to the knees o bilaterally. No surrounding signs of infection. Dorsalis pedis and posterior tibialis pulses heard on Doppler. Integumentary (Hair, Skin) Wound #10 status is Open. Original cause of wound was Blister. The date acquired was: 06/12/2023. The wound is located on the Right,Medial Upper Leg. The wound measures 0.5cm length x 0.4cm width x 0.1cm depth; 0.157cm^2 area and 0.016cm^3 volume. There is Fat Layer (Subcutaneous Tissue) exposed. There is no tunneling or undermining noted. There is a medium amount of serosanguineous drainage noted. The wound margin is distinct with the outline attached to the wound base. There is small (1-33%) pink granulation within the wound bed. There is a large (67-100%) amount of necrotic tissue within the wound bed  including Adherent Slough. The periwound skin appearance exhibited: Scarring. The periwound skin appearance did not exhibit: Callus, Crepitus,  Excoriation, Induration, Rash, Dry/Scaly, Maceration, Atrophie Blanche, Cyanosis, Ecchymosis, Hemosiderin Staining, Mottled, Pallor, Rubor, Erythema. Periwound temperature was noted as No Abnormality. Wound #11 status is Open. Original cause of wound was Bite. The date acquired was: 06/12/2023. The wound is located on the Left,Proximal Lower Leg. The wound measures 2cm length x 1.7cm width x 0.1cm depth; 2.67cm^2 area and 0.267cm^3 volume. There is Fat Layer (Subcutaneous Tissue) exposed. There is no tunneling or undermining noted. There is a medium amount of serosanguineous drainage noted. The wound margin is distinct with the outline attached to the wound base. There is large (67-100%) red granulation within the wound bed. There is a small (1-33%) amount of necrotic tissue within the wound bed including Adherent Slough. The periwound skin appearance exhibited: Scarring, Hemosiderin Staining. The periwound skin appearance did not exhibit: Callus, Crepitus, Excoriation, Induration, Rash, Dry/Scaly, Maceration, Atrophie Blanche, Cyanosis, Ecchymosis, Mottled, Pallor, Rubor, Erythema. Periwound temperature was noted as No Abnormality. Wound #12 status is Open. Original cause of wound was Blister. The date acquired was: 06/12/2023. The wound is located on the [facility] Lower Leg. The wound measures 2.3cm length x 2.1cm width x 0.1cm depth; 3.793cm^2 area and 0.379cm^3 volume. There is no tunneling or undermining noted. There is a medium amount of serosanguineous drainage noted. The wound margin is distinct with the outline attached to the wound base. There is small (1-33%) pink, pale granulation within the wound bed. There is a large (67-100%) amount of necrotic tissue within the wound bed including Adherent Slough. The periwound skin appearance exhibited: Scarring, Hemosiderin Staining. The periwound skin appearance did not exhibit: Callus, Crepitus, Excoriation, Induration, Rash, Dry/Scaly,  Maceration, Atrophie Blanche, Cyanosis, Ecchymosis, Mottled, Pallor, Rubor, Erythema. Periwound temperature was noted as No Abnormality. 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 ABIs were documented as 0.57 on the left. There is significant swelling on exam and I think this is skewing the results however we will obtain formal ABIs with TBI's to the lower extremities bilaterally. I can feel and hear dorsalis pedis pulses and posterior tibial pulses on Doppler. She would benefit from a compression wrap but we will do a light one with Kerlix/Coban. I recommended Hydrofera Blue under the wrap. Will bring her in for nurse visit later this week for wrap change and then I will see her in 1 week. She is currently taking 20 mg of Lasix but states that her cardiologist Dr. Gery Pray recommended going up to 40 mg daily if she were to develop swelling/weeping. I recommended she do this for the next week and let Dr. Benay Spice office know about this change. Procedures Wound #10 Pre-procedure diagnosis of Wound #10 is a Diabetic Wound/Ulcer of the Lower Extremity located on the Right,Medial Upper Leg .Severity of Tissue Pre Debridement is: Fat layer exposed. There was a Chemical/Enzymatic/Mechanical debridement performed by Geralyn Corwin, DO.. Other agent used was gauze and wound cleanser. There was no bleeding. The procedure was tolerated  well. Post  Debridement Measurements: 0.5cm length x 0.4cm width x 0.1cm depth; 0.016cm^3 volume. Character of Wound/Ulcer Post Debridement is improved. Severity of Tissue Post Debridement is: Limited to breakdown of skin. Post procedure Diagnosis Wound #10: Same as Pre-Procedure [name], [name] ([id number]) [id number] Page 8 of 10 Plan Follow-up Appointments: Return Appointment in 1 week. - Dr. Mikey Bussing Tuesday 07/09/2023 2pm Room 8 Return Appointment in 2 weeks. - Dr .Mikey Bussing Tuesday 130pm 07/16/2023 Room 8 Nurse Visit: - 1130 Friday morning room7 07/05/2023 Anesthetic: (In clinic) Topical Lidocaine 4% applied to wound bed Bathing/ Shower/ Hygiene: May shower with protection but do not get wound dressing(s) wet. Protect dressing(s) with water repellant cover (for example, large plastic bag) or a cast cover and may then take shower. Edema Control - Lymphedema / SCD / Other: Elevate legs to the level of the heart or above for 30 minutes daily and/or when sitting for 3-4 times a day throughout the day. Avoid standing for long periods of time. Additional Orders / Instructions: Follow Nutritious Diet - increase protein Juven Shake 1-2 times daily. Services and Therapies ordered were: Arterial Studies- Bilateral - Dr. Allyson Sabal office STAT***** Arterial studies with ABIs and TBIs related to wound BLE and poor ABIs in clinic. The following medication(s) was prescribed: lidocaine topical 4 % cream cream topical once daily for as needed for any debridement in clinic. was prescribed at facility WOUND #10: - Upper Leg Wound Laterality: Right, Medial Cleanser: Soap and Water 1 x Per Day/30 Days Discharge Instructions: May shower and wash wound with dial antibacterial soap and water prior to dressing change. Cleanser: Byram Ancillary Kit - 15 Day Supply (DME) (Generic) 1 x Per Day/30 Days Discharge Instructions: Use supplies as instructed; Kit contains: (15) Saline Bullets; (15) 3x3 Gauze;  15 pr Gloves Peri-Wound Care: Skin Prep (DME) (Generic) 1 x Per Day/30 Days Discharge Instructions: Use skin prep as directed Prim Dressing: Hydrofera Blue Ready Transfer Foam, 2.5x2.5 (in/in) (DME) (Generic) 1 x Per Day/30 Days ary Discharge Instructions: Apply directly to wound bed as directed Secondary Dressing: Zetuvit Plus Silicone Border Dressing 4x4 (in/in) (DME) (Generic) 1 x Per Day/30 Days Discharge Instructions: Apply silicone border over primary dressing as directed. WOUND #11: - Lower Leg Wound Laterality: Left, Proximal Cleanser: Soap and Water 2 x Per Week/30 Days Discharge Instructions: May shower and wash wound with dial antibacterial soap and water prior to dressing change. Cleanser: Vashe 5.8 (oz) 2 x Per Week/30 Days Discharge Instructions: Cleanse the wound with Vashe prior to applying a clean dressing using gauze sponges, not tissue or cotton balls. Peri-Wound Care: Sween Lotion (Moisturizing lotion) 2 x Per Week/30 Days Discharge Instructions: Apply moisturizing lotion as directed Topical: Gentamicin 2 x Per Week/30 Days Discharge Instructions: As directed by physician Topical: Mupirocin Ointment 2 x Per Week/30 Days Discharge Instructions: Apply Mupirocin (Bactroban) as instructed Prim Dressing: Hydrofera Blue Ready Transfer Foam, 2.5x2.5 (in/in) 2 x Per Week/30 Days ary Discharge Instructions: Apply directly to wound bed as directed Secondary Dressing: ABD Pad, 8x10 2 x Per Week/30 Days Discharge Instructions: Apply over primary dressing as directed. Secondary Dressing: Woven Gauze Sponge, Non-Sterile 4x4 in 2 x Per Week/30 Days Discharge Instructions: Apply over primary dressing as directed. Secondary Dressing: Zetuvit Plus Silicone Border Dressing 4x4 (in/in) 2 x Per Week/30 Days Discharge Instructions: Apply silicone border over primary dressing as directed. Com pression Wrap: Kerlix Roll 4.5x3.1 (in/yd) 2 x Per Week/30 Days Discharge Instructions: Apply  Kerlix and Coban compression as directed. *****Henriette Combs  first layer apply to upper portion of lower leg. Com pression Wrap: Coban Self-Adherent Wrap 4x5 (in/yd) 2 x Per Week/30 Days Discharge Instructions: Apply over Kerlix as directed. WOUND #12: - Lower Leg Wound Laterality: Left, Anterior, Distal Cleanser: Soap and Water 2 x Per Week/30 Days Discharge Instructions: May shower and wash wound with dial antibacterial soap and water prior to dressing change. Cleanser: Vashe 5.8 (oz) 2 x Per Week/30 Days Discharge Instructions: Cleanse the wound with Vashe prior to applying a clean dressing using gauze sponges, not tissue or cotton balls. Peri-Wound Care: Sween Lotion (Moisturizing lotion) 2 x Per Week/30 Days Discharge Instructions: Apply moisturizing lotion as directed Topical: Gentamicin 2 x Per Week/30 Days Discharge Instructions: As directed by physician Topical: Mupirocin Ointment 2 x Per Week/30 Days Discharge Instructions: Apply Mupirocin (Bactroban) as instructed Prim Dressing: Hydrofera Blue Ready Transfer Foam, 2.5x2.5 (in/in) 2 x Per Week/30 Days ary Discharge Instructions: Apply directly to wound bed as directed Secondary Dressing: ABD Pad, 8x10 2 x Per Week/30 Days Discharge Instructions: Apply over primary dressing as directed. Secondary Dressing: Woven Gauze Sponge, Non-Sterile 4x4 in 2 x Per Week/30 Days Discharge Instructions: Apply over primary dressing as directed. Secondary Dressing: Zetuvit Plus Silicone Border Dressing 4x4 (in/in) 2 x Per Week/30 Days Discharge Instructions: Apply silicone border over primary dressing as directed. Com pression Wrap: Kerlix Roll 4.5x3.1 (in/yd) 2 x Per Week/30 Days Discharge Instructions: Apply Kerlix and Coban compression as directed. *****Roland Rack BOOT first layer apply to upper portion of lower leg. Com pression Wrap: Coban Self-Adherent Wrap 4x5 (in/yd) 2 x Per Week/30 Days Discharge Instructions: Apply over Kerlix as directed. 1.  Hydrofera Blue 2. Kerlix/Cobanleft lower extremity 3. ABIs and TBIs 4. Follow up later this week for nurse visit and one week with physician Electronic Signature(s) [name], [name] ([id number]) [id number] Page 9 of 10 Signed: 07/02/2023 4:37:23 PM By: Geralyn Corwin DO Entered By: Geralyn Corwin on 07/02/2023 14:58:53 -------------------------------------------------------------------------------- HxROS Details Patient Name: Date of Service: [name], [name] 07/02/2023 1:30 PM Medical Record Number: [id number] Patient Account Number: [id number] Date of Birth/Sex: Treating RN: [birth year] (89 y.o. [name] Primary Care Provider: [name] Other Clinician: Referring Provider: Treating Provider/Extender: Loetta Rough in Treatment: 0 Hematologic/Lymphatic Medical History: Positive for: Anemia Cardiovascular Medical History: Positive for: Congestive Heart Failure; Hypertension; Peripheral Venous Disease Past Medical History Notes: hyperlipidemia, venous stasis Gastrointestinal Medical History: Past Medical History Notes: GERD Endocrine Medical History: Positive for: Type II Diabetes Genitourinary Musculoskeletal Medical History: Positive for: Osteoarthritis Neurologic Medical History: Positive for: Neuropathy Psychiatric Medical History: Past Medical History Notes: generalized anxiety Immunizations Pneumococcal Vaccine: Received Pneumococcal Vaccination: Yes Received Pneumococcal Vaccination On or After 60th Birthday: Yes Implantable Devices No devices added Electronic Signature(s) Signed: 07/02/2023 4:37:23 PM By: Geralyn Corwin DO Signed: 07/02/2023 5:06:56 PM By: Redmond Pulling RN, BSN Entered By: Redmond Pulling on 07/01/2023 17:25:24 [name] ([id number]) [id number] Page 10 of  10 -------------------------------------------------------------------------------- SuperBill Details Patient Name: Date of Service: MILESSA, CUMBA 07/02/2023 Medical Record Number: [id number] Patient Account Number: [id number] Date of Birth/Sex: Treating RN: March 29, 1931 (89 y.o. [name], Yvonne Kendall Primary Care Provider: [name] Other Clinician: Referring Provider: Treating Provider/Extender: Loetta Rough in Treatment: 0 Diagnosis Coding ICD-10 Codes Code Description [phone] Non-pressure chronic ulcer of other part of right lower leg with fat layer exposed L97.822 Non-pressure chronic ulcer of other part of left lower leg with fat layer exposed I89.0 Lymphedema, not elsewhere classified E11.622 Type  2 diabetes mellitus with other skin ulcer I50.42 Chronic combined systolic (congestive) and diastolic (congestive) heart failure Facility Procedures : CPT4 Code: 16109604 Description: 99213 - WOUND CARE VISIT-LEV 3 EST PT Modifier: Quantity: 1 Physician Procedures : CPT4 Code Description Modifier [id number] - WC PHYS LEVEL 4 - NEW PT ICD-10 Diagnosis Description L97.812 Non-pressure chronic ulcer of other part of right lower leg with fat layer exposed L97.822 Non-pressure chronic ulcer of other part of left  lower leg with fat layer exposed I89.0 Lymphedema, not elsewhere classified E11.622 Type 2 diabetes mellitus with other skin ulcer Quantity: 1 Electronic Signature(s) Signed: 07/02/2023 4:37:23 PM By: Geralyn Corwin DO Entered By: Geralyn Corwin on 07/02/2023 14:59:06

## 2023-07-03 NOTE — Progress Notes (Signed)
Katherine, [name] ([id number]) [id number] Page 1 of 13 Visit Report for 07/02/2023 Allergy List Details Patient Name: Date of Service: [name], Mir 07/02/2023 1:30 PM Medical Record Number: [id number] Patient Account Number: [id number] Date of Birth/Sex: Treating RN: [birth year] (89 y.o. [name] Primary Care Gessica Jawad: [name] Other Clinician: Referring Daekwon Beswick: Treating Orly Quimby/Extender: [name]: 0 Allergies Active Allergies lisinopril Reaction: cough Severity: Mild Allergy Notes Electronic Signature(s) Signed: 07/02/2023 5:06:56 PM By: Redmond Pulling RN, BSN Entered By: Redmond Pulling on 07/01/2023 17:20:25 -------------------------------------------------------------------------------- Arrival Information Details Patient Name: Date of Service: [name] 07/02/2023 1:30 PM Medical Record Number: [id number] Patient Account Number: [id number] Date of Birth/Sex: Treating RN: [birth year] (89 y.o. F) Primary Care Shrey Boike: [name] Other Clinician: Referring Jayleena Stille: Treating Bitha Fauteux/Extender: Loetta Rough in Treatment: 0 Visit Information Patient Arrived: Walker Arrival Time: 13:20 Accompanied By: friend Transfer Assistance: None Patient Identification Verified: Yes Secondary Verification Process Completed: Yes Patient Requires Transmission-Based Precautions: No Patient Has Alerts: No History Since Last Visit Added or deleted any medications: No Any new allergies or adverse reactions: No Had a fall or experienced change in activities of daily living that may affect risk of falls: No Signs or symptoms of abuse/neglect since last visito No Hospitalized since last visit: No Pain Present Now: No Electronic Signature(s) Signed: 07/02/2023 4:56:42 PM By: Thayer Dallas Entered By: Thayer Dallas on 07/02/2023 13:50:30 [name] ([id number]) [id number] Page 2 of 13 -------------------------------------------------------------------------------- Clinic Level of Care Assessment Details Patient Name: Date of Service: [name], [name] 07/02/2023 1:30 PM Medical Record Number: [id number] Patient Account Number: [id number] Date of Birth/Sex: Treating RN: 1931/04/30 (89 y.o. [name], [name] Primary Care Daiki Dicostanzo: [name] Other Clinician: Referring Kavya Haag: Treating [name]/Extender: Loetta Rough in Treatment: 0 Clinic Level of Care Assessment Items TOOL 1 Quantity Score X- 1 0 Use when EandM and Procedure is performed on INITIAL visit ASSESSMENTS - Nursing Assessment / Reassessment X- 1 20 General Physical Exam (combine w/ comprehensive assessment (listed just below) when performed on new pt. evals) X- 1 25 Comprehensive Assessment (HX, ROS, Risk Assessments, Wounds Hx, etc.) ASSESSMENTS - Wound and Skin Assessment / Reassessment X- 1 10 Dermatologic / Skin Assessment (not related to wound area) ASSESSMENTS - Ostomy and/or Continence Assessment and Care []  - 0 Incontinence Assessment and Management []  - 0 Ostomy Care Assessment and Management (repouching, etc.) PROCESS - Coordination of Care []  - 0 Simple Patient / Family Education for ongoing care X- 1 20 Complex (extensive) Patient / Family Education for ongoing care X- 1 10 Staff obtains Chiropractor, Records, T Results / Process Orders est []  - 0 Staff telephones HHA, Nursing Homes / Clarify orders / etc []  - 0 Routine Transfer to another Facility (non-emergent condition) []  - 0 Routine Hospital Admission (non-emergent condition) X- 1 15 New Admissions / Manufacturing engineer / Ordering NPWT Apligraf, etc. , []  - 0 Emergency Hospital Admission (emergent condition) PROCESS - Special Needs []  - 0 Pediatric / Minor Patient Management []  - 0 Isolation Patient Management []  -  0 Hearing / Language / Visual special needs []  - 0 Assessment of Community assistance (transportation, D/C planning, etc.) []  - 0 Additional assistance / Altered mentation []  - 0 Support Surface(s) Assessment (bed, cushion, seat, etc.) INTERVENTIONS - Miscellaneous []  - 0 External ear exam []  - 0 Patient Transfer (multiple staff / Nurse, adult / Similar devices) []  - 0 Simple Staple /  Suture removal (25 or less) []  - 0 Complex Staple / Suture removal (26 or more) []  - 0 Hypo/Hyperglycemic Management (do not check if billed separately) X- 1 15 Ankle / Brachial Index (ABI) - do not check if billed separately Has the patient been seen at the hospital within the last three years: Yes Total Score: 115 Level Of Care: New/Established - Level 3 Electronic Signature(s) Signed: 07/02/2023 6:21:10 PM By: [name] RN, BSN Entered By: [name] on 07/02/2023 14:32:20 [name] ([id number]) [id number] Page 3 of 13 -------------------------------------------------------------------------------- Encounter Discharge Information Details Patient Name: Date of Service: [name], BEAM 07/02/2023 1:30 PM Medical Record Number: [id number] Patient Account Number: [id number] Date of Birth/Sex: Treating RN: Sep 14, 1931 (89 y.o. [name] Primary Care Marisel Tostenson: [name] Other Clinician: Referring Amberlyn Martinezgarcia: Treating [name]/Extender: Loetta Rough in Treatment: 0 Encounter Discharge Information Items Post Procedure Vitals Discharge Condition: Stable Temperature (F): 98 Ambulatory Status: Walker Pulse (bpm): 71 Discharge Destination: Home Respiratory Rate (breaths/min): 20 Transportation: Private Auto Blood Pressure (mmHg): 106/68 Accompanied By: friend Schedule Follow-up Appointment: Yes Clinical Summary of Care: Electronic Signature(s) Signed: 07/02/2023 6:21:10 PM By: [name] RN, BSN Entered By:  [name] on 07/02/2023 14:33:51 -------------------------------------------------------------------------------- Lower Extremity Assessment Details Patient Name: Date of Service: [name], Katherine 07/02/2023 1:30 PM Medical Record Number: [id number] Patient Account Number: [id number] Date of Birth/Sex: Treating RN: 04/15/1931 (89 y.o. F) Primary Care Jareb Radoncic: [name] Other Clinician: Referring Rynell Ciotti: Treating Shaurya Rawdon/Extender: [name]: 0 Edema Assessment Assessed: [Left: Yes] [Right: No] Edema: [Left: Ye] [Right: s] Calf Left: Right: Point of Measurement: 32 cm From Medial Instep 36 cm Ankle Left: Right: Point of Measurement: 11 cm From Medial Instep 24 cm Knee To Floor Left: Right: From Medial Instep 38 cm Vascular Assessment Pulses: Dorsalis Pedis Palpable: [Left:Yes] Doppler Audible: [Left:Yes] Posterior Tibial Palpable: [Left:Yes] Doppler Audible: [Left:Yes] Extremity colors, hair growth, and conditions: Extremity Color: [Left:Normal] Glassburn, Lynna G ([id number]) [Right:[id number] Page 4 of 13] Hair Growth on Extremity: [Left:Yes] Temperature of Extremity: [Left:Hot] Capillary Refill: [Left:< 3 seconds] Dependent Rubor: [Left:No] Blanched when Elevated: [Left:No] Lipodermatosclerosis: [Left:No] Blood Pressure: Brachial: [Left:106] Ankle: [Left:Dorsalis Pedis: 60 0.57] Toe Nail Assessment Left: Right: Thick: Yes Discolored: Yes Deformed: No Improper Length and Hygiene: No Electronic Signature(s) Signed: 07/02/2023 4:56:42 PM By: Thayer Dallas Entered By: Thayer Dallas on 07/02/2023 13:58:05 -------------------------------------------------------------------------------- Multi Wound Chart Details Patient Name: Date of Service: [name] 07/02/2023 1:30 PM Medical Record Number: [id number] Patient Account Number: [id number] Date of Birth/Sex: Treating  RN: 1931/11/11 (89 y.o. F) Primary Care Aroush Chasse: [name] Other Clinician: Referring Jorryn Casagrande: Treating Chantee Cerino/Extender: [name]: 0 Vital Signs Height(in): 62 Capillary Blood Glucose(mg/dl): 737 Weight(lbs): 106 Pulse(bpm): 71 Body Mass Index(BMI): 40.2 Blood Pressure(mmHg): 106/68 Temperature(F): 98 Respiratory Rate(breaths/min): 18 [10:Photos:] Right, Medial Upper Leg Left, Proximal Lower Leg Left, Distal, Anterior Lower Leg Wound Location: Blister Bite Blister Wounding Event: Diabetic Wound/Ulcer of the Lower Diabetic Wound/Ulcer of the Lower Diabetic Wound/Ulcer of the Lower Primary Etiology: Extremity Extremity Extremity Lymphedema Lymphedema Lymphedema Secondary Etiology: Anemia, Congestive Heart Failure, Anemia, Congestive Heart Failure, Anemia, Congestive Heart Failure, Comorbid History: Hypertension, Peripheral Venous Hypertension, Peripheral Venous Hypertension, Peripheral Venous Disease, Type II Diabetes, Disease, Type II Diabetes, Disease, Type II Diabetes, Osteoarthritis, Neuropathy Osteoarthritis, Neuropathy Osteoarthritis, Neuropathy 06/12/2023 06/12/2023 06/12/2023 Date Acquired: 0 0 0 Weeks of Treatment: Open Open Open Wound Status: No No No Wound Recurrence:  No Yes No Clustered Wound: N/A 2 N/A Clustered Quantity: Yes No No Pending A mputation on Presentation: 0.5x0.4x0.1 2x1.7x0.1 2.3x2.1x0.1 Measurements L x W x D (cm) 0.157 2.67 3.793 A (cm) : BREANAH, ALEXANDRA ([id number]) [id number] Page 5 of 13 0.016 0.267 0.379 Volume (cm) : Grade 1 Grade 1 Grade 1 Classification: Medium Medium Medium Exudate A mount: Serosanguineous Serosanguineous Serosanguineous Exudate Type: red, brown red, brown red, brown Exudate Color: Distinct, outline attached Distinct, outline attached Distinct, outline attached Wound Margin: Small (1-33%) Large (67-100%) Small  (1-33%) Granulation A mount: Pink Red Pink, Pale Granulation Quality: Large (67-100%) Small (1-33%) Large (67-100%) Necrotic A mount: Fat Layer (Subcutaneous Tissue): Yes Fat Layer (Subcutaneous Tissue): Yes Fascia: No Exposed Structures: Fascia: No Fascia: No Fat Layer (Subcutaneous Tissue): No Tendon: No Tendon: No Tendon: No Muscle: No Muscle: No Muscle: No Joint: No Joint: No Joint: No Bone: No Bone: No Bone: No Small (1-33%) Small (1-33%) Small (1-33%) Epithelialization: Chemical/Enzymatic/Mechanical N/A N/A Debridement: N/A N/A N/A Instrument: None N/A N/A Bleeding: Debridement Treatment Response: Procedure was tolerated well N/A N/A Post Debridement Measurements L x 0.5x0.4x0.1 N/A N/A W x D (cm) 0.016 N/A N/A Post Debridement Volume: (cm) Scarring: Yes Scarring: Yes Scarring: Yes Periwound Skin Texture: Excoriation: No Excoriation: No Excoriation: No Induration: No Induration: No Induration: No Callus: No Callus: No Callus: No Crepitus: No Crepitus: No Crepitus: No Rash: No Rash: No Rash: No Maceration: No Maceration: No Maceration: No Periwound Skin Moisture: [name]/Scaly: No [name]/Scaly: No [name]/Scaly: No Atrophie Blanche: No Hemosiderin Staining: Yes Hemosiderin Staining: Yes Periwound Skin Color: Cyanosis: No Atrophie Blanche: No Atrophie Blanche: No Ecchymosis: No Cyanosis: No Cyanosis: No Erythema: No Ecchymosis: No Ecchymosis: No Hemosiderin Staining: No Erythema: No Erythema: No Mottled: No Mottled: No Mottled: No Pallor: No Pallor: No Pallor: No Rubor: No Rubor: No Rubor: No No Abnormality No Abnormality No Abnormality Temperature: Debridement N/A N/A Procedures Performed: Treatment Notes Electronic Signature(s) Signed: 07/02/2023 4:37:23 PM By: Geralyn Corwin DO Entered By: Geralyn Corwin on 07/02/2023  14:32:03 -------------------------------------------------------------------------------- Multi-Disciplinary Care Plan Details Patient Name: Date of Service: [name] 07/02/2023 1:30 PM Medical Record Number: [id number] Patient Account Number: [id number] Date of Birth/Sex: Treating RN: [birth year] (89 y.o. [name] Primary Care Karyssa Amaral: [name] Other Clinician: Referring Beyza Bellino: Treating Katrinna Travieso/Extender: Loetta Rough in Treatment: 0 Active Inactive Nutrition Nursing Diagnoses: Potential for alteratiion in Nutrition/Potential for imbalanced nutrition Goals: Patient/caregiver agrees to and verbalizes understanding of need to obtain nutritional consultation Date Initiated: 07/02/2023 Target Resolution Date: 07/12/2023 Goal Status: Active Patient/caregiver will maintain therapeutic glucose control Date Initiated: 07/02/2023 Target Resolution Date: 07/11/2023 BRITTANE, KILLE ([id number]) [phone].pdf Page 6 of 13 Goal Status: Active Interventions: Assess HgA1c results as ordered upon admission and as needed Provide education on elevated blood sugars and impact on wound healing Provide education on nutrition Treatment Activities: Patient referred to Primary Care Physician for further nutritional evaluation : 07/02/2023 Notes: Orientation to the Wound Care Program Nursing Diagnoses: Knowledge deficit related to the wound healing center program Goals: Patient/caregiver will verbalize understanding of the Wound Healing Center Program Date Initiated: 07/02/2023 Target Resolution Date: 07/12/2023 Goal Status: Active Interventions: Provide education on orientation to the wound center Notes: Pain, Acute or Chronic Nursing Diagnoses: Pain, acute or chronic: actual or potential Potential alteration in comfort, pain Goals: Patient will verbalize adequate pain control and receive pain control interventions  during procedures as needed Date Initiated: 07/02/2023 Target Resolution Date: 07/12/2023 Goal Status: Active  Patient/caregiver will verbalize comfort level met Date Initiated: 07/02/2023 Target Resolution Date: 07/11/2023 Goal Status: Active Interventions: Encourage patient to take pain medications as prescribed Provide education on pain management Reposition patient for comfort Treatment Activities: Administer pain control measures as ordered : 07/02/2023 Notes: Wound/Skin Impairment Nursing Diagnoses: Knowledge deficit related to ulceration/compromised skin integrity Goals: Patient/caregiver will verbalize understanding of skin care regimen Date Initiated: 07/02/2023 Target Resolution Date: 07/12/2023 Goal Status: Active Interventions: Assess patient/caregiver ability to perform ulcer/skin care regimen upon admission and as needed Assess ulceration(s) every visit Provide education on ulcer and skin care Treatment Activities: Skin care regimen initiated : 07/02/2023 Topical wound management initiated : 07/02/2023 Notes: Electronic Signature(s) Signed: 07/02/2023 6:21:10 PM By: [name] RN, BSN Entered By: [name] on 07/02/2023 14:03:26 [name] ([id number]) [id number] Page 7 of 13 -------------------------------------------------------------------------------- Pain Assessment Details Patient Name: Date of Service: KENJA, POLKINGHORNE 07/02/2023 1:30 PM Medical Record Number: [id number] Patient Account Number: [id number] Date of Birth/Sex: Treating RN: 17-Jan-1931 (89 y.o. [name] Primary Care Onyx Schirmer: [name] Other Clinician: Referring Loyd Marhefka: Treating Yeimy Brabant/Extender: Loetta Rough in Treatment: 0 Active Problems Location of Pain Severity and Description of Pain Patient Has Paino No Site Locations Pain Management and Medication Current Pain Management: Electronic  Signature(s) Signed: 07/02/2023 6:21:10 PM By: [name] RN, BSN Entered By: [name] on 07/02/2023 14:21:49 -------------------------------------------------------------------------------- Patient/Caregiver Education Details Patient Name: Date of Service: [name] 7/30/2024andnbsp1:30 PM Medical Record Number: [id number] Patient Account Number: [id number] Date of Birth/Gender: Treating RN: June 27, 1931 (89 y.o. [name], [name] Primary Care Physician: [name] Other Clinician: Referring Physician: Treating Physician/Extender: Loetta Rough in Treatment: 0 Education Assessment Education Provided To: Patient Education Topics Provided Welcome T [facility] Packet: o Handouts: Medication Safety, Nutrition, Welcome T [facility] o Methods: Explain/Verbal, Printed Responses: Reinforcements needed SHAM, CORAL ([id number]) [phone].pdf Page 8 of 13 Wound/Skin Impairment: Handouts: Caring for Your Ulcer Methods: Explain/Verbal Responses: Reinforcements needed Electronic Signature(s) Signed: 07/02/2023 6:21:10 PM By: [name] RN, BSN Entered By: [name] on 07/02/2023 14:03:44 -------------------------------------------------------------------------------- Wound Assessment Details Patient Name: Date of Service: [name] 07/02/2023 1:30 PM Medical Record Number: [id number] Patient Account Number: [id number] Date of Birth/Sex: Treating RN: 12/31/1930 (89 y.o. F) Primary Care Ottis Sarnowski: [name] Other Clinician: Referring Luisdaniel Kenton: Treating Dyshon Philbin/Extender: [name]: 0 Wound Status Wound Number: 10 Primary Diabetic Wound/Ulcer of the Lower Extremity Etiology: Wound Location: Right, Medial Upper Leg Secondary Lymphedema Wounding Event: Blister Etiology: Date Acquired: 06/12/2023 Wound  Open Weeks Of Treatment: 0 Status: Clustered Wound: No Comorbid Anemia, Congestive Heart Failure, Hypertension, Peripheral Pending Amputation On Presentation History: Venous Disease, Type II Diabetes, Osteoarthritis, Neuropathy Photos Wound Measurements Length: (cm) 0.5 Width: (cm) 0.4 Depth: (cm) 0.1 Area: (cm) 0.157 Volume: (cm) 0.016 % Reduction in Area: % Reduction in Volume: Epithelialization: Small (1-33%) Tunneling: No Undermining: No Wound Description Classification: Grade 1 Wound Margin: Distinct, outline attached Exudate Amount: Medium Exudate Type: Serosanguineous Exudate Color: red, brown Foul Odor After Cleansing: No Slough/Fibrino Yes Wound Bed Granulation Amount: Small (1-33%) Exposed Structure Granulation Quality: Pink Fascia Exposed: No Necrotic Amount: Large (67-100%) Fat Layer (Subcutaneous Tissue) Exposed: Yes Necrotic Quality: Adherent Slough Tendon Exposed: No Muscle Exposed: No Joint Exposed: No Bone Exposed: No WAHNETA, BAVA G ([id number]) [phone].pdf Page 9 of 13 Periwound Skin Texture Texture Color No Abnormalities Noted: No No Abnormalities Noted: No  Callus: No Atrophie Blanche: No Crepitus: No Cyanosis: No Excoriation: No Ecchymosis: No Induration: No Erythema: No Rash: No Hemosiderin Staining: No Scarring: Yes Mottled: No Pallor: No Moisture Rubor: No No Abnormalities Noted: No [name] / Scaly: No Temperature / Pain Maceration: No Temperature: No Abnormality Treatment Notes Wound #10 (Upper Leg) Wound Laterality: Right, Medial Cleanser Soap and Water Discharge Instruction: May shower and wash wound with dial antibacterial soap and water prior to dressing change. Byram Ancillary Kit - 15 Day Supply Discharge Instruction: Use supplies as instructed; Kit contains: (15) Saline Bullets; (15) 3x3 Gauze; 15 pr Gloves Peri-Wound Care Skin Prep Discharge Instruction: Use skin prep as  directed Topical Primary Dressing Hydrofera Blue Ready Transfer Foam, 2.5x2.5 (in/in) Discharge Instruction: Apply directly to wound bed as directed Secondary Dressing Zetuvit Plus Silicone Border Dressing 4x4 (in/in) Discharge Instruction: Apply silicone border over primary dressing as directed. Secured With Compression Wrap Compression Stockings Facilities manager) Signed: 07/02/2023 6:21:10 PM By: [name] RN, BSN Entered By: [name] on 07/02/2023 14:19:21 -------------------------------------------------------------------------------- Wound Assessment Details Patient Name: Date of Service: DAWNYELL, MURNANE 07/02/2023 1:30 PM Medical Record Number: [id number] Patient Account Number: [id number] Date of Birth/Sex: Treating RN: 04-17-1931 (89 y.o. F) Primary Care Mehar Kirkwood: [name] Other Clinician: Referring Montoya Watkin: Treating Joncarlos Atkison/Extender: [name]: 0 Wound Status Wound Number: 11 Primary Diabetic Wound/Ulcer of the Lower Extremity Etiology: Wound Location: Left, Proximal Lower Leg Secondary Lymphedema Wounding Event: Bite Etiology: Date Acquired: 06/12/2023 Wound Open Weeks Of Treatment: 0 Status: Clustered Wound: Yes Comorbid Anemia, Congestive Heart Failure, Hypertension, Peripheral JONINE, PEABODY G ([id number]) [phone].pdf Page 10 of 13 History: Venous Disease, Type II Diabetes, Osteoarthritis, Neuropathy Photos Wound Measurements Length: (cm) Width: (cm) Depth: (cm) Clustered Quantity: Area: (cm) Volume: (cm) 2 % Reduction in Area: 1.7 % Reduction in Volume: 0.1 Epithelialization: Small (1-33%) 2 Tunneling: No 2.67 Undermining: No 0.267 Wound Description Classification: Grade 1 Wound Margin: Distinct, outline attached Exudate Amount: Medium Exudate Type: Serosanguineous Exudate Color: red, brown Foul Odor After Cleansing: No Slough/Fibrino  Yes Wound Bed Granulation Amount: Large (67-100%) Exposed Structure Granulation Quality: Red Fascia Exposed: No Necrotic Amount: Small (1-33%) Fat Layer (Subcutaneous Tissue) Exposed: Yes Necrotic Quality: Adherent Slough Tendon Exposed: No Muscle Exposed: No Joint Exposed: No Bone Exposed: No Periwound Skin Texture Texture Color No Abnormalities Noted: No No Abnormalities Noted: No Callus: No Atrophie Blanche: No Crepitus: No Cyanosis: No Excoriation: No Ecchymosis: No Induration: No Erythema: No Rash: No Hemosiderin Staining: Yes Scarring: Yes Mottled: No Pallor: No Moisture Rubor: No No Abnormalities Noted: No [name] / Scaly: No Temperature / Pain Maceration: No Temperature: No Abnormality Treatment Notes Wound #11 (Lower Leg) Wound Laterality: Left, Proximal Cleanser Soap and Water Discharge Instruction: May shower and wash wound with dial antibacterial soap and water prior to dressing change. Vashe 5.8 (oz) Discharge Instruction: Cleanse the wound with Vashe prior to applying a clean dressing using gauze sponges, not tissue or cotton balls. Peri-Wound Care Sween Lotion (Moisturizing lotion) Discharge Instruction: Apply moisturizing lotion as directed Topical Gentamicin Discharge Instruction: As directed by physician Mupirocin Ointment Discharge Instruction: Apply Mupirocin (Bactroban) as instructed MITSUKO, MASTROGIOVANNI ([id number]) [id number] Page 11 of 13 Primary Dressing Hydrofera Blue Ready Transfer Foam, 2.5x2.5 (in/in) Discharge Instruction: Apply directly to wound bed as directed Secondary Dressing ABD Pad, 8x10 Discharge Instruction: Apply over primary dressing as directed. Woven Gauze Sponge, Non-Sterile 4x4 in Discharge Instruction: Apply over primary dressing as directed. Zetuvit Plus Silicone Border Dressing  4x4 (in/in) Discharge Instruction: Apply silicone border over primary dressing as directed. Secured  With Compression Wrap Kerlix Roll 4.5x3.1 (in/yd) Discharge Instruction: Apply Kerlix and Coban compression as directed. *****Roland Rack BOOT first layer apply to upper portion of lower leg. Coban Self-Adherent Wrap 4x5 (in/yd) Discharge Instruction: Apply over Kerlix as directed. Compression Stockings Add-Ons Electronic Signature(s) Signed: 07/02/2023 6:21:10 PM By: [name] RN, BSN Entered By: [name] on 07/02/2023 14:20:35 -------------------------------------------------------------------------------- Wound Assessment Details Patient Name: Date of Service: DARRIELL, DEMORY 07/02/2023 1:30 PM Medical Record Number: [id number] Patient Account Number: [id number] Date of Birth/Sex: Treating RN: 28-Jan-1931 (89 y.o. F) Primary Care Marris Frontera: [name] Other Clinician: Referring Maryem Shuffler: Treating Enid Maultsby/Extender: [name]: 0 Wound Status Wound Number: 12 Primary Diabetic Wound/Ulcer of the Lower Extremity Etiology: Wound Location: Left, Distal, Anterior Lower Leg Secondary Lymphedema Wounding Event: Blister Etiology: Date Acquired: 06/12/2023 Wound Open Weeks Of Treatment: 0 Status: Clustered Wound: No Comorbid Anemia, Congestive Heart Failure, Hypertension, Peripheral History: Venous Disease, Type II Diabetes, Osteoarthritis, Neuropathy Photos Wound Measurements Length: (cm) 2.3 Width: (cm) 2.1 Depth: (cm) 0.1 Lenhard, Rajanae G ([id number]) Area: (cm) 3.793 Volume: (cm) 0.379 % Reduction in Area: % Reduction in Volume: Epithelialization: Small (1-33%) [phone].pdf Page 12 of 13 Tunneling: No Undermining: No Wound Description Classification: Grade 1 Wound Margin: Distinct, outline attached Exudate Amount: Medium Exudate Type: Serosanguineous Exudate Color: red, brown Foul Odor After Cleansing: No Slough/Fibrino Yes Wound Bed Granulation Amount: Small (1-33%) Exposed  Structure Granulation Quality: Pink, Pale Fascia Exposed: No Necrotic Amount: Large (67-100%) Fat Layer (Subcutaneous Tissue) Exposed: No Necrotic Quality: Adherent Slough Tendon Exposed: No Muscle Exposed: No Joint Exposed: No Bone Exposed: No Periwound Skin Texture Texture Color No Abnormalities Noted: No No Abnormalities Noted: No Callus: No Atrophie Blanche: No Crepitus: No Cyanosis: No Excoriation: No Ecchymosis: No Induration: No Erythema: No Rash: No Hemosiderin Staining: Yes Scarring: Yes Mottled: No Pallor: No Moisture Rubor: No No Abnormalities Noted: No [name] / Scaly: No Temperature / Pain Maceration: No Temperature: No Abnormality Treatment Notes Wound #12 (Lower Leg) Wound Laterality: Left, Anterior, Distal Cleanser Soap and Water Discharge Instruction: May shower and wash wound with dial antibacterial soap and water prior to dressing change. Vashe 5.8 (oz) Discharge Instruction: Cleanse the wound with Vashe prior to applying a clean dressing using gauze sponges, not tissue or cotton balls. Peri-Wound Care Sween Lotion (Moisturizing lotion) Discharge Instruction: Apply moisturizing lotion as directed Topical Gentamicin Discharge Instruction: As directed by physician Mupirocin Ointment Discharge Instruction: Apply Mupirocin (Bactroban) as instructed Primary Dressing Hydrofera Blue Ready Transfer Foam, 2.5x2.5 (in/in) Discharge Instruction: Apply directly to wound bed as directed Secondary Dressing ABD Pad, 8x10 Discharge Instruction: Apply over primary dressing as directed. Woven Gauze Sponge, Non-Sterile 4x4 in Discharge Instruction: Apply over primary dressing as directed. Zetuvit Plus Silicone Border Dressing 4x4 (in/in) Discharge Instruction: Apply silicone border over primary dressing as directed. Secured With Compression Wrap Kerlix Roll 4.5x3.1 (in/yd) Discharge Instruction: Apply Kerlix and Coban compression as directed. *****Roland Rack BOOT  first layer apply to upper portion of lower leg. Coban Self-Adherent Wrap 4x5 (in/yd) Discharge Instruction: Apply over Kerlix as directed. SALIHA, MUNZER ([id number]) [id number] Page 13 of 13 Compression Stockings Add-Ons Electronic Signature(s) Signed: 07/02/2023 6:21:10 PM By: [name] RN, BSN Entered By: [name] on 07/02/2023 14:21:23 -------------------------------------------------------------------------------- Vitals Details Patient Name: Date of Service: [name] 07/02/2023 1:30 PM Medical Record Number: [id number] Patient Account Number: [id number] Date of Birth/Sex: Treating RN: 1931-05-11 (89  y.o. F) Primary Care Simya Tercero: [name] Other Clinician: Referring Karis Rilling: Treating Jaman Aro/Extender: Loetta Rough in Treatment: 0 Vital Signs Time Taken: 13:50 Temperature (F): 98 Height (in): 62 Pulse (bpm): 71 Source: Stated Respiratory Rate (breaths/min): 18 Weight (lbs): 220 Blood Pressure (mmHg): 106/68 Source: Stated Capillary Blood Glucose (mg/dl): 295 Body Mass Index (BMI): 40.2 Reference Range: 80 - 120 mg / dl Electronic Signature(s) Signed: 07/02/2023 4:56:42 PM By: Thayer Dallas Entered By: Thayer Dallas on 07/02/2023 13:51:35

## 2023-07-05 ENCOUNTER — Encounter (HOSPITAL_BASED_OUTPATIENT_CLINIC_OR_DEPARTMENT_OTHER): Payer: Medicare PPO | Attending: Internal Medicine | Admitting: Internal Medicine

## 2023-07-05 DIAGNOSIS — I5042 Chronic combined systolic (congestive) and diastolic (congestive) heart failure: Secondary | ICD-10-CM | POA: Insufficient documentation

## 2023-07-05 DIAGNOSIS — E1140 Type 2 diabetes mellitus with diabetic neuropathy, unspecified: Secondary | ICD-10-CM | POA: Diagnosis not present

## 2023-07-05 DIAGNOSIS — L97822 Non-pressure chronic ulcer of other part of left lower leg with fat layer exposed: Secondary | ICD-10-CM | POA: Diagnosis not present

## 2023-07-05 DIAGNOSIS — M1990 Unspecified osteoarthritis, unspecified site: Secondary | ICD-10-CM | POA: Diagnosis not present

## 2023-07-05 DIAGNOSIS — E785 Hyperlipidemia, unspecified: Secondary | ICD-10-CM | POA: Insufficient documentation

## 2023-07-05 DIAGNOSIS — I890 Lymphedema, not elsewhere classified: Secondary | ICD-10-CM | POA: Insufficient documentation

## 2023-07-05 DIAGNOSIS — E11622 Type 2 diabetes mellitus with other skin ulcer: Secondary | ICD-10-CM | POA: Insufficient documentation

## 2023-07-05 DIAGNOSIS — E1151 Type 2 diabetes mellitus with diabetic peripheral angiopathy without gangrene: Secondary | ICD-10-CM | POA: Insufficient documentation

## 2023-07-05 DIAGNOSIS — K219 Gastro-esophageal reflux disease without esophagitis: Secondary | ICD-10-CM | POA: Diagnosis not present

## 2023-07-05 DIAGNOSIS — I110 Hypertensive heart disease with heart failure: Secondary | ICD-10-CM | POA: Diagnosis not present

## 2023-07-05 DIAGNOSIS — L97812 Non-pressure chronic ulcer of other part of right lower leg with fat layer exposed: Secondary | ICD-10-CM | POA: Insufficient documentation

## 2023-07-05 NOTE — Progress Notes (Signed)
Katherine, [name] ([id number]) [phone].pdf Page 1 of 8 Visit Report for 07/05/2023 Arrival Information Details Patient Name: Date of Service: [name], [name] 07/05/2023 11:30 A M Medical Record Number: [id number] Patient Account Number: [id number] Date of Birth/Sex: Treating RN: 21-Jan-1931 (89 y.o. Woodroe Mode, Randa Evens Primary Care Raymel Cull: [name] Other Clinician: Referring Cash Meadow: Treating Tanganika Barradas/Extender: Loetta Rough in Treatment: 0 Visit Information History Since Last Visit Added or deleted any medications: No Patient Arrived: [name] Any new allergies or adverse reactions: No Arrival Time: 11:25 Had a fall or experienced change in No Accompanied By: daughter/Caregiver activities of daily living that may affect Transfer Assistance: None risk of falls: Patient Identification Verified: Yes Signs or symptoms of abuse/neglect since last visito No Patient Requires Transmission-Based Precautions: No Hospitalized since last visit: No Patient Has Alerts: No Implantable device outside of the clinic excluding No cellular tissue based products placed in the center since last visit: Has Dressing in Place as Prescribed: Yes Pain Present Now: No Electronic Signature(s) Signed: 07/05/2023 12:14:04 PM By: Karie Schwalbe RN Entered By: Karie Schwalbe on 07/05/2023 11:47:22 -------------------------------------------------------------------------------- Clinic Level of Care Assessment Details Patient Name: Date of Service: Katherine, [name] 07/05/2023 11:30 A M Medical Record Number: [id number] Patient Account Number: [id number] Date of Birth/Sex: Treating RN: 10-Mar-1931 (89 y.o. F) Primary Care Elynore Dolinski: [name] Other Clinician: Thayer Dallas Referring Lelynd Poer: Treating Kani Chauvin/Extender: Loetta Rough in Treatment: 0 Clinic Level of Care Assessment Items TOOL 4 Quantity  Score X- 1 0 Use when only an EandM is performed on FOLLOW-UP visit ASSESSMENTS - Nursing Assessment / Reassessment X- 1 10 Reassessment of Co-morbidities (includes updates in patient status) X- 1 5 Reassessment of Adherence to Treatment Plan ASSESSMENTS - Wound and Skin A ssessment / Reassessment []  - 0 Simple Wound Assessment / Reassessment - one wound []  - 0 Complex Wound Assessment / Reassessment - multiple wounds []  - 0 Dermatologic / Skin Assessment (not related to wound area) ASSESSMENTS - Focused Assessment []  - 0 Circumferential Edema Measurements - multi extremities []  - 0 Nutritional Assessment / Counseling / Intervention KIALEE, [name] ([id number]) [id number] Page 2 of 8 []  - 0 Lower Extremity Assessment (monofilament, tuning fork, pulses) []  - 0 Peripheral Arterial Disease Assessment (using hand held doppler) ASSESSMENTS - Ostomy and/or Continence Assessment and Care []  - 0 Incontinence Assessment and Management []  - 0 Ostomy Care Assessment and Management (repouching, etc.) PROCESS - Coordination of Care X - Simple Patient / Family Education for ongoing care 1 15 []  - 0 Complex (extensive) Patient / Family Education for ongoing care []  - 0 Staff obtains Chiropractor, Records, T Results / Process Orders est []  - 0 Staff telephones HHA, Nursing Homes / Clarify orders / etc []  - 0 Routine Transfer to another Facility (non-emergent condition) []  - 0 Routine Hospital Admission (non-emergent condition) []  - 0 New Admissions / Manufacturing engineer / Ordering NPWT Apligraf, etc. , []  - 0 Emergency Hospital Admission (emergent condition) X- 1 10 Simple Discharge Coordination []  - 0 Complex (extensive) Discharge Coordination PROCESS - Special Needs []  - 0 Pediatric / Minor Patient Management []  - 0 Isolation Patient Management []  - 0 Hearing / Language / Visual special needs []  - 0 Assessment of Community assistance  (transportation, D/C planning, etc.) []  - 0 Additional assistance / Altered mentation []  - 0 Support Surface(s) Assessment (bed, cushion, seat, etc.) INTERVENTIONS - Wound Cleansing / Measurement []  - 0 Simple Wound Cleansing - one  wound X- 3 5 Complex Wound Cleansing - multiple wounds []  - 0 Wound Imaging (photographs - any number of wounds) []  - 0 Wound Tracing (instead of photographs) []  - 0 Simple Wound Measurement - one wound []  - 0 Complex Wound Measurement - multiple wounds INTERVENTIONS - Wound Dressings []  - 0 Small Wound Dressing one or multiple wounds X- 1 15 Medium Wound Dressing one or multiple wounds X- 2 20 Large Wound Dressing one or multiple wounds []  - 0 Application of Medications - topical []  - 0 Application of Medications - injection INTERVENTIONS - Miscellaneous []  - 0 External ear exam []  - 0 Specimen Collection (cultures, biopsies, blood, body fluids, etc.) []  - 0 Specimen(s) / Culture(s) sent or taken to Lab for analysis []  - 0 Patient Transfer (multiple staff / Nurse, adult / Similar devices) []  - 0 Simple Staple / Suture removal (25 or less) []  - 0 Complex Staple / Suture removal (26 or more) []  - 0 Hypo / Hyperglycemic Management (close monitor of Blood Glucose) Blowe, Erilyn G ([id number]) [id number] Page 3 of 8 []  - 0 Ankle / Brachial Index (ABI) - do not check if billed separately []  - 0 Vital Signs Has the patient been seen at the hospital within the last three years: Yes Total Score: 110 Level Of Care: New/Established - Level 3 Electronic Signature(s) Signed: 07/05/2023 11:53:17 AM By: Thayer Dallas Entered By: Thayer Dallas on 07/05/2023 11:50:24 -------------------------------------------------------------------------------- Encounter Discharge Information Details Patient Name: Date of Service: [name] 07/05/2023 11:30 A M Medical Record Number: [id number] Patient Account Number:  [id number] Date of Birth/Sex: Treating RN: [birth year] (89 y.o. F) Primary Care Fatiha Guzy: [name] Other Clinician: Thayer Dallas Referring Mackenzie Lia: Treating Terron Merfeld/Extender: Loetta Rough in Treatment: 0 Encounter Discharge Information Items Discharge Condition: Stable Ambulatory Status: Walker Discharge Destination: Home Transportation: Private Auto Accompanied By: friend Schedule Follow-up Appointment: Yes Clinical Summary of Care: Electronic Signature(s) Signed: 07/05/2023 11:53:17 AM By: Thayer Dallas Entered By: Thayer Dallas on 07/05/2023 11:52:15 -------------------------------------------------------------------------------- Patient/Caregiver Education Details Patient Name: Date of Service: [name] 8/2/2024andnbsp11:30 A M Medical Record Number: [id number] Patient Account Number: [id number] Date of Birth/Gender: Treating RN: Oct 16, 1931 (89 y.o. F) Primary Care Physician: [name] Other Clinician: Thayer Dallas Referring Physician: Treating Physician/Extender: Loetta Rough in Treatment: 0 Education Assessment Education Provided To: Patient Education Topics Provided Wound/Skin Impairment: Methods: Explain/Verbal Responses: Return demonstration correctly Electronic Signature(s) Signed: 07/05/2023 12:14:04 PM By: Karie Schwalbe RN Entered By: Karie Schwalbe on 07/05/2023 11:51:03 [name] ([id number]) [id number] Page 4 of 8 -------------------------------------------------------------------------------- Wound Assessment Details Patient Name: Date of Service: SARABELLA, CAPRIO 07/05/2023 11:30 A M Medical Record Number: [id number] Patient Account Number: [id number] Date of Birth/Sex: Treating RN: 01/11/1931 (89 y.o. Katrinka Blazing Primary Care Lyn Deemer: [name] Other Clinician: Referring Myrikal Messmer: Treating  Kimberli Winne/Extender: [name]: 0 Wound Status Wound Number: 10 Primary Diabetic Wound/Ulcer of the Lower Extremity Etiology: Wound Location: Right, Medial Upper Leg Secondary Lymphedema Wounding Event: Blister Etiology: Date Acquired: 06/12/2023 Wound Open Weeks Of Treatment: 0 Status: Clustered Wound: No Comorbid Anemia, Congestive Heart Failure, Hypertension, Peripheral Pending Amputation On Presentation History: Venous Disease, Type II Diabetes, Osteoarthritis, Neuropathy Wound Measurements Length: (cm) 0.5 Width: (cm) 0.4 Depth: (cm) 0.1 Area: (cm) 0.157 Volume: (cm) 0.016 % Reduction in Area: 0% % Reduction in Volume: 0% Epithelialization: Small (1-33%) Tunneling: No Undermining: No Wound Description Classification: Grade 1 Wound Margin:  Distinct, outline attached Exudate Amount: Medium Exudate Type: Serosanguineous Exudate Color: red, brown Foul Odor After Cleansing: No Slough/Fibrino Yes Wound Bed Granulation Amount: Small (1-33%) Exposed Structure Granulation Quality: Pink Fascia Exposed: No Necrotic Amount: Large (67-100%) Fat Layer (Subcutaneous Tissue) Exposed: Yes Necrotic Quality: Adherent Slough Tendon Exposed: No Muscle Exposed: No Joint Exposed: No Bone Exposed: No Periwound Skin Texture Texture Color No Abnormalities Noted: No No Abnormalities Noted: No Callus: No Atrophie Blanche: No Crepitus: No Cyanosis: No Excoriation: No Ecchymosis: No Induration: No Erythema: No Rash: No Hemosiderin Staining: No Scarring: Yes Mottled: No Pallor: No Moisture Rubor: No No Abnormalities Noted: No Dry / Scaly: No Temperature / Pain Maceration: No Temperature: No Abnormality Treatment Notes Wound #10 (Upper Leg) Wound Laterality: Right, Medial Cleanser Soap and Water Discharge Instruction: May shower and wash wound with dial antibacterial soap and water prior to dressing change. [employer] Ancillary Kit  - 15 Day Supply Discharge Instruction: Use supplies as instructed; Kit contains: (15) Saline Bullets; (15) 3x3 Gauze; 15 pr Gloves Ojo, Onalee G ([id number]) [phone].pdf Page 5 of 8 Peri-Wound Care Skin Prep Discharge Instruction: Use skin prep as directed Topical Primary Dressing Hydrofera Blue Ready Transfer Foam, 2.5x2.5 (in/in) Discharge Instruction: Apply directly to wound bed as directed Secondary Dressing Zetuvit Plus Silicone Border Dressing 4x4 (in/in) Discharge Instruction: Apply silicone border over primary dressing as directed. Secured With Compression Wrap Compression Stockings Facilities manager) Signed: 07/05/2023 12:14:04 PM By: Karie Schwalbe RN Entered By: Karie Schwalbe on 07/05/2023 11:47:53 -------------------------------------------------------------------------------- Wound Assessment Details Patient Name: Date of Service: PASQUALINA, COLASURDO 07/05/2023 11:30 A M Medical Record Number: [id number] Patient Account Number: [id number] Date of Birth/Sex: Treating RN: 1931-02-20 (89 y.o. Katrinka Blazing Primary Care Lior Cartelli: [name] Other Clinician: Referring Mackena Plummer: Treating Tane Biegler/Extender: [name]: 0 Wound Status Wound Number: 11 Primary Diabetic Wound/Ulcer of the Lower Extremity Etiology: Wound Location: Left, Proximal Lower Leg Secondary Lymphedema Wounding Event: Bite Etiology: Date Acquired: 06/12/2023 Wound Open Weeks Of Treatment: 0 Status: Clustered Wound: Yes Comorbid Anemia, Congestive Heart Failure, Hypertension, Peripheral History: Venous Disease, Type II Diabetes, Osteoarthritis, Neuropathy Wound Measurements Length: (cm) Width: (cm) Depth: (cm) Clustered Quantity: Area: (cm) Volume: (cm) 2 % Reduction in Area: 0% 1.7 % Reduction in Volume: 0% 0.1 Epithelialization: Small (1-33%) 2 Tunneling: No 2.67 Undermining:  No 0.267 Wound Description Classification: Grade 1 Wound Margin: Distinct, outline attached Exudate Amount: Medium Exudate Type: Serosanguineous Exudate Color: red, brown Foul Odor After Cleansing: No Slough/Fibrino Yes Wound Bed Granulation Amount: Large (67-100%) Exposed Structure Granulation Quality: Red Fascia Exposed: No Necrotic Amount: Small (1-33%) Fat Layer (Subcutaneous Tissue) Exposed: Yes Necrotic Quality: Adherent Slough Tendon Exposed: No Muscle Exposed: No Joint Exposed: No Bone Exposed: No SHRITA, THIEN G ([id number]) [id number] Page 6 of 8 Periwound Skin Texture Texture Color No Abnormalities Noted: No No Abnormalities Noted: No Callus: No Atrophie Blanche: No Crepitus: No Cyanosis: No Excoriation: No Ecchymosis: No Induration: No Erythema: No Rash: No Hemosiderin Staining: Yes Scarring: Yes Mottled: No Pallor: No Moisture Rubor: No No Abnormalities Noted: No Dry / Scaly: No Temperature / Pain Maceration: No Temperature: No Abnormality Treatment Notes Wound #11 (Lower Leg) Wound Laterality: Left, Proximal Cleanser Soap and Water Discharge Instruction: May shower and wash wound with dial antibacterial soap and water prior to dressing change. Vashe 5.8 (oz) Discharge Instruction: Cleanse the wound with Vashe prior to applying a clean dressing using gauze sponges, not tissue or cotton balls. Peri-Wound Care Sween Lotion (  Moisturizing lotion) Discharge Instruction: Apply moisturizing lotion as directed Topical Gentamicin Discharge Instruction: As directed by physician Mupirocin Ointment Discharge Instruction: Apply Mupirocin (Bactroban) as instructed Primary Dressing Hydrofera Blue Ready Transfer Foam, 2.5x2.5 (in/in) Discharge Instruction: Apply directly to wound bed as directed Secondary Dressing ABD Pad, 8x10 Discharge Instruction: Apply over primary dressing as directed. Woven Gauze Sponge, Non-Sterile 4x4  in Discharge Instruction: Apply over primary dressing as directed. Zetuvit Plus Silicone Border Dressing 4x4 (in/in) Discharge Instruction: Apply silicone border over primary dressing as directed. Secured With Compression Wrap Kerlix Roll 4.5x3.1 (in/yd) Discharge Instruction: Apply Kerlix and Coban compression as directed. *****Roland Rack BOOT first layer apply to upper portion of lower leg. Coban Self-Adherent Wrap 4x5 (in/yd) Discharge Instruction: Apply over Kerlix as directed. Compression Stockings Add-Ons Electronic Signature(s) Signed: 07/05/2023 12:14:04 PM By: Karie Schwalbe RN Entered By: Karie Schwalbe on 07/05/2023 11:48:12 Wound Assessment Details -------------------------------------------------------------------------------- [name] ([id number]) [id number] Page 7 of 8 Patient Name: Date of Service: ALISAH, GRANDBERRY 07/05/2023 11:30 A M Medical Record Number: [id number] Patient Account Number: [id number] Date of Birth/Sex: Treating RN: 11/16/1931 (89 y.o. Katrinka Blazing Primary Care Hajar Penninger: [name] Other Clinician: Referring Jerrik Housholder: Treating Colleen Kotlarz/Extender: [name]: 0 Wound Status Wound Number: 12 Primary Diabetic Wound/Ulcer of the Lower Extremity Etiology: Wound Location: Left, Distal, Anterior Lower Leg Secondary Lymphedema Wounding Event: Blister Etiology: Date Acquired: 06/12/2023 Wound Open Weeks Of Treatment: 0 Status: Clustered Wound: No Comorbid Anemia, Congestive Heart Failure, Hypertension, Peripheral History: Venous Disease, Type II Diabetes, Osteoarthritis, Neuropathy Wound Measurements Length: (cm) 2.3 % Reduction in Area: 0% Width: (cm) 2.1 % Reduction in Volume: 0% Depth: (cm) 0.1 Epithelialization: Small (1-33%) Area: (cm) 3.793 Tunneling: No Volume: (cm) 0.379 Undermining: No Wound Description Classification: Grade 1 Foul Odor After  Cleansing: No Wound Margin: Distinct, outline attached Slough/Fibrino Yes Exudate Amount: Medium Exudate Type: Serosanguineous Exudate Color: red, brown Wound Bed Granulation Amount: Small (1-33%) Exposed Structure Granulation Quality: Pink, Pale Fascia Exposed: No Necrotic Amount: Large (67-100%) Fat Layer (Subcutaneous Tissue) Exposed: No Necrotic Quality: Adherent Slough Tendon Exposed: No Muscle Exposed: No Joint Exposed: No Bone Exposed: No Periwound Skin Texture Texture Color No Abnormalities Noted: No No Abnormalities Noted: No Callus: No Atrophie Blanche: No Crepitus: No Cyanosis: No Excoriation: No Ecchymosis: No Induration: No Erythema: No Rash: No Hemosiderin Staining: Yes Scarring: Yes Mottled: No Pallor: No Moisture Rubor: No No Abnormalities Noted: No Dry / Scaly: No Temperature / Pain Maceration: No Temperature: No Abnormality Treatment Notes Wound #12 (Lower Leg) Wound Laterality: Left, Anterior, Distal Cleanser Soap and Water Discharge Instruction: May shower and wash wound with dial antibacterial soap and water prior to dressing change. Vashe 5.8 (oz) Discharge Instruction: Cleanse the wound with Vashe prior to applying a clean dressing using gauze sponges, not tissue or cotton balls. Peri-Wound Care Sween Lotion (Moisturizing lotion) Discharge Instruction: Apply moisturizing lotion as directed Topical Gentamicin Discharge Instruction: As directed by physician Mupirocin Ointment Discharge Instruction: Apply Mupirocin (Bactroban) as instructed MENDE, BISWELL ([id number]) [id number].pdf Page 8 of 8 Primary Dressing Hydrofera Blue Ready Transfer Foam, 2.5x2.5 (in/in) Discharge Instruction: Apply directly to wound bed as directed Secondary Dressing ABD Pad, 8x10 Discharge Instruction: Apply over primary dressing as directed. Woven Gauze Sponge, Non-Sterile 4x4 in Discharge Instruction: Apply over primary dressing as  directed. Zetuvit Plus Silicone Border Dressing 4x4 (in/in) Discharge Instruction: Apply silicone border over primary dressing as directed. Secured With Compression Wrap Kerlix Roll 4.5x3.1 (in/yd) Discharge Instruction: Apply  Kerlix and Coban compression as directed. *****Roland Rack BOOT first layer apply to upper portion of lower leg. Coban Self-Adherent Wrap 4x5 (in/yd) Discharge Instruction: Apply over Kerlix as directed. Compression Stockings Add-Ons Electronic Signature(s) Signed: 07/05/2023 12:14:04 PM By: Karie Schwalbe RN Entered By: Karie Schwalbe on 07/05/2023 11:48:49 -------------------------------------------------------------------------------- Vitals Details Patient Name: Date of Service: [name] 07/05/2023 11:30 A M Medical Record Number: [id number] Patient Account Number: [id number] Date of Birth/Sex: Treating RN: 08-11-1931 (89 y.o. Katrinka Blazing Primary Care Deondra Labrador: [name] Other Clinician: Referring Amily Depp: Treating Tranquilino Fischler/Extender: [name]: 0 Vital Signs Time Taken: 11:26 Respiratory Rate (breaths/min): 18 Height (in): 62 Reference Range: 80 - 120 mg / dl Weight (lbs): 409 Body Mass Index (BMI): 40.2 Electronic Signature(s) Signed: 07/05/2023 12:14:04 PM By: Karie Schwalbe RN Entered By: Karie Schwalbe on 07/05/2023 11:47:32

## 2023-07-08 NOTE — Progress Notes (Signed)
HOLLI, LESNICK ([id number]) [id number] Page 1 of 1 Visit Report for 07/05/2023 SuperBill Details Patient Name: Date of Service: [name], Katherine 07/05/2023 Medical Record Number: [id number] Patient Account Number: [id number] Date of Birth/Sex: Treating RN: [birth year] (89 y.o. Katrinka Blazing Primary Care Provider: [name] Other Clinician: Referring Provider: Treating Provider/Extender: Loetta Rough in Treatment: 0 Diagnosis Coding ICD-10 Codes Code Description [phone] Non-pressure chronic ulcer of other part of right lower leg with fat layer exposed L97.822 Non-pressure chronic ulcer of other part of left lower leg with fat layer exposed I89.0 Lymphedema, not elsewhere classified E11.622 Type 2 diabetes mellitus with other skin ulcer I50.42 Chronic combined systolic (congestive) and diastolic (congestive) heart failure Facility Procedures CPT4 Code Description Modifier Quantity [id number] [phone] - WOUND CARE VISIT-LEV 3 EST PT 1 Electronic Signature(s) Signed: 07/05/2023 12:14:04 PM By: Karie Schwalbe RN Signed: 07/08/2023 4:29:36 PM By: Geralyn Corwin DO Entered By: Karie Schwalbe on 07/05/2023 11:51:28

## 2023-07-09 ENCOUNTER — Encounter (HOSPITAL_BASED_OUTPATIENT_CLINIC_OR_DEPARTMENT_OTHER): Payer: Medicare PPO | Admitting: Internal Medicine

## 2023-07-09 DIAGNOSIS — E11622 Type 2 diabetes mellitus with other skin ulcer: Secondary | ICD-10-CM

## 2023-07-09 DIAGNOSIS — L97822 Non-pressure chronic ulcer of other part of left lower leg with fat layer exposed: Secondary | ICD-10-CM

## 2023-07-09 DIAGNOSIS — I890 Lymphedema, not elsewhere classified: Secondary | ICD-10-CM

## 2023-07-09 DIAGNOSIS — L97812 Non-pressure chronic ulcer of other part of right lower leg with fat layer exposed: Secondary | ICD-10-CM | POA: Diagnosis not present

## 2023-07-09 NOTE — Progress Notes (Signed)
SHERRA, SCARPONE ([id number]) [id number] Page 1 of 8 Visit Report for 07/09/2023 Chief Complaint Document Details Patient Name: Date of Service: [name], [name] 07/09/2023 2:00 PM Medical Record Number: [id number] Patient Account Number: [id number] Date of Birth/Sex: Treating RN: [name], 1931 (89 y.o. F) Primary Care Provider: [name] Other Clinician: Referring Provider: Treating Provider/Extender: Loetta Rough in Treatment: 1 Information Obtained from: Patient Chief Complaint 07/02/2023; bilateral lower extremity wounds Electronic Signature(s) Signed: 07/09/2023 5:13:05 PM By: Geralyn Corwin DO Entered By: Geralyn Corwin on 07/09/2023 14:55:57 -------------------------------------------------------------------------------- HPI Details Patient Name: Date of Service: [name] 07/09/2023 2:00 PM Medical Record Number: [id number] Patient Account Number: [id number] Date of Birth/Sex: Treating RN: August 01, 1931 (89 y.o. 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 She is not quite sure how the right upper leg wound started. The left lower extremity wound started out as blisters. She has compression stockings but has not been wearing them. She takes Lasix 20 mg daily. She currently denies signs of infection. In office ABIs were 0.57 on the left however dorsalis pedal pulses and posterior tibialis pulses were heard on Doppler. 8/6; patient presents for  follow-up. We have been using antibiotic ointment with Hydrofera Blue under Kerlix/Coban to the left lower extremity. Hydrofera Blue to the right lower leg. She came in for nurse visit and wrap change. She reports no issues. She reports that she is scheduled for her ABIs next week. Electronic Signature(s) Signed: 07/09/2023 5:13:05 PM By: Geralyn Corwin DO Entered By: Geralyn Corwin on 07/09/2023 14:57:15 -------------------------------------------------------------------------------- Physical Exam Details Patient Name: Date of Service: [name] 07/09/2023 2:00 PM Medical Record Number: [id number] Patient Account Number: [id number] [name], [name] ([id number]) [phone].pdf Page 2 of 8 Date of Birth/Sex: Treating RN: February 04, 1931 (89 y.o. F) Primary Care Provider: Other Clinician: [name] Referring Provider: Treating Provider/Extender: Loetta Rough in Treatment: 1 Constitutional respirations regular, non-labored and within target range for patient.. Cardiovascular 2+ dorsalis pedis/posterior tibialis pulses. Psychiatric pleasant and cooperative. Notes Right lower extremity: T the inner medial upper leg where the knee bends there is an open wound with granulation tissue and fibrotic tissue. Left lower o extremity: T the anterior aspect there are 3 open wounds with fibrinous tissue and granulation tissue. Good edema control to the left lower extremity. o Electronic Signature(s) Signed: 07/09/2023 5:13:05 PM By: Geralyn Corwin DO Entered By: Geralyn Corwin on 07/09/2023 14:58:12 -------------------------------------------------------------------------------- Physician Orders Details Patient Name: Date of Service: [name] 07/09/2023 2:00 PM Medical Record Number: [id number] Patient Account Number: [id number] Date of Birth/Sex: Treating RN: [birth year] (89 y.o. [name] Primary Care  Provider: [name] Other Clinician: Referring Provider: Treating Provider/Extender: Loetta Rough in Treatment: 1 Verbal / Phone Orders: No Diagnosis Coding Follow-up Appointments ppointment in 1 week. - Dr. Mikey Bussing Tuesday 130pm 07/16/2023 Return A Room 8 ppointment in 2 weeks. - Dr .Mikey Bussing Tuesday Return A Room 8 Nurse Visit: - 1130 Friday morning room7 07/05/2023 Anesthetic (In clinic) Topical Lidocaine 4% applied to wound bed Bathing/ Shower/ Hygiene May shower with protection but do not  get wound dressing(s) wet. Protect dressing(s) with water repellant cover (for example, large plastic bag) or a cast cover and may then take shower. Edema Control - Lymphedema / SCD / Other Elevate legs to the level of the heart or above for 30 minutes daily and/or when sitting for 3-4 times a day throughout the day. Avoid standing for long periods of time. Additional Orders / Instructions Follow Nutritious Diet - increase protein Juven Shake 1-2 times daily. Wound Treatment Wound #10 - Upper Leg Wound Laterality: Right, Medial Cleanser: Soap and Water 1 x Per Day/30 Days Discharge Instructions: May shower and wash wound with dial antibacterial soap and water prior to dressing change. Cleanser: Byram Ancillary Kit - 15 Day Supply (Generic) 1 x Per Day/30 Days Discharge Instructions: Use supplies as instructed; Kit contains: (15) Saline Bullets; (15) 3x3 Gauze; 15 pr Gloves Peri-Wound Care: Skin Prep (Generic) 1 x Per Day/30 Days Discharge Instructions: Use skin prep as directed [name], [name] ([id number]) [id number] Page 3 of 8 Prim Dressing: Hydrofera Blue Ready Transfer Foam, 2.5x2.5 (in/in) (Generic) 1 x Per Day/30 Days ary Discharge Instructions: Apply directly to wound bed as directed Secondary Dressing: Zetuvit Plus Silicone Border Dressing 4x4 (in/in) (Generic) 1 x Per Day/30 Days Discharge Instructions: Apply  silicone border over primary dressing as directed. Wound #11 - Lower Leg Wound Laterality: Left, Proximal Cleanser: Soap and Water 2 x Per Week/30 Days Discharge Instructions: May shower and wash wound with dial antibacterial soap and water prior to dressing change. Cleanser: Vashe 5.8 (oz) 2 x Per Week/30 Days Discharge Instructions: Cleanse the wound with Vashe prior to applying a clean dressing using gauze sponges, not tissue or cotton balls. Peri-Wound Care: Sween Lotion (Moisturizing lotion) 2 x Per Week/30 Days Discharge Instructions: Apply moisturizing lotion as directed Prim Dressing: Hydrofera Blue Ready Transfer Foam, 2.5x2.5 (in/in) 2 x Per Week/30 Days ary Discharge Instructions: Apply directly to wound bed as directed Prim Dressing: Santyl Ointment 2 x Per Week/30 Days ary Discharge Instructions: Apply nickel thick amount to wound bed as instructed Secondary Dressing: ABD Pad, 8x10 2 x Per Week/30 Days Discharge Instructions: Apply over primary dressing as directed. Secondary Dressing: Woven Gauze Sponge, Non-Sterile 4x4 in 2 x Per Week/30 Days Discharge Instructions: Apply over primary dressing as directed. Secondary Dressing: Zetuvit Plus Silicone Border Dressing 4x4 (in/in) 2 x Per Week/30 Days Discharge Instructions: Apply silicone border over primary dressing as directed. Compression Wrap: Kerlix Roll 4.5x3.1 (in/yd) 2 x Per Week/30 Days Discharge Instructions: Apply Kerlix and Coban compression as directed. *****Roland Rack BOOT first layer apply to upper portion of lower leg. Compression Wrap: Coban Self-Adherent Wrap 4x5 (in/yd) 2 x Per Week/30 Days Discharge Instructions: Apply over Kerlix as directed. Wound #12 - Lower Leg Wound Laterality: Left, Anterior, Distal Cleanser: Soap and Water 2 x Per Week/30 Days Discharge Instructions: May shower and wash wound with dial antibacterial soap and water prior to dressing change. Cleanser: Vashe 5.8 (oz) 2 x Per Week/30  Days Discharge Instructions: Cleanse the wound with Vashe prior to applying a clean dressing using gauze sponges, not tissue or cotton balls. Peri-Wound Care: Sween Lotion (Moisturizing lotion) 2 x Per Week/30 Days Discharge Instructions: Apply moisturizing lotion as directed Prim Dressing: Hydrofera Blue Ready Transfer Foam, 2.5x2.5 (in/in) 2 x Per Week/30 Days ary Discharge Instructions: Apply directly to wound bed as directed Prim Dressing: Santyl Ointment 2 x Per Week/30 Days ary Discharge Instructions: Apply nickel thick amount to wound bed as instructed Secondary Dressing: ABD Pad, 8x10  2 x Per Week/30 Days Discharge Instructions: Apply over primary dressing as directed. Secondary Dressing: Woven Gauze Sponge, Non-Sterile 4x4 in 2 x Per Week/30 Days Discharge Instructions: Apply over primary dressing as directed. Secondary Dressing: Zetuvit Plus Silicone Border Dressing 4x4 (in/in) 2 x Per Week/30 Days Discharge Instructions: Apply silicone border over primary dressing as directed. Compression Wrap: Kerlix Roll 4.5x3.1 (in/yd) 2 x Per Week/30 Days Discharge Instructions: Apply Kerlix and Coban compression as directed. *****Roland Rack BOOT first layer apply to upper portion of lower leg. Compression Wrap: Coban Self-Adherent Wrap 4x5 (in/yd) 2 x Per Week/30 Days Discharge Instructions: Apply over Kerlix as directed. Patient Medications llergies: lisinopril A Notifications Medication Indication Start End 07/09/2023 lidocaine DOSE topical 5 % ointment - ointment topical once daily [name], [name] ([id number]) [id number] Page 4 of 8 Electronic Signature(s) Signed: 07/09/2023 5:13:05 PM By: Geralyn Corwin DO Entered By: Geralyn Corwin on 07/09/2023 14:58:21 -------------------------------------------------------------------------------- Problem List Details Patient Name: Date of Service: [name] 07/09/2023 2:00 PM Medical Record Number:  [id number] Patient Account Number: [id number] Date of Birth/Sex: Treating RN: 09-Jan-1931 (89 y.o. F) Primary Care Provider: [name] Other Clinician: Referring Provider: Treating Provider/Extender: Loetta Rough in Treatment: 1 Active Problems ICD-10 Encounter Code Description Active Date MDM Diagnosis [phone] Non-pressure chronic ulcer of other part of right lower leg with fat layer 07/02/2023 No Yes exposed L97.822 Non-pressure chronic ulcer of other part of left lower leg with fat layer exposed7/30/2024 No Yes I89.0 Lymphedema, not elsewhere classified 07/02/2023 No Yes E11.622 Type 2 diabetes mellitus with other skin ulcer 07/02/2023 No Yes I50.42 Chronic combined systolic (congestive) and diastolic (congestive) heart failure 07/02/2023 No Yes Inactive Problems Resolved Problems Electronic Signature(s) Signed: 07/09/2023 5:13:05 PM By: Geralyn Corwin DO Entered By: Geralyn Corwin on 07/09/2023 14:55:40 -------------------------------------------------------------------------------- Progress Note Details Patient Name: Date of Service: [name] 07/09/2023 2:00 PM Medical Record Number: [id number] Patient Account Number: [id number] Date of Birth/Sex: Treating RN: [birth year] (89 y.o. F) Primary Care Provider: [name] Other Clinician: Referring Provider: Treating Provider/Extender: Loetta Rough in Treatment: 1 [name], [name] ([id number]) [id number] Page 5 of 8 Subjective Chief Complaint Information obtained from Patient 07/02/2023; bilateral lower extremity wounds History of Present Illness (HPI) 07/02/2023 Ms. 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 She is not quite sure how the right upper leg wound started. The left lower extremity wound started out as blisters. She has compression stockings but has not been wearing them. She takes Lasix 20 mg daily. She currently denies signs of infection. In office ABIs were 0.57 on the left however dorsalis pedal pulses and posterior tibialis pulses were heard on Doppler. 8/6; patient presents for follow-up. We have been using antibiotic ointment with Hydrofera Blue under Kerlix/Coban to the left lower extremity. Hydrofera Blue to the right lower leg. She came in for nurse visit and wrap change. She reports no issues. She reports that she is scheduled for her ABIs next week. Patient History Medical History Hematologic/Lymphatic Patient has history of Anemia Cardiovascular Patient has history of Congestive Heart Failure, Hypertension, Peripheral Venous Disease Endocrine Patient has history of Type II Diabetes Musculoskeletal Patient has history of Osteoarthritis Neurologic Patient has history of Neuropathy Medical A Surgical History Notes nd Cardiovascular hyperlipidemia, venous stasis  Gastrointestinal GERD Psychiatric generalized anxiety Objective Constitutional respirations regular, non-labored and within target range for patient.. Vitals Time Taken: 2:07 PM, Height: 62 in, Weight: 220 lbs, BMI: 40.2, Respiratory Rate: 18 breaths/min. Cardiovascular 2+ dorsalis pedis/posterior tibialis pulses. Psychiatric pleasant and cooperative. General Notes: Right lower extremity: T the inner medial upper leg where the knee bends there is an open wound with granulation tissue and fibrotic tissue. o Left lower extremity: T the anterior aspect there are 3 open wounds with fibrinous tissue and granulation tissue. Good edema control to the left lower extremity. o Integumentary (Hair, Skin) Wound #10 status is Open. Original cause of wound was Blister. The date acquired was:  06/12/2023. The wound has been in treatment 1 weeks. The wound is located on the Right,Medial Upper Leg. The wound measures 0.4cm length x 0.4cm width x 0.1cm depth; 0.126cm^2 area and 0.013cm^3 volume. There is Fat Layer (Subcutaneous Tissue) exposed. There is no tunneling or undermining noted. There is a medium amount of serosanguineous drainage noted. The wound margin is distinct with the outline attached to the wound base. There is small (1-33%) pink granulation within the wound bed. There is a large (67-100%) amount of necrotic tissue within the wound bed including Adherent Slough. The periwound skin appearance exhibited: Scarring. The periwound skin appearance did not exhibit: Callus, Crepitus, Excoriation, Induration, Rash, Dry/Scaly, Maceration, Atrophie Blanche, Cyanosis, Ecchymosis, Hemosiderin Staining, Mottled, Pallor, Rubor, Erythema. Periwound temperature was noted as No Abnormality. Wound #11 status is Open. Original cause of wound was Bite. The date acquired was: 06/12/2023. The wound has been in treatment 1 weeks. The wound is located on the Left,Proximal Lower Leg. The wound measures 1.5cm length x 1cm width x 0.1cm depth; 1.178cm^2 area and 0.118cm^3 volume. There is Fat Layer (Subcutaneous Tissue) exposed. There is no tunneling or undermining noted. There is a medium amount of serosanguineous drainage noted. The wound margin is distinct with the outline attached to the wound base. There is medium (34-66%) red granulation within the wound bed. There is a medium (34-66%) amount of necrotic tissue within the wound bed including Adherent Slough. The periwound skin appearance exhibited: Scarring, Hemosiderin Staining. The periwound skin appearance did not exhibit: Callus, Crepitus, Excoriation, Induration, Rash, Dry/Scaly, Maceration, Atrophie Blanche, Cyanosis, Ecchymosis, Mottled, Pallor, Rubor, Erythema. Periwound temperature was noted as No Abnormality. Wound #12 status is Open.  Original cause of wound was Blister. The date acquired was: 06/12/2023. The wound has been in treatment 1 weeks. The wound is located on the [facility] Lower Leg. The wound measures 2.2cm length x 2cm width x 0.1cm depth; 3.456cm^2 area and 0.346cm^3 volume. There is no tunneling or undermining noted. There is a medium amount of serosanguineous drainage noted. The wound margin is distinct with the outline attached to the [name], [name] ([id number]) [id number] Page 6 of 8 wound base. There is medium (34-66%) pink, pale granulation within the wound bed. There is a medium (34-66%) amount of necrotic tissue within the wound bed including Adherent Slough. The periwound skin appearance exhibited: Scarring, Hemosiderin Staining. The periwound skin appearance did not exhibit: Callus, Crepitus, Excoriation, Induration, Rash, Dry/Scaly, Maceration, Atrophie Blanche, Cyanosis, Ecchymosis, Mottled, Pallor, Rubor, Erythema. Periwound temperature was noted as No Abnormality. Assessment Active Problems ICD-10 Non-pressure chronic ulcer of other part of right lower leg with fat layer exposed Non-pressure chronic ulcer of other part of left lower leg with fat layer exposed Lymphedema, not elsewhere classified Type 2 diabetes mellitus with other skin ulcer Chronic combined systolic (congestive) and diastolic (congestive)  heart failure Patient's wounds have shown improvement in size and appearance since last clinic visit. I recommended continuing with Hydrofera Blue to the wound beds and to the left leg instead of antibiotic ointment switching to Santyl to help with further debridement. Continue Kerlix/Coban to the left lower extremity. Follow- up in 1 week. Plan Follow-up Appointments: Return Appointment in 1 week. - Dr. Mikey Bussing Tuesday 130pm 07/16/2023 Room 8 Return Appointment in 2 weeks. - Dr .Mikey Bussing Tuesday Room 8 Nurse Visit: - 1130 Friday morning room7  07/05/2023 Anesthetic: (In clinic) Topical Lidocaine 4% applied to wound bed Bathing/ Shower/ Hygiene: May shower with protection but do not get wound dressing(s) wet. Protect dressing(s) with water repellant cover (for example, large plastic bag) or a cast cover and may then take shower. Edema Control - Lymphedema / SCD / Other: Elevate legs to the level of the heart or above for 30 minutes daily and/or when sitting for 3-4 times a day throughout the day. Avoid standing for long periods of time. Additional Orders / Instructions: Follow Nutritious Diet - increase protein Juven Shake 1-2 times daily. The following medication(s) was prescribed: lidocaine topical 5 % ointment ointment topical once daily was prescribed at facility WOUND #10: - Upper Leg Wound Laterality: Right, Medial Cleanser: Soap and Water 1 x Per Day/30 Days Discharge Instructions: May shower and wash wound with dial antibacterial soap and water prior to dressing change. Cleanser: Byram Ancillary Kit - 15 Day Supply (Generic) 1 x Per Day/30 Days Discharge Instructions: Use supplies as instructed; Kit contains: (15) Saline Bullets; (15) 3x3 Gauze; 15 pr Gloves Peri-Wound Care: Skin Prep (Generic) 1 x Per Day/30 Days Discharge Instructions: Use skin prep as directed Prim Dressing: Hydrofera Blue Ready Transfer Foam, 2.5x2.5 (in/in) (Generic) 1 x Per Day/30 Days ary Discharge Instructions: Apply directly to wound bed as directed Secondary Dressing: Zetuvit Plus Silicone Border Dressing 4x4 (in/in) (Generic) 1 x Per Day/30 Days Discharge Instructions: Apply silicone border over primary dressing as directed. WOUND #11: - Lower Leg Wound Laterality: Left, Proximal Cleanser: Soap and Water 2 x Per Week/30 Days Discharge Instructions: May shower and wash wound with dial antibacterial soap and water prior to dressing change. Cleanser: Vashe 5.8 (oz) 2 x Per Week/30 Days Discharge Instructions: Cleanse the wound with Vashe prior to  applying a clean dressing using gauze sponges, not tissue or cotton balls. Peri-Wound Care: Sween Lotion (Moisturizing lotion) 2 x Per Week/30 Days Discharge Instructions: Apply moisturizing lotion as directed Prim Dressing: Hydrofera Blue Ready Transfer Foam, 2.5x2.5 (in/in) 2 x Per Week/30 Days ary Discharge Instructions: Apply directly to wound bed as directed Prim Dressing: Santyl Ointment 2 x Per Week/30 Days ary Discharge Instructions: Apply nickel thick amount to wound bed as instructed Secondary Dressing: ABD Pad, 8x10 2 x Per Week/30 Days Discharge Instructions: Apply over primary dressing as directed. Secondary Dressing: Woven Gauze Sponge, Non-Sterile 4x4 in 2 x Per Week/30 Days Discharge Instructions: Apply over primary dressing as directed. Secondary Dressing: Zetuvit Plus Silicone Border Dressing 4x4 (in/in) 2 x Per Week/30 Days Discharge Instructions: Apply silicone border over primary dressing as directed. Com pression Wrap: Kerlix Roll 4.5x3.1 (in/yd) 2 x Per Week/30 Days Discharge Instructions: Apply Kerlix and Coban compression as directed. *****Roland Rack BOOT first layer apply to upper portion of lower leg. Com pression Wrap: Coban Self-Adherent Wrap 4x5 (in/yd) 2 x Per Week/30 Days Discharge Instructions: Apply over Kerlix as directed. WOUND #12: - Lower Leg Wound Laterality: Left, Anterior, Distal Cleanser: Soap and Water 2 x  Per Week/30 Days Discharge Instructions: May shower and wash wound with dial antibacterial soap and water prior to dressing change. Cleanser: Vashe 5.8 (oz) 2 x Per Week/30 Days Discharge Instructions: Cleanse the wound with Vashe prior to applying a clean dressing using gauze sponges, not tissue or cotton balls. Peri-Wound Care: Sween Lotion (Moisturizing lotion) 2 x Per Week/30 Days Discharge Instructions: Apply moisturizing lotion as directed Prim Dressing: Hydrofera Blue Ready Transfer Foam, 2.5x2.5 (in/in) 2 x Per Week/30 Days ary Discharge  Instructions: Apply directly to wound bed as directed Prim Dressing: Santyl Ointment 2 x Per Week/30 Days [name], [name] ([id number]) [id number] Page 7 of 8 Discharge Instructions: Apply nickel thick amount to wound bed as instructed Secondary Dressing: ABD Pad, 8x10 2 x Per Week/30 Days Discharge Instructions: Apply over primary dressing as directed. Secondary Dressing: Woven Gauze Sponge, Non-Sterile 4x4 in 2 x Per Week/30 Days Discharge Instructions: Apply over primary dressing as directed. Secondary Dressing: Zetuvit Plus Silicone Border Dressing 4x4 (in/in) 2 x Per Week/30 Days Discharge Instructions: Apply silicone border over primary dressing as directed. Compression Wrap: Kerlix Roll 4.5x3.1 (in/yd) 2 x Per Week/30 Days Discharge Instructions: Apply Kerlix and Coban compression as directed. *****Roland Rack BOOT first layer apply to upper portion of lower leg. Compression Wrap: Coban Self-Adherent Wrap 4x5 (in/yd) 2 x Per Week/30 Days Discharge Instructions: Apply over Kerlix as directed. 1. Hydrofera Blue and Santyl under Kerlix/Coban to the left lower extremity 2. Hydrofera Blue to the right lower extremity 3. Follow-up in 1 week Electronic Signature(s) Signed: 07/09/2023 5:13:05 PM By: Geralyn Corwin DO Entered By: Geralyn Corwin on 07/09/2023 15:02:10 -------------------------------------------------------------------------------- HxROS Details Patient Name: Date of Service: [name] 07/09/2023 2:00 PM Medical Record Number: [id number] Patient Account Number: [id number] Date of Birth/Sex: Treating RN: Feb 10, 1931 (89 y.o. F) Primary Care Provider: [name] Other Clinician: Referring Provider: Treating Provider/Extender: Loetta Rough in Treatment: 1 Hematologic/Lymphatic Medical History: Positive for: Anemia Cardiovascular Medical History: Positive for: Congestive Heart Failure; Hypertension;  Peripheral Venous Disease Past Medical History Notes: hyperlipidemia, venous stasis Gastrointestinal Medical History: Past Medical History Notes: GERD Endocrine Medical History: Positive for: Type II Diabetes Musculoskeletal Medical History: Positive for: Osteoarthritis Neurologic Medical History: Positive for: Neuropathy Psychiatric Medical History: Past Medical History Notes: generalized anxiety [name], [name] ([id number]) [id number] Page 8 of 8 Immunizations Pneumococcal Vaccine: Received Pneumococcal Vaccination: Yes Received Pneumococcal Vaccination On or After 60th Birthday: Yes Implantable Devices No devices added Electronic Signature(s) Signed: 07/09/2023 5:13:05 PM By: Geralyn Corwin DO Entered By: Geralyn Corwin on 07/09/2023 14:57:21 -------------------------------------------------------------------------------- SuperBill Details Patient Name: Date of Service: [name] 07/09/2023 Medical Record Number: [id number] Patient Account Number: [id number] Date of Birth/Sex: Treating RN: 12/28/1930 (89 y.o. 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 Facility Procedures : CPT4 Code: [id number] Description: 99213 - WOUND CARE VISIT-LEV 3 EST PT Modifier: Quantity: 1 Physician Procedures : CPT4 Code Description Modifier [id number] 99213 - WC PHYS LEVEL 3 - EST PT ICD-10 Diagnosis Description  L97.812 Non-pressure chronic ulcer of other part of right lower leg  with fat layer exposed L97.822 Non-pressure chronic ulcer of other part of left  lower leg with fat layer exposed I89.0 Lymphedema, not elsewhere classified E11.622 Type 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

## 2023-07-12 ENCOUNTER — Other Ambulatory Visit (HOSPITAL_COMMUNITY): Payer: Self-pay | Admitting: Internal Medicine

## 2023-07-12 DIAGNOSIS — I739 Peripheral vascular disease, unspecified: Secondary | ICD-10-CM

## 2023-07-12 NOTE — Progress Notes (Signed)
[name] ([id number]) [phone].pdf Page 1 of 13 Visit Report for 07/09/2023 Arrival Information Details Patient Name: Date of Service: [name], [name] 07/09/2023 2:00 PM Medical Record Number: [id number] Patient Account Number: [id number] Date of Birth/Sex: Treating RN: 07-14-1931 (89 y.o. [name] Primary Care Yarenis Cerino: [name] Other Clinician: Referring Kathelyn Gombos: Treating Walden Statz/Extender: Loetta Rough in Treatment: 1 Visit Information History Since Last Visit Added or deleted any medications: No Patient Arrived: [name] Any new allergies or adverse reactions: No Arrival Time: 14:04 Had a fall or experienced change in No Accompanied By: friend activities of daily living that may affect Transfer Assistance: None risk of falls: Patient Identification Verified: Yes Signs or symptoms of abuse/neglect since last visito No Secondary Verification Process Completed: Yes Hospitalized since last visit: No Patient Requires Transmission-Based Precautions: No Implantable device outside of the clinic excluding No Patient Has Alerts: No cellular tissue based products placed in the center since last visit: Has Dressing in Place as Prescribed: Yes Has Compression in Place as Prescribed: Yes Pain Present Now: No Electronic Signature(s) Signed: 07/09/2023 5:25:55 PM By: Redmond Pulling RN, BSN Entered By: Redmond Pulling on 07/09/2023 14:06:44 -------------------------------------------------------------------------------- Clinic Level of Care Assessment Details Patient Name: Date of Service: [name] 07/09/2023 2:00 PM Medical Record Number: [id number] Patient Account Number: [id number] Date of Birth/Sex: Treating RN: 29-Apr-1931 (89 y.o. [name] Primary Care Jazon Jipson: [name] Other Clinician: Referring Margarie Mcguirt: Treating Aaliyah Gavel/Extender: Loetta Rough  in Treatment: 1 Clinic Level of Care Assessment Items TOOL 4 Quantity Score X- 1 0 Use when only an EandM is performed on FOLLOW-UP visit ASSESSMENTS - Nursing Assessment / Reassessment X- 1 10 Reassessment of Co-morbidities (includes updates in patient status) X- 1 5 Reassessment of Adherence to Treatment Plan ASSESSMENTS - Wound and Skin A ssessment / Reassessment X - Simple Wound Assessment / Reassessment - one wound 1 5 []  - 0 Complex Wound Assessment / Reassessment - multiple wounds []  - 0 Dermatologic / Skin Assessment (not related to wound area) ASSESSMENTS - Focused Assessment X- 2 5 Circumferential Edema Measurements - multi extremities []  - 0 Nutritional Assessment / Counseling / Intervention [name] ([id number]) [phone].pdf Page 2 of 13 []  - 0 Lower Extremity Assessment (monofilament, tuning fork, pulses) []  - 0 Peripheral Arterial Disease Assessment (using hand held doppler) ASSESSMENTS - Ostomy and/or Continence Assessment and Care []  - 0 Incontinence Assessment and Management []  - 0 Ostomy Care Assessment and Management (repouching, etc.) PROCESS - Coordination of Care X - Simple Patient / Family Education for ongoing care 1 15 []  - 0 Complex (extensive) Patient / Family Education for ongoing care X- 1 10 Staff obtains Chiropractor, Records, T Results / Process Orders est []  - 0 Staff telephones HHA, Nursing Homes / Clarify orders / etc []  - 0 Routine Transfer to another Facility (non-emergent condition) []  - 0 Routine Hospital Admission (non-emergent condition) []  - 0 New Admissions / Manufacturing engineer / Ordering NPWT Apligraf, etc. , []  - 0 Emergency Hospital Admission (emergent condition) []  - 0 Simple Discharge Coordination []  - 0 Complex (extensive) Discharge Coordination PROCESS - Special Needs []  - 0 Pediatric / Minor Patient Management []  - 0 Isolation Patient Management []  - 0 Hearing / Language /  Visual special needs []  - 0 Assessment of Community assistance (transportation, D/C planning, etc.) []  - 0 Additional assistance / Altered mentation []  - 0 Support Surface(s) Assessment (bed, cushion, seat, etc.) INTERVENTIONS -  Wound Cleansing / Measurement []  - 0 Simple Wound Cleansing - one wound X- 3 5 Complex Wound Cleansing - multiple wounds X- 1 5 Wound Imaging (photographs - any number of wounds) []  - 0 Wound Tracing (instead of photographs) []  - 0 Simple Wound Measurement - one wound X- 3 5 Complex Wound Measurement - multiple wounds INTERVENTIONS - Wound Dressings []  - 0 Small Wound Dressing one or multiple wounds X- 1 15 Medium Wound Dressing one or multiple wounds []  - 0 Large Wound Dressing one or multiple wounds X- 1 5 Application of Medications - topical []  - 0 Application of Medications - injection INTERVENTIONS - Miscellaneous []  - 0 External ear exam []  - 0 Specimen Collection (cultures, biopsies, blood, body fluids, etc.) []  - 0 Specimen(s) / Culture(s) sent or taken to Lab for analysis []  - 0 Patient Transfer (multiple staff / Nurse, adult / Similar devices) []  - 0 Simple Staple / Suture removal (25 or less) []  - 0 Complex Staple / Suture removal (26 or more) []  - 0 Hypo / Hyperglycemic Management (close monitor of Blood Glucose) [name], [name] ([id number]) [id number] Page 3 of 13 []  - 0 Ankle / Brachial Index (ABI) - do not check if billed separately X- 1 5 Vital Signs Has the patient been seen at the hospital within the last three years: Yes Total Score: 115 Level Of Care: New/Established - Level 3 Electronic Signature(s) Signed: 07/09/2023 5:25:55 PM By: Redmond Pulling RN, BSN Entered By: Redmond Pulling on 07/09/2023 16:17:58 -------------------------------------------------------------------------------- Encounter Discharge Information Details Patient Name: Date of Service: [name] 07/09/2023 2:00  PM Medical Record Number: [id number] Patient Account Number: [id number] Date of Birth/Sex: Treating RN: 07-03-1931 (89 y.o. [name] Primary Care Tanisha Lutes: [name] Other Clinician: Referring Jakayden Cancio: Treating Aylissa Heinemann/Extender: Loetta Rough in Treatment: 1 Encounter Discharge Information Items Discharge Condition: Stable Ambulatory Status: Walker Discharge Destination: Home Transportation: Private Auto Accompanied By: friend Schedule Follow-up Appointment: Yes Clinical Summary of Care: Patient Declined Electronic Signature(s) Signed: 07/09/2023 5:25:55 PM By: Redmond Pulling RN, BSN Entered By: Redmond Pulling on 07/09/2023 16:19:20 -------------------------------------------------------------------------------- Lower Extremity Assessment Details Patient Name: Date of Service: [name], [name] 07/09/2023 2:00 PM Medical Record Number: [id number] Patient Account Number: [id number] Date of Birth/Sex: Treating RN: June 17, 1931 (89 y.o. [name] Primary Care Mirenda Baltazar: [name] Other Clinician: Referring Aadi Bordner: Treating Ilyaas Musto/Extender: [name]: 1 Edema Assessment Assessed: [Left: No] [Right: No] Edema: [Left: Ye] [Right: s] Calf Left: Right: Point of Measurement: 32 cm From Medial Instep 42 cm 45.6 cm Ankle Left: Right: Point of Measurement: 11 cm From Medial Instep 22.3 cm 23 cm Vascular Assessment Left: [[id number] Page 4 of 13Right:] Extremity colors, hair growth, and conditions: Extremity Color: [phone].pdf Page 4 of 13Normal] Hair Growth on Extremity: [phone].pdf Page 4 of 13Yes] Temperature of Extremity: [phone].pdf Page 4 of 13Hot] Capillary Refill: [id number].pdf Page 4 of 13< 3 seconds] Dependent Rubor:  [phone] Page 4 of 13No No] Electronic Signature(s) Signed: 07/09/2023 5:25:55 PM By: Redmond Pulling RN, BSN Entered By: Redmond Pulling on 07/09/2023 14:08:24 -------------------------------------------------------------------------------- Multi Wound Chart Details Patient Name: Date of Service: [name] 07/09/2023 2:00 PM Medical Record Number: [id number] Patient Account Number: [id number] Date of Birth/Sex: Treating RN: [birth year] (89 y.o. F) Primary Care Taneasha Fuqua: [name] Other Clinician: Referring Kymir Coles: Treating Selah Zelman/Extender: Loetta Rough in Treatment: 1 Vital Signs Height(in): 62 Pulse(bpm): Weight(lbs): 220 Blood Pressure(mmHg): Body  Mass Index(BMI): 40.2 Temperature(F): Respiratory Rate(breaths/min): 18 [10:Photos:] Right, Medial Upper Leg Left, Proximal Lower Leg Left, Distal, Anterior Lower Leg Wound Location: Blister Bite Blister Wounding Event: Diabetic Wound/Ulcer of the Lower Diabetic Wound/Ulcer of the Lower Diabetic Wound/Ulcer of the Lower Primary Etiology: Extremity Extremity Extremity Lymphedema Lymphedema Lymphedema Secondary Etiology: Anemia, Congestive Heart Failure, Anemia, Congestive Heart Failure, Anemia, Congestive Heart Failure, Comorbid History: Hypertension, Peripheral Venous Hypertension, Peripheral Venous Hypertension, Peripheral Venous Disease, Type II Diabetes, Disease, Type II Diabetes, Disease, Type II Diabetes, Osteoarthritis, Neuropathy Osteoarthritis, Neuropathy Osteoarthritis, Neuropathy 06/12/2023 06/12/2023 06/12/2023 Date Acquired: 1 1 1  Weeks of Treatment: Open Open Open Wound Status: No No No Wound Recurrence: No Yes No Clustered Wound: N/A 2 N/A Clustered Quantity: Yes No No Pending A mputation on Presentation: 0.4x0.4x0.1 1.5x1x0.1 2.2x2x0.1 Measurements L x W x D (cm) 0.126 1.178 3.456 A (cm) : rea 0.013 0.118 0.346 Volume (cm)  : 19.70% 55.90% 8.90% % Reduction in A rea: 18.80% 55.80% 8.70% % Reduction in Volume: Grade 1 Grade 1 Grade 1 Classification: Medium Medium Medium Exudate A mount: Serosanguineous Serosanguineous Serosanguineous Exudate Type: red, brown red, brown red, brown Exudate Color: Distinct, outline attached Distinct, outline attached Distinct, outline attached Wound Margin: Small (1-33%) Medium (34-66%) Medium (34-66%) Granulation A mount: Pink Red Pink, Pale Granulation QualityAINZLEE, [name] ([id number]) [id number] Page 5 of 13 Large (67-100%) Medium (34-66%) Medium (34-66%) Necrotic Amount: Fat Layer (Subcutaneous Tissue): Yes Fat Layer (Subcutaneous Tissue): Yes Fascia: No Exposed Structures: Fascia: No Fascia: No Fat Layer (Subcutaneous Tissue): No Tendon: No Tendon: No Tendon: No Muscle: No Muscle: No Muscle: No Joint: No Joint: No Joint: No Bone: No Bone: No Bone: No Small (1-33%) Small (1-33%) Small (1-33%) Epithelialization: Scarring: Yes Scarring: Yes Scarring: Yes Periwound Skin Texture: Excoriation: No Excoriation: No Excoriation: No Induration: No Induration: No Induration: No Callus: No Callus: No Callus: No Crepitus: No Crepitus: No Crepitus: No Rash: No Rash: No Rash: No Maceration: No Maceration: No Maceration: No Periwound Skin Moisture: Dry/Scaly: No Dry/Scaly: No Dry/Scaly: No Atrophie Blanche: No Hemosiderin Staining: Yes Hemosiderin Staining: Yes Periwound Skin Color: Cyanosis: No Atrophie Blanche: No Atrophie Blanche: No Ecchymosis: No Cyanosis: No Cyanosis: No Erythema: No Ecchymosis: No Ecchymosis: No Hemosiderin Staining: No Erythema: No Erythema: No Mottled: No Mottled: No Mottled: No Pallor: No Pallor: No Pallor: No Rubor: No Rubor: No Rubor: No No Abnormality No Abnormality No Abnormality Temperature: Treatment Notes Electronic Signature(s) Signed: 07/09/2023 5:13:05 PM  By: Geralyn Corwin DO Entered By: Geralyn Corwin on 07/09/2023 14:55:45 -------------------------------------------------------------------------------- Multi-Disciplinary Care Plan Details Patient Name: Date of Service: [name] 07/09/2023 2:00 PM Medical Record Number: [id number] Patient Account Number: [id number] Date of Birth/Sex: Treating RN: 1931-07-18 (89 y.o. [name] Primary Care Khani Paino: [name] Other Clinician: Referring Shabazz Mckey: Treating Toris Laverdiere/Extender: Loetta Rough in Treatment: 1 Active Inactive Nutrition Nursing Diagnoses: Potential for alteratiion in Nutrition/Potential for imbalanced nutrition Goals: Patient/caregiver agrees to and verbalizes understanding of need to obtain nutritional consultation Date Initiated: 07/02/2023 Target Resolution Date: 07/12/2023 Goal Status: Active Patient/caregiver will maintain therapeutic glucose control Date Initiated: 07/02/2023 Target Resolution Date: 07/11/2023 Goal Status: Active Interventions: Assess HgA1c results as ordered upon admission and as needed Provide education on elevated blood sugars and impact on wound healing Provide education on nutrition Treatment Activities: Patient referred to Primary Care Physician for further nutritional evaluation : 07/02/2023 Notes: Pain, Acute or Chronic Nursing DiagnosesAHLANA, [name] ([id number]) [phone].pdf Page 6 of 13 Pain, acute or  chronic: actual or potential Potential alteration in comfort, pain Goals: Patient will verbalize adequate pain control and receive pain control interventions during procedures as needed Date Initiated: 07/02/2023 Target Resolution Date: 07/12/2023 Goal Status: Active Patient/caregiver will verbalize comfort level met Date Initiated: 07/02/2023 Target Resolution Date: 07/11/2023 Goal Status: Active Interventions: Encourage patient to take pain medications as  prescribed Provide education on pain management Reposition patient for comfort Treatment Activities: Administer pain control measures as ordered : 07/02/2023 Notes: Wound/Skin Impairment Nursing Diagnoses: Knowledge deficit related to ulceration/compromised skin integrity Goals: Patient/caregiver will verbalize understanding of skin care regimen Date Initiated: 07/02/2023 Target Resolution Date: 07/12/2023 Goal Status: Active Interventions: Assess patient/caregiver ability to perform ulcer/skin care regimen upon admission and as needed Assess ulceration(s) every visit Provide education on ulcer and skin care Treatment Activities: Skin care regimen initiated : 07/02/2023 Topical wound management initiated : 07/02/2023 Notes: Electronic Signature(s) Signed: 07/09/2023 5:25:55 PM By: Redmond Pulling RN, BSN Entered By: Redmond Pulling on 07/09/2023 14:16:10 -------------------------------------------------------------------------------- Pain Assessment Details Patient Name: Date of Service: [name] 07/09/2023 2:00 PM Medical Record Number: [id number] Patient Account Number: [id number] Date of Birth/Sex: Treating RN: [birth year] (89 y.o. [name] Primary Care Esmee Fallaw: [name] Other Clinician: Referring Tagen Brethauer: Treating Kijana Cromie/Extender: Loetta Rough in Treatment: 1 Active Problems Location of Pain Severity and Description of Pain Patient Has Paino No Site Locations [place], [place] [name] ([id number]) [id number] Page 7 of 13 Pain Management and Medication Current Pain Management: Electronic Signature(s) Signed: 07/09/2023 5:25:55 PM By: Redmond Pulling RN, BSN Entered By: Redmond Pulling on 07/09/2023 14:07:39 -------------------------------------------------------------------------------- Patient/Caregiver Education Details Patient Name: Date of Service: [name] 8/6/2024andnbsp2:00 PM Medical  Record Number: [id number] Patient Account Number: [id number] Date of Birth/Gender: Treating RN: [birth year] (89 y.o. [name] Primary Care Physician: [name] Other Clinician: Referring Physician: Treating Physician/Extender: Loetta Rough in Treatment: 1 Education Assessment Education Provided To: Patient Education Topics Provided Wound/Skin Impairment: Methods: Explain/Verbal Responses: State content correctly Electronic Signature(s) Signed: 07/09/2023 5:25:55 PM By: Redmond Pulling RN, BSN Entered By: Redmond Pulling on 07/09/2023 14:16:46 -------------------------------------------------------------------------------- Wound Assessment Details Patient Name: Date of Service: [name] 07/09/2023 2:00 PM Medical Record Number: [id number] Patient Account Number: [id number] Date of Birth/Sex: Treating RN: Jan 20, 1931 (89 y.o. [name] Primary Care Jaxxen Voong: [name] Other Clinician: Referring Maridee Slape: Treating Cleaven Demario/Extender: [name] [place], [place] ([id number]) [id number] Page 8 of 13 Weeks in Treatment: 1 Wound Status Wound Number: 10 Primary Diabetic Wound/Ulcer of the Lower Extremity Etiology: Wound Location: Right, Medial Upper Leg Secondary Lymphedema Wounding Event: Blister Etiology: Date Acquired: 06/12/2023 Wound Open Weeks Of Treatment: 1 Status: Clustered Wound: No Comorbid Anemia, Congestive Heart Failure, Hypertension, Peripheral Pending Amputation On Presentation History: Venous Disease, Type II Diabetes, Osteoarthritis, Neuropathy Photos Wound Measurements Length: (cm) 0.4 Width: (cm) 0.4 Depth: (cm) 0.1 Area: (cm) 0.126 Volume: (cm) 0.013 % Reduction in Area: 19.7% % Reduction in Volume: 18.8% Epithelialization: Small (1-33%) Tunneling: No Undermining: No Wound Description Classification: Grade 1 Wound Margin: Distinct,  outline attached Exudate Amount: Medium Exudate Type: Serosanguineous Exudate Color: red, brown Foul Odor After Cleansing: No Slough/Fibrino Yes Wound Bed Granulation Amount: Small (1-33%) Exposed Structure Granulation Quality: Pink Fascia Exposed: No Necrotic Amount: Large (67-100%) Fat Layer (Subcutaneous Tissue) Exposed: Yes Necrotic Quality: Adherent Slough Tendon Exposed: No Muscle Exposed: No Joint Exposed: No Bone Exposed: No Periwound Skin Texture Texture Color No Abnormalities Noted: No No Abnormalities Noted:  No Callus: No Atrophie Blanche: No Crepitus: No Cyanosis: No Excoriation: No Ecchymosis: No Induration: No Erythema: No Rash: No Hemosiderin Staining: No Scarring: Yes Mottled: No Pallor: No Moisture Rubor: No No Abnormalities Noted: No Dry / Scaly: No Temperature / Pain Maceration: No Temperature: No Abnormality Treatment Notes Wound #10 (Upper Leg) Wound Laterality: Right, Medial Cleanser Soap and Water Discharge Instruction: May shower and wash wound with dial antibacterial soap and water prior to dressing change. Byram Ancillary Kit - 15 Day Supply Discharge Instruction: Use supplies as instructed; Kit contains: (15) Saline Bullets; (15) 3x3 Gauze; 15 pr Gloves [name], [name] ([id number]) [id number].pdf Page 9 of 13 Peri-Wound Care Skin Prep Discharge Instruction: Use skin prep as directed Topical Primary Dressing Hydrofera Blue Ready Transfer Foam, 2.5x2.5 (in/in) Discharge Instruction: Apply directly to wound bed as directed Secondary Dressing Zetuvit Plus Silicone Border Dressing 4x4 (in/in) Discharge Instruction: Apply silicone border over primary dressing as directed. Secured With Compression Wrap Compression Stockings Facilities manager) Signed: 07/09/2023 5:25:55 PM By: Redmond Pulling RN, BSN Signed: 07/12/2023 12:52:02 PM By: Thayer Dallas Entered By: Thayer Dallas on 07/09/2023  14:13:15 -------------------------------------------------------------------------------- Wound Assessment Details Patient Name: Date of Service: [name], [name] 07/09/2023 2:00 PM Medical Record Number: [id number] Patient Account Number: [id number] Date of Birth/Sex: Treating RN: 1931-04-01 (89 y.o. [name] Primary Care Bedford Winsor: [name] Other Clinician: Referring Arvine Clayburn: Treating Feliciana Narayan/Extender: [name]: 1 Wound Status Wound Number: 11 Primary Diabetic Wound/Ulcer of the Lower Extremity Etiology: Wound Location: Left, Proximal Lower Leg Secondary Lymphedema Wounding Event: Bite Etiology: Date Acquired: 06/12/2023 Wound Open Weeks Of Treatment: 1 Status: Clustered Wound: Yes Comorbid Anemia, Congestive Heart Failure, Hypertension, Peripheral History: Venous Disease, Type II Diabetes, Osteoarthritis, Neuropathy Photos Wound Measurements Length: (cm) 1.5 Width: (cm) 1 Depth: (cm) 0.1 Clustered Quantity: 2 Area: (cm) 1.178 Volume: (cm) 0.118 [name], [name] ([id number]) Wound Description Classification: Grade 1 Wound Margin: Distinct, outline attached Exudate Amount: Medium Exudate Type: Serosanguineous Exudate Color: red, brown Foul Odor After Cleansing: No Slough/Fibrino Yes % Reduction in Area: 55.9% % Reduction in Volume: 55.8% Epithelialization: Small (1-33%) Tunneling: No Undermining: No [id number] Page 10 of 13 Wound Bed Granulation Amount: Medium (34-66%) Exposed Structure Granulation Quality: Red Fascia Exposed: No Necrotic Amount: Medium (34-66%) Fat Layer (Subcutaneous Tissue) Exposed: Yes Necrotic Quality: Adherent Slough Tendon Exposed: No Muscle Exposed: No Joint Exposed: No Bone Exposed: No Periwound Skin Texture Texture Color No Abnormalities Noted: No No Abnormalities Noted: No Callus: No Atrophie Blanche: No Crepitus: No Cyanosis:  No Excoriation: No Ecchymosis: No Induration: No Erythema: No Rash: No Hemosiderin Staining: Yes Scarring: Yes Mottled: No Pallor: No Moisture Rubor: No No Abnormalities Noted: No Dry / Scaly: No Temperature / Pain Maceration: No Temperature: No Abnormality Treatment Notes Wound #11 (Lower Leg) Wound Laterality: Left, Proximal Cleanser Soap and Water Discharge Instruction: May shower and wash wound with dial antibacterial soap and water prior to dressing change. Vashe 5.8 (oz) Discharge Instruction: Cleanse the wound with Vashe prior to applying a clean dressing using gauze sponges, not tissue or cotton balls. Peri-Wound Care Sween Lotion (Moisturizing lotion) Discharge Instruction: Apply moisturizing lotion as directed Topical Primary Dressing Hydrofera Blue Ready Transfer Foam, 2.5x2.5 (in/in) Discharge Instruction: Apply directly to wound bed as directed Santyl Ointment Discharge Instruction: Apply nickel thick amount to wound bed as instructed Secondary Dressing ABD Pad, 8x10 Discharge Instruction: Apply over primary dressing as directed. Woven Gauze Sponge, Non-Sterile 4x4 in Discharge Instruction: Apply over  primary dressing as directed. Zetuvit Plus Silicone Border Dressing 4x4 (in/in) Discharge Instruction: Apply silicone border over primary dressing as directed. Secured With Compression Wrap Kerlix Roll 4.5x3.1 (in/yd) Discharge Instruction: Apply Kerlix and Coban compression as directed. *****Roland Rack BOOT first layer apply to upper portion of lower leg. Coban Self-Adherent Wrap 4x5 (in/yd) Discharge Instruction: Apply over Kerlix as directed. Compression Stockings Add-Ons [name], [name] ([id number]) [phone].pdf Page 11 of 13 Electronic Signature(s) Signed: 07/09/2023 5:25:55 PM By: Redmond Pulling RN, BSN Signed: 07/12/2023 12:52:02 PM By: Thayer Dallas Entered By: Thayer Dallas on 07/09/2023  14:14:19 -------------------------------------------------------------------------------- Wound Assessment Details Patient Name: Date of Service: [name], [name] 07/09/2023 2:00 PM Medical Record Number: [id number] Patient Account Number: [id number] Date of Birth/Sex: Treating RN: September 16, 1931 (89 y.o. [name] Primary Care Aaro Meyers: [name] Other Clinician: Referring Antwyne Pingree: Treating Jaleah Lefevre/Extender: [name]: 1 Wound Status Wound Number: 12 Primary Diabetic Wound/Ulcer of the Lower Extremity Etiology: Wound Location: Left, Distal, Anterior Lower Leg Secondary Lymphedema Wounding Event: Blister Etiology: Date Acquired: 06/12/2023 Wound Open Weeks Of Treatment: 1 Status: Clustered Wound: No Comorbid Anemia, Congestive Heart Failure, Hypertension, Peripheral History: Venous Disease, Type II Diabetes, Osteoarthritis, Neuropathy Photos Wound Measurements Length: (cm) 2.2 Width: (cm) 2 Depth: (cm) 0.1 Area: (cm) 3.456 Volume: (cm) 0.346 % Reduction in Area: 8.9% % Reduction in Volume: 8.7% Epithelialization: Small (1-33%) Tunneling: No Undermining: No Wound Description Classification: Grade 1 Wound Margin: Distinct, outline attached Exudate Amount: Medium Exudate Type: Serosanguineous Exudate Color: red, brown Foul Odor After Cleansing: No Slough/Fibrino Yes Wound Bed Granulation Amount: Medium (34-66%) Exposed Structure Granulation Quality: Pink, Pale Fascia Exposed: No Necrotic Amount: Medium (34-66%) Fat Layer (Subcutaneous Tissue) Exposed: No Necrotic Quality: Adherent Slough Tendon Exposed: No Muscle Exposed: No Joint Exposed: No Bone Exposed: No Periwound Skin Texture Texture Color No Abnormalities Noted: No No Abnormalities Noted: No Callus: No Atrophie Blanche: No Crepitus: No Cyanosis: No [name], [name] ([id number]) [id number] Page 12 of  13 Excoriation: No Ecchymosis: No Induration: No Erythema: No Rash: No Hemosiderin Staining: Yes Scarring: Yes Mottled: No Pallor: No Moisture Rubor: No No Abnormalities Noted: No Dry / Scaly: No Temperature / Pain Maceration: No Temperature: No Abnormality Treatment Notes Wound #12 (Lower Leg) Wound Laterality: Left, Anterior, Distal Cleanser Soap and Water Discharge Instruction: May shower and wash wound with dial antibacterial soap and water prior to dressing change. Vashe 5.8 (oz) Discharge Instruction: Cleanse the wound with Vashe prior to applying a clean dressing using gauze sponges, not tissue or cotton balls. Peri-Wound Care Sween Lotion (Moisturizing lotion) Discharge Instruction: Apply moisturizing lotion as directed Topical Primary Dressing Hydrofera Blue Ready Transfer Foam, 2.5x2.5 (in/in) Discharge Instruction: Apply directly to wound bed as directed Santyl Ointment Discharge Instruction: Apply nickel thick amount to wound bed as instructed Secondary Dressing ABD Pad, 8x10 Discharge Instruction: Apply over primary dressing as directed. Woven Gauze Sponge, Non-Sterile 4x4 in Discharge Instruction: Apply over primary dressing as directed. Zetuvit Plus Silicone Border Dressing 4x4 (in/in) Discharge Instruction: Apply silicone border over primary dressing as directed. Secured With Compression Wrap Kerlix Roll 4.5x3.1 (in/yd) Discharge Instruction: Apply Kerlix and Coban compression as directed. *****Roland Rack BOOT first layer apply to upper portion of lower leg. Coban Self-Adherent Wrap 4x5 (in/yd) Discharge Instruction: Apply over Kerlix as directed. Compression Stockings Add-Ons Electronic Signature(s) Signed: 07/09/2023 5:25:55 PM By: Redmond Pulling RN, BSN Signed: 07/12/2023 12:52:02 PM By: Thayer Dallas Entered By: Thayer Dallas on 07/09/2023 14:14:49 -------------------------------------------------------------------------------- Vitals Details Patient  Name:  Date of Service: [name], [name] 07/09/2023 2:00 PM Medical Record Number: [id number] Patient Account Number: [id number] Date of Birth/Sex: Treating RN: [birth year] (89 y.o. [name] Primary Care Eilah Common: [name] Other Clinician: Referring Zoha Spranger: Treating Adriano Bischof/Extender: [name]: 1 Vital Signs MADISSON, WALKENHORST ([id number]) [id number] Page 13 of 13 Time Taken: 14:07 Respiratory Rate (breaths/min): 18 Height (in): 62 Reference Range: 80 - 120 mg / dl Weight (lbs): 387 Body Mass Index (BMI): 40.2 Electronic Signature(s) Signed: 07/09/2023 5:25:55 PM By: Redmond Pulling RN, BSN Entered By: Redmond Pulling on 07/09/2023 14:07:30

## 2023-07-15 ENCOUNTER — Ambulatory Visit (HOSPITAL_COMMUNITY)
Admission: RE | Admit: 2023-07-15 | Discharge: 2023-07-15 | Disposition: A | Discharge status: Home / Self Care | Payer: Medicare PPO | Source: Ambulatory Visit | Attending: Internal Medicine | Admitting: Internal Medicine

## 2023-07-15 ENCOUNTER — Encounter (HOSPITAL_BASED_OUTPATIENT_CLINIC_OR_DEPARTMENT_OTHER): Payer: Medicare PPO | Admitting: Internal Medicine

## 2023-07-15 DIAGNOSIS — I739 Peripheral vascular disease, unspecified: Secondary | ICD-10-CM | POA: Diagnosis present

## 2023-07-15 LAB — VAS US ABI WITH/WO TBI
Left ABI: 0.91
Right ABI: 0.92

## 2023-07-16 ENCOUNTER — Encounter (HOSPITAL_BASED_OUTPATIENT_CLINIC_OR_DEPARTMENT_OTHER): Payer: Medicare PPO | Admitting: Internal Medicine

## 2023-07-16 DIAGNOSIS — L97812 Non-pressure chronic ulcer of other part of right lower leg with fat layer exposed: Secondary | ICD-10-CM

## 2023-07-16 DIAGNOSIS — E11622 Type 2 diabetes mellitus with other skin ulcer: Secondary | ICD-10-CM | POA: Diagnosis not present

## 2023-07-16 DIAGNOSIS — I5042 Chronic combined systolic (congestive) and diastolic (congestive) heart failure: Secondary | ICD-10-CM | POA: Diagnosis not present

## 2023-07-16 DIAGNOSIS — I890 Lymphedema, not elsewhere classified: Secondary | ICD-10-CM | POA: Diagnosis not present

## 2023-07-16 NOTE — Progress Notes (Signed)
[name], [name] ([id number]) [id number] Page 1 of 13 Visit Report for 07/16/2023 Arrival Information Details Patient Name: Date of Service: [name], [name] 07/16/2023 1:30 PM Medical Record Number: [id number] Patient Account Number: [id number] Date of Birth/Sex: Treating RN: May 24, 1931 (89 y.o. [name] Primary Care [name]: [name] Other Clinician: Referring Keliyah Spillman: Treating Saja Bartolini/Extender: Loetta Rough in Treatment: 2 Visit Information History Since Last Visit Added or deleted any medications: No Patient Arrived: [name] Any new allergies or adverse reactions: No Arrival Time: 13:35 Had a fall or experienced change in No Accompanied By: friend activities of daily living that may affect Transfer Assistance: None risk of falls: Patient Identification Verified: Yes Signs or symptoms of abuse/neglect since last visito No Secondary Verification Process Completed: Yes Hospitalized since last visit: No Patient Requires Transmission-Based Precautions: No Implantable device outside of the clinic excluding No Patient Has Alerts: Yes cellular tissue based products placed in the center Patient Alerts: ABIs: R:0.92 L:0.91 8/24 since last visit: Has Dressing in Place as Prescribed: Yes Has Compression in Place as Prescribed: Yes Pain Present Now: No Electronic Signature(s) Signed: 07/16/2023 4:28:55 PM By: [name] Entered By: [name] on 07/16/2023 13:52:20 -------------------------------------------------------------------------------- Compression Therapy Details Patient Name: Date of Service: [name], [name] 07/16/2023 1:30 PM Medical Record Number: [id number] Patient Account Number: [id number] Date of Birth/Sex: Treating RN: May 08, 1931 (89 y.o. [name] Primary Care Rylei Masella: [name] Other Clinician: Referring Carianne Taira: Treating Charvi Gammage/Extender:  Loetta Rough in Treatment: 2 Compression Therapy Performed for Wound Assessment: Wound #11 Left,Proximal Lower Leg Performed By: Clinician Katherine Bruin, RN Compression Type: Double Layer Post Procedure Diagnosis Same as Pre-procedure Electronic Signature(s) Signed: 07/16/2023 4:28:55 PM By: [name] Entered By: [name] on 07/16/2023 13:51:49 [name] ([id number]) [id number] Page 2 of 13 -------------------------------------------------------------------------------- Encounter Discharge Information Details Patient Name: Date of Service: [name], [name] 07/16/2023 1:30 PM Medical Record Number: [id number] Patient Account Number: [id number] Date of Birth/Sex: Treating RN: Sep 30, 1931 (89 y.o. [name] Primary Care Earl Losee: [name] Other Clinician: Referring Witten Certain: Treating Aylyn Wenzler/Extender: Loetta Rough in Treatment: 2 Encounter Discharge Information Items Post Procedure Vitals Discharge Condition: Stable Temperature (F): 98.4 Ambulatory Status: Walker Pulse (bpm): 63 Discharge Destination: Home Respiratory Rate (breaths/min): 20 Transportation: Private Auto Blood Pressure (mmHg): 109/70 Accompanied By: friend Schedule Follow-up Appointment: Yes Clinical Summary of Care: Patient Declined Electronic Signature(s) Signed: 07/16/2023 4:28:55 PM By: [name] Entered By: [name] on 07/16/2023 14:08:26 -------------------------------------------------------------------------------- Lower Extremity Assessment Details Patient Name: Date of Service: [name], [name] 07/16/2023 1:30 PM Medical Record Number: [id number] Patient Account Number: [id number] Date of Birth/Sex: Treating RN: [birth year] (89 y.o. [name] Primary Care Aleeha Boline: [name] Other Clinician: Referring [name]: Treating  [name]/Extender: [name]: 2 Edema Assessment Assessed: [Left: No] [Right: No] Edema: [Left: Ye] [Right: s] Calf Left: Right: Point of Measurement: 32 cm From Medial Instep 42.5 cm 45.6 cm Ankle Left: Right: Point of Measurement: 11 cm From Medial Instep 23.4 cm 23 cm Vascular Assessment Extremity colors, hair growth, and conditions: Extremity Color: [Left:Normal] Hair Growth on Extremity: [Left:Yes] Temperature of Extremity: [Left:Hot] Capillary Refill: [Left:< 3 seconds] Dependent Rubor: [Left:No No] Electronic Signature(s) Signed: 07/16/2023 4:28:55 PM By: [name] Entered By: [name] on 07/16/2023 13:37:37 [name] ([id number]) [id number] Page 3 of 13 -------------------------------------------------------------------------------- Multi Wound Chart Details Patient Name: Date of Service: [name], [name] 07/16/2023  1:30 PM Medical Record Number: [id number] Patient Account Number: [id number] Date of Birth/Sex: Treating RN: Oct 01, 1931 (89 y.o. F) Primary Care Amariah Kierstead: [name] Other Clinician: Referring Soniyah Mcglory: Treating Jerrold Haskell/Extender: [name]: 2 Vital Signs Height(in): 62 Pulse(bpm): 63 Weight(lbs): 220 Blood Pressure(mmHg): 109/70 Body Mass Index(BMI): 40.2 Temperature(F): 98.4 Respiratory Rate(breaths/min): 20 [10:Photos:] Right, Medial Upper Leg Left, Proximal Lower Leg Left, Distal, Anterior Lower Leg Wound Location: Blister Bite Blister Wounding Event: Diabetic Wound/Ulcer of the Lower Diabetic Wound/Ulcer of the Lower Diabetic Wound/Ulcer of the Lower Primary Etiology: Extremity Extremity Extremity Lymphedema Lymphedema Lymphedema Secondary Etiology: Anemia, Congestive Heart Failure, Anemia, Congestive Heart Failure, Anemia, Congestive Heart Failure, Comorbid History: Hypertension,  Peripheral Venous Hypertension, Peripheral Venous Hypertension, Peripheral Venous Disease, Type II Diabetes, Disease, Type II Diabetes, Disease, Type II Diabetes, Osteoarthritis, Neuropathy Osteoarthritis, Neuropathy Osteoarthritis, Neuropathy 06/12/2023 06/12/2023 06/12/2023 Date Acquired: 2 2 2  Weeks of Treatment: Open Open Open Wound Status: No No No Wound Recurrence: No Yes No Clustered Wound: N/A 2 N/A Clustered Quantity: Yes No No Pending A mputation on Presentation: 0.5x0.3x0.1 0.7x0.4x0.1 1.8x2x0.1 Measurements L x W x D (cm) 0.118 0.22 2.827 A (cm) : rea 0.012 0.022 0.283 Volume (cm) : 24.80% 91.80% 25.50% % Reduction in A rea: 25.00% 91.80% 25.30% % Reduction in Volume: Grade 1 Grade 1 Grade 1 Classification: Medium Medium Medium Exudate A mount: Serosanguineous Serosanguineous Serosanguineous Exudate Type: red, brown red, brown red, brown Exudate Color: Distinct, outline attached Distinct, outline attached Distinct, outline attached Wound Margin: Small (1-33%) Medium (34-66%) Medium (34-66%) Granulation A mount: Pink Red Pink, Pale Granulation Quality: Large (67-100%) Medium (34-66%) Medium (34-66%) Necrotic A mount: Fat Layer (Subcutaneous Tissue): Yes Fat Layer (Subcutaneous Tissue): Yes Fascia: No Exposed Structures: Fascia: No Fascia: No Fat Layer (Subcutaneous Tissue): No Tendon: No Tendon: No Tendon: No Muscle: No Muscle: No Muscle: No Joint: No Joint: No Joint: No Bone: No Bone: No Bone: No Small (1-33%) Small (1-33%) Small (1-33%) Epithelialization: N/A Debridement - Selective/Open Wound Debridement - Selective/Open Wound Debridement: Pre-procedure Verification/Time Out N/A 13:48 13:48 Taken: N/A Lidocaine 4% Topical Solution Lidocaine 4% Topical Solution Pain Control: N/A [facility] Tissue Debrided: N/A Non-Viable Tissue Non-Viable Tissue Level: N/A 0.22 2.83 Debridement A (sq cm): rea N/A Curette  Curette Instrument: N/A Minimum Minimum Bleeding: N/A Pressure Pressure Hemostasis A chievedTEAGHEN, HEDSTROM ([id number]) [id number] Page 4 of 13 N/A Procedure was tolerated well Procedure was tolerated well Debridement Treatment Response: N/A 0.7x0.4x0.1 1.8x2x0.1 Post Debridement Measurements L x W x D (cm) N/A 0.022 0.283 Post Debridement Volume: (cm) Scarring: Yes Scarring: Yes Scarring: Yes Periwound Skin Texture: Excoriation: No Excoriation: No Excoriation: No Induration: No Induration: No Induration: No Callus: No Callus: No Callus: No Crepitus: No Crepitus: No Crepitus: No Rash: No Rash: No Rash: No Maceration: No Maceration: No Maceration: No Periwound Skin Moisture: Dry/Scaly: No Dry/Scaly: No Dry/Scaly: No Atrophie Blanche: No Hemosiderin Staining: Yes Hemosiderin Staining: Yes Periwound Skin Color: Cyanosis: No Atrophie Blanche: No Atrophie Blanche: No Ecchymosis: No Cyanosis: No Cyanosis: No Erythema: No Ecchymosis: No Ecchymosis: No Hemosiderin Staining: No Erythema: No Erythema: No Mottled: No Mottled: No Mottled: No Pallor: No Pallor: No Pallor: No Rubor: No Rubor: No Rubor: No No Abnormality No Abnormality No Abnormality Temperature: N/A Compression Therapy Debridement Procedures Performed: Debridement Treatment Notes Wound #10 (Upper Leg) Wound Laterality: Right, Medial Cleanser Soap and Water Discharge Instruction: May shower and wash wound with dial antibacterial soap and water prior to dressing change. Byram Ancillary  Kit - 15 Day Supply Discharge Instruction: Use supplies as instructed; Kit contains: (15) Saline Bullets; (15) 3x3 Gauze; 15 pr Gloves Peri-Wound Care Skin Prep Discharge Instruction: Use skin prep as directed Topical Primary Dressing Hydrofera Blue Ready Transfer Foam, 2.5x2.5 (in/in) Discharge Instruction: Apply directly to wound bed as directed Secondary  Dressing Zetuvit Plus Silicone Border Dressing 4x4 (in/in) Discharge Instruction: Apply silicone border over primary dressing as directed. Secured With Compression Wrap Compression Stockings Add-Ons Wound #11 (Lower Leg) Wound Laterality: Left, Proximal Cleanser Soap and Water Discharge Instruction: May shower and wash wound with dial antibacterial soap and water prior to dressing change. Vashe 5.8 (oz) Discharge Instruction: Cleanse the wound with Vashe prior to applying a clean dressing using gauze sponges, not tissue or cotton balls. Peri-Wound Care Sween Lotion (Moisturizing lotion) Discharge Instruction: Apply moisturizing lotion as directed Topical Primary Dressing Hydrofera Blue Ready Transfer Foam, 2.5x2.5 (in/in) Discharge Instruction: Apply directly to wound bed as directed Santyl Ointment Discharge Instruction: Apply nickel thick amount to wound bed as instructed Secondary Dressing ABD Pad, 8x10 AZMARIAH, SUPINGER ([id number]) [id number] Page 5 of 13 Discharge Instruction: Apply over primary dressing as directed. Woven Gauze Sponge, Non-Sterile 4x4 in Discharge Instruction: Apply over primary dressing as directed. Secured With Compression Wrap Urgo K2 Lite, (equivalent to a 3 layer) two layer compression system, regular Discharge Instruction: Apply Urgo K2 Lite as directed (alternative to 3 layer compression). Compression Stockings Add-Ons Wound #12 (Lower Leg) Wound Laterality: Left, Anterior, Distal Cleanser Soap and Water Discharge Instruction: May shower and wash wound with dial antibacterial soap and water prior to dressing change. Vashe 5.8 (oz) Discharge Instruction: Cleanse the wound with Vashe prior to applying a clean dressing using gauze sponges, not tissue or cotton balls. Peri-Wound Care Sween Lotion (Moisturizing lotion) Discharge Instruction: Apply moisturizing lotion as directed Topical Primary Dressing Hydrofera Blue  Ready Transfer Foam, 2.5x2.5 (in/in) Discharge Instruction: Apply directly to wound bed as directed Santyl Ointment Discharge Instruction: Apply nickel thick amount to wound bed as instructed Secondary Dressing ABD Pad, 8x10 Discharge Instruction: Apply over primary dressing as directed. Woven Gauze Sponge, Non-Sterile 4x4 in Discharge Instruction: Apply over primary dressing as directed. Secured With Compression Wrap Urgo K2 Lite, (equivalent to a 3 layer) two layer compression system, regular Discharge Instruction: Apply Urgo K2 Lite as directed (alternative to 3 layer compression). Compression Stockings Add-Ons Electronic Signature(s) Signed: 07/16/2023 4:37:08 PM By: Geralyn Corwin DO Entered By: Geralyn Corwin on 07/16/2023 14:34:32 -------------------------------------------------------------------------------- Multi-Disciplinary Care Plan Details Patient Name: Date of Service: [name], [name] 07/16/2023 1:30 PM Medical Record Number: [id number] Patient Account Number: [id number] Date of Birth/Sex: Treating RN: 01-Jul-1931 (89 y.o. [name] Primary Care Haskell Rihn: [name] Other Clinician: Referring Norton Bivins: Treating Maikayla Beggs/Extender: Loetta Rough in Treatment: 2 MADALEN, OZAN ([id number]) [id number] Page 6 of 13 Active Inactive Nutrition Nursing Diagnoses: Potential for alteratiion in Nutrition/Potential for imbalanced nutrition Goals: Patient/caregiver agrees to and verbalizes understanding of need to obtain nutritional consultation Date Initiated: 07/02/2023 Target Resolution Date: 08/02/2023 Goal Status: Active Patient/caregiver will maintain therapeutic glucose control Date Initiated: 07/02/2023 Target Resolution Date: 08/02/2023 Goal Status: Active Interventions: Assess HgA1c results as ordered upon admission and as needed Provide education on elevated blood sugars and impact  on wound healing Provide education on nutrition Treatment Activities: Patient referred to Primary Care Physician for further nutritional evaluation : 07/02/2023 Notes: Pain, Acute or Chronic Nursing Diagnoses: Pain, acute or chronic: actual or potential Potential alteration in comfort, pain  Goals: Patient will verbalize adequate pain control and receive pain control interventions during procedures as needed Date Initiated: 07/02/2023 Target Resolution Date: 08/02/2023 Goal Status: Active Patient/caregiver will verbalize comfort level met Date Initiated: 07/02/2023 Target Resolution Date: 08/02/2023 Goal Status: Active Interventions: Encourage patient to take pain medications as prescribed Provide education on pain management Reposition patient for comfort Treatment Activities: Administer pain control measures as ordered : 07/02/2023 Notes: Wound/Skin Impairment Nursing Diagnoses: Knowledge deficit related to ulceration/compromised skin integrity Goals: Patient/caregiver will verbalize understanding of skin care regimen Date Initiated: 07/02/2023 Target Resolution Date: 08/02/2023 Goal Status: Active Interventions: Assess patient/caregiver ability to perform ulcer/skin care regimen upon admission and as needed Assess ulceration(s) every visit Provide education on ulcer and skin care Treatment Activities: Skin care regimen initiated : 07/02/2023 Topical wound management initiated : 07/02/2023 Notes: Electronic Signature(s) Signed: 07/16/2023 4:28:55 PM By: [name] Entered By: [name] on 07/16/2023 14:07:04 [name] ([id number]) [id number] Page 7 of 13 -------------------------------------------------------------------------------- Pain Assessment Details Patient Name: Date of Service: [name], [name] 07/16/2023 1:30 PM Medical Record Number: [id number] Patient Account Number: [id number] Date of Birth/Sex: Treating  RN: 10-28-1931 (89 y.o. [name] Primary Care Zandria Woldt: [name] Other Clinician: Referring Layana Konkel: Treating Israa Caban/Extender: Loetta Rough in Treatment: 2 Active Problems Location of Pain Severity and Description of Pain Patient Has Paino No Site Locations Rate the pain. Current Pain Level: 0 Pain Management and Medication Current Pain Management: Electronic Signature(s) Signed: 07/16/2023 4:28:55 PM By: [name] Entered By: [name] on 07/16/2023 13:36:29 -------------------------------------------------------------------------------- Patient/Caregiver Education Details Patient Name: Date of Service: [name] 8/13/2024andnbsp1:30 PM Medical Record Number: [id number] Patient Account Number: [id number] Date of Birth/Gender: Treating RN: 1931/11/27 (89 y.o. [name] Primary Care Physician: [name] Other Clinician: Referring Physician: Treating Physician/Extender: Loetta Rough in Treatment: 2 Education Assessment Education Provided To: Patient Education Topics Provided Wound/Skin Impairment: Methods: Explain/Verbal Responses: Reinforcements needed, State content correctly [name], [name] ([id number]) [id number] Page 8 of 13 Electronic Signature(s) Signed: 07/16/2023 4:28:55 PM By: [name] Entered By: [name] on 07/16/2023 14:07:41 -------------------------------------------------------------------------------- Wound Assessment Details Patient Name: Date of Service: [name], [name] 07/16/2023 1:30 PM Medical Record Number: [id number] Patient Account Number: [id number] Date of Birth/Sex: Treating RN: May 12, 1931 (89 y.o. [name] Primary Care Kla Bily: [name] Other Clinician: Referring Charlena Haub: Treating Jeremaine Maraj/Extender: [name]: 2 Wound Status Wound Number: 10 Primary Diabetic Wound/Ulcer of the Lower Extremity Etiology: Wound Location: Right, Medial Upper Leg Secondary Lymphedema Wounding Event: Blister Etiology: Date Acquired: 06/12/2023 Wound Open Weeks Of Treatment: 2 Status: Clustered Wound: No Comorbid Anemia, Congestive Heart Failure, Hypertension, Peripheral Pending Amputation On Presentation History: Venous Disease, Type II Diabetes, Osteoarthritis, Neuropathy Photos Wound Measurements Length: (cm) 0.5 Width: (cm) 0.3 Depth: (cm) 0.1 Area: (cm) 0.118 Volume: (cm) 0.012 % Reduction in Area: 24.8% % Reduction in Volume: 25% Epithelialization: Small (1-33%) Wound Description Classification: Grade 1 Wound Margin: Distinct, outline attached Exudate Amount: Medium Exudate Type: Serosanguineous Exudate Color: red, brown Foul Odor After Cleansing: No Slough/Fibrino Yes Wound Bed Granulation Amount: Small (1-33%) Exposed Structure Granulation Quality: Pink Fascia Exposed: No Necrotic Amount: Large (67-100%) Fat Layer (Subcutaneous Tissue) Exposed: Yes Necrotic Quality: Adherent Slough Tendon Exposed: No Muscle Exposed: No Joint Exposed: No Bone Exposed: No Periwound Skin Texture Texture Color No Abnormalities Noted: No No Abnormalities Noted: No Callus: No Atrophie Blanche: No Crepitus: No Cyanosis: No Klammer,  Emelia [name] ([id number]) [id number] Page 9 of 13 Excoriation: No Ecchymosis: No Induration: No Erythema: No Rash: No Hemosiderin Staining: No Scarring: Yes Mottled: No Pallor: No Moisture Rubor: No No Abnormalities Noted: No Dry / Scaly: No Temperature / Pain Maceration: No Temperature: No Abnormality Treatment Notes Wound #10 (Upper Leg) Wound Laterality: Right, Medial Cleanser Soap and Water Discharge Instruction: May shower and wash wound with dial antibacterial soap and water prior to dressing change. Byram  Ancillary Kit - 15 Day Supply Discharge Instruction: Use supplies as instructed; Kit contains: (15) Saline Bullets; (15) 3x3 Gauze; 15 pr Gloves Peri-Wound Care Skin Prep Discharge Instruction: Use skin prep as directed Topical Primary Dressing Hydrofera Blue Ready Transfer Foam, 2.5x2.5 (in/in) Discharge Instruction: Apply directly to wound bed as directed Secondary Dressing Zetuvit Plus Silicone Border Dressing 4x4 (in/in) Discharge Instruction: Apply silicone border over primary dressing as directed. Secured With Compression Wrap Compression Stockings Facilities manager) Signed: 07/16/2023 4:28:55 PM By: [name] Entered By: [name] on 07/16/2023 13:40:49 -------------------------------------------------------------------------------- Wound Assessment Details Patient Name: Date of Service: [name], [name] 07/16/2023 1:30 PM Medical Record Number: [id number] Patient Account Number: [id number] Date of Birth/Sex: Treating RN: [birth year] (89 y.o. [name] Primary Care Bernadette Gores: [name] Other Clinician: Referring Louise Victory: Treating Asyia Hornung/Extender: [name]: 2 Wound Status Wound Number: 11 Primary Diabetic Wound/Ulcer of the Lower Extremity Etiology: Wound Location: Left, Proximal Lower Leg Secondary Lymphedema Wounding Event: Bite Etiology: Date Acquired: 06/12/2023 Wound Open Weeks Of Treatment: 2 Status: Clustered Wound: Yes Comorbid Anemia, Congestive Heart Failure, Hypertension, Peripheral History: Venous Disease, Type II Diabetes, Osteoarthritis, Neuropathy Photos NOVALEI, PAOLETTI ([id number]) [id number].pdf Page 10 of 13 Wound Measurements Length: (cm) 0.7 Width: (cm) 0.4 Depth: (cm) 0.1 Clustered Quantity: 2 Area: (cm) 0.22 Volume: (cm) 0.022 % Reduction in Area: 91.8% % Reduction in Volume: 91.8% Epithelialization: Small  (1-33%) Wound Description Classification: Grade 1 Wound Margin: Distinct, outline attached Exudate Amount: Medium Exudate Type: Serosanguineous Exudate Color: red, brown Foul Odor After Cleansing: No Slough/Fibrino Yes Wound Bed Granulation Amount: Medium (34-66%) Exposed Structure Granulation Quality: Red Fascia Exposed: No Necrotic Amount: Medium (34-66%) Fat Layer (Subcutaneous Tissue) Exposed: Yes Tendon Exposed: No Muscle Exposed: No Joint Exposed: No Bone Exposed: No Periwound Skin Texture Texture Color No Abnormalities Noted: No No Abnormalities Noted: No Callus: No Atrophie Blanche: No Crepitus: No Cyanosis: No Excoriation: No Ecchymosis: No Induration: No Erythema: No Rash: No Hemosiderin Staining: Yes Scarring: Yes Mottled: No Pallor: No Moisture Rubor: No No Abnormalities Noted: No Dry / Scaly: No Temperature / Pain Maceration: No Temperature: No Abnormality Treatment Notes Wound #11 (Lower Leg) Wound Laterality: Left, Proximal Cleanser Soap and Water Discharge Instruction: May shower and wash wound with dial antibacterial soap and water prior to dressing change. Vashe 5.8 (oz) Discharge Instruction: Cleanse the wound with Vashe prior to applying a clean dressing using gauze sponges, not tissue or cotton balls. Peri-Wound Care Sween Lotion (Moisturizing lotion) Discharge Instruction: Apply moisturizing lotion as directed Topical Primary Dressing Hydrofera Blue Ready Transfer Foam, 2.5x2.5 (in/in) Discharge Instruction: Apply directly to wound bed as directed Santyl Ointment Discharge Instruction: Apply nickel thick amount to wound bed as instructed MALAKA, RIZVI ([id number]) [id number] Page 11 of 13 Secondary Dressing ABD Pad, 8x10 Discharge Instruction: Apply over primary dressing as directed. Woven Gauze Sponge, Non-Sterile 4x4 in Discharge Instruction: Apply over primary dressing as directed. Secured  With Compression Wrap Urgo K2 Lite, (equivalent to a 3 layer) two layer  compression system, regular Discharge Instruction: Apply Urgo K2 Lite as directed (alternative to 3 layer compression). Compression Stockings Add-Ons Electronic Signature(s) Signed: 07/16/2023 4:28:55 PM By: [name] Entered By: [name] on 07/16/2023 13:50:55 -------------------------------------------------------------------------------- Wound Assessment Details Patient Name: Date of Service: [name], CURTI 07/16/2023 1:30 PM Medical Record Number: [id number] Patient Account Number: [id number] Date of Birth/Sex: Treating RN: May 30, 1931 (89 y.o. [name] Primary Care Jonisha Kindig: [name] Other Clinician: Referring Traniece Boffa: Treating Freja Faro/Extender: [name]: 2 Wound Status Wound Number: 12 Primary Diabetic Wound/Ulcer of the Lower Extremity Etiology: Wound Location: Left, Distal, Anterior Lower Leg Secondary Lymphedema Wounding Event: Blister Etiology: Date Acquired: 06/12/2023 Wound Open Weeks Of Treatment: 2 Status: Clustered Wound: No Comorbid Anemia, Congestive Heart Failure, Hypertension, Peripheral History: Venous Disease, Type II Diabetes, Osteoarthritis, Neuropathy Photos Wound Measurements Length: (cm) 1.8 Width: (cm) 2 Depth: (cm) 0.1 Area: (cm) 2.827 Volume: (cm) 0.283 % Reduction in Area: 25.5% % Reduction in Volume: 25.3% Epithelialization: Small (1-33%) Wound Description Classification: Grade 1 Wound Margin: Distinct, outline attached Exudate Amount: Medium Exudate Type: Serosanguineous Exudate Color: red, brown Lenart, Myia G ([id number]) Wound Bed Granulation Amount: Medium (34-66%) Granulation Quality: Pink, Pale Necrotic Amount: Medium (34-66%) Necrotic Quality: Adherent Slough Foul Odor After Cleansing: No Slough/Fibrino Yes [id number] Page 12 of  13 Exposed Structure Fascia Exposed: No Fat Layer (Subcutaneous Tissue) Exposed: No Tendon Exposed: No Muscle Exposed: No Joint Exposed: No Bone Exposed: No Periwound Skin Texture Texture Color No Abnormalities Noted: No No Abnormalities Noted: No Callus: No Atrophie Blanche: No Crepitus: No Cyanosis: No Excoriation: No Ecchymosis: No Induration: No Erythema: No Rash: No Hemosiderin Staining: Yes Scarring: Yes Mottled: No Pallor: No Moisture Rubor: No No Abnormalities Noted: No Dry / Scaly: No Temperature / Pain Maceration: No Temperature: No Abnormality Treatment Notes Wound #12 (Lower Leg) Wound Laterality: Left, Anterior, Distal Cleanser Soap and Water Discharge Instruction: May shower and wash wound with dial antibacterial soap and water prior to dressing change. Vashe 5.8 (oz) Discharge Instruction: Cleanse the wound with Vashe prior to applying a clean dressing using gauze sponges, not tissue or cotton balls. Peri-Wound Care Sween Lotion (Moisturizing lotion) Discharge Instruction: Apply moisturizing lotion as directed Topical Primary Dressing Hydrofera Blue Ready Transfer Foam, 2.5x2.5 (in/in) Discharge Instruction: Apply directly to wound bed as directed Santyl Ointment Discharge Instruction: Apply nickel thick amount to wound bed as instructed Secondary Dressing ABD Pad, 8x10 Discharge Instruction: Apply over primary dressing as directed. Woven Gauze Sponge, Non-Sterile 4x4 in Discharge Instruction: Apply over primary dressing as directed. Secured With Compression Wrap Urgo K2 Lite, (equivalent to a 3 layer) two layer compression system, regular Discharge Instruction: Apply Urgo K2 Lite as directed (alternative to 3 layer compression). Compression Stockings Add-Ons Electronic Signature(s) Signed: 07/16/2023 4:28:55 PM By: [name] Entered By: [name] on 07/16/2023 13:41:37 Valentino Saxon, Emelia [name] ([id number])  [id number] Page 13 of 13 -------------------------------------------------------------------------------- Vitals Details Patient Name: Date of Service: LOVETTA, HOUFEK 07/16/2023 1:30 PM Medical Record Number: [id number] Patient Account Number: [id number] Date of Birth/Sex: Treating RN: 11/14/1931 (89 y.o. [name] Primary Care Zeidy Tayag: [name] Other Clinician: Referring Eloy Fehl: Treating Kaylynne Andres/Extender: Loetta Rough in Treatment: 2 Vital Signs Time Taken: 13:38 Temperature (F): 98.4 Height (in): 62 Pulse (bpm): 63 Weight (lbs): 220 Respiratory Rate (breaths/min): 20 Body Mass Index (BMI): 40.2 Blood Pressure (mmHg): 109/70 Reference Range: 80 - 120 mg / dl Electronic Signature(s) Signed: 07/16/2023 4:28:55 PM By: Ander Slade,  Ladona Ridgel Entered By: [name] on 07/16/2023 13:39:28

## 2023-07-16 NOTE — Progress Notes (Signed)
AJAI, MELLOTT ([id number]) [id number] Page 1 of 10 Visit Report for 07/16/2023 Chief Complaint Document Details Patient Name: Date of Service: [name], [name] 07/16/2023 1:30 PM Medical Record Number: [id number] Patient Account Number: [id number] Date of Birth/Sex: Treating RN: 08-Jul-1931 (89 y.o. F) Primary Care Provider: [name] Other Clinician: Referring Provider: Treating Provider/Extender: Loetta Rough in Treatment: 2 Information Obtained from: Patient Chief Complaint 07/02/2023; bilateral lower extremity wounds Electronic Signature(s) Signed: 07/16/2023 4:37:08 PM By: Geralyn Corwin DO Entered By: Geralyn Corwin on 07/16/2023 14:34:40 -------------------------------------------------------------------------------- Debridement Details Patient Name: Date of Service: [name] 07/16/2023 1:30 PM Medical Record Number: [id number] Patient Account Number: [id number] Date of Birth/Sex: Treating RN: 1931/07/02 (89 y.o. Caro Hight, Ladona Ridgel Primary Care Provider: [name] Other Clinician: Referring Provider: Treating Provider/Extender: Loetta Rough in Treatment: 2 Debridement Performed for Assessment: Wound #12 Left,Distal,Anterior Lower Leg Performed By: Physician Geralyn Corwin, DO Debridement Type: Debridement Severity of Tissue Pre Debridement: Fat layer exposed Level of Consciousness (Pre-procedure): Awake and Alert Pre-procedure Verification/Time Out Yes - 13:48 Taken: Start Time: 13:48 Pain Control: Lidocaine 4% T opical Solution Percent of Wound Bed Debrided: 100% T Area Debrided (cm): otal 2.83 Tissue and other material debrided: Non-Viable, Slough, Slough Level: Non-Viable Tissue Debridement Description: Selective/Open Wound Instrument: Curette Bleeding: Minimum Hemostasis Achieved: Pressure Response to Treatment: Procedure was tolerated  well Level of Consciousness (Post- Awake and Alert procedure): Post Debridement Measurements of Total Wound Length: (cm) 1.8 Width: (cm) 2 Depth: (cm) 0.1 Volume: (cm) 0.283 Character of Wound/Ulcer Post Debridement: Improved Severity of Tissue Post Debridement: Fat layer exposed Post Procedure Diagnosis [name], [name] ([id number]) [id number] Page 2 of 10 Same as Pre-procedure Notes scribed for Dr. Mikey Bussing by Samuella Bruin, RN Electronic Signature(s) Signed: 07/16/2023 4:28:55 PM By: Samuella Bruin Signed: 07/16/2023 4:37:08 PM By: Geralyn Corwin DO Entered By: Samuella Bruin on 07/16/2023 13:48:47 -------------------------------------------------------------------------------- Debridement Details Patient Name: Date of Service: [name] 07/16/2023 1:30 PM Medical Record Number: [id number] Patient Account Number: [id number] Date of Birth/Sex: Treating RN: March 28, 1931 (89 y.o. Caro Hight, Ladona Ridgel Primary Care Provider: [name] Other Clinician: Referring Provider: Treating Provider/Extender: Loetta Rough in Treatment: 2 Debridement Performed for Assessment: Wound #11 Left,Proximal Lower Leg Performed By: Physician Geralyn Corwin, DO Debridement Type: Debridement Severity of Tissue Pre Debridement: Fat layer exposed Level of Consciousness (Pre-procedure): Awake and Alert Pre-procedure Verification/Time Out Yes - 13:48 Taken: Start Time: 13:48 Pain Control: Lidocaine 4% T opical Solution Percent of Wound Bed Debrided: 100% T Area Debrided (cm): otal 0.22 Tissue and other material debrided: Non-Viable, Slough, Slough Level: Non-Viable Tissue Debridement Description: Selective/Open Wound Instrument: Curette Bleeding: Minimum Hemostasis Achieved: Pressure Response to Treatment: Procedure was tolerated well Level of Consciousness (Post- Awake and Alert procedure): Post Debridement  Measurements of Total Wound Length: (cm) 0.7 Width: (cm) 0.4 Depth: (cm) 0.1 Volume: (cm) 0.022 Character of Wound/Ulcer Post Debridement: Improved Severity of Tissue Post Debridement: Fat layer exposed Post Procedure Diagnosis Same as Pre-procedure Notes scribed for Dr. Mikey Bussing by Samuella Bruin, RN Electronic Signature(s) Signed: 07/16/2023 4:28:55 PM By: Samuella Bruin Signed: 07/16/2023 4:37:08 PM By: Geralyn Corwin DO Entered By: Samuella Bruin on 07/16/2023 13:51:36 Hestand, [name] ([id number]) [id number] Page 3 of 10 -------------------------------------------------------------------------------- HPI Details Patient Name: Date of Service: [name], [name] 07/16/2023 1:30 PM Medical Record Number: [id number] Patient Account Number: [id number] Date of Birth/Sex: Treating RN: 08/18/1931 (89 y.o. F) Primary Care Provider: [name] Other  Clinician: Referring Provider: Treating Provider/Extender: Loetta Rough in Treatment: 2 History of Present Illness HPI Description: 07/02/2023 Ms. 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 She is not quite sure how the right upper leg wound started. The left lower extremity wound started out as blisters. She has compression stockings but has not been wearing them. She takes Lasix 20 mg daily. She currently denies signs of infection. In office ABIs were 0.57 on the left however dorsalis pedal pulses and posterior tibialis pulses were heard on Doppler. 8/6; patient presents for follow-up. We have been using antibiotic ointment with Hydrofera Blue under Kerlix/Coban to the left lower extremity. Hydrofera Blue to the right lower leg.  She came in for nurse visit and wrap change. She reports no issues. She reports that she is scheduled for her ABIs next week. 8/13; patient presents for follow-up. We have been using Hydrofera Blue and Santyl to the left lower extremity under Kerlix/Coban. She has been using Hydrofera Blue to the right upper leg wound. She had ABIs completed yesterday that showed an ABI of 0.91 on the left and 0.92 on the right. She has no issues or complaints today. Electronic Signature(s) Signed: 07/16/2023 4:37:08 PM By: Geralyn Corwin DO Entered By: Geralyn Corwin on 07/16/2023 14:36:05 -------------------------------------------------------------------------------- Physical Exam Details Patient Name: Date of Service: [name], [name] 07/16/2023 1:30 PM Medical Record Number: [id number] Patient Account Number: [id number] Date of Birth/Sex: Treating RN: 1931/07/26 (89 y.o. F) Primary Care Provider: [name] Other Clinician: Referring Provider: Treating Provider/Extender: [name]: 2 Constitutional respirations regular, non-labored and within target range for patient.. Cardiovascular 2+ dorsalis pedis/posterior tibialis pulses. Psychiatric pleasant and cooperative. Notes Right lower extremity: T the inner medial upper leg where the knee bends there is A small open wound with fibrotic tissue but epithelization occurring to the o edges circumferentially. Left lower extremity: T the anterior aspect there are 3 open wounds with fibrinous tissue/Slough and granulation tissue. Decent edema o control. Electronic Signature(s) Signed: 07/16/2023 4:37:08 PM By: Geralyn Corwin DO Entered By: Geralyn Corwin on 07/16/2023 14:37:16 Physician Orders Details -------------------------------------------------------------------------------- [name] ([id number]) [id number] Page 4 of 10 Patient Name: Date of  Service: [name], [name] 07/16/2023 1:30 PM Medical Record Number: [id number] Patient Account Number: [id number] Date of Birth/Sex: Treating RN: 17-Dec-1930 (89 y.o. [name] Primary Care Provider: [name] Other Clinician: Referring Provider: Treating Provider/Extender: Loetta Rough in Treatment: 2 Verbal / Phone Orders: No Diagnosis Coding Follow-up Appointments ppointment in 1 week. - Dr. Mikey Bussing Tuesday Return A Room 8 Return Appointment in 2 weeks. Anesthetic (In clinic) Topical Lidocaine 4% applied to wound bed Bathing/ Shower/ Hygiene May shower with protection but do not get wound dressing(s) wet. Protect dressing(s) with water repellant cover (for example, large plastic bag) or a cast cover and may then take shower. Edema Control - Lymphedema / SCD / Other Elevate legs to the level of the heart or above for 30 minutes daily and/or when sitting for 3-4 times a day throughout the day. Avoid standing for long periods of time. Additional Orders / Instructions Follow Nutritious Diet - increase protein Juven Shake 1-2 times daily. Wound Treatment Wound #10 - Upper Leg Wound Laterality: Right, Medial Cleanser:  Soap and Water 1 x Per Day/30 Days Discharge Instructions: May shower and wash wound with dial antibacterial soap and water prior to dressing change. Cleanser: Byram Ancillary Kit - 15 Day Supply (Generic) 1 x Per Day/30 Days Discharge Instructions: Use supplies as instructed; Kit contains: (15) Saline Bullets; (15) 3x3 Gauze; 15 pr Gloves Peri-Wound Care: Skin Prep (Generic) 1 x Per Day/30 Days Discharge Instructions: Use skin prep as directed Prim Dressing: Hydrofera Blue Ready Transfer Foam, 2.5x2.5 (in/in) (Generic) 1 x Per Day/30 Days ary Discharge Instructions: Apply directly to wound bed as directed Secondary Dressing: Zetuvit Plus Silicone Border Dressing 4x4 (in/in) (Generic) 1 x Per Day/30 Days Discharge  Instructions: Apply silicone border over primary dressing as directed. Wound #11 - Lower Leg Wound Laterality: Left, Proximal Cleanser: Soap and Water 1 x Per Week/30 Days Discharge Instructions: May shower and wash wound with dial antibacterial soap and water prior to dressing change. Cleanser: Vashe 5.8 (oz) 1 x Per Week/30 Days Discharge Instructions: Cleanse the wound with Vashe prior to applying a clean dressing using gauze sponges, not tissue or cotton balls. Peri-Wound Care: Sween Lotion (Moisturizing lotion) 1 x Per Week/30 Days Discharge Instructions: Apply moisturizing lotion as directed Prim Dressing: Hydrofera Blue Ready Transfer Foam, 2.5x2.5 (in/in) 1 x Per Week/30 Days ary Discharge Instructions: Apply directly to wound bed as directed Prim Dressing: Santyl Ointment 1 x Per Week/30 Days ary Discharge Instructions: Apply nickel thick amount to wound bed as instructed Secondary Dressing: ABD Pad, 8x10 1 x Per Week/30 Days Discharge Instructions: Apply over primary dressing as directed. Secondary Dressing: Woven Gauze Sponge, Non-Sterile 4x4 in 1 x Per Week/30 Days Discharge Instructions: Apply over primary dressing as directed. Compression Wrap: Urgo K2 Lite, (equivalent to a 3 layer) two layer compression system, regular 1 x Per Week/30 Days Discharge Instructions: Apply Urgo K2 Lite as directed (alternative to 3 layer compression). Wound #12 - Lower Leg Wound Laterality: Left, Anterior, Distal Cleanser: Soap and Water 1 x Per Week/30 Days Discharge Instructions: May shower and wash wound with dial antibacterial soap and water prior to dressing change. Cleanser: Vashe 5.8 (oz) 1 x Per Week/30 Days [name], [name] ([id number]) [phone].pdf Page 5 of 10 Discharge Instructions: Cleanse the wound with Vashe prior to applying a clean dressing using gauze sponges, not tissue or cotton balls. Peri-Wound Care: Sween Lotion (Moisturizing lotion) 1 x Per  Week/30 Days Discharge Instructions: Apply moisturizing lotion as directed Prim Dressing: Hydrofera Blue Ready Transfer Foam, 2.5x2.5 (in/in) 1 x Per Week/30 Days ary Discharge Instructions: Apply directly to wound bed as directed Prim Dressing: Santyl Ointment 1 x Per Week/30 Days ary Discharge Instructions: Apply nickel thick amount to wound bed as instructed Secondary Dressing: ABD Pad, 8x10 1 x Per Week/30 Days Discharge Instructions: Apply over primary dressing as directed. Secondary Dressing: Woven Gauze Sponge, Non-Sterile 4x4 in 1 x Per Week/30 Days Discharge Instructions: Apply over primary dressing as directed. Compression Wrap: Urgo K2 Lite, (equivalent to a 3 layer) two layer compression system, regular 1 x Per Week/30 Days Discharge Instructions: Apply Urgo K2 Lite as directed (alternative to 3 layer compression). Patient Medications llergies: lisinopril A Notifications Medication Indication Start End 07/16/2023 lidocaine DOSE topical 4 % cream - cream topical Electronic Signature(s) Signed: 07/16/2023 4:37:08 PM By: Geralyn Corwin DO Entered By: Geralyn Corwin on 07/16/2023 14:37:27 -------------------------------------------------------------------------------- Problem List Details Patient Name: Date of Service: [name] 07/16/2023 1:30 PM Medical Record Number: [id number] Patient Account Number: [id number] Date of Birth/Sex: Treating RN:  [birth year] (89 y.o. F) Primary Care Provider: [name] Other Clinician: Referring Provider: Treating Provider/Extender: Loetta Rough in Treatment: 2 Active Problems ICD-10 Encounter Code Description Active Date MDM Diagnosis 3050422989 Non-pressure chronic ulcer of other part of right lower leg with fat layer 07/02/2023 No Yes exposed L97.822 Non-pressure chronic ulcer of other part of left lower leg with fat layer exposed7/30/2024 No Yes I89.0 Lymphedema, not elsewhere  classified 07/02/2023 No Yes E11.622 Type 2 diabetes mellitus with other skin ulcer 07/02/2023 No Yes I50.42 Chronic combined systolic (congestive) and diastolic (congestive) heart failure 07/02/2023 No Yes AMEEYAH, BLAUM ([id number]) [id number] Page 6 of 10 Inactive Problems Resolved Problems Electronic Signature(s) Signed: 07/16/2023 4:37:08 PM By: Geralyn Corwin DO Entered By: Geralyn Corwin on 07/16/2023 14:34:25 -------------------------------------------------------------------------------- Progress Note Details Patient Name: Date of Service: [name] 07/16/2023 1:30 PM Medical Record Number: [id number] Patient Account Number: [id number] Date of Birth/Sex: Treating RN: 1931/04/22 (89 y.o. F) Primary Care Provider: [name] Other Clinician: Referring Provider: Treating Provider/Extender: Loetta Rough in Treatment: 2 Subjective Chief Complaint Information obtained from Patient 07/02/2023; bilateral lower extremity wounds History of Present Illness (HPI) 07/02/2023 Ms. 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 She is not quite sure how the right upper leg wound started. The left lower extremity wound started out as blisters. She has compression stockings but has not been wearing them. She takes Lasix 20 mg daily. She currently denies signs of infection. In office ABIs were 0.57 on the left however dorsalis pedal pulses and posterior tibialis pulses were heard on Doppler. 8/6; patient presents for follow-up. We have been using antibiotic ointment with Hydrofera Blue under Kerlix/Coban to the left lower extremity. Hydrofera Blue to the right lower leg. She came in for  nurse visit and wrap change. She reports no issues. She reports that she is scheduled for her ABIs next week. 8/13; patient presents for follow-up. We have been using Hydrofera Blue and Santyl to the left lower extremity under Kerlix/Coban. She has been using Hydrofera Blue to the right upper leg wound. She had ABIs completed yesterday that showed an ABI of 0.91 on the left and 0.92 on the right. She has no issues or complaints today. Patient History Medical History Hematologic/Lymphatic Patient has history of Anemia Cardiovascular Patient has history of Congestive Heart Failure, Hypertension, Peripheral Venous Disease Endocrine Patient has history of Type II Diabetes Musculoskeletal Patient has history of Osteoarthritis Neurologic Patient has history of Neuropathy Medical A Surgical History Notes nd Cardiovascular hyperlipidemia, venous stasis Gastrointestinal GERD Psychiatric generalized anxiety Objective [name], [name] ([id number]) [id number] Page 7 of 10 Constitutional respirations regular, non-labored and within target range for patient.. Vitals Time Taken: 1:38 PM, Height: 62 in, Weight: 220 lbs, BMI: 40.2, Temperature: 98.4 F, Pulse: 63 bpm, Respiratory Rate: 20 breaths/min, Blood Pressure: 109/70 mmHg. Cardiovascular 2+ dorsalis pedis/posterior tibialis pulses. Psychiatric pleasant and cooperative. General Notes: Right lower extremity: T the inner medial upper leg where the knee bends there is A small open wound with fibrotic tissue but epithelization o occurring to the edges circumferentially. Left lower extremity: T the anterior aspect there are 3 open wounds with fibrinous tissue/Slough and granulation o tissue. Decent edema control. Integumentary (Hair, Skin) Wound #10 status is Open. Original cause  of wound was Blister. The date acquired was: 06/12/2023. The wound has been in treatment 2 weeks. The wound is located on the  Right,Medial Upper Leg. The wound measures 0.5cm length x 0.3cm width x 0.1cm depth; 0.118cm^2 area and 0.012cm^3 volume. There is Fat Layer (Subcutaneous Tissue) exposed. There is a medium amount of serosanguineous drainage noted. The wound margin is distinct with the outline attached to the wound base. There is small (1-33%) pink granulation within the wound bed. There is a large (67-100%) amount of necrotic tissue within the wound bed including Adherent Slough. The periwound skin appearance exhibited: Scarring. The periwound skin appearance did not exhibit: Callus, Crepitus, Excoriation, Induration, Rash, Dry/Scaly, Maceration, Atrophie Blanche, Cyanosis, Ecchymosis, Hemosiderin Staining, Mottled, Pallor, Rubor, Erythema. Periwound temperature was noted as No Abnormality. Wound #11 status is Open. Original cause of wound was Bite. The date acquired was: 06/12/2023. The wound has been in treatment 2 weeks. The wound is located on the Left,Proximal Lower Leg. The wound measures 0.7cm length x 0.4cm width x 0.1cm depth; 0.22cm^2 area and 0.022cm^3 volume. There is Fat Layer (Subcutaneous Tissue) exposed. There is a medium amount of serosanguineous drainage noted. The wound margin is distinct with the outline attached to the wound base. There is medium (34-66%) red granulation within the wound bed. There is a medium (34-66%) amount of necrotic tissue within the wound bed. The periwound skin appearance exhibited: Scarring, Hemosiderin Staining. The periwound skin appearance did not exhibit: Callus, Crepitus, Excoriation, Induration, Rash, Dry/Scaly, Maceration, Atrophie Blanche, Cyanosis, Ecchymosis, Mottled, Pallor, Rubor, Erythema. Periwound temperature was noted as No Abnormality. Wound #12 status is Open. Original cause of wound was Blister. The date acquired was: 06/12/2023. The wound has been in treatment 2 weeks. The wound is located on the [facility] Lower Leg. The wound measures 1.8cm  length x 2cm width x 0.1cm depth; 2.827cm^2 area and 0.283cm^3 volume. There is a medium amount of serosanguineous drainage noted. The wound margin is distinct with the outline attached to the wound base. There is medium (34-66%) pink, pale granulation within the wound bed. There is a medium (34-66%) amount of necrotic tissue within the wound bed including Adherent Slough. The periwound skin appearance exhibited: Scarring, Hemosiderin Staining. The periwound skin appearance did not exhibit: Callus, Crepitus, Excoriation, Induration, Rash, Dry/Scaly, Maceration, Atrophie Blanche, Cyanosis, Ecchymosis, Mottled, Pallor, Rubor, Erythema. Periwound temperature was noted as No Abnormality. Assessment Active Problems ICD-10 Non-pressure chronic ulcer of other part of right lower leg with fat layer exposed Non-pressure chronic ulcer of other part of left lower leg with fat layer exposed Lymphedema, not elsewhere classified Type 2 diabetes mellitus with other skin ulcer Chronic combined systolic (congestive) and diastolic (congestive) heart failure Patient's wounds appear well-healing. I debrided nonviable tissue. I recommended continuing the course with Hydrofera Blue and Santyl but going up on the compression since her ABIs are normal to 3 layer on the left. T the right lower extremity I recommended continuing Hydrofera Blue. Follow-up in 1 week. o Procedures Wound #11 Pre-procedure diagnosis of Wound #11 is a Diabetic Wound/Ulcer of the Lower Extremity located on the Left,Proximal Lower Leg .Severity of Tissue Pre Debridement is: Fat layer exposed. There was a Selective/Open Wound Non-Viable Tissue Debridement with a total area of 0.22 sq cm performed by Geralyn Corwin, DO. With the following instrument(s): Curette to remove Non-Viable tissue/material. Material removed includes [employer] after achieving pain control using Lidocaine 4% Topical Solution. No specimens were taken. A time out was conducted  at 13:48, prior to  the start of the procedure. A Minimum amount of bleeding was controlled with Pressure. The procedure was tolerated well. Post Debridement Measurements: 0.7cm length x 0.4cm width x 0.1cm depth; 0.022cm^3 volume. Character of Wound/Ulcer Post Debridement is improved. Severity of Tissue Post Debridement is: Fat layer exposed. Post procedure Diagnosis Wound #11: Same as Pre-Procedure General Notes: scribed for Dr. Mikey Bussing by Samuella Bruin, RN. Pre-procedure diagnosis of Wound #11 is a Diabetic Wound/Ulcer of the Lower Extremity located on the Left,Proximal Lower Leg . There was a Double Layer Compression Therapy Procedure by Burt Ek, RN. Post procedure Diagnosis Wound #11: Same as Pre-Procedure Wound #12 Pre-procedure diagnosis of Wound #12 is a Diabetic Wound/Ulcer of the Lower Extremity located on the Left,Distal,Anterior Lower Leg .Severity of Tissue Pre Debridement is: Fat layer exposed. There was a Selective/Open Wound Non-Viable Tissue Debridement with a total area of 2.83 sq cm performed by Geralyn Corwin, DO. With the following instrument(s): Curette to remove Non-Viable tissue/material. Material removed includes [facility] after achieving pain HIMANI, [name] ([id number]) [id number] Page 8 of 10 control using Lidocaine 4% Topical Solution. No specimens were taken. A time out was conducted at 13:48, prior to the start of the procedure. A Minimum amount of bleeding was controlled with Pressure. The procedure was tolerated well. Post Debridement Measurements: 1.8cm length x 2cm width x 0.1cm depth; 0.283cm^3 volume. Character of Wound/Ulcer Post Debridement is improved. Severity of Tissue Post Debridement is: Fat layer exposed. Post procedure Diagnosis Wound #12: Same as Pre-Procedure General Notes: scribed for Dr. Mikey Bussing by Samuella Bruin, RN. Plan Follow-up Appointments: Return Appointment in 1 week. - Dr. Mikey Bussing Tuesday  Room 8 Return Appointment in 2 weeks. Anesthetic: (In clinic) Topical Lidocaine 4% applied to wound bed Bathing/ Shower/ Hygiene: May shower with protection but do not get wound dressing(s) wet. Protect dressing(s) with water repellant cover (for example, large plastic bag) or a cast cover and may then take shower. Edema Control - Lymphedema / SCD / Other: Elevate legs to the level of the heart or above for 30 minutes daily and/or when sitting for 3-4 times a day throughout the day. Avoid standing for long periods of time. Additional Orders / Instructions: Follow Nutritious Diet - increase protein Juven Shake 1-2 times daily. The following medication(s) was prescribed: lidocaine topical 4 % cream cream topical was prescribed at facility WOUND #10: - Upper Leg Wound Laterality: Right, Medial Cleanser: Soap and Water 1 x Per Day/30 Days Discharge Instructions: May shower and wash wound with dial antibacterial soap and water prior to dressing change. Cleanser: Byram Ancillary Kit - 15 Day Supply (Generic) 1 x Per Day/30 Days Discharge Instructions: Use supplies as instructed; Kit contains: (15) Saline Bullets; (15) 3x3 Gauze; 15 pr Gloves Peri-Wound Care: Skin Prep (Generic) 1 x Per Day/30 Days Discharge Instructions: Use skin prep as directed Prim Dressing: Hydrofera Blue Ready Transfer Foam, 2.5x2.5 (in/in) (Generic) 1 x Per Day/30 Days ary Discharge Instructions: Apply directly to wound bed as directed Secondary Dressing: Zetuvit Plus Silicone Border Dressing 4x4 (in/in) (Generic) 1 x Per Day/30 Days Discharge Instructions: Apply silicone border over primary dressing as directed. WOUND #11: - Lower Leg Wound Laterality: Left, Proximal Cleanser: Soap and Water 1 x Per Week/30 Days Discharge Instructions: May shower and wash wound with dial antibacterial soap and water prior to dressing change. Cleanser: Vashe 5.8 (oz) 1 x Per Week/30 Days Discharge Instructions: Cleanse the wound with  Vashe prior to applying a clean dressing using gauze sponges, not tissue or  cotton balls. Peri-Wound Care: Sween Lotion (Moisturizing lotion) 1 x Per Week/30 Days Discharge Instructions: Apply moisturizing lotion as directed Prim Dressing: Hydrofera Blue Ready Transfer Foam, 2.5x2.5 (in/in) 1 x Per Week/30 Days ary Discharge Instructions: Apply directly to wound bed as directed Prim Dressing: Santyl Ointment 1 x Per Week/30 Days ary Discharge Instructions: Apply nickel thick amount to wound bed as instructed Secondary Dressing: ABD Pad, 8x10 1 x Per Week/30 Days Discharge Instructions: Apply over primary dressing as directed. Secondary Dressing: Woven Gauze Sponge, Non-Sterile 4x4 in 1 x Per Week/30 Days Discharge Instructions: Apply over primary dressing as directed. Com pression Wrap: Urgo K2 Lite, (equivalent to a 3 layer) two layer compression system, regular 1 x Per Week/30 Days Discharge Instructions: Apply Urgo K2 Lite as directed (alternative to 3 layer compression). WOUND #12: - Lower Leg Wound Laterality: Left, Anterior, Distal Cleanser: Soap and Water 1 x Per Week/30 Days Discharge Instructions: May shower and wash wound with dial antibacterial soap and water prior to dressing change. Cleanser: Vashe 5.8 (oz) 1 x Per Week/30 Days Discharge Instructions: Cleanse the wound with Vashe prior to applying a clean dressing using gauze sponges, not tissue or cotton balls. Peri-Wound Care: Sween Lotion (Moisturizing lotion) 1 x Per Week/30 Days Discharge Instructions: Apply moisturizing lotion as directed Prim Dressing: Hydrofera Blue Ready Transfer Foam, 2.5x2.5 (in/in) 1 x Per Week/30 Days ary Discharge Instructions: Apply directly to wound bed as directed Prim Dressing: Santyl Ointment 1 x Per Week/30 Days ary Discharge Instructions: Apply nickel thick amount to wound bed as instructed Secondary Dressing: ABD Pad, 8x10 1 x Per Week/30 Days Discharge Instructions: Apply over primary  dressing as directed. Secondary Dressing: Woven Gauze Sponge, Non-Sterile 4x4 in 1 x Per Week/30 Days Discharge Instructions: Apply over primary dressing as directed. Com pression Wrap: Urgo K2 Lite, (equivalent to a 3 layer) two layer compression system, regular 1 x Per Week/30 Days Discharge Instructions: Apply Urgo K2 Lite as directed (alternative to 3 layer compression). 1. In office sharp debridement 2. Hydrofera Blue and Santyl under 3 layer compression to the left lower extremity 3. Hydrofera Blue to the right lower extremity 4. Follow-up in 1 week Electronic Signature(s) Signed: 07/16/2023 4:37:08 PM By: Geralyn Corwin DO Entered By: Geralyn Corwin on 07/16/2023 14:40:37 [name] ([id number]) [id number] Page 9 of 10 -------------------------------------------------------------------------------- HxROS Details Patient Name: Date of Service: [name], [name] 07/16/2023 1:30 PM Medical Record Number: [id number] Patient Account Number: [id number] Date of Birth/Sex: Treating RN: 17-Mar-1931 (89 y.o. F) Primary Care Provider: [name] Other Clinician: Referring Provider: Treating Provider/Extender: Loetta Rough in Treatment: 2 Hematologic/Lymphatic Medical History: Positive for: Anemia Cardiovascular Medical History: Positive for: Congestive Heart Failure; Hypertension; Peripheral Venous Disease Past Medical History Notes: hyperlipidemia, venous stasis Gastrointestinal Medical History: Past Medical History Notes: GERD Endocrine Medical History: Positive for: Type II Diabetes Musculoskeletal Medical History: Positive for: Osteoarthritis Neurologic Medical History: Positive for: Neuropathy Psychiatric Medical History: Past Medical History Notes: generalized anxiety Immunizations Pneumococcal Vaccine: Received Pneumococcal Vaccination: Yes Received Pneumococcal Vaccination On or After  60th Birthday: Yes Implantable Devices No devices added Electronic Signature(s) Signed: 07/16/2023 4:37:08 PM By: Geralyn Corwin DO Entered By: Geralyn Corwin on 07/16/2023 14:36:11 [name], [name] ([id number]) [id number] Page 10 of 10 -------------------------------------------------------------------------------- SuperBill Details Patient Name: Date of Service: [name], [name] 07/16/2023 Medical Record Number: [id number] Patient Account Number: [id number] Date of Birth/Sex: Treating RN: [birth year] (89 y.o. F) Primary Care Provider: [name] Other Clinician: Referring Provider:  Treating Provider/Extender: [name]: 2 Diagnosis Coding ICD-10 Codes Code Description [id number] Non-pressure chronic ulcer of other part of right lower leg with fat layer exposed L97.822 Non-pressure chronic ulcer of other part of left lower leg with fat layer exposed I89.0 Lymphedema, not elsewhere classified E11.622 Type 2 diabetes mellitus with other skin ulcer I50.42 Chronic combined systolic (congestive) and diastolic (congestive) heart failure Facility Procedures : CPT4 Code: [id number] Description: 97597 - DEBRIDE WOUND 1ST 20 SQ CM OR < ICD-10 Diagnosis Description L97.812 Non-pressure chronic ulcer of other part of right lower leg with fat layer expos E11.622 Type 2 diabetes mellitus with other skin ulcer I89.0 Lymphedema, not  elsewhere classified Modifier: ed Quantity: 1 Physician Procedures : CPT4 Code Description Modifier [id number] 99213 - WC PHYS LEVEL 3 - EST PT 25 ICD-10 Diagnosis Description L97.812 Non-pressure chronic ulcer of other part of right lower leg with fat layer exposed I89.0 Lymphedema, not elsewhere classified E11.622 Type 2  diabetes mellitus with other skin ulcer I50.42 Chronic combined systolic (congestive) and diastolic (congestive) heart failure Quantity: 1 : [id number] 97597 - WC PHYS  DEBR WO ANESTH 20 SQ CM ICD-10 Diagnosis Description L97.812 Non-pressure chronic ulcer of other part of right lower leg with fat layer exposed E11.622 Type 2 diabetes mellitus with other skin ulcer I89.0 Lymphedema, not  elsewhere classified Quantity: 1 Electronic Signature(s) Signed: 07/16/2023 4:37:08 PM By: Geralyn Corwin DO Entered By: Geralyn Corwin on 07/16/2023 14:41:07

## 2023-07-17 ENCOUNTER — Encounter (HOSPITAL_COMMUNITY): Payer: Medicare PPO

## 2023-07-23 ENCOUNTER — Encounter (HOSPITAL_BASED_OUTPATIENT_CLINIC_OR_DEPARTMENT_OTHER): Payer: Medicare PPO | Admitting: Internal Medicine

## 2023-07-23 DIAGNOSIS — L97822 Non-pressure chronic ulcer of other part of left lower leg with fat layer exposed: Secondary | ICD-10-CM | POA: Diagnosis not present

## 2023-07-23 DIAGNOSIS — E11622 Type 2 diabetes mellitus with other skin ulcer: Secondary | ICD-10-CM | POA: Diagnosis not present

## 2023-07-30 ENCOUNTER — Encounter (HOSPITAL_BASED_OUTPATIENT_CLINIC_OR_DEPARTMENT_OTHER): Payer: Medicare PPO | Admitting: Internal Medicine

## 2023-07-30 DIAGNOSIS — E11622 Type 2 diabetes mellitus with other skin ulcer: Secondary | ICD-10-CM | POA: Diagnosis not present

## 2023-07-30 DIAGNOSIS — L97822 Non-pressure chronic ulcer of other part of left lower leg with fat layer exposed: Secondary | ICD-10-CM

## 2023-07-30 DIAGNOSIS — I890 Lymphedema, not elsewhere classified: Secondary | ICD-10-CM | POA: Diagnosis not present

## 2023-07-30 NOTE — Progress Notes (Signed)
OTILLIA, JAVORSKY ([id number]) [id number] Page 1 of 9 Visit Report for 07/23/2023 Chief Complaint Document Details Patient Name: Date of Service: [name], [name] 07/23/2023 10:15 A M Medical Record Number: [id number] Patient Account Number: [id number] Date of Birth/Sex: Treating RN: Feb 23, 1931 (89 y.o. F) Primary Care Provider: [name] Other Clinician: Referring Provider: Treating Provider/Extender: Loetta Rough in Treatment: 3 Information Obtained from: Patient Chief Complaint 07/02/2023; bilateral lower extremity wounds Electronic Signature(s) Signed: 07/23/2023 2:47:41 PM By: Geralyn Corwin DO Entered By: Geralyn Corwin on 07/23/2023 12:06:43 -------------------------------------------------------------------------------- Debridement Details Patient Name: Date of Service: [name] 07/23/2023 10:15 A M Medical Record Number: [id number] Patient Account Number: [id number] Date of Birth/Sex: Treating RN: 09-Feb-1931 (89 y.o. [name] Primary Care Provider: [name] Other Clinician: Referring Provider: Treating Provider/Extender: Loetta Rough in Treatment: 3 Debridement Performed for Assessment: Wound #12 Left,Distal,Anterior Lower Leg Performed By: Physician Geralyn Corwin, DO Debridement Type: Debridement Severity of Tissue Pre Debridement: Fat layer exposed Level of Consciousness (Pre-procedure): Awake and Alert Pre-procedure Verification/Time Out Yes - 12:02 Taken: Start Time: 12:05 Pain Control: Lidocaine 4% T opical Solution Percent of Wound Bed Debrided: 100% T Area Debrided (cm): otal 3.58 Tissue and other material debrided: Viable, Non-Viable, Slough, Subcutaneous, Slough Level: Skin/Subcutaneous Tissue Debridement Description: Excisional Instrument: Curette Bleeding: Minimum Hemostasis Achieved: Pressure End Time: 12:10 Procedural  Pain: 0 Post Procedural Pain: 0 Response to Treatment: Procedure was tolerated well Level of Consciousness (Post- Awake and Alert procedure): Post Debridement Measurements of Total Wound Length: (cm) 2.4 Width: (cm) 1.9 Depth: (cm) 0.1 Volume: (cm) 0.358 Character of Wound/Ulcer Post Debridement: Improved CHEA, WOOLWORTH ([id number]) [id number] Page 2 of 9 Severity of Tissue Post Debridement: Fat layer exposed Post Procedure Diagnosis Same as Pre-procedure Notes Scribed for Dr Mikey Bussing by Brenton Grills RN Electronic Signature(s) Signed: 07/23/2023 2:47:41 PM By: Geralyn Corwin DO Signed: 07/30/2023 2:03:55 PM By: Brenton Grills Entered By: Brenton Grills on 07/23/2023 12:03:50 -------------------------------------------------------------------------------- HPI Details Patient Name: Date of Service: [name] 07/23/2023 10:15 A M Medical Record Number: [id number] Patient Account Number: [id number] Date of Birth/Sex: Treating RN: 05-Apr-1931 (89 y.o. 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 She is not quite sure how the right upper leg wound started. The left lower extremity wound started out as blisters. She has compression stockings but has not been wearing them. She takes Lasix 20 mg daily. She currently denies signs of infection. In office ABIs were 0.57 on the left however dorsalis pedal pulses and posterior tibialis pulses were heard on Doppler. 8/6; patient presents for  follow-up. We have been using antibiotic ointment with Hydrofera Blue under Kerlix/Coban to the left lower extremity. Hydrofera Blue to the right lower leg. She came in for nurse visit and wrap change. She reports no issues. She reports that she is scheduled for her ABIs next week. 8/13; patient presents for follow-up. We have been using Hydrofera Blue and Santyl to the left lower extremity under Kerlix/Coban. She has been using Hydrofera Blue to the right upper leg wound. She had ABIs completed yesterday that showed an  ABI of 0.91 on the left and 0.92 on the right. She has no issues or complaints today. 8/20; patient presents for follow-up. We have been using Hydrofera Blue and Santyl to the left lower extremity under 3 layer compression and Hydrofera Blue to the right upper leg wound. The right upper leg wound has healed. She feels that the wrap was too tight with the 3 layer. We will go back down to Kerlix/Coban. Other than that she has no issues or complaints. She denies signs of infection. Wounds Appear smaller. Electronic Signature(s) Signed: 07/23/2023 2:47:41 PM By: Geralyn Corwin DO Entered By: Geralyn Corwin on 07/23/2023 12:07:26 -------------------------------------------------------------------------------- Physical Exam Details Patient Name: Date of Service: [name] 07/23/2023 10:15 A M Medical Record Number: [id number] Patient Account Number: [id number] Date of Birth/Sex: Treating RN: October 20, 1931 (89 y.o. F) Primary Care Provider: [name] Other Clinician: Referring Provider: Treating Provider/Extender: Loetta Rough in Treatment: 3 Constitutional respirations regular, non-labored and within target range for patient.Marland Kitchen Cardiovascular ALVEENA, DEUBLER ([id number]) [id number] Page 3 of 9 2+ dorsalis pedis/posterior tibialis pulses. Psychiatric pleasant and cooperative. Notes Right lower  extremity: T the inner medial upper leg where the knee bends there is Epithelization to the previous wound site. Left lower extremity: T the anterior o o aspect there are 2 open wounds with fibrinous tissue/Slough and granulation tissue. Decent edema control. Electronic Signature(s) Signed: 07/23/2023 2:47:41 PM By: Geralyn Corwin DO Entered By: Geralyn Corwin on 07/23/2023 12:08:15 -------------------------------------------------------------------------------- Physician Orders Details Patient Name: Date of Service: KELLIANNE, BOWHAY 07/23/2023 10:15 A M Medical Record Number: [id number] Patient Account Number: [id number] Date of Birth/Sex: Treating RN: 06/09/1931 (89 y.o. [name] Primary Care Provider: [name] Other Clinician: Referring Provider: Treating Provider/Extender: Loetta Rough in Treatment: 3 Verbal / Phone Orders: No Diagnosis Coding Follow-up Appointments ppointment in 1 week. - Dr. Mikey Bussing Tuesday Return A Room 8 Return Appointment in 2 weeks. Anesthetic (In clinic) Topical Lidocaine 4% applied to wound bed Bathing/ Shower/ Hygiene May shower with protection but do not get wound dressing(s) wet. Protect dressing(s) with water repellant cover (for example, large plastic bag) or a cast cover and may then take shower. Edema Control - Lymphedema / SCD / Other Elevate legs to the level of the heart or above for 30 minutes daily and/or when sitting for 3-4 times a day throughout the day. Avoid standing for long periods of time. Additional Orders / Instructions Follow Nutritious Diet - increase protein Juven Shake 1-2 times daily. Wound Treatment Wound #11 - Lower Leg Wound Laterality: Left, Proximal Cleanser: Soap and Water 1 x Per Week/30 Days Discharge Instructions: May shower and wash wound with dial antibacterial soap and water prior to dressing change. Cleanser: Vashe 5.8 (oz) 1 x Per Week/30 Days Discharge  Instructions: Cleanse the wound with Vashe prior to applying a clean dressing using gauze sponges, not tissue or cotton balls. Peri-Wound Care: Sween Lotion (Moisturizing lotion) 1 x Per Week/30 Days Discharge Instructions: Apply moisturizing lotion as directed Prim Dressing: Hydrofera Blue Ready Transfer Foam, 2.5x2.5 (in/in) 1 x Per Week/30 Days ary Discharge Instructions: Apply directly to wound bed as directed Prim Dressing: Santyl Ointment 1 x Per Week/30 Days ary Discharge Instructions: Apply nickel thick amount to wound bed as instructed Secondary Dressing: ABD Pad, 8x10 1 x Per Week/30 Days Discharge Instructions: Apply over primary dressing as directed. Secondary Dressing: Woven Gauze Sponge, Non-Sterile 4x4 in 1 x Per Week/30 Days Discharge Instructions:  Apply over primary dressing as directed. Compression Wrap: Kerlix Roll 4.5x3.1 (in/yd) 1 x Per Week/30 Days LAURELLA, SPRING ([id number]) [id number] Page 4 of 9 Discharge Instructions: Apply Kerlix and Coban compression as directed. Compression Wrap: Coban Self-Adherent Wrap 4x5 (in/yd) 1 x Per Week/30 Days Discharge Instructions: Apply over Kerlix as directed. Wound #12 - Lower Leg Wound Laterality: Left, Anterior, Distal Cleanser: Soap and Water 1 x Per Week/30 Days Discharge Instructions: May shower and wash wound with dial antibacterial soap and water prior to dressing change. Cleanser: Vashe 5.8 (oz) 1 x Per Week/30 Days Discharge Instructions: Cleanse the wound with Vashe prior to applying a clean dressing using gauze sponges, not tissue or cotton balls. Peri-Wound Care: Sween Lotion (Moisturizing lotion) 1 x Per Week/30 Days Discharge Instructions: Apply moisturizing lotion as directed Prim Dressing: Hydrofera Blue Ready Transfer Foam, 2.5x2.5 (in/in) 1 x Per Week/30 Days ary Discharge Instructions: Apply directly to wound bed as directed Prim Dressing: Santyl Ointment 1 x Per Week/30  Days ary Discharge Instructions: Apply nickel thick amount to wound bed as instructed Secondary Dressing: ABD Pad, 8x10 1 x Per Week/30 Days Discharge Instructions: Apply over primary dressing as directed. Secondary Dressing: Woven Gauze Sponge, Non-Sterile 4x4 in 1 x Per Week/30 Days Discharge Instructions: Apply over primary dressing as directed. Compression Wrap: Kerlix Roll 4.5x3.1 (in/yd) 1 x Per Week/30 Days Discharge Instructions: Apply Kerlix and Coban compression as directed. Compression Wrap: Coban Self-Adherent Wrap 4x5 (in/yd) 1 x Per Week/30 Days Discharge Instructions: Apply over Kerlix as directed. Electronic Signature(s) Signed: 07/23/2023 2:47:41 PM By: Geralyn Corwin DO Entered By: Geralyn Corwin on 07/23/2023 12:08:25 -------------------------------------------------------------------------------- Problem List Details Patient Name: Date of Service: TAWAN, ZACCHEO 07/23/2023 10:15 A M Medical Record Number: [id number] Patient Account Number: [id number] Date of Birth/Sex: Treating RN: 02-Mar-1931 (89 y.o. F) Primary Care Provider: [name] Other Clinician: Referring Provider: Treating Provider/Extender: Loetta Rough in Treatment: 3 Active Problems ICD-10 Encounter Code Description Active Date MDM Diagnosis L97.812 Non-pressure chronic ulcer of other part of right lower leg with fat layer 07/02/2023 No Yes exposed L97.822 Non-pressure chronic ulcer of other part of left lower leg with fat layer exposed7/30/2024 No Yes I89.0 Lymphedema, not elsewhere classified 07/02/2023 No Yes E11.622 Type 2 diabetes mellitus with other skin ulcer 07/02/2023 No Yes DENISS, FREUNDLICH ([id number]) [id number] Page 5 of 9 I50.42 Chronic combined systolic (congestive) and diastolic (congestive) heart failure 07/02/2023 No Yes Inactive Problems Resolved Problems Electronic Signature(s) Signed: 07/23/2023 2:47:41 PM  By: Geralyn Corwin DO Entered By: Geralyn Corwin on 07/23/2023 12:06:13 -------------------------------------------------------------------------------- Progress Note Details Patient Name: Date of Service: [name] 07/23/2023 10:15 A M Medical Record Number: [id number] Patient Account Number: [id number] Date of Birth/Sex: Treating RN: Jul 04, 1931 (89 y.o. F) Primary Care Provider: [name] Other Clinician: Referring Provider: Treating Provider/Extender: Loetta Rough in Treatment: 3 Subjective Chief Complaint Information obtained from Patient 07/02/2023; bilateral lower extremity wounds History of Present Illness (HPI) 07/02/2023 Ms. 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 She is not quite sure how the right upper leg wound started. The left lower extremity wound started out as blisters. She has compression stockings but has not been wearing them. She takes Lasix 20 mg daily. She currently denies signs of infection.  In office ABIs were 0.57 on the left however dorsalis pedal pulses and posterior tibialis pulses were heard on Doppler. 8/6; patient presents for follow-up. We have been using antibiotic ointment with Hydrofera Blue under Kerlix/Coban to the left lower extremity. Hydrofera Blue to the right lower leg. She came in for nurse visit and wrap change. She reports no issues. She reports that she is scheduled for her ABIs next week. 8/13; patient presents for follow-up. We have been using Hydrofera Blue and Santyl to the left lower extremity under Kerlix/Coban. She has been using Hydrofera Blue to the right upper leg wound. She had ABIs completed yesterday that showed an ABI of 0.91 on the left and  0.92 on the right. She has no issues or complaints today. 8/20; patient presents for follow-up. We have been using Hydrofera Blue and Santyl to the left lower extremity under 3 layer compression and Hydrofera Blue to the right upper leg wound. The right upper leg wound has healed. She feels that the wrap was too tight with the 3 layer. We will go back down to Kerlix/Coban. Other than that she has no issues or complaints. She denies signs of infection. Wounds Appear smaller. Patient History Medical History Hematologic/Lymphatic Patient has history of Anemia Cardiovascular Patient has history of Congestive Heart Failure, Hypertension, Peripheral Venous Disease Endocrine Patient has history of Type II Diabetes Musculoskeletal Patient has history of Osteoarthritis Neurologic Patient has history of Neuropathy Medical A Surgical History Notes nd Cardiovascular hyperlipidemia, venous stasis Gastrointestinal GERD Psychiatric generalized anxiety JIMIN, STINE ([id number]) [id number] Page 6 of 9 Objective Constitutional respirations regular, non-labored and within target range for patient.. Vitals Time Taken: 11:47 AM, Height: 62 in, Weight: 220 lbs, BMI: 40.2, Temperature: 97.7 F, Pulse: 69 bpm, Respiratory Rate: 18 breaths/min, Blood Pressure: 113/68 mmHg. Cardiovascular 2+ dorsalis pedis/posterior tibialis pulses. Psychiatric pleasant and cooperative. General Notes: Right lower extremity: T the inner medial upper leg where the knee bends there is Epithelization to the previous wound site. Left lower o extremity: T the anterior aspect there are 2 open wounds with fibrinous tissue/Slough and granulation tissue. Decent edema control. o Integumentary (Hair, Skin) Wound #10 status is Healed - Epithelialized. Original cause of wound was Blister. The date acquired was: 06/12/2023. The wound has been in treatment 3 weeks. The wound is located on the  Right,Medial Upper Leg. The wound measures 0cm length x 0cm width x 0cm depth; 0cm^2 area and 0cm^3 volume. There is no tunneling or undermining noted. There is a medium amount of serosanguineous drainage noted. The wound margin is distinct with the outline attached to the wound base. There is no granulation within the wound bed. There is no necrotic tissue within the wound bed. The periwound skin appearance exhibited: Scarring. The periwound skin appearance did not exhibit: Callus, Crepitus, Excoriation, Induration, Rash, Dry/Scaly, Maceration, Atrophie Blanche, Cyanosis, Ecchymosis, Hemosiderin Staining, Mottled, Pallor, Rubor, Erythema. Periwound temperature was noted as No Abnormality. Wound #11 status is Open. Original cause of wound was Bite. The date acquired was: 06/12/2023. The wound has been in treatment 3 weeks. The wound is located on the Left,Proximal Lower Leg. The wound measures 0.4cm length x 0.2cm width x 0.1cm depth; 0.063cm^2 area and 0.006cm^3 volume. There is Fat Layer (Subcutaneous Tissue) exposed. There is no tunneling or undermining noted. There is a medium amount of serosanguineous drainage noted. The wound margin is distinct with the outline attached to the wound base. There is large (67-100%) red granulation within the wound bed. There is  no necrotic tissue within the wound bed. The periwound skin appearance exhibited: Scarring, Hemosiderin Staining. The periwound skin appearance did not exhibit: Callus, Crepitus, Excoriation, Induration, Rash, Dry/Scaly, Maceration, Atrophie Blanche, Cyanosis, Ecchymosis, Mottled, Pallor, Rubor, Erythema. Periwound temperature was noted as No Abnormality. Wound #12 status is Open. Original cause of wound was Blister. The date acquired was: 06/12/2023. The wound has been in treatment 3 weeks. The wound is located on the [facility] Lower Leg. The wound measures 2.4cm length x 1.9cm width x 0.1cm depth; 3.581cm^2 area and 0.358cm^3  volume. There is no tunneling or undermining noted. There is a medium amount of serosanguineous drainage noted. The wound margin is distinct with the outline attached to the wound base. There is medium (34-66%) pink, pale granulation within the wound bed. There is a medium (34-66%) amount of necrotic tissue within the wound bed including Adherent Slough. The periwound skin appearance exhibited: Scarring, Hemosiderin Staining. The periwound skin appearance did not exhibit: Callus, Crepitus, Excoriation, Induration, Rash, Dry/Scaly, Maceration, Atrophie Blanche, Cyanosis, Ecchymosis, Mottled, Pallor, Rubor, Erythema. Periwound temperature was noted as No Abnormality. Assessment Active Problems ICD-10 Non-pressure chronic ulcer of other part of right lower leg with fat layer exposed Non-pressure chronic ulcer of other part of left lower leg with fat layer exposed Lymphedema, not elsewhere classified Type 2 diabetes mellitus with other skin ulcer Chronic combined systolic (congestive) and diastolic (congestive) heart failure Patient's wounds appear well-healing on the left lower extremity. I debrided nonviable tissue. I recommended continuing the course with Hydrofera Blue and Santyl however will go back down to Kerlix/Coban since patient reports discomfort with the 3 layer compression. The right lower extremity wound has healed. Follow-up in 1 week. Procedures Wound #12 Pre-procedure diagnosis of Wound #12 is a Diabetic Wound/Ulcer of the Lower Extremity located on the Left,Distal,Anterior Lower Leg .Severity of Tissue Pre Debridement is: Fat layer exposed. There was a Excisional Skin/Subcutaneous Tissue Debridement with a total area of 3.58 sq cm performed by Geralyn Corwin, DO. With the following instrument(s): Curette to remove Viable and Non-Viable tissue/material. Material removed includes Subcutaneous Tissue and Slough and after achieving pain control using Lidocaine 4% T opical Solution.  No specimens were taken. A time out was conducted at 12:02, prior to the start of the procedure. A Minimum amount of bleeding was controlled with Pressure. The procedure was tolerated well with a pain level of 0 throughout and a pain level of 0 following the procedure. Post Debridement Measurements: 2.4cm length x 1.9cm width x 0.1cm depth; 0.358cm^3 volume. Character of Wound/Ulcer Post Debridement is improved. Severity of Tissue Post Debridement is: Fat layer exposed. Post procedure Diagnosis Wound #12: Same as Pre-Procedure General Notes: Scribed for Dr Mikey Bussing by Brenton Grills RN. CHYENNE, EGY ([id number]) [id number] Page 7 of 9 Plan Follow-up Appointments: Return Appointment in 1 week. - Dr. Mikey Bussing Tuesday Room 8 Return Appointment in 2 weeks. Anesthetic: (In clinic) Topical Lidocaine 4% applied to wound bed Bathing/ Shower/ Hygiene: May shower with protection but do not get wound dressing(s) wet. Protect dressing(s) with water repellant cover (for example, large plastic bag) or a cast cover and may then take shower. Edema Control - Lymphedema / SCD / Other: Elevate legs to the level of the heart or above for 30 minutes daily and/or when sitting for 3-4 times a day throughout the day. Avoid standing for long periods of time. Additional Orders / Instructions: Follow Nutritious Diet - increase protein Juven Shake 1-2 times daily. WOUND #11: - Lower Leg Wound Laterality:  Left, Proximal Cleanser: Soap and Water 1 x Per Week/30 Days Discharge Instructions: May shower and wash wound with dial antibacterial soap and water prior to dressing change. Cleanser: Vashe 5.8 (oz) 1 x Per Week/30 Days Discharge Instructions: Cleanse the wound with Vashe prior to applying a clean dressing using gauze sponges, not tissue or cotton balls. Peri-Wound Care: Sween Lotion (Moisturizing lotion) 1 x Per Week/30 Days Discharge Instructions: Apply moisturizing lotion as  directed Prim Dressing: Hydrofera Blue Ready Transfer Foam, 2.5x2.5 (in/in) 1 x Per Week/30 Days ary Discharge Instructions: Apply directly to wound bed as directed Prim Dressing: Santyl Ointment 1 x Per Week/30 Days ary Discharge Instructions: Apply nickel thick amount to wound bed as instructed Secondary Dressing: ABD Pad, 8x10 1 x Per Week/30 Days Discharge Instructions: Apply over primary dressing as directed. Secondary Dressing: Woven Gauze Sponge, Non-Sterile 4x4 in 1 x Per Week/30 Days Discharge Instructions: Apply over primary dressing as directed. Com pression Wrap: Kerlix Roll 4.5x3.1 (in/yd) 1 x Per Week/30 Days Discharge Instructions: Apply Kerlix and Coban compression as directed. Com pression Wrap: Coban Self-Adherent Wrap 4x5 (in/yd) 1 x Per Week/30 Days Discharge Instructions: Apply over Kerlix as directed. WOUND #12: - Lower Leg Wound Laterality: Left, Anterior, Distal Cleanser: Soap and Water 1 x Per Week/30 Days Discharge Instructions: May shower and wash wound with dial antibacterial soap and water prior to dressing change. Cleanser: Vashe 5.8 (oz) 1 x Per Week/30 Days Discharge Instructions: Cleanse the wound with Vashe prior to applying a clean dressing using gauze sponges, not tissue or cotton balls. Peri-Wound Care: Sween Lotion (Moisturizing lotion) 1 x Per Week/30 Days Discharge Instructions: Apply moisturizing lotion as directed Prim Dressing: Hydrofera Blue Ready Transfer Foam, 2.5x2.5 (in/in) 1 x Per Week/30 Days ary Discharge Instructions: Apply directly to wound bed as directed Prim Dressing: Santyl Ointment 1 x Per Week/30 Days ary Discharge Instructions: Apply nickel thick amount to wound bed as instructed Secondary Dressing: ABD Pad, 8x10 1 x Per Week/30 Days Discharge Instructions: Apply over primary dressing as directed. Secondary Dressing: Woven Gauze Sponge, Non-Sterile 4x4 in 1 x Per Week/30 Days Discharge Instructions: Apply over primary dressing  as directed. Com pression Wrap: Kerlix Roll 4.5x3.1 (in/yd) 1 x Per Week/30 Days Discharge Instructions: Apply Kerlix and Coban compression as directed. Com pression Wrap: Coban Self-Adherent Wrap 4x5 (in/yd) 1 x Per Week/30 Days Discharge Instructions: Apply over Kerlix as directed. 1. In office sharp debridement 2. Santyl and Hydrofera Blue under Kerlix/Cobanleft lower extremity 3. Follow-up in 1 week Electronic Signature(s) Signed: 07/23/2023 2:47:41 PM By: Geralyn Corwin DO Entered By: Geralyn Corwin on 07/23/2023 12:09:39 -------------------------------------------------------------------------------- HxROS Details Patient Name: Date of Service: [name] 07/23/2023 10:15 A M Medical Record Number: [id number] Patient Account Number: [id number] Date of Birth/Sex: Treating RN: [birth year] (89 y.o. F) Primary Care Provider: [name] Other Clinician: Referring Provider: Treating Provider/Extender: Loetta Rough in Treatment: 3 CAROYL, BYLER ([id number]) [id number] Page 8 of 9 Hematologic/Lymphatic Medical History: Positive for: Anemia Cardiovascular Medical History: Positive for: Congestive Heart Failure; Hypertension; Peripheral Venous Disease Past Medical History Notes: hyperlipidemia, venous stasis Gastrointestinal Medical History: Past Medical History Notes: GERD Endocrine Medical History: Positive for: Type II Diabetes Musculoskeletal Medical History: Positive for: Osteoarthritis Neurologic Medical History: Positive for: Neuropathy Psychiatric Medical History: Past Medical History Notes: generalized anxiety Immunizations Pneumococcal Vaccine: Received Pneumococcal Vaccination: Yes Received Pneumococcal Vaccination On or After 60th Birthday: Yes Implantable Devices No devices added Electronic Signature(s) Signed: 07/23/2023 2:47:41 PM By:  Geralyn Corwin DO Entered By:  Geralyn Corwin on 07/23/2023 12:07:31 -------------------------------------------------------------------------------- SuperBill Details Patient Name: Date of Service: KADIJ[place], NEIGHBOURS 07/23/2023 Medical Record Number: [id number] Patient Account Number: [id number] Date of Birth/Sex: Treating RN: 08-05-1931 (89 y.o. 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 I89.0 Lymphedema, not elsewhere classified E11.622 Type 2 diabetes mellitus with other skin ulcer I50.42 Chronic combined systolic (congestive) and diastolic (congestive) heart failure Facility Procedures : CPT4 Code: [id number] Description: 11042 - DEB SUBQ TISSUE 20 SQ CM/< ICD-10 Diagnosis Description L97.822 Non-pressure chronic ulcer of other part of left lower leg with fat layer expo E11.622 Type 2 diabetes mellitus with other skin ulcer Modifier: sed Quantity: 1 Physician Procedures : CPT4 Code Description Modifier [id number] 11042 - WC PHYS SUBQ TISS 20 SQ CM ICD-10 Diagnosis Description L97.822 Non-pressure chronic ulcer of other part of left lower leg with fat layer exposed E11.622 Type 2 diabetes mellitus with other skin ulcer Quantity: 1 Electronic Signature(s) Signed: 07/23/2023 2:47:41 PM By: Geralyn Corwin DO Entered By: Geralyn Corwin on 07/23/2023 12:10:07

## 2023-07-30 NOTE — Progress Notes (Signed)
CELA, MCGILBERRY ([id number]) [id number] Page 1 of 11 Visit Report for 07/30/2023 Arrival Information Details Patient Name: Date of Service: [name], [name] 07/30/2023 11:15 A M Medical Record Number: [id number] Patient Account Number: [id number] Date of Birth/Sex: Treating RN: 07/02/1931 (89 y.o. F) Primary Care [place] Silveria: [name] Other Clinician: Referring Izack Hoogland: Treating Lizzete Gough/Extender: Loetta Rough in Treatment: 4 Visit Information History Since Last Visit Added or deleted any medications: No Patient Arrived: [name] Any new allergies or adverse reactions: No Arrival Time: 11:24 Had a fall or experienced change in No Accompanied By: friend activities of daily living that may affect Transfer Assistance: None risk of falls: Patient Identification Verified: Yes Signs or symptoms of abuse/neglect since last visito No Secondary Verification Process Completed: Yes Hospitalized since last visit: No Patient Requires Transmission-Based Precautions: No Implantable device outside of the clinic excluding No Patient Has Alerts: Yes cellular tissue based products placed in the center Patient Alerts: ABIs: R:0.92 L:0.91 8/24 since last visit: Has Dressing in Place as Prescribed: Yes Has Compression in Place as Prescribed: Yes Pain Present Now: No Electronic Signature(s) Signed: 07/30/2023 4:24:20 PM By: Thayer Dallas Entered By: Thayer Dallas on 07/30/2023 11:29:43 -------------------------------------------------------------------------------- Clinic Level of Care Assessment Details Patient Name: Date of Service: TABASSUM, SCARFF 07/30/2023 11:15 A M Medical Record Number: [id number] Patient Account Number: [id number] Date of Birth/Sex: Treating RN: 1931/01/11 (89 y.o. [name] Primary Care Malijah Lietz: [name] Other Clinician: Referring Jehad Bisono: Treating Edan Juday/Extender: Loetta Rough in Treatment: 4 Clinic Level of Care Assessment Items TOOL 4 Quantity Score X- 1 0 Use when only an EandM is performed on FOLLOW-UP visit ASSESSMENTS - Nursing Assessment / Reassessment X- 1 10 Reassessment of Co-morbidities (includes updates in patient status) X- 1 5 Reassessment of Adherence to Treatment Plan ASSESSMENTS - Wound and Skin A ssessment / Reassessment []  - 0 Simple Wound Assessment / Reassessment - one wound X- 2 5 Complex Wound Assessment / Reassessment - multiple wounds []  - 0 Dermatologic / Skin Assessment (not related to wound area) ASSESSMENTS - Focused Assessment X- 1 5 Circumferential Edema Measurements - multi extremities []  - 0 Nutritional Assessment / Counseling / Intervention [name], [name] ([id number]) [id number] Page 2 of 11 []  - 0 Lower Extremity Assessment (monofilament, tuning fork, pulses) []  - 0 Peripheral Arterial Disease Assessment (using hand held doppler) ASSESSMENTS - Ostomy and/or Continence Assessment and Care []  - 0 Incontinence Assessment and Management []  - 0 Ostomy Care Assessment and Management (repouching, etc.) PROCESS - Coordination of Care X - Simple Patient / Family Education for ongoing care 1 15 []  - 0 Complex (extensive) Patient / Family Education for ongoing care X- 1 10 Staff obtains Chiropractor, Records, T Results / Process Orders est []  - 0 Staff telephones HHA, Nursing Homes / Clarify orders / etc []  - 0 Routine Transfer to another Facility (non-emergent condition) []  - 0 Routine Hospital Admission (non-emergent condition) []  - 0 New Admissions / Manufacturing engineer / Ordering NPWT Apligraf, etc. , []  - 0 Emergency Hospital Admission (emergent condition) []  - 0 Simple Discharge Coordination []  - 0 Complex (extensive) Discharge Coordination PROCESS - Special Needs []  - 0 Pediatric / Minor Patient Management []  - 0 Isolation Patient  Management []  - 0 Hearing / Language / Visual special needs []  - 0 Assessment of Community assistance (transportation, D/C planning, etc.) []  - 0 Additional assistance / Altered mentation []  - 0 Support Surface(s) Assessment (bed, cushion, seat,  etc.) INTERVENTIONS - Wound Cleansing / Measurement []  - 0 Simple Wound Cleansing - one wound X- 2 5 Complex Wound Cleansing - multiple wounds X- 1 5 Wound Imaging (photographs - any number of wounds) []  - 0 Wound Tracing (instead of photographs) []  - 0 Simple Wound Measurement - one wound X- 2 5 Complex Wound Measurement - multiple wounds INTERVENTIONS - Wound Dressings []  - 0 Small Wound Dressing one or multiple wounds X- 1 15 Medium Wound Dressing one or multiple wounds []  - 0 Large Wound Dressing one or multiple wounds X- 1 5 Application of Medications - topical []  - 0 Application of Medications - injection INTERVENTIONS - Miscellaneous []  - 0 External ear exam []  - 0 Specimen Collection (cultures, biopsies, blood, body fluids, etc.) []  - 0 Specimen(s) / Culture(s) sent or taken to Lab for analysis []  - 0 Patient Transfer (multiple staff / Nurse, adult / Similar devices) []  - 0 Simple Staple / Suture removal (25 or less) []  - 0 Complex Staple / Suture removal (26 or more) []  - 0 Hypo / Hyperglycemic Management (close monitor of Blood Glucose) [name], [name] ([id number]) [id number] Page 3 of 11 []  - 0 Ankle / Brachial Index (ABI) - do not check if billed separately X- 1 5 Vital Signs Has the patient been seen at the hospital within the last three years: Yes Total Score: 105 Level Of Care: New/Established - Level 3 Electronic Signature(s) Signed: 07/30/2023 4:35:22 PM By: Redmond Pulling RN, BSN Entered By: Redmond Pulling on 07/30/2023 12:09:10 -------------------------------------------------------------------------------- Encounter Discharge Information Details Patient Name: Date of  Service: [name] 07/30/2023 11:15 A M Medical Record Number: [id number] Patient Account Number: [id number] Date of Birth/Sex: Treating RN: 1931/07/31 (89 y.o. [name] Primary Care Ossie Beltran: [name] Other Clinician: Referring Willo Yoon: Treating Chay Mazzoni/Extender: Loetta Rough in Treatment: 4 Encounter Discharge Information Items Discharge Condition: Stable Ambulatory Status: Walker Discharge Destination: Home Transportation: Private Auto Accompanied By: friend Schedule Follow-up Appointment: Yes Clinical Summary of Care: Patient Declined Electronic Signature(s) Signed: 07/30/2023 4:35:22 PM By: Redmond Pulling RN, BSN Entered By: Redmond Pulling on 07/30/2023 16:24:48 -------------------------------------------------------------------------------- Lower Extremity Assessment Details Patient Name: Date of Service: ZUHUR, BORGES 07/30/2023 11:15 A M Medical Record Number: [id number] Patient Account Number: [id number] Date of Birth/Sex: Treating RN: 1931-09-29 (89 y.o. F) Primary Care Patrece Tallie: [name] Other Clinician: Referring Charlena Haub: Treating Daeja Helderman/Extender: [name]: 4 Edema Assessment Assessed: [Left: No] [Right: No] Edema: [Left: Ye] [Right: s] Calf Left: Right: Point of Measurement: 32 cm From Medial Instep 45 cm Ankle Left: Right: Point of Measurement: 11 cm From Medial Instep 23 cm Vascular Assessment Left: [id number] Page 4 of 11Right:] Extremity colors, hair growth, and conditions: Extremity Color: [phone].pdf Page 4 of 11Normal] Hair Growth on Extremity: [phone].pdf Page 4 of 11Yes] Temperature of Extremity: [phone].pdf Page 4 of 11Hot] Capillary Refill: [id number] Page 4 of 11< 3 seconds] Dependent Rubor:  [phone].pdf Page 4 of 11No No] Electronic Signature(s) Signed: 07/30/2023 4:24:20 PM By: Thayer Dallas Entered By: Thayer Dallas on 07/30/2023 11:32:05 -------------------------------------------------------------------------------- Multi Wound Chart Details Patient Name: Date of Service: [name] 07/30/2023 11:15 A M Medical Record Number: [id number] Patient Account Number: [id number] Date of Birth/Sex: Treating RN: [birth year] (89 y.o. F) Primary Care Tabius Rood: [name] Other Clinician: Referring Vadim Centola: Treating Tangee Marszalek/Extender: Loetta Rough in Treatment: 4 Vital Signs Height(in): 62 Pulse(bpm): 61 Weight(lbs): 220 Blood Pressure(mmHg): 126/60 Body  Mass Index(BMI): 40.2 Temperature(F): Respiratory Rate(breaths/min): 18 [11:Photos:] [12:No Photos] [N/A:N/A] Left, Proximal Lower Leg Left, Distal, Anterior Lower Leg N/A Wound Location: Bite Blister N/A Wounding Event: Diabetic Wound/Ulcer of the Lower Diabetic Wound/Ulcer of the Lower N/A Primary Etiology: Extremity Extremity Lymphedema Lymphedema N/A Secondary Etiology: Anemia, Congestive Heart Failure, Anemia, Congestive Heart Failure, N/A Comorbid History: Hypertension, Peripheral Venous Hypertension, Peripheral Venous Disease, Type II Diabetes, Disease, Type II Diabetes, Osteoarthritis, Neuropathy Osteoarthritis, Neuropathy 06/12/2023 06/12/2023 N/A Date Acquired: 4 4 N/A Weeks of Treatment: Open Open N/A Wound Status: No No N/A Wound Recurrence: Yes No N/A Clustered Wound: 1 N/A N/A Clustered Quantity: 0.1x0.1x0.1 2.1x1x0.1 N/A Measurements L x W x D (cm) 0.008 1.649 N/A A (cm) : rea 0.001 0.165 N/A Volume (cm) : 99.70% 56.50% N/A % Reduction in A rea: 99.60% 56.50% N/A % Reduction in Volume: Grade 1 Grade 1 N/A Classification: Medium Medium N/A Exudate A mount: Serosanguineous Serosanguineous N/A Exudate Type: red,  brown red, brown N/A Exudate Color: Distinct, outline attached Distinct, outline attached N/A Wound Margin: Large (67-100%) Medium (34-66%) N/A Granulation A mount: Red Pink, Pale N/A Granulation Quality: None Present (0%) Medium (34-66%) N/A Necrotic A mount: Fat Layer (Subcutaneous Tissue): Yes Fascia: No N/A Exposed StructuresJANAEYA, [name] ([id number]) [id number] Page 5 of 11 Fascia: No Fat Layer (Subcutaneous Tissue): No Tendon: No Tendon: No Muscle: No Muscle: No Joint: No Joint: No Bone: No Bone: No Small (1-33%) Small (1-33%) N/A Epithelialization: Scarring: Yes Scarring: Yes N/A Periwound Skin Texture: Excoriation: No Excoriation: No Induration: No Induration: No Callus: No Callus: No Crepitus: No Crepitus: No Rash: No Rash: No Maceration: No Maceration: No N/A Periwound Skin Moisture: Dry/Scaly: No Dry/Scaly: No Hemosiderin Staining: Yes Hemosiderin Staining: Yes N/A Periwound Skin Color: Atrophie Blanche: No Atrophie Blanche: No Cyanosis: No Cyanosis: No Ecchymosis: No Ecchymosis: No Erythema: No Erythema: No Mottled: No Mottled: No Pallor: No Pallor: No Rubor: No Rubor: No No Abnormality No Abnormality N/A Temperature: Treatment Notes Electronic Signature(s) Signed: 07/30/2023 12:58:11 PM By: Geralyn Corwin DO Entered By: Geralyn Corwin on 07/30/2023 12:28:27 -------------------------------------------------------------------------------- Multi-Disciplinary Care Plan Details Patient Name: Date of Service: [name] 07/30/2023 11:15 A M Medical Record Number: [id number] Patient Account Number: [id number] Date of Birth/Sex: Treating RN: [birth year] (89 y.o. [name] Primary Care Izabel Chim: [name] Other Clinician: Referring Bernis Stecher: Treating Zarriah Starkel/Extender: Loetta Rough in Treatment: 4 Active Inactive Nutrition Nursing  Diagnoses: Potential for alteratiion in Nutrition/Potential for imbalanced nutrition Goals: Patient/caregiver agrees to and verbalizes understanding of need to obtain nutritional consultation Date Initiated: 07/02/2023 Date Inactivated: 07/30/2023 Target Resolution Date: 08/02/2023 Goal Status: Met Patient/caregiver will maintain therapeutic glucose control Date Initiated: 07/02/2023 Target Resolution Date: 08/02/2023 Goal Status: Active Interventions: Assess HgA1c results as ordered upon admission and as needed Provide education on elevated blood sugars and impact on wound healing Provide education on nutrition Treatment Activities: Patient referred to Primary Care Physician for further nutritional evaluation : 07/02/2023 Notes: Pain, Acute or Chronic Nursing Diagnoses: Pain, acute or chronic: actual or potential [name], [name] ([id number]) [phone].pdf Page 6 of 11 Potential alteration in comfort, pain Goals: Patient will verbalize adequate pain control and receive pain control interventions during procedures as needed Date Initiated: 07/02/2023 Date Inactivated: 07/30/2023 Target Resolution Date: 08/02/2023 Goal Status: Met Patient/caregiver will verbalize comfort level met Date Initiated: 07/02/2023 Target Resolution Date: 08/02/2023 Goal Status: Active Interventions: Encourage patient to take pain medications as prescribed Provide education on pain management Reposition patient for comfort  Treatment Activities: Administer pain control measures as ordered : 07/02/2023 Notes: Wound/Skin Impairment Nursing Diagnoses: Knowledge deficit related to ulceration/compromised skin integrity Goals: Patient/caregiver will verbalize understanding of skin care regimen Date Initiated: 07/02/2023 Target Resolution Date: 08/02/2023 Goal Status: Active Interventions: Assess patient/caregiver ability to perform ulcer/skin care regimen upon admission and as  needed Assess ulceration(s) every visit Provide education on ulcer and skin care Treatment Activities: Skin care regimen initiated : 07/02/2023 Topical wound management initiated : 07/02/2023 Notes: Electronic Signature(s) Signed: 07/30/2023 4:35:22 PM By: Redmond Pulling RN, BSN Entered By: Redmond Pulling on 07/30/2023 11:42:56 -------------------------------------------------------------------------------- Pain Assessment Details Patient Name: Date of Service: [name] 07/30/2023 11:15 A M Medical Record Number: [id number] Patient Account Number: [id number] Date of Birth/Sex: Treating RN: 02/22/1931 (89 y.o. F) Primary Care Aldo Sondgeroth: [name] Other Clinician: Referring Atha Muradyan: Treating Michaeal Davis/Extender: Loetta Rough in Treatment: 4 Active Problems Location of Pain Severity and Description of Pain Patient Has Paino No Site Locations [place], [place] [name] ([id number]) [id number] Page 7 of 11 Pain Management and Medication Current Pain Management: Electronic Signature(s) Signed: 07/30/2023 4:24:20 PM By: Thayer Dallas Entered By: Thayer Dallas on 07/30/2023 11:31:27 -------------------------------------------------------------------------------- Patient/Caregiver Education Details Patient Name: Date of Service: [name] 8/27/2024andnbsp11:15 A M Medical Record Number: [id number] Patient Account Number: [id number] Date of Birth/Gender: Treating RN: Aug 03, 1931 (89 y.o. [name] Primary Care Physician: [name] Other Clinician: Referring Physician: Treating Physician/Extender: Loetta Rough in Treatment: 4 Education Assessment Education Provided To: Patient Education Topics Provided Wound/Skin Impairment: Methods: Explain/Verbal Responses: State content correctly Electronic Signature(s) Signed: 07/30/2023 4:35:22 PM By: Redmond Pulling RN,  BSN Entered By: Redmond Pulling on 07/30/2023 11:57:06 -------------------------------------------------------------------------------- Wound Assessment Details Patient Name: Date of Service: [name], [name] 07/30/2023 11:15 A M Medical Record Number: [id number] Patient Account Number: [id number] Date of Birth/Sex: Treating RN: [birth year] (89 y.o. F) Primary Care Azhia Siefken: [name] Other Clinician: Referring Ellyanna Holton: Treating Chemeka Filice/Extender: [name] [place], [place] ([id number]) [id number] Page 8 of 11 Weeks in Treatment: 4 Wound Status Wound Number: 11 Primary Diabetic Wound/Ulcer of the Lower Extremity Etiology: Wound Location: Left, Proximal Lower Leg Secondary Lymphedema Wounding Event: Bite Etiology: Date Acquired: 06/12/2023 Wound Open Weeks Of Treatment: 4 Status: Clustered Wound: Yes Comorbid Anemia, Congestive Heart Failure, Hypertension, Peripheral History: Venous Disease, Type II Diabetes, Osteoarthritis, Neuropathy Photos Wound Measurements Length: (cm) 0.1 Width: (cm) 0.1 Depth: (cm) 0.1 Clustered Quantity: 1 Area: (cm) 0.008 Volume: (cm) 0.001 % Reduction in Area: 99.7% % Reduction in Volume: 99.6% Epithelialization: Small (1-33%) Wound Description Classification: Grade 1 Wound Margin: Distinct, outline attached Exudate Amount: Medium Exudate Type: Serosanguineous Exudate Color: red, brown Foul Odor After Cleansing: No Slough/Fibrino Yes Wound Bed Granulation Amount: Large (67-100%) Exposed Structure Granulation Quality: Red Fascia Exposed: No Necrotic Amount: None Present (0%) Fat Layer (Subcutaneous Tissue) Exposed: Yes Tendon Exposed: No Muscle Exposed: No Joint Exposed: No Bone Exposed: No Periwound Skin Texture Texture Color No Abnormalities Noted: No No Abnormalities Noted: No Callus: No Atrophie Blanche: No Crepitus: No Cyanosis: No Excoriation: No Ecchymosis:  No Induration: No Erythema: No Rash: No Hemosiderin Staining: Yes Scarring: Yes Mottled: No Pallor: No Moisture Rubor: No No Abnormalities Noted: No Dry / Scaly: No Temperature / Pain Maceration: No Temperature: No Abnormality Treatment Notes Wound #11 (Lower Leg) Wound Laterality: Left, Proximal Cleanser Soap and Water Discharge Instruction: May shower and wash wound with dial antibacterial soap and water prior to dressing change. Vashe  5.8 (oz) Discharge Instruction: Cleanse the wound with Vashe prior to applying a clean dressing using gauze sponges, not tissue or cotton balls. ENEDELIA, VARELA ([id number]) [id number] Page 9 of 11 Peri-Wound Care Sween Lotion (Moisturizing lotion) Discharge Instruction: Apply moisturizing lotion as directed Topical Primary Dressing Hydrofera Blue Ready Transfer Foam, 2.5x2.5 (in/in) Discharge Instruction: Apply directly to wound bed as directed Santyl Ointment Discharge Instruction: Apply nickel thick amount to wound bed as instructed Secondary Dressing ABD Pad, 8x10 Discharge Instruction: Apply over primary dressing as directed. Woven Gauze Sponge, Non-Sterile 4x4 in Discharge Instruction: Apply over primary dressing as directed. Secured With Compression Wrap Kerlix Roll 4.5x3.1 (in/yd) Discharge Instruction: Apply Kerlix and Coban compression as directed. Coban Self-Adherent Wrap 4x5 (in/yd) Discharge Instruction: Apply over Kerlix as directed. Compression Stockings Add-Ons Electronic Signature(s) Signed: 07/30/2023 4:24:20 PM By: Thayer Dallas Entered By: Thayer Dallas on 07/30/2023 11:39:54 -------------------------------------------------------------------------------- Wound Assessment Details Patient Name: Date of Service: [name], [name] 07/30/2023 11:15 A M Medical Record Number: [id number] Patient Account Number: [id number] Date of Birth/Sex: Treating RN: 1931-05-17 (89 y.o. F) Primary Care  Nariyah Osias: [name] Other Clinician: Referring Keirsten Matuska: Treating Lovell Nuttall/Extender: [name]: 4 Wound Status Wound Number: 12 Primary Diabetic Wound/Ulcer of the Lower Extremity Etiology: Wound Location: Left, Distal, Anterior Lower Leg Secondary Lymphedema Wounding Event: Blister Etiology: Date Acquired: 06/12/2023 Wound Open Weeks Of Treatment: 4 Status: Clustered Wound: No Comorbid Anemia, Congestive Heart Failure, Hypertension, Peripheral History: Venous Disease, Type II Diabetes, Osteoarthritis, Neuropathy Wound Measurements Length: (cm) 2.1 Width: (cm) 1 Depth: (cm) 0.1 Area: (cm) 1.649 Volume: (cm) 0.165 % Reduction in Area: 56.5% % Reduction in Volume: 56.5% Epithelialization: Small (1-33%) Wound Description Classification: Grade 1 Wound Margin: Distinct, outline attached Exudate Amount: Medium Exudate Type: Serosanguineous Exudate Color: red, brown [name], [name] ([id number]) Foul Odor After Cleansing: No Slough/Fibrino Yes [id number] Page 10 of 11 Wound Bed Granulation Amount: Medium (34-66%) Exposed Structure Granulation Quality: Pink, Pale Fascia Exposed: No Necrotic Amount: Medium (34-66%) Fat Layer (Subcutaneous Tissue) Exposed: No Necrotic Quality: Adherent Slough Tendon Exposed: No Muscle Exposed: No Joint Exposed: No Bone Exposed: No Periwound Skin Texture Texture Color No Abnormalities Noted: No No Abnormalities Noted: No Callus: No Atrophie Blanche: No Crepitus: No Cyanosis: No Excoriation: No Ecchymosis: No Induration: No Erythema: No Rash: No Hemosiderin Staining: Yes Scarring: Yes Mottled: No Pallor: No Moisture Rubor: No No Abnormalities Noted: No Dry / Scaly: No Temperature / Pain Maceration: No Temperature: No Abnormality Treatment Notes Wound #12 (Lower Leg) Wound Laterality: Left, Anterior, Distal Cleanser Soap and Water Discharge  Instruction: May shower and wash wound with dial antibacterial soap and water prior to dressing change. Vashe 5.8 (oz) Discharge Instruction: Cleanse the wound with Vashe prior to applying a clean dressing using gauze sponges, not tissue or cotton balls. Peri-Wound Care Sween Lotion (Moisturizing lotion) Discharge Instruction: Apply moisturizing lotion as directed Topical Primary Dressing Hydrofera Blue Ready Transfer Foam, 2.5x2.5 (in/in) Discharge Instruction: Apply directly to wound bed as directed Santyl Ointment Discharge Instruction: Apply nickel thick amount to wound bed as instructed Secondary Dressing ABD Pad, 8x10 Discharge Instruction: Apply over primary dressing as directed. Woven Gauze Sponge, Non-Sterile 4x4 in Discharge Instruction: Apply over primary dressing as directed. Secured With Compression Wrap Kerlix Roll 4.5x3.1 (in/yd) Discharge Instruction: Apply Kerlix and Coban compression as directed. Coban Self-Adherent Wrap 4x5 (in/yd) Discharge Instruction: Apply over Kerlix as directed. Compression Stockings Add-Ons Electronic Signature(s) Signed: 07/30/2023 4:24:20 PM By: Thayer Dallas Entered By:  Thayer Dallas on 07/30/2023 11:45:20 [name], [name] ([id number]) [id number] Page 11 of 11 -------------------------------------------------------------------------------- Vitals Details Patient Name: Date of Service: [name], [name] 07/30/2023 11:15 A M Medical Record Number: [id number] Patient Account Number: [id number] Date of Birth/Sex: Treating RN: 1931/05/15 (89 y.o. F) Primary Care Jacquette Canales: [name] Other Clinician: Referring Clayborne Divis: Treating Deakon Frix/Extender: Loetta Rough in Treatment: 4 Vital Signs Time Taken: 11:30 Pulse (bpm): 61 Height (in): 62 Respiratory Rate (breaths/min): 18 Weight (lbs): 220 Blood Pressure (mmHg): 126/60 Body Mass Index (BMI): 40.2 Reference Range: 80  - 120 mg / dl Electronic Signature(s) Signed: 07/30/2023 4:24:20 PM By: Thayer Dallas Entered By: Thayer Dallas on 07/30/2023 11:30:45

## 2023-07-30 NOTE — Progress Notes (Signed)
[name], [name] ([id number]) [id number] Page 1 of 10 Visit Report for 07/23/2023 Arrival Information Details Patient Name: Date of Service: [name], [name] 07/23/2023 10:15 A M Medical Record Number: [id number] Patient Account Number: [id number] Date of Birth/Sex: Treating RN: 09-26-1931 (89 y.o. F) Primary Care Ulyses Panico: [name] Other Clinician: Referring Siarra Gilkerson: Treating Celeste Candelas/Extender: Loetta Rough in Treatment: 3 Visit Information History Since Last Visit Added or deleted any medications: No Patient Arrived: [name] Any new allergies or adverse reactions: No Arrival Time: 11:31 Had a fall or experienced change in No Accompanied By: friend activities of daily living that may affect Transfer Assistance: None risk of falls: Patient Identification Verified: Yes Signs or symptoms of abuse/neglect since last visito No Secondary Verification Process Completed: Yes Hospitalized since last visit: No Patient Requires Transmission-Based Precautions: No Implantable device outside of the clinic excluding No Patient Has Alerts: Yes cellular tissue based products placed in the center Patient Alerts: ABIs: R:0.92 L:0.91 8/24 since last visit: Has Dressing in Place as Prescribed: Yes Has Compression in Place as Prescribed: Yes Pain Present Now: No Electronic Signature(s) Signed: 07/25/2023 5:02:38 PM By: Thayer Dallas Entered By: Thayer Dallas on 07/23/2023 11:32:16 -------------------------------------------------------------------------------- Encounter Discharge Information Details Patient Name: Date of Service: [name] 07/23/2023 10:15 A M Medical Record Number: [id number] Patient Account Number: [id number] Date of Birth/Sex: Treating RN: 1931/01/16 (89 y.o. [name] Primary Care Nickalaus Crooke: [name] Other Clinician: Referring Brylin Stanislawski: Treating Aoife Bold/Extender: Loetta Rough in Treatment: 3 Encounter Discharge Information Items Post Procedure Vitals Discharge Condition: Stable Temperature (F): 97.7 Ambulatory Status: Other Pulse (bpm): 68 Discharge Destination: Home Respiratory Rate (breaths/min): 18 Transportation: Private Auto Blood Pressure (mmHg): 136/76 Accompanied By: daughter Schedule Follow-up Appointment: Yes Clinical Summary of Care: Patient Declined Electronic Signature(s) Signed: 07/30/2023 2:03:55 PM By: Brenton Grills Entered By: Brenton Grills on 07/23/2023 12:10:18 [name] ([id number]) [id number] Page 2 of 10 -------------------------------------------------------------------------------- Lower Extremity Assessment Details Patient Name: Date of Service: [name], [name] 07/23/2023 10:15 A M Medical Record Number: [id number] Patient Account Number: [id number] Date of Birth/Sex: Treating RN: July 13, 1931 (89 y.o. F) Primary Care Atlee Kluth: [name] Other Clinician: Referring Kathya Wilz: Treating Rena Hunke/Extender: Loetta Rough in Treatment: 3 Edema Assessment Assessed: [Left: No] [Right: No] Edema: [Left: Ye] [Right: s] Calf Left: Right: Point of Measurement: 32 cm From Medial Instep 46.5 cm 43.5 cm Ankle Left: Right: Point of Measurement: 11 cm From Medial Instep 20 cm 22.1 cm Vascular Assessment Extremity colors, hair growth, and conditions: Extremity Color: [Left:Normal] Hair Growth on Extremity: [Left:Yes] Temperature of Extremity: [Left:Hot] Capillary Refill: [Left:< 3 seconds] Dependent Rubor: [Left:No No] Electronic Signature(s) Signed: 07/25/2023 5:02:38 PM By: Thayer Dallas Entered By: Thayer Dallas on 07/23/2023 11:33:56 -------------------------------------------------------------------------------- Multi Wound Chart Details Patient Name: Date of Service: [name] 07/23/2023 10:15 A M Medical  Record Number: [id number] Patient Account Number: [id number] Date of Birth/Sex: Treating RN: 01-25-1931 (89 y.o. F) Primary Care Tashaya Ancrum: [name] Other Clinician: Referring Beverley Allender: Treating Trenae Brunke/Extender: Loetta Rough in Treatment: 3 Vital Signs Height(in): 62 Pulse(bpm): 69 Weight(lbs): 220 Blood Pressure(mmHg): 113/68 Body Mass Index(BMI): 40.2 Temperature(F): 97.7 Respiratory Rate(breaths/min): 18 [10:Photos: No Photos] [[id number] Page 3 of 10] Right, Medial Upper Leg Left, Proximal Lower Leg Left, Distal, Anterior Lower Leg Wound Location: Blister Bite Blister Wounding Event: Diabetic Wound/Ulcer of the Lower Diabetic Wound/Ulcer of the Lower Diabetic Wound/Ulcer of the Lower Primary Etiology: Extremity Extremity  Extremity Lymphedema Lymphedema Lymphedema Secondary Etiology: Anemia, Congestive Heart Failure, Anemia, Congestive Heart Failure, Anemia, Congestive Heart Failure, Comorbid History: Hypertension, Peripheral Venous Hypertension, Peripheral Venous Hypertension, Peripheral Venous Disease, Type II Diabetes, Disease, Type II Diabetes, Disease, Type II Diabetes, Osteoarthritis, Neuropathy Osteoarthritis, Neuropathy Osteoarthritis, Neuropathy 06/12/2023 06/12/2023 06/12/2023 Date Acquired: 3 3 3  Weeks of Treatment: Healed - Epithelialized Open Open Wound Status: No No No Wound Recurrence: No Yes No Clustered Wound: N/A 1 N/A Clustered Quantity: Yes No No Pending A mputation on Presentation: 0x0x0 0.4x0.2x0.1 2.4x1.9x0.1 Measurements L x W x D (cm) 0 0.063 3.581 A (cm) : rea 0 0.006 0.358 Volume (cm) : 100.00% 97.60% 5.60% % Reduction in A rea: 100.00% 97.80% 5.50% % Reduction in Volume: Grade 1 Grade 1 Grade 1 Classification: Medium Medium Medium Exudate A mount: Serosanguineous Serosanguineous Serosanguineous Exudate Type: red, brown red, brown red, brown Exudate  Color: Distinct, outline attached Distinct, outline attached Distinct, outline attached Wound Margin: None Present (0%) Large (67-100%) Medium (34-66%) Granulation A mount: N/A Red Pink, Pale Granulation Quality: None Present (0%) None Present (0%) Medium (34-66%) Necrotic A mount: Fascia: No Fat Layer (Subcutaneous Tissue): Yes Fascia: No Exposed Structures: Fat Layer (Subcutaneous Tissue): No Fascia: No Fat Layer (Subcutaneous Tissue): No Tendon: No Tendon: No Tendon: No Muscle: No Muscle: No Muscle: No Joint: No Joint: No Joint: No Bone: No Bone: No Bone: No Large (67-100%) Small (1-33%) Small (1-33%) Epithelialization: N/A N/A Debridement - Excisional Debridement: Pre-procedure Verification/Time Out N/A N/A 12:02 Taken: N/A N/A Lidocaine 4% Topical Solution Pain Control: N/A N/A Subcutaneous, Slough Tissue Debrided: N/A N/A Skin/Subcutaneous Tissue Level: N/A N/A 3.58 Debridement A (sq cm): rea N/A N/A Curette Instrument: N/A N/A Minimum Bleeding: N/A N/A Pressure Hemostasis A chieved: N/A N/A 0 Procedural Pain: N/A N/A 0 Post Procedural Pain: N/A N/A Procedure was tolerated well Debridement Treatment Response: N/A N/A 2.4x1.9x0.1 Post Debridement Measurements L x W x D (cm) N/A N/A 0.358 Post Debridement Volume: (cm) Scarring: Yes Scarring: Yes Scarring: Yes Periwound Skin Texture: Excoriation: No Excoriation: No Excoriation: No Induration: No Induration: No Induration: No Callus: No Callus: No Callus: No Crepitus: No Crepitus: No Crepitus: No Rash: No Rash: No Rash: No Maceration: No Maceration: No Maceration: No Periwound Skin Moisture: Dry/Scaly: No Dry/Scaly: No Dry/Scaly: No Atrophie Blanche: No Hemosiderin Staining: Yes Hemosiderin Staining: Yes Periwound Skin Color: Cyanosis: No Atrophie Blanche: No Atrophie Blanche: No Ecchymosis: No Cyanosis: No Cyanosis: No Erythema: No Ecchymosis: No Ecchymosis:  No Hemosiderin Staining: No Erythema: No Erythema: No Mottled: No Mottled: No Mottled: No Pallor: No Pallor: No Pallor: No Rubor: No Rubor: No Rubor: No No Abnormality No Abnormality No Abnormality Temperature: N/A N/A Debridement Procedures Performed: Treatment Notes Electronic Signature(s) Signed: 07/23/2023 2:47:41 PM By: Geralyn Corwin DO Entered By: Geralyn Corwin on 07/23/2023 12:06:19 [name] ([id number]) [id number] Page 4 of 10 -------------------------------------------------------------------------------- Multi-Disciplinary Care Plan Details Patient Name: Date of Service: [name], [name] 07/23/2023 10:15 A M Medical Record Number: [id number] Patient Account Number: [id number] Date of Birth/Sex: Treating RN: Sep 01, 1931 (89 y.o. [name] Primary Care Aldora Perman: [name] Other Clinician: Referring Elynore Dolinski: Treating Raegyn Renda/Extender: Loetta Rough in Treatment: 3 Active Inactive Nutrition Nursing Diagnoses: Potential for alteratiion in Nutrition/Potential for imbalanced nutrition Goals: Patient/caregiver agrees to and verbalizes understanding of need to obtain nutritional consultation Date Initiated: 07/02/2023 Target Resolution Date: 08/02/2023 Goal Status: Active Patient/caregiver will maintain therapeutic glucose control Date Initiated: 07/02/2023 Target Resolution Date: 08/02/2023 Goal Status: Active Interventions:  Assess HgA1c results as ordered upon admission and as needed Provide education on elevated blood sugars and impact on wound healing Provide education on nutrition Treatment Activities: Patient referred to Primary Care Physician for further nutritional evaluation : 07/02/2023 Notes: Pain, Acute or Chronic Nursing Diagnoses: Pain, acute or chronic: actual or potential Potential alteration in comfort, pain Goals: Patient will verbalize adequate pain control  and receive pain control interventions during procedures as needed Date Initiated: 07/02/2023 Target Resolution Date: 08/02/2023 Goal Status: Active Patient/caregiver will verbalize comfort level met Date Initiated: 07/02/2023 Target Resolution Date: 08/02/2023 Goal Status: Active Interventions: Encourage patient to take pain medications as prescribed Provide education on pain management Reposition patient for comfort Treatment Activities: Administer pain control measures as ordered : 07/02/2023 Notes: Wound/Skin Impairment Nursing Diagnoses: Knowledge deficit related to ulceration/compromised skin integrity Goals: Patient/caregiver will verbalize understanding of skin care regimen Date Initiated: 07/02/2023 Target Resolution Date: 08/02/2023 Goal Status: Active SHERICKA, SWAYZE ([id number]) [id number] Page 5 of 10 Interventions: Assess patient/caregiver ability to perform ulcer/skin care regimen upon admission and as needed Assess ulceration(s) every visit Provide education on ulcer and skin care Treatment Activities: Skin care regimen initiated : 07/02/2023 Topical wound management initiated : 07/02/2023 Notes: Electronic Signature(s) Signed: 07/30/2023 2:03:55 PM By: Brenton Grills Entered By: Brenton Grills on 07/23/2023 12:05:19 -------------------------------------------------------------------------------- Pain Assessment Details Patient Name: Date of Service: [name], [name] 07/23/2023 10:15 A M Medical Record Number: [id number] Patient Account Number: [id number] Date of Birth/Sex: Treating RN: [birth year] (89 y.o. F) Primary Care Wilhelmena Zea: [name] Other Clinician: Referring Shalan Neault: Treating Cherylynn Liszewski/Extender: Loetta Rough in Treatment: 3 Active Problems Location of Pain Severity and Description of Pain Patient Has Paino No Site Locations Pain Management and Medication Current Pain  Management: Electronic Signature(s) Signed: 07/25/2023 5:02:38 PM By: Thayer Dallas Entered By: Thayer Dallas on 07/23/2023 11:33:03 -------------------------------------------------------------------------------- Patient/Caregiver Education Details Patient Name: Date of Service: [name] 8/20/2024andnbsp10:15 A M Pentland, [name] ([id number]) [id number] Page 6 of 10 Medical Record Number: [id number] Patient Account Number: [id number] Date of Birth/Gender: Treating RN: 03-03-1931 (89 y.o. [name] Primary Care Physician: [name] Other Clinician: Referring Physician: Treating Physician/Extender: Loetta Rough in Treatment: 3 Education Assessment Education Provided To: Patient and Caregiver Education Topics Provided Wound/Skin Impairment: Methods: Explain/Verbal Responses: State content correctly Electronic Signature(s) Signed: 07/30/2023 2:03:55 PM By: Brenton Grills Entered By: Brenton Grills on 07/23/2023 12:05:46 -------------------------------------------------------------------------------- Wound Assessment Details Patient Name: Date of Service: [name], [name] 07/23/2023 10:15 A M Medical Record Number: [id number] Patient Account Number: [id number] Date of Birth/Sex: Treating RN: 05-19-1931 (89 y.o. [name] Primary Care Nitya Cauthon: [name] Other Clinician: Referring Avacyn Kloosterman: Treating Alanna Storti/Extender: [name]: 3 Wound Status Wound Number: 10 Primary Diabetic Wound/Ulcer of the Lower Extremity Etiology: Wound Location: Right, Medial Upper Leg Secondary Lymphedema Wounding Event: Blister Etiology: Date Acquired: 06/12/2023 Wound Healed - Epithelialized Weeks Of Treatment: 3 Status: Clustered Wound: No Comorbid Anemia, Congestive Heart Failure, Hypertension, Peripheral Pending Amputation On Presentation History:  Venous Disease, Type II Diabetes, Osteoarthritis, Neuropathy Wound Measurements Length: (cm) Width: (cm) Depth: (cm) Area: (cm) Volume: (cm) 0 % Reduction in Area: 100% 0 % Reduction in Volume: 100% 0 Epithelialization: Large (67-100%) 0 Tunneling: No 0 Undermining: No Wound Description Classification: Grade 1 Wound Margin: Distinct, outline attached Exudate Amount: Medium Exudate Type: Serosanguineous Exudate Color: red, brown Foul Odor After Cleansing: No Slough/Fibrino Yes Wound Bed Granulation Amount: None  Present (0%) Exposed Structure Necrotic Amount: None Present (0%) Fascia Exposed: No Fat Layer (Subcutaneous Tissue) Exposed: No Tendon Exposed: No Muscle Exposed: No Joint Exposed: No Bone Exposed: No [id number] ZALIAH, FELDHAUS ([id number]) [id number] Page 7 of 10 Texture Color No Abnormalities Noted: No No Abnormalities Noted: No Callus: No Atrophie Blanche: No Crepitus: No Cyanosis: No Excoriation: No Ecchymosis: No Induration: No Erythema: No Rash: No Hemosiderin Staining: No Scarring: Yes Mottled: No Pallor: No Moisture Rubor: No No Abnormalities Noted: No Dry / Scaly: No Temperature / Pain Maceration: No Temperature: No Abnormality Treatment Notes Wound #10 (Upper Leg) Wound Laterality: Right, Medial Cleanser Peri-Wound Care Topical Primary Dressing Secondary Dressing Secured With Compression Wrap Compression Stockings Add-Ons Electronic Signature(s) Signed: 07/30/2023 2:03:55 PM By: Brenton Grills Entered By: Brenton Grills on 07/23/2023 12:01:27 -------------------------------------------------------------------------------- Wound Assessment Details Patient Name: Date of Service: [name], [name] 07/23/2023 10:15 A M Medical Record Number: [id number] Patient Account Number: [id number] Date of Birth/Sex: Treating RN: February 22, 1931 (89 y.o. F) Primary Care Ebba Goll: [name] Other  Clinician: Referring Glinda Natzke: Treating Geovanni Rahming/Extender: [name]: 3 Wound Status Wound Number: 11 Primary Diabetic Wound/Ulcer of the Lower Extremity Etiology: Wound Location: Left, Proximal Lower Leg Secondary Lymphedema Wounding Event: Bite Etiology: Date Acquired: 06/12/2023 Wound Open Weeks Of Treatment: 3 Status: Clustered Wound: Yes Comorbid Anemia, Congestive Heart Failure, Hypertension, Peripheral History: Venous Disease, Type II Diabetes, Osteoarthritis, Neuropathy Photos SKYLEA, MEINE ([id number]) [id number] Page 8 of 10 Wound Measurements Length: (cm) Width: (cm) Depth: (cm) Clustered Quantity: Area: (cm) Volume: (cm) 0.4 % Reduction in Area: 97.6% 0.2 % Reduction in Volume: 97.8% 0.1 Epithelialization: Small (1-33%) 1 Tunneling: No 0.063 Undermining: No 0.006 Wound Description Classification: Grade 1 Wound Margin: Distinct, outline attached Exudate Amount: Medium Exudate Type: Serosanguineous Exudate Color: red, brown Foul Odor After Cleansing: No Slough/Fibrino Yes Wound Bed Granulation Amount: Large (67-100%) Exposed Structure Granulation Quality: Red Fascia Exposed: No Necrotic Amount: None Present (0%) Fat Layer (Subcutaneous Tissue) Exposed: Yes Tendon Exposed: No Muscle Exposed: No Joint Exposed: No Bone Exposed: No Periwound Skin Texture Texture Color No Abnormalities Noted: No No Abnormalities Noted: No Callus: No Atrophie Blanche: No Crepitus: No Cyanosis: No Excoriation: No Ecchymosis: No Induration: No Erythema: No Rash: No Hemosiderin Staining: Yes Scarring: Yes Mottled: No Pallor: No Moisture Rubor: No No Abnormalities Noted: No Dry / Scaly: No Temperature / Pain Maceration: No Temperature: No Abnormality Electronic Signature(s) Signed: 07/25/2023 5:02:38 PM By: Thayer Dallas Entered By: Thayer Dallas on 07/23/2023  11:39:45 -------------------------------------------------------------------------------- Wound Assessment Details Patient Name: Date of Service: [name], [name] 07/23/2023 10:15 A M Medical Record Number: [id number] Patient Account Number: [id number] Date of Birth/Sex: Treating RN: March 14, 1931 (89 y.o. F) Primary Care Hanan Mcwilliams: [name] Other Clinician: Referring Tamla Winkels: Treating Kainen Struckman/Extender: Loetta Rough in Treatment: 3 Wound Status [name], [name] ([id number]) [id number] Page 9 of 10 Wound Number: 12 Primary Diabetic Wound/Ulcer of the Lower Extremity Etiology: Wound Location: Left, Distal, Anterior Lower Leg Secondary Lymphedema Wounding Event: Blister Etiology: Date Acquired: 06/12/2023 Wound Open Weeks Of Treatment: 3 Status: Clustered Wound: No Comorbid Anemia, Congestive Heart Failure, Hypertension, Peripheral History: Venous Disease, Type II Diabetes, Osteoarthritis, Neuropathy Photos Wound Measurements Length: (cm) 2.4 Width: (cm) 1.9 Depth: (cm) 0.1 Area: (cm) 3.581 Volume: (cm) 0.358 % Reduction in Area: 5.6% % Reduction in Volume: 5.5% Epithelialization: Small (1-33%) Tunneling: No Undermining: No Wound Description Classification: Grade 1 Wound Margin: Distinct, outline attached Exudate Amount:  Medium Exudate Type: Serosanguineous Exudate Color: red, brown Foul Odor After Cleansing: No Slough/Fibrino Yes Wound Bed Granulation Amount: Medium (34-66%) Exposed Structure Granulation Quality: Pink, Pale Fascia Exposed: No Necrotic Amount: Medium (34-66%) Fat Layer (Subcutaneous Tissue) Exposed: No Necrotic Quality: Adherent Slough Tendon Exposed: No Muscle Exposed: No Joint Exposed: No Bone Exposed: No Periwound Skin Texture Texture Color No Abnormalities Noted: No No Abnormalities Noted: No Callus: No Atrophie Blanche: No Crepitus: No Cyanosis: No Excoriation:  No Ecchymosis: No Induration: No Erythema: No Rash: No Hemosiderin Staining: Yes Scarring: Yes Mottled: No Pallor: No Moisture Rubor: No No Abnormalities Noted: No Dry / Scaly: No Temperature / Pain Maceration: No Temperature: No Abnormality Electronic Signature(s) Signed: 07/25/2023 5:02:38 PM By: Thayer Dallas Entered By: Thayer Dallas on 07/23/2023 11:41:09 Vitals Details -------------------------------------------------------------------------------- [name] ([id number]) [id number] Page 10 of 10 Patient Name: Date of Service: [name], [name] 07/23/2023 10:15 A M Medical Record Number: [id number] Patient Account Number: [id number] Date of Birth/Sex: Treating RN: [birth year] (89 y.o. F) Primary Care Audree Schrecengost: [name] Other Clinician: Referring Haylin Camilli: Treating Sherion Dooly/Extender: Loetta Rough in Treatment: 3 Vital Signs Time Taken: 11:47 Temperature (F): 97.7 Height (in): 62 Pulse (bpm): 69 Weight (lbs): 220 Respiratory Rate (breaths/min): 18 Body Mass Index (BMI): 40.2 Blood Pressure (mmHg): 113/68 Reference Range: 80 - 120 mg / dl Electronic Signature(s) Signed: 07/25/2023 5:02:38 PM By: Thayer Dallas Entered By: Thayer Dallas on 07/23/2023 11:48:12

## 2023-07-30 NOTE — Progress Notes (Signed)
[name] ([id number]) [id number] Page 1 of 8 Visit Report for 07/30/2023 Chief Complaint Document Details Patient Name: Date of Service: [name], [name] 07/30/2023 11:15 A M Medical Record Number: [id number] Patient Account Number: [id number] Date of Birth/Sex: Treating RN: [birth year] (89 y.o. F) Primary Care Provider: Alva [name] Other Clinician: Referring Provider: Treating Provider/Extender: [name] in Treatment: 4 Information Obtained from: Patient Chief Complaint 07/02/2023; bilateral lower extremity wounds Electronic Signature(s) Signed: 07/30/2023 12:58:11 PM By: Katherine Corwin DO Entered By: [name] on 07/30/2023 09:28:40 -------------------------------------------------------------------------------- HPI Details Patient Name: Date of Service: [name] 07/30/2023 11:15 A M Medical Record Number: [id number] Patient Account Number: [id number] Date of Birth/Sex: Treating RN: Nov 19, 1931 (89 y.o. 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 She is not quite sure how the right upper leg wound started. The left lower extremity wound started out as blisters. She has compression stockings but has not been wearing them. She takes Lasix 20 mg daily. She currently denies signs of infection. In office ABIs were 0.57 on the left however dorsalis pedal pulses and posterior tibialis pulses were heard on Doppler. 8/6; patient presents for  follow-up. We have been using antibiotic ointment with Hydrofera Blue under Kerlix/Coban to the left lower extremity. Hydrofera Blue to the right lower leg. She came in for nurse visit and wrap change. She reports no issues. She reports that she is scheduled for her ABIs next week. 8/13; patient presents for follow-up. We have been using Hydrofera Blue and Santyl to the left lower extremity under Kerlix/Coban. She has been using Hydrofera Blue to the right upper leg wound. She had ABIs completed yesterday that showed an ABI of 0.91 on the left and 0.92 on the right. She has no issues or complaints today. 8/20; patient presents for follow-up. We have been using Hydrofera Blue and Santyl to the left lower extremity under 3 layer compression and Hydrofera Blue to the right upper leg wound. The right upper leg wound has healed. She feels that the wrap was too tight with the 3 layer. We will go back down to Kerlix/Coban. Other than that she has no issues or complaints. She denies signs of infection. Wounds Appear smaller. 8/27; patient presents for follow-up. We have been using Hydrofera Blue and Santyl to the left lower extremity wounds under Kerlix/Coban. She tolerated this well. She has no issues or complaints today. Wounds are smaller. Electronic Signature(s) Signed: 07/30/2023 12:58:11 PM By: Katherine Corwin DO Entered By: [name] on 07/30/2023 09:29:24 [name] ([id number]) [id number] Page 2 of 8 -------------------------------------------------------------------------------- Physical Exam Details Patient Name: Date of Service: [name], [name] 07/30/2023 11:15 A M Medical Record Number: [id number] Patient Account Number: [id number] Date of Birth/Sex: Treating RN: Feb 08, 1931 (89 y.o. F) Primary Care Provider: Alva [name] Other Clinician: Referring Provider: Treating Provider/Extender: [name] in  Treatment: 4 Constitutional respirations regular, non-labored and within target range for patient.. Cardiovascular 2+ dorsalis pedis/posterior tibialis pulses. Psychiatric pleasant and cooperative. Notes T the anterior aspect there are open wounds with granulation tissue and fibrinous tissue. Good edema control.  No signs of infection. o Electronic Signature(s) Signed: 07/30/2023 12:58:11 PM By: Katherine Corwin DO Entered By: [name] on 07/30/2023 09:31:06 -------------------------------------------------------------------------------- Physician Orders Details Patient Name: Date of Service: [name], [name] 07/30/2023 11:15 A M Medical Record Number: [id number] Patient Account Number: [id number] Date of Birth/Sex: Treating RN: 1931/04/29 (89 y.o. [name] Primary Care Provider: Alva [name] Other Clinician: Referring Provider: Treating Provider/Extender: [name] in Treatment: 4 Verbal / Phone Orders: No Diagnosis Coding Follow-up Appointments ppointment in 1 week. - Dr. Mikey Bussing Tuesday Return A Room 8 Return Appointment in 2 weeks. Anesthetic (In clinic) Topical Lidocaine 4% applied to wound bed Bathing/ Shower/ Hygiene May shower with protection but do not get wound dressing(s) wet. Protect dressing(s) with water repellant cover (for example, large plastic bag) or a cast cover and may then take shower. Edema Control - Lymphedema / SCD / Other Elevate legs to the level of the heart or above for 30 minutes daily and/or when sitting for 3-4 times a day throughout the day. Avoid standing for long periods of time. Additional Orders / Instructions Follow Nutritious Diet - increase protein Juven Shake 1-2 times daily. Wound Treatment Wound #11 - Lower Leg Wound Laterality: Left, Proximal Cleanser: Soap and Water 1 x Per Week/30 Days Discharge Instructions: May shower and wash wound with dial antibacterial soap and water  prior to dressing change. [name], [name] ([id number]) [id number] Page 3 of 8 Cleanser: Vashe 5.8 (oz) 1 x Per Week/30 Days Discharge Instructions: Cleanse the wound with Vashe prior to applying a clean dressing using gauze sponges, not tissue or cotton balls. Peri-Wound Care: Sween Lotion (Moisturizing lotion) 1 x Per Week/30 Days Discharge Instructions: Apply moisturizing lotion as directed Prim Dressing: Hydrofera Blue Ready Transfer Foam, 2.5x2.5 (in/in) 1 x Per Week/30 Days ary Discharge Instructions: Apply directly to wound bed as directed Prim Dressing: Santyl Ointment 1 x Per Week/30 Days ary Discharge Instructions: Apply nickel thick amount to wound bed as instructed Secondary Dressing: ABD Pad, 8x10 1 x Per Week/30 Days Discharge Instructions: Apply over primary dressing as directed. Secondary Dressing: Woven Gauze Sponge, Non-Sterile 4x4 in 1 x Per Week/30 Days Discharge Instructions: Apply over primary dressing as directed. Compression Wrap: Kerlix Roll 4.5x3.1 (in/yd) 1 x Per Week/30 Days Discharge Instructions: Apply Kerlix and Coban compression as directed. Compression Wrap: Coban Self-Adherent Wrap 4x5 (in/yd) 1 x Per Week/30 Days Discharge Instructions: Apply over Kerlix as directed. Wound #12 - Lower Leg Wound Laterality: Left, Anterior, Distal Cleanser: Soap and Water 1 x Per Week/30 Days Discharge Instructions: May shower and wash wound with dial antibacterial soap and water prior to dressing change. Cleanser: Vashe 5.8 (oz) 1 x Per Week/30 Days Discharge Instructions: Cleanse the wound with Vashe prior to applying a clean dressing using gauze sponges, not tissue or cotton balls. Peri-Wound Care: Sween Lotion (Moisturizing lotion) 1 x Per Week/30 Days Discharge Instructions: Apply moisturizing lotion as directed Prim Dressing: Hydrofera Blue Ready Transfer Foam, 2.5x2.5 (in/in) 1 x Per Week/30 Days ary Discharge Instructions: Apply  directly to wound bed as directed Prim Dressing: Santyl Ointment 1 x Per Week/30 Days ary Discharge Instructions: Apply nickel thick amount to wound bed as instructed Secondary Dressing: ABD Pad, 8x10 1 x Per Week/30 Days Discharge Instructions: Apply over primary dressing as directed. Secondary Dressing: Woven Gauze Sponge, Non-Sterile 4x4 in 1 x Per Week/30 Days Discharge Instructions: Apply over primary dressing as directed. Compression Wrap: Kerlix Roll 4.5x3.1 (in/yd) 1 x Per Week/30  Days Discharge Instructions: Apply Kerlix and Coban compression as directed. Compression Wrap: Coban Self-Adherent Wrap 4x5 (in/yd) 1 x Per Week/30 Days Discharge Instructions: Apply over Kerlix as directed. Electronic Signature(s) Signed: 07/30/2023 12:58:11 PM By: Katherine Corwin DO Entered By: [name] on 07/30/2023 09:31:15 -------------------------------------------------------------------------------- Problem List Details Patient Name: Date of Service: [name], [name] 07/30/2023 11:15 A M Medical Record Number: [id number] Patient Account Number: [id number] Date of Birth/Sex: Treating RN: [birth year] (89 y.o. F) Primary Care Provider: Alva [name] Other Clinician: Referring Provider: Treating Provider/Extender: [name] in Treatment: 4 [place], [place] G ([id number]) [id number] Page 4 of 8 Active Problems ICD-10 Encounter Code Description Active Date MDM Diagnosis L97.812 Non-pressure chronic ulcer of other part of right lower leg with fat layer 07/02/2023 No Yes exposed L97.822 Non-pressure chronic ulcer of other part of left lower leg with fat layer exposed7/30/2024 No Yes I89.0 Lymphedema, not elsewhere classified 07/02/2023 No Yes E11.622 Type 2 diabetes mellitus with other skin ulcer 07/02/2023 No Yes I50.42 Chronic combined systolic (congestive) and diastolic (congestive) heart failure 07/02/2023 No Yes Inactive  Problems Resolved Problems Electronic Signature(s) Signed: 07/30/2023 12:58:11 PM By: Katherine Corwin DO Entered By: [name] on 07/30/2023 09:28:19 -------------------------------------------------------------------------------- Progress Note Details Patient Name: Date of Service: [name] 07/30/2023 11:15 A M Medical Record Number: [id number] Patient Account Number: [id number] Date of Birth/Sex: Treating RN: 1931/06/30 (89 y.o. F) Primary Care Provider: Alva [name] Other Clinician: Referring Provider: Treating Provider/Extender: [name] in Treatment: 4 Subjective Chief Complaint Information obtained from Patient 07/02/2023; bilateral lower extremity wounds History of Present Illness (HPI) 07/02/2023 Ms. 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 She is not quite sure how the right upper leg wound started. The left lower extremity wound started out as blisters. She has compression stockings but has not been wearing them. She takes Lasix 20 mg daily. She currently denies signs of infection. In office ABIs were 0.57 on the left however dorsalis pedal pulses and posterior tibialis pulses were heard on Doppler. 8/6; patient presents for follow-up. We have been using antibiotic ointment with Hydrofera Blue under Kerlix/Coban to the left lower extremity. Hydrofera Blue to the right lower leg. She came in for nurse visit and wrap change. She reports no issues. She reports that she is scheduled for her ABIs next week. 8/13; patient presents for follow-up. We have been using Hydrofera Blue and Santyl to the left lower extremity under Kerlix/Coban. She has been using Hydrofera Blue to the right upper leg  wound. She had ABIs completed yesterday that showed an ABI of 0.91 on the left and 0.92 on the right. She has no issues or complaints today. 8/20; patient presents for follow-up. We have been using Hydrofera Blue and Santyl to the left lower extremity under 3 layer compression and Hydrofera Blue to the right upper leg wound. The right upper leg wound has healed. She feels that the wrap was too tight with the 3 layer. We will go back down to Kerlix/Coban. Other than that she has no issues or complaints. She denies signs of infection. Wounds Appear smaller. 8/27; patient presents for follow-up. We have been using Hydrofera Blue and Santyl to the left lower extremity wounds under Kerlix/Coban. She tolerated this [name], [name] ([id number])  [id number] Page 5 of 8 well. She has no issues or complaints today. Wounds are smaller. Patient History Medical History Hematologic/Lymphatic Patient has history of Anemia Cardiovascular Patient has history of Congestive Heart Failure, Hypertension, Peripheral Venous Disease Endocrine Patient has history of Type II Diabetes Musculoskeletal Patient has history of Osteoarthritis Neurologic Patient has history of Neuropathy Medical A Surgical History Notes nd Cardiovascular hyperlipidemia, venous stasis Gastrointestinal GERD Psychiatric generalized anxiety Objective Constitutional respirations regular, non-labored and within target range for patient.. Vitals Time Taken: 11:30 AM, Height: 62 in, Weight: 220 lbs, BMI: 40.2, Pulse: 61 bpm, Respiratory Rate: 18 breaths/min, Blood Pressure: 126/60 mmHg. Cardiovascular 2+ dorsalis pedis/posterior tibialis pulses. Psychiatric pleasant and cooperative. General Notes: T the anterior aspect there are open wounds with granulation tissue and fibrinous tissue. Good edema control. No signs of infection. o Integumentary (Hair, Skin) Wound #11 status is Open. Original cause of wound  was Bite. The date acquired was: 06/12/2023. The wound has been in treatment 4 weeks. The wound is located on the Left,Proximal Lower Leg. The wound measures 0.1cm length x 0.1cm width x 0.1cm depth; 0.008cm^2 area and 0.001cm^3 volume. There is Fat Layer (Subcutaneous Tissue) exposed. There is a medium amount of serosanguineous drainage noted. The wound margin is distinct with the outline attached to the wound base. There is large (67-100%) red granulation within the wound bed. There is no necrotic tissue within the wound bed. The periwound skin appearance exhibited: Scarring, Hemosiderin Staining. The periwound skin appearance did not exhibit: Callus, Crepitus, Excoriation, Induration, Rash, Dry/Scaly, Maceration, Atrophie Blanche, Cyanosis, Ecchymosis, Mottled, Pallor, Rubor, Erythema. Periwound temperature was noted as No Abnormality. Wound #12 status is Open. Original cause of wound was Blister. The date acquired was: 06/12/2023. The wound has been in treatment 4 weeks. The wound is located on the [facility] Lower Leg. The wound measures 2.1cm length x 1cm width x 0.1cm depth; 1.649cm^2 area and 0.165cm^3 volume. There is a medium amount of serosanguineous drainage noted. The wound margin is distinct with the outline attached to the wound base. There is medium (34-66%) pink, pale granulation within the wound bed. There is a medium (34-66%) amount of necrotic tissue within the wound bed including Adherent Slough. The periwound skin appearance exhibited: Scarring, Hemosiderin Staining. The periwound skin appearance did not exhibit: Callus, Crepitus, Excoriation, Induration, Rash, Dry/Scaly, Maceration, Atrophie Blanche, Cyanosis, Ecchymosis, Mottled, Pallor, Rubor, Erythema. Periwound temperature was noted as No Abnormality. 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 No need for debridement today. I recommended continue the course of Santyl and Hydrofera Blue under Kerlix/Coban to the left lower extremity. Follow-up in 1 week. [name], [name] ([id number]) [id number] Page 6 of 8 Plan Follow-up Appointments: Return Appointment in 1 week. - Dr. Mikey Bussing Tuesday Room 8 Return Appointment in 2 weeks. Anesthetic: (In clinic) Topical Lidocaine 4% applied to wound bed Bathing/ Shower/ Hygiene: May shower with protection but do not get wound dressing(s) wet. Protect dressing(s) with water repellant cover (for example, large plastic bag) or a cast cover and may then take shower. Edema Control - Lymphedema / SCD / Other: Elevate legs to the level of the heart or above for 30  minutes daily and/or when sitting for 3-4 times a day throughout the day. Avoid standing for long periods of time. Additional Orders / Instructions: Follow Nutritious Diet - increase protein Juven Shake 1-2 times daily. WOUND #11: - Lower Leg Wound Laterality: Left, Proximal Cleanser: Soap and Water 1 x Per Week/30 Days Discharge Instructions: May shower and wash wound with dial antibacterial soap and water prior to dressing change. Cleanser: Vashe 5.8 (oz) 1 x Per Week/30 Days Discharge Instructions: Cleanse the wound with Vashe prior to applying a clean dressing using gauze sponges, not tissue or cotton balls. Peri-Wound Care: Sween Lotion (Moisturizing lotion) 1 x Per Week/30 Days Discharge Instructions: Apply moisturizing lotion as directed Prim Dressing: Hydrofera Blue Ready Transfer Foam, 2.5x2.5 (in/in) 1 x Per Week/30 Days ary Discharge Instructions: Apply directly to  wound bed as directed Prim Dressing: Santyl Ointment 1 x Per Week/30 Days ary Discharge Instructions: Apply nickel thick amount to wound bed as instructed Secondary Dressing: ABD Pad, 8x10 1 x Per Week/30 Days Discharge Instructions: Apply over primary dressing as directed. Secondary Dressing: Woven Gauze Sponge, Non-Sterile 4x4 in 1 x Per Week/30 Days Discharge Instructions: Apply over primary dressing as directed. Com pression Wrap: Kerlix Roll 4.5x3.1 (in/yd) 1 x Per Week/30 Days Discharge Instructions: Apply Kerlix and Coban compression as directed. Com pression Wrap: Coban Self-Adherent Wrap 4x5 (in/yd) 1 x Per Week/30 Days Discharge Instructions: Apply over Kerlix as directed. WOUND #12: - Lower Leg Wound Laterality: Left, Anterior, Distal Cleanser: Soap and Water 1 x Per Week/30 Days Discharge Instructions: May shower and wash wound with dial antibacterial soap and water prior to dressing change. Cleanser: Vashe 5.8 (oz) 1 x Per Week/30 Days Discharge Instructions: Cleanse the wound with Vashe prior to applying a clean dressing using gauze sponges, not tissue or cotton balls. Peri-Wound Care: Sween Lotion (Moisturizing lotion) 1 x Per Week/30 Days Discharge Instructions: Apply moisturizing lotion as directed Prim Dressing: Hydrofera Blue Ready Transfer Foam, 2.5x2.5 (in/in) 1 x Per Week/30 Days ary Discharge Instructions: Apply directly to wound bed as directed Prim Dressing: Santyl Ointment 1 x Per Week/30 Days ary Discharge Instructions: Apply nickel thick amount to wound bed as instructed Secondary Dressing: ABD Pad, 8x10 1 x Per Week/30 Days Discharge Instructions: Apply over primary dressing as directed. Secondary Dressing: Woven Gauze Sponge, Non-Sterile 4x4 in 1 x Per Week/30 Days Discharge Instructions: Apply over primary dressing as directed. Com pression Wrap: Kerlix Roll 4.5x3.1 (in/yd) 1 x Per Week/30 Days Discharge Instructions: Apply Kerlix and Coban compression as  directed. Com pression Wrap: Coban Self-Adherent Wrap 4x5 (in/yd) 1 x Per Week/30 Days Discharge Instructions: Apply over Kerlix as directed. 1. Hydrofera Blue and Santyl under Kerlix/Coban to the left lower extremity 2. Follow-up in 1 week Electronic Signature(s) Signed: 07/30/2023 12:58:11 PM By: Katherine Corwin DO Entered By: [name] on 07/30/2023 09:31:56 -------------------------------------------------------------------------------- HxROS Details Patient Name: Date of Service: [name] 07/30/2023 11:15 A M Medical Record Number: [id number] Patient Account Number: [id number] Date of Birth/Sex: Treating RN: Nov 09, 1931 (89 y.o. F) Primary Care Provider: Alva [name] Other Clinician: Referring Provider: Treating Provider/Extender: [name] in Treatment: 4 Hematologic/Lymphatic [name], [name] ([id number]) [id number] Page 7 of 8 Medical History: Positive for: Anemia Cardiovascular Medical History: Positive for: Congestive Heart Failure; Hypertension; Peripheral Venous Disease Past Medical History Notes: hyperlipidemia, venous stasis Gastrointestinal Medical History: Past Medical History Notes: GERD Endocrine Medical History: Positive for: Type II Diabetes Musculoskeletal Medical History: Positive for: Osteoarthritis Neurologic Medical History:  Positive for: Neuropathy Psychiatric Medical History: Past Medical History Notes: generalized anxiety Immunizations Pneumococcal Vaccine: Received Pneumococcal Vaccination: Yes Received Pneumococcal Vaccination On or After 60th Birthday: Yes Implantable Devices No devices added Electronic Signature(s) Signed: 07/30/2023 12:58:11 PM By: Katherine Corwin DO Entered By: [name] on 07/30/2023 09:29:29 -------------------------------------------------------------------------------- SuperBill Details Patient Name: Date of  Service: [name] 07/30/2023 Medical Record Number: [id number] Patient Account Number: [id number] Date of Birth/Sex: Treating RN: 08/11/1931 (89 y.o. [name] Primary Care Provider: Alva [name] Other Clinician: Referring Provider: Treating Provider/Extender: [name] in Treatment: 4 Diagnosis Coding ICD-10 Codes Code Description [phone] Non-pressure chronic ulcer of other part of right lower leg with fat layer exposed L97.822 Non-pressure chronic ulcer of other part of left lower leg with fat layer exposed [name], TUONG ([id number]) [id number].pdf Page 8 of 8 I89.0 Lymphedema, not elsewhere classified E11.622 Type 2 diabetes mellitus with other skin ulcer I50.42 Chronic combined systolic (congestive) and diastolic (congestive) heart failure Facility Procedures : CPT4 Code: [id number] Description: 99213 - WOUND CARE VISIT-LEV 3 EST PT Modifier: Quantity: 1 Physician Procedures : CPT4 Code Description Modifier [id number] 99213 - WC PHYS LEVEL 3 - EST PT ICD-10 Diagnosis Description L97.822 Non-pressure chronic ulcer of other part of left lower leg with fat layer exposed E11.622 Type 2 diabetes mellitus with other skin ulcer I89.0  Lymphedema, not elsewhere classified Quantity: 1 Electronic Signature(s) Signed: 07/30/2023 12:58:11 PM By: Katherine Corwin DO Entered By: [name] on 07/30/2023 09:32:14

## 2023-08-06 ENCOUNTER — Encounter (HOSPITAL_BASED_OUTPATIENT_CLINIC_OR_DEPARTMENT_OTHER): Payer: Medicare PPO | Attending: Internal Medicine | Admitting: Internal Medicine

## 2023-08-06 ENCOUNTER — Encounter: Payer: Self-pay | Admitting: Cardiovascular Disease

## 2023-08-06 ENCOUNTER — Ambulatory Visit: Payer: Medicare PPO | Attending: Cardiovascular Disease | Admitting: Cardiovascular Disease

## 2023-08-06 VITALS — BP 100/50 | HR 66 | Ht 62.0 in | Wt 195.8 lb

## 2023-08-06 DIAGNOSIS — E11622 Type 2 diabetes mellitus with other skin ulcer: Secondary | ICD-10-CM | POA: Diagnosis present

## 2023-08-06 DIAGNOSIS — L97812 Non-pressure chronic ulcer of other part of right lower leg with fat layer exposed: Secondary | ICD-10-CM | POA: Insufficient documentation

## 2023-08-06 DIAGNOSIS — E782 Mixed hyperlipidemia: Secondary | ICD-10-CM

## 2023-08-06 DIAGNOSIS — M1990 Unspecified osteoarthritis, unspecified site: Secondary | ICD-10-CM | POA: Insufficient documentation

## 2023-08-06 DIAGNOSIS — I83009 Varicose veins of unspecified lower extremity with ulcer of unspecified site: Secondary | ICD-10-CM | POA: Diagnosis not present

## 2023-08-06 DIAGNOSIS — E1140 Type 2 diabetes mellitus with diabetic neuropathy, unspecified: Secondary | ICD-10-CM | POA: Insufficient documentation

## 2023-08-06 DIAGNOSIS — I519 Heart disease, unspecified: Secondary | ICD-10-CM | POA: Diagnosis not present

## 2023-08-06 DIAGNOSIS — I2510 Atherosclerotic heart disease of native coronary artery without angina pectoris: Secondary | ICD-10-CM | POA: Diagnosis not present

## 2023-08-06 DIAGNOSIS — R600 Localized edema: Secondary | ICD-10-CM

## 2023-08-06 DIAGNOSIS — I10 Essential (primary) hypertension: Secondary | ICD-10-CM

## 2023-08-06 DIAGNOSIS — L97909 Non-pressure chronic ulcer of unspecified part of unspecified lower leg with unspecified severity: Secondary | ICD-10-CM

## 2023-08-06 DIAGNOSIS — I890 Lymphedema, not elsewhere classified: Secondary | ICD-10-CM | POA: Insufficient documentation

## 2023-08-06 DIAGNOSIS — L97822 Non-pressure chronic ulcer of other part of left lower leg with fat layer exposed: Secondary | ICD-10-CM | POA: Insufficient documentation

## 2023-08-06 DIAGNOSIS — I5042 Chronic combined systolic (congestive) and diastolic (congestive) heart failure: Secondary | ICD-10-CM | POA: Diagnosis not present

## 2023-08-06 DIAGNOSIS — I340 Nonrheumatic mitral (valve) insufficiency: Secondary | ICD-10-CM

## 2023-08-06 MED ORDER — FUROSEMIDE 40 MG PO TABS: 40.0000 mg | ORAL_TABLET | Freq: Every day | ORAL | Status: DC

## 2023-08-06 NOTE — Assessment & Plan Note (Signed)
History of hyperlipidemia on Lovaza, simvastatin followed by her PCP.

## 2023-08-06 NOTE — Assessment & Plan Note (Signed)
Mild MR by 2D echo performed 11/09/2014.

## 2023-08-06 NOTE — Assessment & Plan Note (Signed)
History of essential hypertension blood pressure measured today at 100/50.  She is on amlodipine, metoprolol, olmesartan and hydralazine.

## 2023-08-06 NOTE — Patient Instructions (Signed)

## 2023-08-06 NOTE — Assessment & Plan Note (Signed)
History of left lower extremity venous ulcer followed by Dr. Geralyn Corwin.

## 2023-08-06 NOTE — Assessment & Plan Note (Signed)
Bilateral lower extremity edema on furosemide which has improved.  She has minimal edema on the right.  Her left leg is wrapped.

## 2023-08-06 NOTE — Progress Notes (Signed)
08/06/2023 [name]   1931-02-21  [id number]  Primary Physician Andi Devon, MD Primary Cardiologist: Runell Gess MD FACP, [place], [place], [place]  HPI:  [name] is a 89 y.o.   moderately overweight widowed African American female, mother of 50, grandmother to 43 grandchildren, who  I saw her in the office 07/03/2022.  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 She was catheterized by Dr. Daphene Jaeger, June 06, 2012, revealing a similar EF with no significant CAD, but this was in the setting of sepsis, requiring intubation, and congestive heart failure, responding to antibiotics and diuresis. Her other problems include hypertension, hyperlipidemia, and diabetes.  She is otherwise asymptomatic. Since I saw her one year ago she's remained clinically stable. She has had some venous stasis ulcers on her legs followed by Dr. Shella Spearing at the [facility] wound care clinic. These ulcers have since healed and she wears compression stockings. She had a bladder tack operation for prolapsed bladder at [facility] on October 12 , 2017.     Since I saw her in the office a year ago she continues to do well.  She continues to take furosemide for lower extremity edema which she increases when necessary.  She wears compression stockings.  She walks with a walker.  She denies chest pain or shortness of breath.  She apparently has a left lower extremity venous stasis ulcer followed by Dr. Mikey Bussing at the wound care center.   Current Meds  Medication Sig   ACCU-CHEK SOFTCLIX LANCETS lancets    acidophilus (RISAQUAD) CAPS Take 1 capsule by mouth daily.   ALPRAZolam (XANAX) 0.25 MG tablet Take 0.25 mg by mouth 2 (two) times daily as needed.    amLODipine (NORVASC) 10 MG tablet    aspirin 81 MG chewable tablet Chew 81 mg by mouth daily.    benzonatate (TESSALON) 100 MG capsule    bimatoprost (LUMIGAN) 0.01 % SOLN Lumigan 0.01 % eye drops   Blood Glucose Monitoring Suppl (ACCU-CHEK AVIVA PLUS) W/DEVICE KIT    calcium-vitamin D (OSCAL WITH D) 500-200 MG-UNIT per tablet Take 1 tablet by mouth daily.   Coenzyme Q10 (CO Q-10) 200 MG CAPS Take 200 mg by mouth daily.   FeFum-FePoly-FA-B Cmp-C-Biot (INTEGRA PLUS) CAPS daily.    hydrALAZINE (APRESOLINE) 50 MG tablet Take 1.5 tablets (75 mg total) by mouth 2 (two) times daily.   metoprolol succinate (TOPROL-XL) 25 MG 24 hr tablet Take 0.5 tablets (12.5 mg total) by mouth daily. Take with or immediately following a meal.   Multiple Vitamin (MULTI-VITAMIN PO) Take 1 tablet by mouth daily.   MYRBETRIQ 25 MG TB24 tablet    olmesartan (BENICAR) 40 MG tablet    omega-3 acid ethyl esters (LOVAZA) 1 G capsule Take 2 g by mouth daily.   omeprazole (PRILOSEC) 40 MG capsule Take 40 mg by mouth 2 (two) times daily.    potassium chloride SA (KLOR-CON M) 20 MEQ tablet TAKE 1 TABLET EVERY DAY   simvastatin (ZOCOR) 10 MG tablet    traMADol (ULTRAM) 50 MG tablet Take 50 mg by mouth 2 (two) times daily as needed.   [DISCONTINUED] furosemide (LASIX) 20 MG tablet TAKE 1 TABLET EVERY DAY     Allergies  Allergen Reactions   Lisinopril Other (See Comments) and Cough    cough cough    Social History   Socioeconomic History   Marital status: Widowed    Spouse name: Not on file   Number of children: 6   Years of education: Not on file   Highest education level: Not on file  Occupational History   Not on file  Tobacco Use   Smoking status: Never   Smokeless tobacco: Never  Substance and Sexual Activity   Alcohol use: No    Comment: 11/30/11 "used to drink; not much; don't drink anymore"   Drug use: No   Sexual activity: Never  Other Topics Concern   Not on file  Social History Narrative   Not  on file   Social Determinants of Health   Financial Resource Strain: Not on file  Food Insecurity: Not on file  Transportation Needs: Not on file  Physical Activity: Not on file  Stress: Not on file  Social Connections: Not on file  Intimate Partner Violence: Not on file     Review of Systems: General: negative for chills, fever, night sweats or weight changes.  Cardiovascular: negative for chest pain, dyspnea on exertion, edema, orthopnea, palpitations, paroxysmal nocturnal dyspnea or shortness of breath Dermatological: negative for rash Respiratory: negative for cough or wheezing Urologic: negative for hematuria Abdominal: negative for nausea, vomiting, diarrhea, bright red blood per rectum, melena, or hematemesis Neurologic: negative for visual changes, syncope, or dizziness All other systems reviewed and are otherwise negative except as noted above.    Blood pressure (!) 100/50, pulse 66, height 5\' 2"  (1.575 m), weight 195 lb 12.8 oz (88.8 kg), SpO2 92%.  General appearance: alert and no distress Neck: no adenopathy, no carotid bruit, no JVD, supple, symmetrical, trachea midline, and thyroid not enlarged, symmetric, no tenderness/mass/nodules Lungs: clear to auscultation bilaterally Heart: Regular rate and rhythm without murmurs, rubs or clicks Extremities: extremities normal, atraumatic, no cyanosis or edema Pulses: 2+ and symmetric Skin: Skin color, texture, turgor normal. No rashes or lesions Neurologic: Grossly normal  EKG EKG Interpretation Date/Time:  Tuesday August 06 2023 08:55:35 EDT Ventricular Rate:  66 PR Interval:  178 QRS Duration:  62 QT Interval:  358 QTC Calculation: 375 R Axis:   -79  Text Interpretation: Sinus rhythm with frequent Premature ventricular complexes Left axis deviation Low voltage QRS Inferior infarct , age undetermined Anterolateral infarct , age undetermined When compared with ECG of 01-Aug-2014 04:24, PREVIOUS ECG IS PRESENT  Confirmed by Nanetta Batty [phone]) on 08/06/2023 9:03:50 AM    ASSESSMENT AND PLAN:   HTN (hypertension) History of essential hypertension blood pressure measured today at 100/50.  She is on amlodipine, metoprolol, olmesartan and hydralazine.  Venous ulcer LE, chronic, followed at wound center, ABIs Jan 2013 WNL History of left lower extremity venous ulcer followed by Dr. Geralyn Corwin.  Diastolic dysfunction, left ventricle, grade 2 by echo 06/04/12 History of normal LV systolic function with grade 1 diastolic dysfunction by 2D echo performed 11/09/2014.  She does have left lower extremity IMA worse than right.  She was on 20 g Lasix which is increased to 40 mg of Lasix.  She is less 15 pounds since I saw her a year ago.  She has no edema on the right side.  Mitral regurgitation, mod by echo 06/03/12 Mild MR by 2D echo performed 11/09/2014.  Hyperlipidemia History of hyperlipidemia on Lovaza, simvastatin followed by her PCP.  Bilateral lower extremity edema Bilateral lower extremity  edema on furosemide which has improved.  She has minimal edema on the right.  Her left leg is wrapped.     Runell Gess MD FACP,FACC,FAHA, [facility] 08/06/2023 9:12 AM

## 2023-08-06 NOTE — Assessment & Plan Note (Signed)
History of normal LV systolic function with grade 1 diastolic dysfunction by 2D echo performed 11/09/2014.  She does have left lower extremity IMA worse than right.  She was on 20 g Lasix which is increased to 40 mg of Lasix.  She is less 15 pounds since I saw her a year ago.  She has no edema on the right side.

## 2023-08-12 ENCOUNTER — Encounter: Payer: Self-pay | Admitting: Podiatry

## 2023-08-12 ENCOUNTER — Ambulatory Visit (INDEPENDENT_AMBULATORY_CARE_PROVIDER_SITE_OTHER): Payer: Medicare PPO | Admitting: Podiatry

## 2023-08-12 DIAGNOSIS — E1142 Type 2 diabetes mellitus with diabetic polyneuropathy: Secondary | ICD-10-CM

## 2023-08-12 DIAGNOSIS — B351 Tinea unguium: Secondary | ICD-10-CM

## 2023-08-12 DIAGNOSIS — M79674 Pain in right toe(s): Secondary | ICD-10-CM | POA: Diagnosis not present

## 2023-08-12 DIAGNOSIS — M79675 Pain in left toe(s): Secondary | ICD-10-CM | POA: Diagnosis not present

## 2023-08-12 NOTE — Progress Notes (Signed)
This patient returns to my office for at risk foot care.  This patient requires this care by a professional since this patient will be at risk due to having diabetes.   This patient is unable to cut nails herself since the patient cannot reach her nails.These nails are painful walking and wearing shoes.  This patient presents for at risk foot care today.   General Appearance  Alert, conversant and in no acute stress.  Vascular  Dorsalis pedis and posterior tibial  pulses are  weakly palpable  bilaterally.  Capillary return is within normal limits  bilaterally. Temperature is within normal limits  bilaterally.  Neurologic  Senn-Weinstein monofilament wire test absent   bilaterally. Muscle power within normal limits bilaterally.  Nails Thick disfigured discolored nails with subungual debris  from hallux to fifth toes bilaterally. No evidence of bacterial infection or drainage bilaterally. Healed left hallux.  Orthopedic  No limitations of motion  feet .  No crepitus or effusions noted.  No bony pathology or digital deformities noted.  Skin  normotropic skin noted bilaterally.  No signs of infections or ulcers noted.  Asymptomatic porokeratosis sub 5th met right foot.  Onychomycosis  Pain in right toes  Pain in left toes Porokeratosis right foot.  Consent was obtained for treatment procedures.   Mechanical debridement of nails 1-5  bilaterally performed with a nail nipper.  Filed with dremel without incident.     Return office visit 3 months                    Told patient to return for periodic foot care and evaluation due to potential at risk complications.   Gardiner Barefoot DPM

## 2023-08-13 ENCOUNTER — Encounter (HOSPITAL_BASED_OUTPATIENT_CLINIC_OR_DEPARTMENT_OTHER): Payer: Medicare PPO | Admitting: Internal Medicine

## 2023-08-13 DIAGNOSIS — L97822 Non-pressure chronic ulcer of other part of left lower leg with fat layer exposed: Secondary | ICD-10-CM | POA: Diagnosis not present

## 2023-08-13 DIAGNOSIS — I890 Lymphedema, not elsewhere classified: Secondary | ICD-10-CM

## 2023-08-13 DIAGNOSIS — I5042 Chronic combined systolic (congestive) and diastolic (congestive) heart failure: Secondary | ICD-10-CM | POA: Diagnosis not present

## 2023-08-13 DIAGNOSIS — E11622 Type 2 diabetes mellitus with other skin ulcer: Secondary | ICD-10-CM

## 2023-08-13 NOTE — Progress Notes (Signed)
ALTOVISE, MAHANA ([id number]) [id number] Page 1 of 7 Visit Report for 08/13/2023 Chief Complaint Document Details Patient Name: Date of Service: [name], [name] 08/13/2023 11:00 A M Medical Record Number: [id number] Patient Account Number: [id number] Date of Birth/Sex: Treating RN: [birth year] (89 y.o. F) Primary Care Provider: Alva [name] Other Clinician: Referring Provider: Treating Provider/Extender: [name] in Treatment: 6 Information Obtained from: Patient Chief Complaint 07/02/2023; bilateral lower extremity wounds Electronic Signature(s) Signed: 08/13/2023 1:53:07 PM By: Katherine Corwin DO Entered By: [name] on 08/13/2023 08:42:31 -------------------------------------------------------------------------------- HPI Details Patient Name: Date of Service: [name] 08/13/2023 11:00 A M Medical Record Number: [id number] Patient Account Number: [id number] Date of Birth/Sex: Treating RN: [birth year] (89 y.o. 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 She is not quite sure how the right upper leg wound started. The left lower extremity wound started out as blisters. She has compression stockings but has not been wearing them. She takes Lasix 20 mg daily. She currently denies signs of infection. In office ABIs were 0.57 on the left however dorsalis pedal pulses and posterior tibialis pulses were heard on Doppler. 8/6; patient presents for  follow-up. We have been using antibiotic ointment with Hydrofera Blue under Kerlix/Coban to the left lower extremity. Hydrofera Blue to the right lower leg. She came in for nurse visit and wrap change. She reports no issues. She reports that she is scheduled for her ABIs next week. 8/13; patient presents for follow-up. We have been using Hydrofera Blue and Santyl to the left lower extremity under Kerlix/Coban. She has been using Hydrofera Blue to the right upper leg wound. She had ABIs completed yesterday that showed an ABI of 0.91 on the left and 0.92 on the right. She has no issues or complaints today. 8/20; patient presents for follow-up. We have been using Hydrofera Blue and Santyl to the left lower extremity under 3 layer compression and Hydrofera Blue to the right upper leg wound. The right upper leg wound has healed. She feels that the wrap was too tight with the 3 layer. We will go back down to Kerlix/Coban. Other than that she has no issues or complaints. She denies signs of infection. Wounds Appear smaller. 8/27; patient presents for follow-up. We have been using Hydrofera Blue and Santyl to the left lower extremity wounds under Kerlix/Coban. She tolerated this well. She has no issues or complaints today. Wounds are smaller. 9/3; patient presents for follow-up. We have been using Hydrofera Blue and Santyl to the left lower extremity wound under Kerlix/Coban. Wound is smaller. 9/10; patient presents for follow-up. We have been using Hydrofera Blue and Santyl to the lower extremity under Kerlix/Coban. Wound appears smaller and well-healing. Electronic Signature(s) Signed: 08/13/2023 1:53:07 PM By: [name], [name] ([id number]) [id number] Page 2 of 7 Entered By: [name] on 08/13/2023 08:43:00 -------------------------------------------------------------------------------- Physical Exam Details Patient Name: Date of Service: [name], [name] 08/13/2023 11:00 A M Medical Record Number: [id number] Patient Account Number: [id number] Date of Birth/Sex: Treating RN: [name] (89 y.o. F) Primary Care Provider: Alva [name] Other Clinician: Referring Provider: Treating  Provider/Extender: [name] in Treatment: 6 Constitutional respirations regular, non-labored and within target range for patient.. Cardiovascular 2+ dorsalis pedis/posterior tibialis pulses. Psychiatric pleasant and cooperative. Notes T the anterior aspect there is an open wound with granulation tissue and fibrinous tissue. Good edema control. No surrounding signs of infection. o Electronic Signature(s) Signed: 08/13/2023 1:53:07 PM By: Katherine Corwin DO Entered By: [name] on 08/13/2023 08:43:37 -------------------------------------------------------------------------------- Physician Orders Details Patient Name: Date of Service: [name] 08/13/2023 11:00 A M Medical Record Number: [id number] Patient Account Number: [id number] Date of Birth/Sex: Treating RN: [birth year] (89 y.o. [name], [name] Primary Care Provider: Alva [name] Other Clinician: Referring Provider: Treating Provider/Extender: [name] in Treatment: 6 Verbal / Phone Orders: No Diagnosis Coding Follow-up Appointments ppointment in 1 week. - [name] Tuesday 08/20/23 @ 11:00 Rm # 8 (pt. already has appt.) Return A Anesthetic (In clinic) Topical Lidocaine 4% applied to wound bed Bathing/ Shower/ Hygiene May shower with protection but do not get wound dressing(s) wet. Protect dressing(s) with water repellant cover (for example, large plastic bag) or a cast cover and may then take shower. Edema Control - Lymphedema / SCD / Other Elevate legs to the level of the heart or above for 30 minutes daily and/or when sitting for 3-4 times a day throughout the day. Avoid standing for  long periods of time. Additional Orders / Instructions Follow Nutritious Diet - increase protein Juven Shake 1-2 times daily. Wound Treatment Wound #12 - Lower Leg Wound Laterality: Left, Anterior, Distal Cleanser: Soap and Water 1 x Per Week/30 Days [name] ([id number]) [id number].pdf Page 3 of 7 Discharge Instructions: May shower and wash wound with dial antibacterial soap and water prior to dressing change. Cleanser: Vashe 5.8 (oz) 1 x Per Week/30 Days Discharge Instructions: Cleanse the wound with Vashe prior to applying a clean dressing using gauze sponges, not tissue or cotton balls. Peri-Wound Care: Sween Lotion (Moisturizing lotion) 1 x Per Week/30 Days Discharge Instructions: Apply moisturizing lotion as directed Prim Dressing: Hydrofera Blue Ready Transfer Foam, 2.5x2.5 (in/in) 1 x Per Week/30 Days ary Discharge Instructions: Apply directly to wound bed as directed Prim Dressing: Santyl Ointment 1 x Per Week/30 Days ary Discharge Instructions: Apply nickel thick amount to wound bed as instructed Secondary Dressing: ABD Pad, 8x10 1 x Per Week/30 Days Discharge Instructions: Apply over primary dressing as directed. Secondary Dressing: Woven Gauze Sponge, Non-Sterile 4x4 in 1 x Per Week/30 Days Discharge Instructions: Apply over primary dressing as directed. Compression Wrap: Kerlix Roll 4.5x3.1 (in/yd) 1 x Per Week/30 Days Discharge Instructions: Apply Kerlix and Coban compression as directed. Compression Wrap: Coban Self-Adherent Wrap 4x5 (in/yd) 1 x Per Week/30 Days Discharge Instructions: Apply over Kerlix as directed. Electronic Signature(s) Signed: 08/13/2023 1:53:07 PM By: Katherine Corwin DO Entered By: [name] on 08/13/2023 08:43:52 -------------------------------------------------------------------------------- Problem List Details Patient Name: Date of Service: [name] 08/13/2023 11:00 A M Medical Record Number:  [id number] Patient Account Number: [id number] Date of Birth/Sex: Treating RN: [name] (89 y.o. F) Primary Care Provider: Alva [name] Other Clinician: Referring Provider: Treating Provider/Extender: [name] in Treatment: 6 Active Problems ICD-10 Encounter Code Description Active Date MDM Diagnosis L97.812 Non-pressure chronic ulcer of other part of right lower leg with fat layer 07/02/2023 No Yes exposed L97.822 Non-pressure chronic ulcer of other part of left lower leg with fat layer exposed7/30/2024 No Yes I89.0 Lymphedema, not elsewhere classified 07/02/2023 No Yes  V42.595 Type 2 diabetes mellitus with other skin ulcer 07/02/2023 No Yes I50.42 Chronic combined systolic (congestive) and diastolic (congestive) heart failure 07/02/2023 No Yes [name], [name] ([id number]) [phone].pdf Page 4 of 7 Inactive Problems Resolved Problems Electronic Signature(s) Signed: 08/13/2023 1:53:07 PM By: Katherine Corwin DO Entered By: [name] on 08/13/2023 08:42:15 -------------------------------------------------------------------------------- Progress Note Details Patient Name: Date of Service: [name] 08/13/2023 11:00 A M Medical Record Number: [id number] Patient Account Number: [id number] Date of Birth/Sex: Treating RN: [birth year][name] (89 y.o. F) Primary Care Provider: Alva [name] Other Clinician: Referring Provider: Treating Provider/Extender: [name] in Treatment: 6 Subjective Chief Complaint Information obtained from Patient 07/02/2023; bilateral lower extremity wounds History of Present Illness (HPI) 07/02/2023 Ms. 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 She is not quite sure how the right upper leg wound started. The left lower extremity wound started out as blisters. She has compression stockings but has not been wearing them. She takes Lasix 20 mg daily. She currently denies signs of infection. In office ABIs were 0.57 on the left however dorsalis pedal pulses and posterior tibialis pulses were heard on Doppler. 8/6; patient presents for follow-up. We have been using antibiotic ointment with Hydrofera Blue under Kerlix/Coban to the left lower extremity. Hydrofera Blue to the right lower leg. She came in for nurse visit and wrap change. She reports no issues. She reports that she is scheduled for her ABIs next week. 8/13; patient presents for follow-up. We have been using Hydrofera Blue and Santyl to the left lower extremity under Kerlix/Coban. She has been using Hydrofera Blue to the right upper leg wound. She had ABIs completed yesterday that showed an ABI of 0.91 on the left and 0.92 on the right. She has no issues or complaints today. 8/20; patient presents for follow-up. We have been using Hydrofera Blue and Santyl to the left lower extremity under 3 layer compression and Hydrofera Blue to the right upper leg wound. The right upper leg wound has healed. She feels that the wrap was too tight with the 3 layer. We will go back down to Kerlix/Coban. Other than that she has no issues or complaints. She denies signs of infection. Wounds Appear smaller. 8/27; patient presents for follow-up. We have been using Hydrofera Blue and Santyl to the left lower extremity wounds under Kerlix/Coban. She tolerated this well. She has no issues or complaints today. Wounds are smaller. 9/3; patient presents for follow-up. We have been using Hydrofera Blue and Santyl to the left lower extremity wound under Kerlix/Coban. Wound is smaller. 9/10; patient presents for follow-up. We have been using Hydrofera Blue and Santyl to the lower  extremity under Kerlix/Coban. Wound appears smaller and well-healing. Patient History Medical History Hematologic/Lymphatic Patient has history of Anemia Cardiovascular Patient has history of Congestive Heart Failure, Hypertension, Peripheral Venous Disease Endocrine Patient has history of Type II Diabetes Musculoskeletal Patient has history of Osteoarthritis Neurologic Patient has history of Neuropathy Medical A Surgical History Notes nd Cardiovascular hyperlipidemia, venous stasis Gastrointestinal GERD Psychiatric generalized anxiety [name], [name] ([id number]) [id number] Page 5 of 7 Objective Constitutional respirations regular, non-labored and within target range for patient.. Vitals Time Taken: 11:10 AM, Height: 62 in, Weight: 220 lbs, BMI: 40.2, Temperature: 98 F, Pulse: 55 bpm, Respiratory Rate:  18 breaths/min, Blood Pressure: 133/70 mmHg. Cardiovascular 2+ dorsalis pedis/posterior tibialis pulses. Psychiatric pleasant and cooperative. General Notes: T the anterior aspect there is an open wound with granulation tissue and fibrinous tissue. Good edema control. No surrounding signs of o infection. Integumentary (Hair, Skin) Wound #12 status is Open. Original cause of wound was Blister. The date acquired was: 06/12/2023. The wound has been in treatment 6 [name]. The wound is located on the [facility] Lower Leg. The wound measures 1.3cm length x 0.6cm width x 0.1cm depth; 0.613cm^2 area and 0.061cm^3 volume. There is no tunneling or undermining noted. There is a medium amount of serosanguineous drainage noted. The wound margin is distinct with the outline attached to the wound base. There is medium (34-66%) pink, pale granulation within the wound bed. There is a medium (34-66%) amount of necrotic tissue within the wound bed including Adherent Slough. The periwound skin appearance exhibited: Scarring, Hemosiderin Staining. The periwound  skin appearance did not exhibit: Callus, Crepitus, Excoriation, Induration, Rash, Dry/Scaly, Maceration, Atrophie Blanche, Cyanosis, Ecchymosis, Mottled, Pallor, Rubor, Erythema. Periwound temperature was noted as No Abnormality. Assessment Active Problems ICD-10 Non-pressure chronic ulcer of other part of right lower leg with fat layer exposed Non-pressure chronic ulcer of other part of left lower leg with fat layer exposed Lymphedema, not elsewhere classified Type 2 diabetes mellitus with other skin ulcer Chronic combined systolic (congestive) and diastolic (congestive) heart failure Patient's wound appears well-healing. I recommended continuing the course with Santyl and Hydrofera Blue under Kerlix/Coban. Follow-up in 1 week. Of note she wanted to be able to go out for her birthday without the wrap on. She will take the wrap off on Saturday and dress the wound and use Tubigrip and we will see her 2 days later on Monday to have the wrap replaced. Plan Follow-up Appointments: Return Appointment in 1 week. - [name] Tuesday 08/20/23 @ 11:00 Rm # 8 (pt. already has appt.) Anesthetic: (In clinic) Topical Lidocaine 4% applied to wound bed Bathing/ Shower/ Hygiene: May shower with protection but do not get wound dressing(s) wet. Protect dressing(s) with water repellant cover (for example, large plastic bag) or a cast cover and may then take shower. Edema Control - Lymphedema / SCD / Other: Elevate legs to the level of the heart or above for 30 minutes daily and/or when sitting for 3-4 times a day throughout the day. Avoid standing for long periods of time. Additional Orders / Instructions: Follow Nutritious Diet - increase protein Juven Shake 1-2 times daily. WOUND #12: - Lower Leg Wound Laterality: Left, Anterior, Distal Cleanser: Soap and Water 1 x Per Week/30 Days Discharge Instructions: May shower and wash wound with dial antibacterial soap and water prior to dressing  change. Cleanser: Vashe 5.8 (oz) 1 x Per Week/30 Days Discharge Instructions: Cleanse the wound with Vashe prior to applying a clean dressing using gauze sponges, not tissue or cotton balls. Peri-Wound Care: Sween Lotion (Moisturizing lotion) 1 x Per Week/30 Days Discharge Instructions: Apply moisturizing lotion as directed Prim Dressing: Hydrofera Blue Ready Transfer Foam, 2.5x2.5 (in/in) 1 x Per Week/30 Days ary Discharge Instructions: Apply directly to wound bed as directed Prim Dressing: Santyl Ointment 1 x Per Week/30 Days ary Discharge Instructions: Apply nickel thick amount to wound bed as instructed Secondary Dressing: ABD Pad, 8x10 1 x Per Week/30 Days Discharge Instructions: Apply over primary dressing as directed. Secondary Dressing: Woven Gauze Sponge, Non-Sterile 4x4 in 1 x Per Week/30 Days [name], [name] ([id number]) [phone].pdf Page 6 of 7 Discharge  Instructions: Apply over primary dressing as directed. Compression Wrap: Kerlix Roll 4.5x3.1 (in/yd) 1 x Per Week/30 Days Discharge Instructions: Apply Kerlix and Coban compression as directed. Compression Wrap: Coban Self-Adherent Wrap 4x5 (in/yd) 1 x Per Week/30 Days Discharge Instructions: Apply over Kerlix as directed. 1. Santyl and Hydrofera Blue under Kerlix/Cobanleft lower extremity 2. Follow-up in 1 week Electronic Signature(s) Signed: 08/13/2023 1:53:07 PM By: Katherine Corwin DO Entered By: [name] on 08/13/2023 08:48:26 -------------------------------------------------------------------------------- HxROS Details Patient Name: Date of Service: [name] 08/13/2023 11:00 A M Medical Record Number: [id number] Patient Account Number: [id number] Date of Birth/Sex: Treating RN: [name][birth year] (89 y.o. F) Primary Care Provider: Alva [name] Other Clinician: Referring Provider: Treating Provider/Extender: [name] in Treatment:  6 Hematologic/Lymphatic Medical History: Positive for: Anemia Cardiovascular Medical History: Positive for: Congestive Heart Failure; Hypertension; Peripheral Venous Disease Past Medical History Notes: hyperlipidemia, venous stasis Gastrointestinal Medical History: Past Medical History Notes: GERD Endocrine Medical History: Positive for: Type II Diabetes Musculoskeletal Medical History: Positive for: Osteoarthritis Neurologic Medical History: Positive for: Neuropathy Psychiatric Medical History: Past Medical History Notes: generalized anxiety Immunizations Pneumococcal Vaccine: Received Pneumococcal Vaccination: Yes Received Pneumococcal Vaccination On or After [address], [name] ([id number]) [phone].pdf Page 7 of 7 Implantable Devices No devices added Electronic Signature(s) Signed: 08/13/2023 1:53:07 PM By: Katherine Corwin DO Entered By: [name] on 08/13/2023 08:43:07 -------------------------------------------------------------------------------- SuperBill Details Patient Name: Date of Service: [name] 08/13/2023 Medical Record Number: [id number] Patient Account Number: [id number] Date of Birth/Sex: Treating RN: [birth year] (89 y.o. F) Primary Care Provider: Alva [name] Other Clinician: Referring Provider: Treating Provider/Extender: [name] in Treatment: 6 Diagnosis Coding ICD-10 Codes Code Description [phone] Non-pressure chronic ulcer of other part of right lower leg with fat layer exposed L97.822 Non-pressure chronic ulcer of other part of left lower leg with fat layer exposed I89.0 Lymphedema, not elsewhere classified E11.622 Type 2 diabetes mellitus with other skin ulcer I50.42 Chronic combined systolic (congestive) and diastolic (congestive) heart failure Physician Procedures : CPT4 Code Description Modifier [id number] - WC PHYS LEVEL 3 - EST  PT ICD-10 Diagnosis Description L97.822 Non-pressure chronic ulcer of other part of left lower leg with fat layer exposed I89.0 Lymphedema, not elsewhere classified E11.622 Type 2  diabetes mellitus with other skin ulcer I50.42 Chronic combined systolic (congestive) and diastolic (congestive) heart failure Quantity: 1 Electronic Signature(s) Signed: 08/13/2023 1:53:07 PM By: Katherine Corwin DO Entered By: [name] on 08/13/2023 75:64:33

## 2023-08-14 NOTE — Progress Notes (Addendum)
Katherine, [name] ([id number]) [id number] Page 1 of 8 Visit Report for 08/13/2023 Arrival Information Details Patient Name: Date of Service: [name], [name] 08/13/2023 11:00 A M Medical Record Number: [id number] Patient Account Number: [id number] Date of Birth/Sex: Treating RN: 06/16/1931 (89 y.o. F) Primary Care Parthenia Tellefsen: [name] Other Clinician: Referring Zymire Turnbo: Treating Demont Linford/Extender: Loetta Rough in Treatment: 6 Visit Information History Since Last Visit Added or deleted any medications: No Patient Arrived: [name] Any new allergies or adverse reactions: No Arrival Time: 11:00 Had a fall or experienced change in No Accompanied By: friend activities of daily living that may affect Transfer Assistance: None risk of falls: Patient Identification Verified: Yes Signs or symptoms of abuse/neglect since last visito No Secondary Verification Process Completed: Yes Hospitalized since last visit: No Patient Requires Transmission-Based Precautions: No Implantable device outside of the clinic excluding No Patient Has Alerts: Yes cellular tissue based products placed in the center Patient Alerts: ABIs: R:0.92 L:0.91 8/24 since last visit: Has Dressing in Place as Prescribed: Yes Has Compression in Place as Prescribed: Yes Pain Present Now: Yes Electronic Signature(s) Signed: 08/14/2023 4:43:35 PM By: Thayer Dallas Entered By: Thayer Dallas on 08/13/2023 08:10:01 -------------------------------------------------------------------------------- Clinic Level of Care Assessment Details Patient Name: Date of Service: Katherine, [name] 08/13/2023 11:00 A M Medical Record Number: [id number] Patient Account Number: [id number] Date of Birth/Sex: Treating RN: [birth year] (89 y.o. F) Primary Care Trichelle Lehan: [name] Other Clinician: Referring Evalisse Prajapati: Treating Esabella Stockinger/Extender: Loetta Rough in Treatment: 6 Clinic Level of Care Assessment Items TOOL 4 Quantity Score []  - 0 Use when only an EandM is performed on FOLLOW-UP visit ASSESSMENTS - Nursing Assessment / Reassessment X- 1 10 Reassessment of Co-morbidities (includes updates in patient status) X- 1 5 Reassessment of Adherence to Treatment Plan ASSESSMENTS - Wound and Skin A ssessment / Reassessment X - Simple Wound Assessment / Reassessment - one wound 1 5 []  - 0 Complex Wound Assessment / Reassessment - multiple wounds []  - 0 Dermatologic / Skin Assessment (not related to wound area) ASSESSMENTS - Focused Assessment []  - 0 Circumferential Edema Measurements - multi extremities []  - 0 Nutritional Assessment / Counseling / Intervention Katherine, [name] ([id number]) [id number] Page 2 of 8 []  - 0 Lower Extremity Assessment (monofilament, tuning fork, pulses) []  - 0 Peripheral Arterial Disease Assessment (using hand held doppler) ASSESSMENTS - Ostomy and/or Continence Assessment and Care []  - 0 Incontinence Assessment and Management []  - 0 Ostomy Care Assessment and Management (repouching, etc.) PROCESS - Coordination of Care X - Simple Patient / Family Education for ongoing care 1 15 []  - 0 Complex (extensive) Patient / Family Education for ongoing care X- 1 10 Staff obtains Chiropractor, Records, T Results / Process Orders est []  - 0 Staff telephones HHA, Nursing Homes / Clarify orders / etc []  - 0 Routine Transfer to another Facility (non-emergent condition) []  - 0 Routine Hospital Admission (non-emergent condition) []  - 0 New Admissions / Manufacturing engineer / Ordering NPWT Apligraf, etc. , []  - 0 Emergency Hospital Admission (emergent condition) []  - 0 Simple Discharge Coordination []  - 0 Complex (extensive) Discharge Coordination PROCESS - Special Needs []  - 0 Pediatric / Minor Patient Management []  - 0 Isolation Patient Management []  -  0 Hearing / Language / Visual special needs []  - 0 Assessment of Community assistance (transportation, D/C planning, etc.) []  - 0 Additional assistance / Altered mentation []  - 0 Support Surface(s) Assessment (bed, cushion, seat, etc.)  INTERVENTIONS - Wound Cleansing / Measurement X - Simple Wound Cleansing - one wound 1 5 []  - 0 Complex Wound Cleansing - multiple wounds X- 1 5 Wound Imaging (photographs - any number of wounds) []  - 0 Wound Tracing (instead of photographs) X- 1 5 Simple Wound Measurement - one wound []  - 0 Complex Wound Measurement - multiple wounds INTERVENTIONS - Wound Dressings X - Small Wound Dressing one or multiple wounds 1 10 []  - 0 Medium Wound Dressing one or multiple wounds []  - 0 Large Wound Dressing one or multiple wounds []  - 0 Application of Medications - topical []  - 0 Application of Medications - injection INTERVENTIONS - Miscellaneous []  - 0 External ear exam []  - 0 Specimen Collection (cultures, biopsies, blood, body fluids, etc.) []  - 0 Specimen(s) / Culture(s) sent or taken to Lab for analysis []  - 0 Patient Transfer (multiple staff / Nurse, adult / Similar devices) []  - 0 Simple Staple / Suture removal (25 or less) []  - 0 Complex Staple / Suture removal (26 or more) []  - 0 Hypo / Hyperglycemic Management (close monitor of Blood Glucose) Katherine, MINYARD [name] ([id number]) [id number] Page 3 of 8 []  - 0 Ankle / Brachial Index (ABI) - do not check if billed separately X- 1 5 Vital Signs Has the patient been seen at the hospital within the last three years: Yes Total Score: 75 Level Of Care: New/Established - Level 2 Electronic Signature(s) Signed: 10/02/2023 3:29:29 PM By: Pearletha Alfred Entered By: Pearletha Alfred on 09/03/2023 05:45:38 -------------------------------------------------------------------------------- Lower Extremity Assessment Details Patient Name: Date of Service: Katherine, [name] 08/13/2023  11:00 A M Medical Record Number: [id number] Patient Account Number: [id number] Date of Birth/Sex: Treating RN: 1931-08-01 (89 y.o. F) Primary Care Willam Munford: [name] Other Clinician: Referring Rachna Schonberger: Treating Billee Balcerzak/Extender: Loetta Rough in Treatment: 6 Edema Assessment Assessed: [Left: No] [Right: No] Edema: [Left: Ye] [Right: s] Calf Left: Right: Point of Measurement: 32 cm From Medial Instep 44.5 cm Ankle Left: Right: Point of Measurement: 11 cm From Medial Instep 21.6 cm Vascular Assessment Extremity colors, hair growth, and conditions: Extremity Color: [Left:Normal] Hair Growth on Extremity: [Left:Yes] Temperature of Extremity: [Left:Hot] Capillary Refill: [Left:< 3 seconds] Dependent Rubor: [Left:No No] Toe Nail Assessment Left: Right: Thick: Yes Discolored: Yes Deformed: No Improper Length and Hygiene: Yes Electronic Signature(s) Signed: 08/14/2023 4:43:35 PM By: Thayer Dallas Entered By: Thayer Dallas on 08/13/2023 08:11:13 Multi Wound Chart Details -------------------------------------------------------------------------------- [name] ([id number]) [id number] Page 4 of 8 Patient Name: Date of Service: KULSUM, ROFFMAN 08/13/2023 11:00 A M Medical Record Number: [id number] Patient Account Number: [id number] Date of Birth/Sex: Treating RN: 15-Jun-1931 (89 y.o. F) Primary Care Vara Mairena: [name] Other Clinician: Referring Anjela Cassara: Treating Orest Dygert/Extender: Loetta Rough in Treatment: 6 Vital Signs Height(in): 62 Pulse(bpm): 55 Weight(lbs): 220 Blood Pressure(mmHg): 133/70 Body Mass Index(BMI): 40.2 Temperature(F): 98 Respiratory Rate(breaths/min): 18 Wound Assessments Wound Number: 12 N/A N/A Photos: N/A N/A Left, Distal, Anterior Lower Leg N/A N/A Wound Location: Blister N/A N/A Wounding Event: Diabetic Wound/Ulcer of the Lower  N/A N/A Primary Etiology: Extremity Lymphedema N/A N/A Secondary Etiology: Anemia, Congestive Heart Failure, N/A N/A Comorbid History: Hypertension, Peripheral Venous Disease, Type II Diabetes, Osteoarthritis, Neuropathy 06/12/2023 N/A N/A Date Acquired: 6 N/A N/A Weeks of Treatment: Open N/A N/A Wound Status: No N/A N/A Wound Recurrence: 1.3x0.6x0.1 N/A N/A Measurements L x W x D (cm) 0.613 N/A N/A A (cm) : rea 0.061 N/A N/A Volume (  cm) : 83.80% N/A N/A % Reduction in A rea: 83.90% N/A N/A % Reduction in Volume: Grade 1 N/A N/A Classification: Medium N/A N/A Exudate A mount: Serosanguineous N/A N/A Exudate Type: red, brown N/A N/A Exudate Color: Distinct, outline attached N/A N/A Wound Margin: Medium (34-66%) N/A N/A Granulation A mount: Pink, Pale N/A N/A Granulation Quality: Medium (34-66%) N/A N/A Necrotic A mount: Fascia: No N/A N/A Exposed Structures: Fat Layer (Subcutaneous Tissue): No Tendon: No Muscle: No Joint: No Bone: No Small (1-33%) N/A N/A Epithelialization: Scarring: Yes N/A N/A Periwound Skin Texture: Excoriation: No Induration: No Callus: No Crepitus: No Rash: No Maceration: No N/A N/A Periwound Skin Moisture: Dry/Scaly: No Hemosiderin Staining: Yes N/A N/A Periwound Skin Color: Atrophie Blanche: No Cyanosis: No Ecchymosis: No Erythema: No Mottled: No Pallor: No Rubor: No No Abnormality N/A N/A Temperature: Treatment Notes Electronic Signature(s) Signed: 08/13/2023 1:53:07 PM By: Lafonda Mosses, Signed: 08/13/2023 1:53:07 PM By: Carroll Kinds ([id number]) [id number] Page 5 of 8 Entered By: Geralyn Corwin on 08/13/2023 08:42:23 -------------------------------------------------------------------------------- Multi-Disciplinary Care Plan Details Patient Name: Date of Service: LIDIE, JARAMILLO 08/13/2023 11:00 A M Medical Record Number: [id number] Patient Account  Number: [id number] Date of Birth/Sex: Treating RN: 08-Jun-1931 (89 y.o. [name], [name] Primary Care Roshawnda Pecora: [name] Other Clinician: Referring Si Jachim: Treating Anarosa Kubisiak/Extender: Loetta Rough in Treatment: 6 Active Inactive Nutrition Nursing Diagnoses: Potential for alteratiion in Nutrition/Potential for imbalanced nutrition Goals: Patient/caregiver agrees to and verbalizes understanding of need to obtain nutritional consultation Date Initiated: 07/02/2023 Date Inactivated: 07/30/2023 Target Resolution Date: 08/02/2023 Goal Status: Met Patient/caregiver will maintain therapeutic glucose control Date Initiated: 07/02/2023 Target Resolution Date: 08/02/2023 Goal Status: Active Interventions: Assess HgA1c results as ordered upon admission and as needed Provide education on elevated blood sugars and impact on wound healing Provide education on nutrition Treatment Activities: Patient referred to Primary Care Physician for further nutritional evaluation : 07/02/2023 Notes: Pain, Acute or Chronic Nursing Diagnoses: Pain, acute or chronic: actual or potential Potential alteration in comfort, pain Goals: Patient will verbalize adequate pain control and receive pain control interventions during procedures as needed Date Initiated: 07/02/2023 Date Inactivated: 07/30/2023 Target Resolution Date: 08/02/2023 Goal Status: Met Patient/caregiver will verbalize comfort level met Date Initiated: 07/02/2023 Target Resolution Date: 08/02/2023 Goal Status: Active Interventions: Encourage patient to take pain medications as prescribed Provide education on pain management Reposition patient for comfort Treatment Activities: Administer pain control measures as ordered : 07/02/2023 Notes: Wound/Skin Impairment Nursing Diagnoses: Knowledge deficit related to ulceration/compromised skin integrity Goals: Patient/caregiver will verbalize understanding of  skin care regimen QUITA, BOTTICELLI ([id number]) [id number] Page 6 of 8 Date Initiated: 07/02/2023 Target Resolution Date: 08/02/2023 Goal Status: Active Interventions: Assess patient/caregiver ability to perform ulcer/skin care regimen upon admission and as needed Assess ulceration(s) every visit Provide education on ulcer and skin care Treatment Activities: Skin care regimen initiated : 07/02/2023 Topical wound management initiated : 07/02/2023 Notes: Electronic Signature(s) Signed: 08/13/2023 3:48:55 PM By: [name] RN Entered By: [name] on 08/13/2023 08:32:02 -------------------------------------------------------------------------------- Pain Assessment Details Patient Name: Date of Service: [name] 08/13/2023 11:00 A M Medical Record Number: [id number] Patient Account Number: [id number] Date of Birth/Sex: Treating RN: 10-Jun-1931 (89 y.o. F) Primary Care Chan Rosasco: [name] Other Clinician: Referring Bellany Elbaum: Treating Sofiya Ezelle/Extender: Loetta Rough in Treatment: 6 Active Problems Location of Pain Severity and Description of Pain Patient Has Paino Yes Site Locations Pain Location: Pain in Ulcers Rate the  pain. Current Pain Level: 6 Pain Management and Medication Current Pain Management: Electronic Signature(s) Signed: 08/14/2023 4:43:35 PM By: Thayer Dallas Entered By: Thayer Dallas on 08/13/2023 08:10:41 [name] ([id number]) [id number] Page 7 of 8 -------------------------------------------------------------------------------- Wound Assessment Details Patient Name: Date of Service: SPIRITUAL, BACINO 08/13/2023 11:00 A M Medical Record Number: [id number] Patient Account Number: [id number] Date of Birth/Sex: Treating RN: 03-20-1931 (89 y.o. F) Primary Care Carmen Tolliver: [name] Other Clinician: Referring Frankee Gritz: Treating  Tama Grosz/Extender: [name]: 6 Wound Status Wound Number: 12 Primary Diabetic Wound/Ulcer of the Lower Extremity Etiology: Wound Location: Left, Distal, Anterior Lower Leg Secondary Lymphedema Wounding Event: Blister Etiology: Date Acquired: 06/12/2023 Wound Open Weeks Of Treatment: 6 Status: Clustered Wound: No Comorbid Anemia, Congestive Heart Failure, Hypertension, Peripheral History: Venous Disease, Type II Diabetes, Osteoarthritis, Neuropathy Photos Wound Measurements Length: (cm) 1.3 Width: (cm) 0.6 Depth: (cm) 0.1 Area: (cm) 0.613 Volume: (cm) 0.061 % Reduction in Area: 83.8% % Reduction in Volume: 83.9% Epithelialization: Small (1-33%) Tunneling: No Undermining: No Wound Description Classification: Grade 1 Wound Margin: Distinct, outline attached Exudate Amount: Medium Exudate Type: Serosanguineous Exudate Color: red, brown Foul Odor After Cleansing: No Slough/Fibrino Yes Wound Bed Granulation Amount: Medium (34-66%) Exposed Structure Granulation Quality: Pink, Pale Fascia Exposed: No Necrotic Amount: Medium (34-66%) Fat Layer (Subcutaneous Tissue) Exposed: No Necrotic Quality: Adherent Slough Tendon Exposed: No Muscle Exposed: No Joint Exposed: No Bone Exposed: No Periwound Skin Texture Texture Color No Abnormalities Noted: No No Abnormalities Noted: No Callus: No Atrophie Blanche: No Crepitus: No Cyanosis: No Excoriation: No Ecchymosis: No Induration: No Erythema: No Rash: No Hemosiderin Staining: Yes Scarring: Yes Mottled: No Pallor: No Moisture Rubor: No No Abnormalities Noted: No Dry / Scaly: No Temperature / Pain Maceration: No Temperature: No Abnormality Electronic Signature(s) Signed: 08/14/2023 4:43:35 PM By: Thayer Dallas Entered By: Thayer Dallas on 08/13/2023 08:15:00 [name] ([id number]) [id number] Page 8 of  8 -------------------------------------------------------------------------------- Vitals Details Patient Name: Date of Service: ZUHRA, LINGLEY 08/13/2023 11:00 A M Medical Record Number: [id number] Patient Account Number: [id number] Date of Birth/Sex: Treating RN: [birth year] (89 y.o. F) Primary Care Tyshawn Keel: [name] Other Clinician: Referring Scott Fix: Treating Shaka Cardin/Extender: Loetta Rough in Treatment: 6 Vital Signs Time Taken: 11:10 Temperature (F): 98 Height (in): 62 Pulse (bpm): 55 Weight (lbs): 220 Respiratory Rate (breaths/min): 18 Body Mass Index (BMI): 40.2 Blood Pressure (mmHg): 133/70 Reference Range: 80 - 120 mg / dl Electronic Signature(s) Signed: 08/14/2023 4:43:35 PM By: Thayer Dallas Entered By: Thayer Dallas on 08/13/2023 08:10:27

## 2023-08-14 NOTE — Progress Notes (Signed)
CHERYN, [name] ([id number]) [id number] Page 1 of 9 Visit Report for 08/06/2023 Arrival Information Details Patient Name: Date of Service: PECOLIA, [name] 08/06/2023 11:00 A M Medical Record Number: [id number] Patient Account Number: [id number] Date of Birth/Sex: Treating RN: [birth year] (89 y.o. F) Primary Care [place] Zaragoza: [name] Other Clinician: Referring Caeleigh Prohaska: Treating Raziya Aveni/Extender: Loetta Rough in Treatment: 5 Visit Information History Since Last Visit Added or deleted any medications: No Patient Arrived: Ambulatory Any new allergies or adverse reactions: No Arrival Time: 11:06 Had a fall or experienced change in No Accompanied By: friend activities of daily living that may affect Transfer Assistance: None risk of falls: Patient Identification Verified: Yes Signs or symptoms of abuse/neglect since last visito No Secondary Verification Process Completed: Yes Hospitalized since last visit: No Patient Requires Transmission-Based Precautions: No Implantable device outside of the clinic excluding No Patient Has Alerts: Yes cellular tissue based products placed in the center Patient Alerts: ABIs: R:0.92 L:0.91 8/24 since last visit: Has Dressing in Place as Prescribed: Yes Has Compression in Place as Prescribed: Yes Pain Present Now: No Electronic Signature(s) Signed: 08/06/2023 5:00:52 PM By: Thayer Dallas Entered By: Thayer Dallas on 08/06/2023 11:12:41 -------------------------------------------------------------------------------- Encounter Discharge Information Details Patient Name: Date of Service: [name] 08/06/2023 11:00 A M Medical Record Number: [id number] Patient Account Number: [id number] Date of Birth/Sex: Treating RN: 08/30/1931 (89 y.o. [name] Primary Care Makael Stein: [name] Other Clinician: Referring Esau Fridman: Treating Marygrace Sandoval/Extender: Loetta Rough in Treatment: 5 Encounter Discharge Information Items Post Procedure Vitals Discharge Condition: Stable Temperature (F): 98.6 Ambulatory Status: Walker Pulse (bpm): 78 Discharge Destination: Home Respiratory Rate (breaths/min): 18 Transportation: Private Auto Blood Pressure (mmHg): 150/68 Accompanied By: daughter Schedule Follow-up Appointment: Yes Clinical Summary of Care: Patient Declined Electronic Signature(s) Signed: 08/14/2023 7:59:39 AM By: Brenton Grills Entered By: Brenton Grills on 08/06/2023 12:00:51 [name] ([id number]) [id number] Page 2 of 9 -------------------------------------------------------------------------------- Lower Extremity Assessment Details Patient Name: Date of Service: Katherine, [name] 08/06/2023 11:00 A M Medical Record Number: [id number] Patient Account Number: [id number] Date of Birth/Sex: Treating RN: [birth year] (89 y.o. F) Primary Care Siah Kannan: [name] Other Clinician: Referring Anastasia Tompson: Treating Sona Nations/Extender: Loetta Rough in Treatment: 5 Edema Assessment Assessed: [Left: No] [Right: No] Edema: [Left: Ye] [Right: s] Calf Left: Right: Point of Measurement: 32 cm From Medial Instep 44.4 cm Ankle Left: Right: Point of Measurement: 11 cm From Medial Instep 22.2 cm Vascular Assessment Extremity colors, hair growth, and conditions: Extremity Color: [Left:Normal] Hair Growth on Extremity: [Left:Yes] Temperature of Extremity: [Left:Hot] Capillary Refill: [Left:< 3 seconds] Dependent Rubor: [Left:No No] Electronic Signature(s) Signed: 08/06/2023 5:00:52 PM By: Thayer Dallas Entered By: Thayer Dallas on 08/06/2023 11:16:54 -------------------------------------------------------------------------------- Multi Wound Chart Details Patient Name: Date of Service: [name] 08/06/2023 11:00 A M Medical Record Number:  [id number] Patient Account Number: [id number] Date of Birth/Sex: Treating RN: 1931/05/15 (89 y.o. F) Primary Care Adel Neyer: [name] Other Clinician: Referring Joshau Code: Treating Tiffney Haughton/Extender: Loetta Rough in Treatment: 5 Vital Signs Height(in): 62 Pulse(bpm): 56 Weight(lbs): 220 Blood Pressure(mmHg): 138/63 Body Mass Index(BMI): 40.2 Temperature(F): 97.6 Respiratory Rate(breaths/min): 18 [11:Photos: No Photos Left, Proximal Lower Leg Wound Location: Bite Wounding Event: Diabetic Wound/Ulcer of the Lower Primary Etiology: Extremity] [12:No Photos Left, Distal, Anterior Lower Leg Blister Diabetic Wound/Ulcer of the Lower Extremity] [N/A:N/A N/A N/A  N/A] Katherine, [name] ([id number]) [11:Lymphedema Secondary Etiology: Anemia, Congestive Heart Failure, Comorbid  History: Hypertension, Peripheral Venous Disease, Type II Diabetes, Osteoarthritis, Neuropathy 06/12/2023 Date Acquired: 5 Weeks of Treatment: Healed - Epithelialized Wound  Status: No Wound Recurrence: Yes Clustered Wound: 1 Clustered Quantity: 0x0x0 Measurements L x W x D (cm) 0 A (cm) : rea 0 Volume (cm) : 100.00% % Reduction in A rea: 100.00% % Reduction in Volume: Grade 1 Classification: Medium Exudate A mount:  Serosanguineous Exudate Type: red, brown Exudate Color: Distinct, outline attached Wound Margin: None Present (0%) Granulation A mount: N/A Granulation Quality: None Present (0%) Necrotic A mount: Fascia: No Exposed Structures: Fat Layer (Subcutaneous  Tissue): No Tendon: No Muscle: No Joint: No Bone: No Large (67-100%) Epithelialization: N/A Debridement: Pre-procedure Verification/Time Out N/A Taken: N/A Pain Control: N/A Tissue Debrided: N/A Level: N/A Debridement A (sq cm): rea N/A Instrument: N/A  Bleeding: N/A Hemostasis A chieved: N/A Procedural Pain: N/A Post Procedural Pain: N/A Debridement Treatment Response: N/A Post Debridement Measurements L x W x D (cm) N/A Post  Debridement Volume: (cm) Scarring: Yes Periwound Skin Texture: Excoriation:  No Induration: No Callus: No Crepitus: No Rash: No Maceration: No Periwound Skin Moisture: Dry/Scaly: No Hemosiderin Staining: Yes Periwound Skin Color: Atrophie Blanche: No Cyanosis: No Ecchymosis: No Erythema: No Mottled: No Pallor: No Rubor: No No  Abnormality Temperature: N/A Procedures Performed:] [12:Lymphedema Anemia, Congestive Heart Failure, Hypertension, Peripheral Venous Disease, Type II Diabetes, Osteoarthritis, Neuropathy 06/12/2023 5 Open No No N/A 1x0.5x0.1 0.393 0.039 89.60% 89.70%  Grade 1 Medium Serosanguineous red, brown Distinct, outline attached Medium (34-66%) Pink, Pale Medium (34-66%) Fascia: No Fat Layer (Subcutaneous Tissue): No Tendon: No Muscle: No Joint: No Bone: No Small (1-33%) Debridement - Selective/Open Wound 11:45  Lidocaine 4% Topical Solution Slough Non-Viable Tissue 0.39 Curette Minimum Pressure 0 0 Procedure was tolerated well 1x0.5x0.1 0.039 Scarring: Yes Excoriation: No Induration: No Callus: No Crepitus: No Rash: No Maceration: No Dry/Scaly: No Hemosiderin  Staining: Yes Atrophie Blanche: No Cyanosis: No Ecchymosis: No Erythema: No Mottled: No Pallor: No Rubor: No No Abnormality Debridement] [[id number] Page 3 of 9 N/A N/A N/A N/A N/A N/A N/A N/A N/A N/A N/A N/A N/A N/A N/A  N/A N/A N/A N/A N/A N/A N/A N/A N/A N/A N/A N/A N/A N/A N/A N/A N/A N/A N/A N/A N/A N/A N/A N/A N/A N/A N/A] Treatment Notes Wound #11 (Lower Leg) Wound Laterality: Left, Proximal Cleanser Peri-Wound Care Topical Primary Dressing Secondary Dressing Secured With Compression Wrap Compression Stockings Add-Ons ZARAYAH, PASEK ([id number]) [id number] Page 4 of 9 Wound #12 (Lower Leg) Wound Laterality: Left, Anterior, Distal Cleanser Soap and Water Discharge Instruction: May shower and wash wound with dial antibacterial soap and water prior to dressing  change. Vashe 5.8 (oz) Discharge Instruction: Cleanse the wound with Vashe prior to applying a clean dressing using gauze sponges, not tissue or cotton balls. Peri-Wound Care Sween Lotion (Moisturizing lotion) Discharge Instruction: Apply moisturizing lotion as directed Topical Primary Dressing Hydrofera Blue Ready Transfer Foam, 2.5x2.5 (in/in) Discharge Instruction: Apply directly to wound bed as directed Santyl Ointment Discharge Instruction: Apply nickel thick amount to wound bed as instructed Secondary Dressing ABD Pad, 8x10 Discharge Instruction: Apply over primary dressing as directed. Woven Gauze Sponge, Non-Sterile 4x4 in Discharge Instruction: Apply over primary dressing as directed. Secured With Compression Wrap Kerlix Roll 4.5x3.1 (in/yd) Discharge Instruction: Apply Kerlix and Coban compression as directed. Coban Self-Adherent Wrap 4x5 (in/yd) Discharge Instruction: Apply over Kerlix as directed. Compression Stockings Add-Ons Electronic Signature(s) Signed: 08/06/2023 2:04:51 PM By: Geralyn Corwin DO Entered By: Geralyn Corwin  on 08/06/2023 12:41:48 -------------------------------------------------------------------------------- Multi-Disciplinary Care Plan Details Patient Name: Date of Service: JOURY, GUIDER 08/06/2023 11:00 A M Medical Record Number: [id number] Patient Account Number: [id number] Date of Birth/Sex: Treating RN: 05-06-1931 (89 y.o. [name] Primary Care Yancarlos Berthold: [name] Other Clinician: Referring Marishka Rentfrow: Treating Wendy Hoback/Extender: Loetta Rough in Treatment: 5 Active Inactive Nutrition Nursing Diagnoses: Potential for alteratiion in Nutrition/Potential for imbalanced nutrition Goals: Patient/caregiver agrees to and verbalizes understanding of need to obtain nutritional consultation Date Initiated: 07/02/2023 Date Inactivated: 07/30/2023 Target Resolution Date: 08/02/2023 Goal Status:  Met CHEREEN, BARRECA ([id number]) [id number] Page 5 of 9 Patient/caregiver will maintain therapeutic glucose control Date Initiated: 07/02/2023 Target Resolution Date: 08/02/2023 Goal Status: Active Interventions: Assess HgA1c results as ordered upon admission and as needed Provide education on elevated blood sugars and impact on wound healing Provide education on nutrition Treatment Activities: Patient referred to Primary Care Physician for further nutritional evaluation : 07/02/2023 Notes: Pain, Acute or Chronic Nursing Diagnoses: Pain, acute or chronic: actual or potential Potential alteration in comfort, pain Goals: Patient will verbalize adequate pain control and receive pain control interventions during procedures as needed Date Initiated: 07/02/2023 Date Inactivated: 07/30/2023 Target Resolution Date: 08/02/2023 Goal Status: Met Patient/caregiver will verbalize comfort level met Date Initiated: 07/02/2023 Target Resolution Date: 08/02/2023 Goal Status: Active Interventions: Encourage patient to take pain medications as prescribed Provide education on pain management Reposition patient for comfort Treatment Activities: Administer pain control measures as ordered : 07/02/2023 Notes: Wound/Skin Impairment Nursing Diagnoses: Knowledge deficit related to ulceration/compromised skin integrity Goals: Patient/caregiver will verbalize understanding of skin care regimen Date Initiated: 07/02/2023 Target Resolution Date: 08/02/2023 Goal Status: Active Interventions: Assess patient/caregiver ability to perform ulcer/skin care regimen upon admission and as needed Assess ulceration(s) every visit Provide education on ulcer and skin care Treatment Activities: Skin care regimen initiated : 07/02/2023 Topical wound management initiated : 07/02/2023 Notes: Electronic Signature(s) Signed: 08/14/2023 7:59:39 AM By: Brenton Grills Entered By: Brenton Grills on  08/06/2023 11:37:01 -------------------------------------------------------------------------------- Pain Assessment Details Patient Name: Date of Service: MAKIYA, BEAMESDERFER 08/06/2023 11:00 A M Medical Record Number: [id number] Patient Account Number: [id number] Date of Birth/Sex: Treating RN: [birth year] (89 y.o. F) Primary Care Kirby Argueta: [name] Other Clinician: AJAE, STFLEUR ([id number]) [id number] Page 6 of 9 Referring Zachariah Pavek: Treating Marvion Bastidas/Extender: Loetta Rough in Treatment: 5 Active Problems Location of Pain Severity and Description of Pain Patient Has Paino No Site Locations Pain Management and Medication Current Pain Management: Electronic Signature(s) Signed: 08/06/2023 5:00:52 PM By: Thayer Dallas Entered By: Thayer Dallas on 08/06/2023 11:15:31 -------------------------------------------------------------------------------- Patient/Caregiver Education Details Patient Name: Date of Service: Lovelady, Takyah G. 9/3/2024andnbsp11:00 A M Medical Record Number: [id number] Patient Account Number: [id number] Date of Birth/Gender: Treating RN: May 08, 1931 (89 y.o. [name] Primary Care Physician: [name] Other Clinician: Referring Physician: Treating Physician/Extender: Loetta Rough in Treatment: 5 Education Assessment Education Provided To: Patient and Caregiver Education Topics Provided Wound/Skin Impairment: Methods: Explain/Verbal Responses: State content correctly Electronic Signature(s) Signed: 08/14/2023 7:59:39 AM By: Brenton Grills Entered By: Brenton Grills on 08/06/2023 12:19:13 [name] ([id number]) [id number] Page 7 of 9 -------------------------------------------------------------------------------- Wound Assessment Details Patient Name: Date of Service: AMRA, NOCERINO 08/06/2023 11:00 A  M Medical Record Number: [id number] Patient Account Number: [id number] Date of Birth/Sex: Treating RN: January 08, 1931 (89 y.o. [name] Primary Care Tonilynn Bieker: [name] Other Clinician: Referring Jahliyah Trice: Treating Roman Dubuc/Extender: Loetta Rough  in Treatment: 5 Wound Status Wound Number: 11 Primary Diabetic Wound/Ulcer of the Lower Extremity Etiology: Wound Location: Left, Proximal Lower Leg Secondary Lymphedema Wounding Event: Bite Etiology: Date Acquired: 06/12/2023 Wound Healed - Epithelialized Weeks Of Treatment: 5 Status: Clustered Wound: Yes Comorbid Anemia, Congestive Heart Failure, Hypertension, Peripheral History: Venous Disease, Type II Diabetes, Osteoarthritis, Neuropathy Wound Measurements Length: (cm) Width: (cm) Depth: (cm) Clustered Quantity: Area: (cm) Volume: (cm) 0 % Reduction in Area: 100% 0 % Reduction in Volume: 100% 0 Epithelialization: Large (67-100%) 1 Tunneling: No 0 Undermining: No 0 Wound Description Classification: Grade 1 Wound Margin: Distinct, outline attached Exudate Amount: Medium Exudate Type: Serosanguineous Exudate Color: red, brown Foul Odor After Cleansing: No Slough/Fibrino Yes Wound Bed Granulation Amount: None Present (0%) Exposed Structure Necrotic Amount: None Present (0%) Fascia Exposed: No Fat Layer (Subcutaneous Tissue) Exposed: No Tendon Exposed: No Muscle Exposed: No Joint Exposed: No Bone Exposed: No Periwound Skin Texture Texture Color No Abnormalities Noted: No No Abnormalities Noted: No Callus: No Atrophie Blanche: No Crepitus: No Cyanosis: No Excoriation: No Ecchymosis: No Induration: No Erythema: No Rash: No Hemosiderin Staining: Yes Scarring: Yes Mottled: No Pallor: No Moisture Rubor: No No Abnormalities Noted: No Dry / Scaly: No Temperature / Pain Maceration: No Temperature: No Abnormality Treatment Notes Wound #11 (Lower Leg) Wound  Laterality: Left, Proximal Cleanser Peri-Wound Care Topical Primary Dressing Secondary Dressing CYTHNIA, SYSLO ([id number]) [id number] Page 8 of 9 Secured With Compression Wrap Compression Stockings Facilities manager) Signed: 08/14/2023 7:59:39 AM By: Brenton Grills Entered By: Brenton Grills on 08/06/2023 11:44:51 -------------------------------------------------------------------------------- Wound Assessment Details Patient Name: Date of Service: HALEE, MOROS 08/06/2023 11:00 A M Medical Record Number: [id number] Patient Account Number: [id number] Date of Birth/Sex: Treating RN: Jun 19, 1931 (89 y.o. F) Primary Care Carole Deere: [name] Other Clinician: Referring Dmoni Fortson: Treating Doy Taaffe/Extender: [name]: 5 Wound Status Wound Number: 12 Primary Diabetic Wound/Ulcer of the Lower Extremity Etiology: Wound Location: Left, Distal, Anterior Lower Leg Secondary Lymphedema Wounding Event: Blister Etiology: Date Acquired: 06/12/2023 Wound Open Weeks Of Treatment: 5 Status: Clustered Wound: No Comorbid Anemia, Congestive Heart Failure, Hypertension, Peripheral History: Venous Disease, Type II Diabetes, Osteoarthritis, Neuropathy Wound Measurements Length: (cm) 1 Width: (cm) 0.5 Depth: (cm) 0.1 Area: (cm) 0.393 Volume: (cm) 0.039 % Reduction in Area: 89.6% % Reduction in Volume: 89.7% Epithelialization: Small (1-33%) Tunneling: No Undermining: No Wound Description Classification: Grade 1 Wound Margin: Distinct, outline attached Exudate Amount: Medium Exudate Type: Serosanguineous Exudate Color: red, brown Foul Odor After Cleansing: No Slough/Fibrino Yes Wound Bed Granulation Amount: Medium (34-66%) Exposed Structure Granulation Quality: Pink, Pale Fascia Exposed: No Necrotic Amount: Medium (34-66%) Fat Layer (Subcutaneous Tissue) Exposed: No Necrotic Quality:  Adherent Slough Tendon Exposed: No Muscle Exposed: No Joint Exposed: No Bone Exposed: No Periwound Skin Texture Texture Color No Abnormalities Noted: No No Abnormalities Noted: No Callus: No Atrophie Blanche: No Crepitus: No Cyanosis: No Excoriation: No Ecchymosis: No Induration: No Erythema: No Rash: No Hemosiderin Staining: Yes Scarring: Yes Mottled: No Pallor: No Moisture Rubor: No No Abnormalities Noted: No Dry / Scaly: No Temperature / Pain Maceration: No Temperature: No Abnormality KENNEDIE, JANKOWIAK ([id number]) [id number] Page 9 of 9 Electronic Signature(s) Signed: 08/06/2023 5:00:52 PM By: Thayer Dallas Entered By: Thayer Dallas on 08/06/2023 11:25:30 -------------------------------------------------------------------------------- Vitals Details Patient Name: Date of Service: [name] 08/06/2023 11:00 A M Medical Record Number: [id number] Patient Account Number: [id number] Date of Birth/Sex: Treating RN: 25-May-1931 (89 y.o. F) Primary Care Jayla Mackie: Renae Gloss,  Merlene Laughter Other Clinician: Referring Jaleeah Slight: Treating Ahyan Kreeger/Extender: Loetta Rough in Treatment: 5 Vital Signs Time Taken: 11:14 Temperature (F): 97.6 Height (in): 62 Pulse (bpm): 56 Weight (lbs): 220 Respiratory Rate (breaths/min): 18 Body Mass Index (BMI): 40.2 Blood Pressure (mmHg): 138/63 Reference Range: 80 - 120 mg / dl Electronic Signature(s) Signed: 08/06/2023 5:00:52 PM By: Thayer Dallas Entered By: Thayer Dallas on 08/06/2023 11:14:42

## 2023-08-14 NOTE — Progress Notes (Signed)
MONDA, BELVEAL ([id number]) [id number] Page 1 of 8 Visit Report for 08/06/2023 Chief Complaint Document Details Patient Name: Date of Service: [name], [name] 08/06/2023 11:00 A M Medical Record Number: [id number] Patient Account Number: [id number] Date of Birth/Sex: Treating RN: 21-Dec-1930 (89 y.o. F) Primary Care Provider: [name] Other Clinician: Referring Provider: Treating Provider/Extender: Loetta Rough in Treatment: 5 Information Obtained from: Patient Chief Complaint 07/02/2023; bilateral lower extremity wounds Electronic Signature(s) Signed: 08/06/2023 2:04:51 PM By: Geralyn Corwin DO Entered By: Geralyn Corwin on 08/06/2023 09:41:59 -------------------------------------------------------------------------------- Debridement Details Patient Name: Date of Service: [name] 08/06/2023 11:00 A M Medical Record Number: [id number] Patient Account Number: [id number] Date of Birth/Sex: Treating RN: 1930-12-05 (89 y.o. [name] Primary Care Provider: [name] Other Clinician: Referring Provider: Treating Provider/Extender: Loetta Rough in Treatment: 5 Debridement Performed for Assessment: Wound #12 Left,Distal,Anterior Lower Leg Performed By: Physician Geralyn Corwin, DO Debridement Type: Debridement Severity of Tissue Pre Debridement: Fat layer exposed Level of Consciousness (Pre-procedure): Awake and Alert Pre-procedure Verification/Time Out Yes - 11:45 Taken: Start Time: 11:47 Pain Control: Lidocaine 4% T opical Solution Percent of Wound Bed Debrided: 100% T Area Debrided (cm): otal 0.39 Tissue and other material debrided: Non-Viable, Slough, Slough Level: Non-Viable Tissue Debridement Description: Selective/Open Wound Instrument: Curette Bleeding: Minimum Hemostasis Achieved: Pressure Procedural Pain: 0 Post Procedural Pain:  0 Response to Treatment: Procedure was tolerated well Level of Consciousness (Post- Awake and Alert procedure): Post Debridement Measurements of Total Wound Length: (cm) 1 Width: (cm) 0.5 Depth: (cm) 0.1 Volume: (cm) 0.039 Character of Wound/Ulcer Post Debridement: Improved Severity of Tissue Post Debridement: Fat layer exposed [name], [name] ([id number]) [id number] Page 2 of 8 Post Procedure Diagnosis Same as Pre-procedure Notes Scribed for Dr [name] by Katherine Grills RN Electronic Signature(s) Signed: 08/06/2023 2:04:51 PM By: Geralyn Corwin DO Signed: 08/14/2023 7:59:39 AM By: [name] Entered By: [name] on 08/06/2023 08:46:20 -------------------------------------------------------------------------------- HPI Details Patient Name: Date of Service: [name] 08/06/2023 11:00 A M Medical Record Number: [id number] Patient Account Number: [id number] Date of Birth/Sex: Treating RN: 06-28-1931 (89 y.o. 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 She is not quite sure how the right upper leg wound started. The left lower extremity wound started out as blisters. She has compression stockings but has not been wearing them. She takes Lasix 20 mg daily. She currently denies signs of infection. In office ABIs were 0.57 on the left however dorsalis pedal pulses and posterior tibialis pulses were heard on Doppler. 8/6; patient presents for follow-up. We have been using antibiotic  ointment with Hydrofera Blue under Kerlix/Coban to the left lower extremity. Hydrofera Blue to the right lower leg. She came in for nurse visit and wrap change. She reports no issues. She reports that she is scheduled for her ABIs next week. 8/13; patient presents for follow-up. We have been using Hydrofera Blue and Santyl to the left lower extremity under Kerlix/Coban. She has been using Hydrofera Blue to the right upper leg wound. She had ABIs completed yesterday that showed an ABI of 0.91 on  the left and 0.92 on the right. She has no issues or complaints today. 8/20; patient presents for follow-up. We have been using Hydrofera Blue and Santyl to the left lower extremity under 3 layer compression and Hydrofera Blue to the right upper leg wound. The right upper leg wound has healed. She feels that the wrap was too tight with the 3 layer. We will go back down to Kerlix/Coban. Other than that she has no issues or complaints. She denies signs of infection. Wounds Appear smaller. 8/27; patient presents for follow-up. We have been using Hydrofera Blue and Santyl to the left lower extremity wounds under Kerlix/Coban. She tolerated this well. She has no issues or complaints today. Wounds are smaller. 9/3; patient presents for follow-up. We have been using Hydrofera Blue and Santyl to the left lower extremity wound under Kerlix/Coban. Wound is smaller. Electronic Signature(s) Signed: 08/06/2023 2:04:51 PM By: Geralyn Corwin DO Entered By: Geralyn Corwin on 08/06/2023 09:42:21 -------------------------------------------------------------------------------- Physical Exam Details Patient Name: Date of Service: [name], [name] 08/06/2023 11:00 A M Medical Record Number: [id number] Patient Account Number: [id number] Date of Birth/Sex: Treating RN: [birth year] (89 y.o. F) Primary Care Provider: [name] Other Clinician: Referring Provider: Treating Provider/Extender: Loetta Rough in Treatment: [address] [name], Katherine ([id number]) [id number] Page 3 of 8 respirations regular, non-labored and within target range for patient.. Cardiovascular 2+ dorsalis pedis/posterior tibialis pulses. Psychiatric pleasant and cooperative. Notes T the anterior aspect there is an open wound with granulation tissue along with devitalized tissue. Good edema control. No signs of infection. o Electronic Signature(s) Signed: 08/06/2023 2:04:51 PM By: Geralyn Corwin DO Entered By: Geralyn Corwin on 08/06/2023 09:44:04 -------------------------------------------------------------------------------- Physician Orders Details Patient Name: Date of Service: [name] 08/06/2023 11:00 A M Medical Record Number: [id number] Patient Account Number: [id number] Date of Birth/Sex: Treating RN: 1931/07/07 (89 y.o. [name] Primary Care Provider: [name] Other Clinician: Referring Provider: Treating Provider/Extender: Loetta Rough in Treatment: 5 Verbal / Phone Orders: No Diagnosis Coding Follow-up Appointments ppointment in 1 week. - Dr. Mikey [name] Tuesday Return A Room 8 Return Appointment in 2 weeks. Anesthetic (In clinic) Topical Lidocaine 4% applied to wound bed Bathing/ Shower/ Hygiene May shower with protection but do not get wound dressing(s) wet. Protect dressing(s) with water repellant cover (for example, large plastic bag) or a cast cover and may then take shower. Edema Control - Lymphedema / SCD / Other Elevate legs to the level of the heart or above for 30 minutes daily and/or when sitting for 3-4 times a day throughout the day. Avoid standing for long periods of time. Additional Orders / Instructions Follow Nutritious Diet - increase protein Juven Shake 1-2 times daily. Wound Treatment Wound #12 - Lower Leg Wound Laterality: Left, Anterior, Distal Cleanser: Soap  and Water 1 x Per Week/30 Days Discharge Instructions: May shower and wash wound with dial antibacterial soap and water prior to dressing change. Cleanser: Vashe 5.8 (oz) 1 x Per Week/30 Days Discharge Instructions: Cleanse the wound with Vashe prior to applying a clean dressing using gauze sponges, not tissue or cotton balls. Peri-Wound Care: Sween Lotion (Moisturizing lotion) 1 x Per Week/30 Days Discharge Instructions: Apply moisturizing lotion as directed Prim Dressing: Hydrofera Blue Ready Transfer Foam, 2.5x2.5 (in/in) 1 x Per Week/30 Days ary Discharge Instructions: Apply directly to wound bed as directed Prim Dressing: Santyl Ointment 1 x Per Week/30 Days ary Discharge Instructions: Apply nickel thick amount to wound  bed as instructed Secondary Dressing: ABD Pad, 8x10 1 x Per Week/30 Days Discharge Instructions: Apply over primary dressing as directed. Secondary Dressing: Woven Gauze Sponge, Non-Sterile 4x4 in 1 x Per Week/30 Days Discharge Instructions: Apply over primary dressing as directed. [name], [name] ([id number]) [id number] Page 4 of 8 Compression Wrap: Kerlix Roll 4.5x3.1 (in/yd) 1 x Per Week/30 Days Discharge Instructions: Apply Kerlix and Coban compression as directed. Compression Wrap: Coban Self-Adherent Wrap 4x5 (in/yd) 1 x Per Week/30 Days Discharge Instructions: Apply over Kerlix as directed. Electronic Signature(s) Signed: 08/06/2023 2:04:51 PM By: Geralyn Corwin DO Entered By: Geralyn Corwin on 08/06/2023 09:44:20 -------------------------------------------------------------------------------- Problem List Details Patient Name: Date of Service: [name] 08/06/2023 11:00 A M Medical Record Number: [id number] Patient Account Number: [id number] Date of Birth/Sex: Treating RN: 10-21-1931 (89 y.o. F) Primary Care Provider: [name] Other Clinician: Referring Provider: Treating Provider/Extender: Loetta Rough in Treatment: 5 Active Problems ICD-10 Encounter Code Description Active Date MDM Diagnosis [phone] Non-pressure chronic ulcer of other part of right lower leg with fat layer 07/02/2023 No Yes exposed L97.822 Non-pressure chronic ulcer of other part of left lower leg with fat layer exposed7/30/2024 No Yes I89.0 Lymphedema, not elsewhere classified 07/02/2023 No Yes E11.622 Type 2 diabetes mellitus with other skin ulcer 07/02/2023 No Yes I50.42 Chronic combined systolic (congestive) and diastolic (congestive) heart failure 07/02/2023 No Yes Inactive Problems Resolved Problems Electronic Signature(s) Signed: 08/06/2023 2:04:51 PM By: Geralyn Corwin DO Entered By: Geralyn Corwin on 08/06/2023 09:41:34 [name], [name] ([id number]) [id number] Page 5 of 8 -------------------------------------------------------------------------------- Progress Note Details Patient Name: Date of Service: [name], [name] 08/06/2023 11:00 A M Medical Record Number: [id number] Patient Account Number: [id number] Date of Birth/Sex: Treating RN: 1931-04-23 (89 y.o. F) Primary Care Provider: [name] Other Clinician: Referring Provider: Treating Provider/Extender: Loetta Rough in Treatment: 5 Subjective Chief Complaint Information obtained from Patient 07/02/2023; bilateral lower extremity wounds History of Present Illness (HPI) 07/02/2023 Ms. 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 She is not quite sure how the right upper leg wound started. The left lower extremity wound started out as blisters. She has compression stockings but has not been wearing them. She  takes Lasix 20 mg daily. She currently denies signs of infection. In office ABIs were 0.57 on the left however dorsalis pedal pulses and posterior tibialis pulses were heard on Doppler. 8/6; patient presents for follow-up. We have been using antibiotic ointment with Hydrofera Blue under Kerlix/Coban to the left lower extremity. Hydrofera Blue to the right lower leg. She came in for nurse visit and wrap change. She reports no issues. She reports that she is scheduled for her ABIs next week. 8/13; patient presents for follow-up. We have been using Hydrofera Blue and Santyl to the left lower extremity under Kerlix/Coban. She has been using Hydrofera Blue to the right upper leg wound. She had ABIs completed yesterday that showed an ABI of 0.91 on the left and 0.92 on the right. She has no issues or complaints today. 8/20; patient presents for follow-up. We have been using Hydrofera Blue and Santyl to the left lower extremity under 3 layer compression and Hydrofera Blue to the right upper leg wound. The right upper leg wound has healed. She feels that  the wrap was too tight with the 3 layer. We will go back down to Kerlix/Coban. Other than that she has no issues or complaints. She denies signs of infection. Wounds Appear smaller. 8/27; patient presents for follow-up. We have been using Hydrofera Blue and Santyl to the left lower extremity wounds under Kerlix/Coban. She tolerated this well. She has no issues or complaints today. Wounds are smaller. 9/3; patient presents for follow-up. We have been using Hydrofera Blue and Santyl to the left lower extremity wound under Kerlix/Coban. Wound is smaller. Patient History Medical History Hematologic/Lymphatic Patient has history of Anemia Cardiovascular Patient has history of Congestive Heart Failure, Hypertension, Peripheral Venous Disease Endocrine Patient has history of Type II Diabetes Musculoskeletal Patient has history of  Osteoarthritis Neurologic Patient has history of Neuropathy Medical A Surgical History Notes nd Cardiovascular hyperlipidemia, venous stasis Gastrointestinal GERD Psychiatric generalized anxiety Objective Constitutional respirations regular, non-labored and within target range for patient.. Vitals Time Taken: 11:14 AM, Height: 62 in, Weight: 220 lbs, BMI: 40.2, Temperature: 97.6 F, Pulse: 56 bpm, Respiratory Rate: 18 breaths/min, Blood Pressure: 138/63 mmHg. Cardiovascular 2+ dorsalis pedis/posterior tibialis pulses. Psychiatric pleasant and cooperative. [name], [name] ([id number]) [id number] Page 6 of 8 General Notes: T the anterior aspect there is an open wound with granulation tissue along with devitalized tissue. Good edema control. No signs of infection. o Integumentary (Hair, Skin) Wound #11 status is Healed - Epithelialized. Original cause of wound was Bite. The date acquired was: 06/12/2023. The wound has been in treatment 5 weeks. The wound is located on the Left,Proximal Lower Leg. The wound measures 0cm length x 0cm width x 0cm depth; 0cm^2 area and 0cm^3 volume. There is no tunneling or undermining noted. There is a medium amount of serosanguineous drainage noted. The wound margin is distinct with the outline attached to the wound base. There is no granulation within the wound bed. There is no necrotic tissue within the wound bed. The periwound skin appearance exhibited: Scarring, Hemosiderin Staining. The periwound skin appearance did not exhibit: Callus, Crepitus, Excoriation, Induration, Rash, Dry/Scaly, Maceration, Atrophie Blanche, Cyanosis, Ecchymosis, Mottled, Pallor, Rubor, Erythema. Periwound temperature was noted as No Abnormality. Wound #12 status is Open. Original cause of wound was Blister. The date acquired was: 06/12/2023. The wound has been in treatment 5 weeks. The wound is located on the [facility] Lower Leg. The  wound measures 1cm length x 0.5cm width x 0.1cm depth; 0.393cm^2 area and 0.039cm^3 volume. There is no tunneling or undermining noted. There is a medium amount of serosanguineous drainage noted. The wound margin is distinct with the outline attached to the wound base. There is medium (34-66%) pink, pale granulation within the wound bed. There is a medium (34-66%) amount of necrotic tissue within the wound bed including Adherent Slough. The periwound skin appearance exhibited: Scarring, Hemosiderin Staining. The periwound skin appearance did not exhibit: Callus, Crepitus, Excoriation, Induration, Rash, Dry/Scaly, Maceration, Atrophie Blanche, Cyanosis, Ecchymosis, Mottled, Pallor, Rubor, Erythema. Periwound temperature was noted as No Abnormality. Assessment Active Problems ICD-10 Non-pressure chronic ulcer of other part of right lower leg with fat layer exposed Non-pressure chronic ulcer of other part of left lower leg with fat layer exposed Lymphedema, not elsewhere classified Type 2 diabetes mellitus with other skin ulcer Chronic combined systolic (congestive) and diastolic (congestive) heart failure Patient's wound has shown improvement in size and appearance since last clinic visit. I debrided nonviable tissue. I recommended continuing the course with Santyl and Hydrofera Blue under Kerlix/Coban to the left lower extremity.  Follow-up in 1 week. Procedures Wound #12 Pre-procedure diagnosis of Wound #12 is a Diabetic Wound/Ulcer of the Lower Extremity located on the Left,Distal,Anterior Lower Leg .Severity of Tissue Pre Debridement is: Fat layer exposed. There was a Selective/Open Wound Non-Viable Tissue Debridement with a total area of 0.39 sq cm performed by Geralyn Corwin, DO. With the following instrument(s): Curette to remove Non-Viable tissue/material. Material removed includes [facility] after achieving pain control using Lidocaine 4% Topical Solution. No specimens were taken. A time out  was conducted at 11:45, prior to the start of the procedure. A Minimum amount of bleeding was controlled with Pressure. The procedure was tolerated well with a pain level of 0 throughout and a pain level of 0 following the procedure. Post Debridement Measurements: 1cm length x 0.5cm width x 0.1cm depth; 0.039cm^3 volume. Character of Wound/Ulcer Post Debridement is improved. Severity of Tissue Post Debridement is: Fat layer exposed. Post procedure Diagnosis Wound #12: Same as Pre-Procedure General Notes: Scribed for Dr [name] by Katherine Grills RN. Plan Follow-up Appointments: Return Appointment in 1 week. - Dr. Mikey [name] Tuesday Room 8 Return Appointment in 2 weeks. Anesthetic: (In clinic) Topical Lidocaine 4% applied to wound bed Bathing/ Shower/ Hygiene: May shower with protection but do not get wound dressing(s) wet. Protect dressing(s) with water repellant cover (for example, large plastic bag) or a cast cover and may then take shower. Edema Control - Lymphedema / SCD / Other: Elevate legs to the level of the heart or above for 30 minutes daily and/or when sitting for 3-4 times a day throughout the day. Avoid standing for long periods of time. Additional Orders / Instructions: Follow Nutritious Diet - increase protein Juven Shake 1-2 times daily. WOUND #12: - Lower Leg Wound Laterality: Left, Anterior, Distal Cleanser: Soap and Water 1 x Per Week/30 Days Discharge Instructions: May shower and wash wound with dial antibacterial soap and water prior to dressing change. Cleanser: Vashe 5.8 (oz) 1 x Per Week/30 Days Discharge Instructions: Cleanse the wound with Vashe prior to applying a clean dressing using gauze sponges, not tissue or cotton balls. Peri-Wound Care: Sween Lotion (Moisturizing lotion) 1 x Per Week/30 Days Discharge Instructions: Apply moisturizing lotion as directed Prim Dressing: Hydrofera Blue Ready Transfer Foam, 2.5x2.5 (in/in) 1 x Per Week/30 Days ary Discharge  Instructions: Apply directly to wound bed as directed Prim Dressing: Santyl Ointment 1 x Per Week/30 Days ary Discharge Instructions: Apply nickel thick amount to wound bed as instructed Secondary Dressing: ABD Pad, 8x10 1 x Per Week/30 Days Discharge Instructions: Apply over primary dressing as directed. [name], [name] ([id number]) [id number] Page 7 of 8 Secondary Dressing: Woven Gauze Sponge, Non-Sterile 4x4 in 1 x Per Week/30 Days Discharge Instructions: Apply over primary dressing as directed. Compression Wrap: Kerlix Roll 4.5x3.1 (in/yd) 1 x Per Week/30 Days Discharge Instructions: Apply Kerlix and Coban compression as directed. Compression Wrap: Coban Self-Adherent Wrap 4x5 (in/yd) 1 x Per Week/30 Days Discharge Instructions: Apply over Kerlix as directed. 1. In office sharp debridement 2. Hydrofera Blue with Santyl under Kerlix/Cobanleft lower extremity 3. Follow-up in 1 week Electronic Signature(s) Signed: 08/06/2023 2:04:51 PM By: Geralyn Corwin DO Entered By: Geralyn Corwin on 08/06/2023 09:48:03 -------------------------------------------------------------------------------- HxROS Details Patient Name: Date of Service: [name] 08/06/2023 11:00 A M Medical Record Number: [id number] Patient Account Number: [id number] Date of Birth/Sex: Treating RN: August 03, 1931 (89 y.o. F) Primary Care Provider: [name] Other Clinician: Referring Provider: Treating Provider/Extender: Loetta Rough in Treatment: 5 Hematologic/Lymphatic Medical  History: Positive for: Anemia Cardiovascular Medical History: Positive for: Congestive Heart Failure; Hypertension; Peripheral Venous Disease Past Medical History Notes: hyperlipidemia, venous stasis Gastrointestinal Medical History: Past Medical History Notes: GERD Endocrine Medical History: Positive for: Type II Diabetes Musculoskeletal Medical  History: Positive for: Osteoarthritis Neurologic Medical History: Positive for: Neuropathy Psychiatric Medical History: Past Medical History Notes: generalized anxiety Immunizations Pneumococcal VaccineSURAIYA, [name] ([id number]) [id number] Page 8 of 8 Received Pneumococcal Vaccination: Yes Received Pneumococcal Vaccination On or After 60th Birthday: Yes Implantable Devices No devices added Electronic Signature(s) Signed: 08/06/2023 2:04:51 PM By: Geralyn Corwin DO Entered By: Geralyn Corwin on 08/06/2023 09:42:39 -------------------------------------------------------------------------------- SuperBill Details Patient Name: Date of Service: [name] 08/06/2023 Medical Record Number: [id number] Patient Account Number: [id number] Date of Birth/Sex: Treating RN: Dec 06, 1930 (89 y.o. [name] Primary Care Provider: [name] Other Clinician: Referring Provider: Treating Provider/Extender: Loetta Rough in Treatment: 5 Diagnosis Coding ICD-10 Codes Code Description [phone] Non-pressure chronic ulcer of other part of right lower leg with fat layer exposed L97.822 Non-pressure chronic ulcer of other part of left lower leg with fat layer exposed I89.0 Lymphedema, not elsewhere classified E11.622 Type 2 diabetes mellitus with other skin ulcer I50.42 Chronic combined systolic (congestive) and diastolic (congestive) heart failure Facility Procedures : CPT4 Code: [id number] Description: 97597 - DEBRIDE WOUND 1ST 20 SQ CM OR < ICD-10 Diagnosis Description L97.812 Non-pressure chronic ulcer of other part of right lower leg with fat layer expos E11.622 Type 2 diabetes mellitus with other skin ulcer I89.0 Lymphedema, not  elsewhere classified Modifier: ed Quantity: 1 Physician Procedures : CPT4 Code Description Modifier [id number] - WC PHYS DEBR WO ANESTH 20 SQ CM ICD-10 Diagnosis Description  L97.812 Non-pressure chronic ulcer of other part of right lower leg with fat layer exposed E11.622 Type 2 diabetes mellitus with other skin  ulcer I89.0 Lymphedema, not elsewhere classified Quantity: 1 Electronic Signature(s) Signed: 08/06/2023 2:04:51 PM By: Geralyn Corwin DO Entered By: Geralyn Corwin on 08/06/2023 09:48:23

## 2023-08-20 ENCOUNTER — Encounter (HOSPITAL_BASED_OUTPATIENT_CLINIC_OR_DEPARTMENT_OTHER): Payer: Medicare PPO | Admitting: Internal Medicine

## 2023-08-20 DIAGNOSIS — E11622 Type 2 diabetes mellitus with other skin ulcer: Secondary | ICD-10-CM | POA: Diagnosis not present

## 2023-08-21 NOTE — Progress Notes (Signed)
the following instrument(s): Curette to remove Viable and Non-Viable tissue/material. Material removed includes Slough, Skin: Dermis, and Skin: Epidermis after achieving pain control using Lidocaine 4% Topical Solution. A time out was conducted at 11:40, prior to the start of the procedure. A Minimum amount of bleeding was controlled with Pressure. The procedure was tolerated well with a pain level of 0 throughout and a pain level of 0 following the procedure. Post Debridement Measurements: 1cm length x 0.3cm width x 0.1cm depth; 0.024cm^3 volume. Character of Wound/Ulcer Post Debridement is improved. Severity of Tissue Post Debridement is: Fat layer exposed. Post procedure Diagnosis Wound #12: Same as Pre-Procedure [name], Katherine ([id number]) [id number].pdf Page 6 of 7 Plan Follow-up Appointments: Return Appointment in 1 week. - Dr. Leanord Hawking 08/27/2023 1100 Return Appointment in 2 weeks. - Dr. Leanord Hawking (Front office to schedule) Return appointment in 3 weeks. - Dr. Leanord Hawking (Front office to  schedule) Anesthetic: (In clinic) Topical Lidocaine 4% applied to wound bed Bathing/ Shower/ Hygiene: May shower with protection but do not get wound dressing(s) wet. Protect dressing(s) with water repellant cover (for example, large plastic bag) or a cast cover and may then take shower. Edema Control - Lymphedema / SCD / Other: Elevate legs to the level of the heart or above for 30 minutes daily and/or when sitting for 3-4 times a day throughout the day. Avoid standing for long periods of time. Exercise regularly Additional Orders / Instructions: Follow Nutritious Diet - increase protein Juven Shake 1-2 times daily. WOUND #12: - Lower Leg Wound Laterality: Left, Anterior, Distal Cleanser: Soap and Water 1 x Per Week/30 Days Discharge Instructions: May shower and wash wound with dial antibacterial soap and water prior to dressing change. Cleanser: Vashe 5.8 (oz) 1 x Per Week/30 Days Discharge Instructions: Cleanse the wound with Vashe prior to applying a clean dressing using gauze sponges, not tissue or cotton balls. Peri-Wound Care: Sween Lotion (Moisturizing lotion) 1 x Per Week/30 Days Discharge Instructions: Apply moisturizing lotion as directed Prim Dressing: Hydrofera Blue Ready Transfer Foam, 2.5x2.5 (in/in) 1 x Per Week/30 Days ary Discharge Instructions: Apply directly to wound bed as directed Prim Dressing: Santyl Ointment 1 x Per Week/30 Days ary Discharge Instructions: Apply nickel thick amount to wound bed as instructed Secondary Dressing: ABD Pad, 8x10 1 x Per Week/30 Days Discharge Instructions: Apply over primary dressing as directed. Secondary Dressing: Woven Gauze Sponge, Non-Sterile 4x4 in 1 x Per Week/30 Days Discharge Instructions: Apply over primary dressing as directed. Com pression Wrap: Kerlix Roll 4.5x3.1 (in/yd) 1 x Per Week/30 Days Discharge Instructions: Apply Kerlix and Coban compression as directed. Com pression Wrap: Coban Self-Adherent Wrap 4x5 (in/yd)  1 x Per Week/30 Days Discharge Instructions: Apply over Kerlix as directed. 1. I used a #3 curette to remove surrounding skin and eschar from the wound surface this cleaned up quite nicely 2. We are still going to continue the Santyl and Hydrofera Blue under kerlix Coban 3. We had preliminary discussions about compression stockings likely a JuxtaLite stocking for especially the left leg Electronic Signature(s) Signed: 08/20/2023 4:34:08 PM By: Baltazar Najjar MD Entered By: Baltazar Najjar on 08/20/2023 09:13:08 -------------------------------------------------------------------------------- SuperBill Details Patient Name: Date of Service: [name] 08/20/2023 Medical Record Number: [id number] Patient Account Number: [id number] Date of Birth/Sex: Treating RN: 1931-04-19 (89 y.o. [name] Primary Care Provider: [name] Other Clinician: Referring Provider: Treating Provider/Extender: [name] in Treatment: 7 Diagnosis Coding ICD-10 Codes Code Description [phone] Non-pressure chronic ulcer of  go back down to Kerlix/Coban. Other than that she has no issues or complaints. She denies signs of infection. Wounds Appear smaller. 8/27; patient presents for follow-up. We have been using Hydrofera Blue and Santyl to the left lower extremity wounds under Kerlix/Coban. She tolerated this well. She has no issues or complaints today. Wounds are smaller. 9/3; patient presents for follow-up. We have been using Hydrofera Blue and Santyl to the left lower extremity wound under Kerlix/Coban. Wound is smaller. 9/10; patient presents for follow-up. We have been using Hydrofera Blue and Santyl to the lower extremity under Kerlix/Coban. Wound appears smaller and well-healing. 9/17 continued improvement we have been using Santyl and Hydrofera Blue under kerlix Coban to the left leg wound largely secondary to chronic lymphedema Electronic Signature(s) Signed: 08/20/2023 4:34:08 PM By: Baltazar Najjar MD Entered By: Baltazar Najjar on 08/20/2023 09:09:52 -------------------------------------------------------------------------------- Physical Exam Details Patient Name: Date of Service: [name] 08/20/2023 11:00 A M Medical Record Number: [id number] Patient Account Number: [id number] Date of Birth/Sex: Treating RN: 12-Jan-1931 (89 y.o. F) Primary Care Provider: [name] Other Clinician: Referring Provider: Treating Provider/Extender: [name] in Treatment: 7 Constitutional Sitting or standing Blood Pressure is within target range for patient.. Pulse regular and within target range for patient.Marland Kitchen Respirations regular, non-labored and within target range.. Temperature is normal and within the target  range for the patient.Marland Kitchen Appears in no distress. Notes Wound exam; small open wound remains on the left anterior lower leg. Because of flaking skin around the edges and adherent debris over the wound surface I used a #3 curette to remove this there was minimal bleeding Electronic Signature(s) Signed: 08/20/2023 4:34:08 PM By: Baltazar Najjar MD Entered By: Baltazar Najjar on 08/20/2023 09:11:39 -------------------------------------------------------------------------------- Physician Orders Details Patient Name: Date of Service: [name] 08/20/2023 11:00 A M Medical Record Number: [id number] Patient Account Number: [id number] [name], [name] ([id number]) [phone].pdf Page 3 of 7 Date of Birth/Sex: Treating RN: Sep 13, 1931 (89 y.o. 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 - Dr. Leanord Hawking 08/27/2023 1100 Return A ppointment in 2 weeks. - Dr. Leanord Hawking (Front office to schedule) Return A Return appointment in 3 weeks. - Dr. Leanord Hawking (Front office to schedule) Anesthetic (In clinic) Topical Lidocaine 4% applied to wound bed Bathing/ Shower/ Hygiene May shower with protection but do not get wound dressing(s) wet. Protect dressing(s) with water repellant cover (for example, large plastic bag) or a cast cover and may then take shower. Edema  Control - Lymphedema / SCD / Other Elevate legs to the level of the heart or above for 30 minutes daily and/or when sitting for 3-4 times a day throughout the day. Avoid standing for long periods of time. Exercise regularly Additional Orders / Instructions Follow Nutritious Diet - increase protein Juven Shake 1-2 times daily. Wound Treatment Wound #12 - Lower Leg Wound Laterality: Left, Anterior, Distal Cleanser: Soap and Water 1 x Per Week/30 Days Discharge Instructions: May shower and wash wound with dial antibacterial  the following instrument(s): Curette to remove Viable and Non-Viable tissue/material. Material removed includes Slough, Skin: Dermis, and Skin: Epidermis after achieving pain control using Lidocaine 4% Topical Solution. A time out was conducted at 11:40, prior to the start of the procedure. A Minimum amount of bleeding was controlled with Pressure. The procedure was tolerated well with a pain level of 0 throughout and a pain level of 0 following the procedure. Post Debridement Measurements: 1cm length x 0.3cm width x 0.1cm depth; 0.024cm^3 volume. Character of Wound/Ulcer Post Debridement is improved. Severity of Tissue Post Debridement is: Fat layer exposed. Post procedure Diagnosis Wound #12: Same as Pre-Procedure [name], Katherine ([id number]) [id number].pdf Page 6 of 7 Plan Follow-up Appointments: Return Appointment in 1 week. - Dr. Leanord Hawking 08/27/2023 1100 Return Appointment in 2 weeks. - Dr. Leanord Hawking (Front office to schedule) Return appointment in 3 weeks. - Dr. Leanord Hawking (Front office to  schedule) Anesthetic: (In clinic) Topical Lidocaine 4% applied to wound bed Bathing/ Shower/ Hygiene: May shower with protection but do not get wound dressing(s) wet. Protect dressing(s) with water repellant cover (for example, large plastic bag) or a cast cover and may then take shower. Edema Control - Lymphedema / SCD / Other: Elevate legs to the level of the heart or above for 30 minutes daily and/or when sitting for 3-4 times a day throughout the day. Avoid standing for long periods of time. Exercise regularly Additional Orders / Instructions: Follow Nutritious Diet - increase protein Juven Shake 1-2 times daily. WOUND #12: - Lower Leg Wound Laterality: Left, Anterior, Distal Cleanser: Soap and Water 1 x Per Week/30 Days Discharge Instructions: May shower and wash wound with dial antibacterial soap and water prior to dressing change. Cleanser: Vashe 5.8 (oz) 1 x Per Week/30 Days Discharge Instructions: Cleanse the wound with Vashe prior to applying a clean dressing using gauze sponges, not tissue or cotton balls. Peri-Wound Care: Sween Lotion (Moisturizing lotion) 1 x Per Week/30 Days Discharge Instructions: Apply moisturizing lotion as directed Prim Dressing: Hydrofera Blue Ready Transfer Foam, 2.5x2.5 (in/in) 1 x Per Week/30 Days ary Discharge Instructions: Apply directly to wound bed as directed Prim Dressing: Santyl Ointment 1 x Per Week/30 Days ary Discharge Instructions: Apply nickel thick amount to wound bed as instructed Secondary Dressing: ABD Pad, 8x10 1 x Per Week/30 Days Discharge Instructions: Apply over primary dressing as directed. Secondary Dressing: Woven Gauze Sponge, Non-Sterile 4x4 in 1 x Per Week/30 Days Discharge Instructions: Apply over primary dressing as directed. Com pression Wrap: Kerlix Roll 4.5x3.1 (in/yd) 1 x Per Week/30 Days Discharge Instructions: Apply Kerlix and Coban compression as directed. Com pression Wrap: Coban Self-Adherent Wrap 4x5 (in/yd)  1 x Per Week/30 Days Discharge Instructions: Apply over Kerlix as directed. 1. I used a #3 curette to remove surrounding skin and eschar from the wound surface this cleaned up quite nicely 2. We are still going to continue the Santyl and Hydrofera Blue under kerlix Coban 3. We had preliminary discussions about compression stockings likely a JuxtaLite stocking for especially the left leg Electronic Signature(s) Signed: 08/20/2023 4:34:08 PM By: Baltazar Najjar MD Entered By: Baltazar Najjar on 08/20/2023 09:13:08 -------------------------------------------------------------------------------- SuperBill Details Patient Name: Date of Service: [name] 08/20/2023 Medical Record Number: [id number] Patient Account Number: [id number] Date of Birth/Sex: Treating RN: 1931-04-19 (89 y.o. [name] Primary Care Provider: [name] Other Clinician: Referring Provider: Treating Provider/Extender: [name] in Treatment: 7 Diagnosis Coding ICD-10 Codes Code Description [phone] Non-pressure chronic ulcer of  the following instrument(s): Curette to remove Viable and Non-Viable tissue/material. Material removed includes Slough, Skin: Dermis, and Skin: Epidermis after achieving pain control using Lidocaine 4% Topical Solution. A time out was conducted at 11:40, prior to the start of the procedure. A Minimum amount of bleeding was controlled with Pressure. The procedure was tolerated well with a pain level of 0 throughout and a pain level of 0 following the procedure. Post Debridement Measurements: 1cm length x 0.3cm width x 0.1cm depth; 0.024cm^3 volume. Character of Wound/Ulcer Post Debridement is improved. Severity of Tissue Post Debridement is: Fat layer exposed. Post procedure Diagnosis Wound #12: Same as Pre-Procedure [name], Katherine ([id number]) [id number].pdf Page 6 of 7 Plan Follow-up Appointments: Return Appointment in 1 week. - Dr. Leanord Hawking 08/27/2023 1100 Return Appointment in 2 weeks. - Dr. Leanord Hawking (Front office to schedule) Return appointment in 3 weeks. - Dr. Leanord Hawking (Front office to  schedule) Anesthetic: (In clinic) Topical Lidocaine 4% applied to wound bed Bathing/ Shower/ Hygiene: May shower with protection but do not get wound dressing(s) wet. Protect dressing(s) with water repellant cover (for example, large plastic bag) or a cast cover and may then take shower. Edema Control - Lymphedema / SCD / Other: Elevate legs to the level of the heart or above for 30 minutes daily and/or when sitting for 3-4 times a day throughout the day. Avoid standing for long periods of time. Exercise regularly Additional Orders / Instructions: Follow Nutritious Diet - increase protein Juven Shake 1-2 times daily. WOUND #12: - Lower Leg Wound Laterality: Left, Anterior, Distal Cleanser: Soap and Water 1 x Per Week/30 Days Discharge Instructions: May shower and wash wound with dial antibacterial soap and water prior to dressing change. Cleanser: Vashe 5.8 (oz) 1 x Per Week/30 Days Discharge Instructions: Cleanse the wound with Vashe prior to applying a clean dressing using gauze sponges, not tissue or cotton balls. Peri-Wound Care: Sween Lotion (Moisturizing lotion) 1 x Per Week/30 Days Discharge Instructions: Apply moisturizing lotion as directed Prim Dressing: Hydrofera Blue Ready Transfer Foam, 2.5x2.5 (in/in) 1 x Per Week/30 Days ary Discharge Instructions: Apply directly to wound bed as directed Prim Dressing: Santyl Ointment 1 x Per Week/30 Days ary Discharge Instructions: Apply nickel thick amount to wound bed as instructed Secondary Dressing: ABD Pad, 8x10 1 x Per Week/30 Days Discharge Instructions: Apply over primary dressing as directed. Secondary Dressing: Woven Gauze Sponge, Non-Sterile 4x4 in 1 x Per Week/30 Days Discharge Instructions: Apply over primary dressing as directed. Com pression Wrap: Kerlix Roll 4.5x3.1 (in/yd) 1 x Per Week/30 Days Discharge Instructions: Apply Kerlix and Coban compression as directed. Com pression Wrap: Coban Self-Adherent Wrap 4x5 (in/yd)  1 x Per Week/30 Days Discharge Instructions: Apply over Kerlix as directed. 1. I used a #3 curette to remove surrounding skin and eschar from the wound surface this cleaned up quite nicely 2. We are still going to continue the Santyl and Hydrofera Blue under kerlix Coban 3. We had preliminary discussions about compression stockings likely a JuxtaLite stocking for especially the left leg Electronic Signature(s) Signed: 08/20/2023 4:34:08 PM By: Baltazar Najjar MD Entered By: Baltazar Najjar on 08/20/2023 09:13:08 -------------------------------------------------------------------------------- SuperBill Details Patient Name: Date of Service: [name] 08/20/2023 Medical Record Number: [id number] Patient Account Number: [id number] Date of Birth/Sex: Treating RN: 1931-04-19 (89 y.o. [name] Primary Care Provider: [name] Other Clinician: Referring Provider: Treating Provider/Extender: [name] in Treatment: 7 Diagnosis Coding ICD-10 Codes Code Description [phone] Non-pressure chronic ulcer of  go back down to Kerlix/Coban. Other than that she has no issues or complaints. She denies signs of infection. Wounds Appear smaller. 8/27; patient presents for follow-up. We have been using Hydrofera Blue and Santyl to the left lower extremity wounds under Kerlix/Coban. She tolerated this well. She has no issues or complaints today. Wounds are smaller. 9/3; patient presents for follow-up. We have been using Hydrofera Blue and Santyl to the left lower extremity wound under Kerlix/Coban. Wound is smaller. 9/10; patient presents for follow-up. We have been using Hydrofera Blue and Santyl to the lower extremity under Kerlix/Coban. Wound appears smaller and well-healing. 9/17 continued improvement we have been using Santyl and Hydrofera Blue under kerlix Coban to the left leg wound largely secondary to chronic lymphedema Electronic Signature(s) Signed: 08/20/2023 4:34:08 PM By: Baltazar Najjar MD Entered By: Baltazar Najjar on 08/20/2023 09:09:52 -------------------------------------------------------------------------------- Physical Exam Details Patient Name: Date of Service: [name] 08/20/2023 11:00 A M Medical Record Number: [id number] Patient Account Number: [id number] Date of Birth/Sex: Treating RN: 12-Jan-1931 (89 y.o. F) Primary Care Provider: [name] Other Clinician: Referring Provider: Treating Provider/Extender: [name] in Treatment: 7 Constitutional Sitting or standing Blood Pressure is within target range for patient.. Pulse regular and within target range for patient.Marland Kitchen Respirations regular, non-labored and within target range.. Temperature is normal and within the target  range for the patient.Marland Kitchen Appears in no distress. Notes Wound exam; small open wound remains on the left anterior lower leg. Because of flaking skin around the edges and adherent debris over the wound surface I used a #3 curette to remove this there was minimal bleeding Electronic Signature(s) Signed: 08/20/2023 4:34:08 PM By: Baltazar Najjar MD Entered By: Baltazar Najjar on 08/20/2023 09:11:39 -------------------------------------------------------------------------------- Physician Orders Details Patient Name: Date of Service: [name] 08/20/2023 11:00 A M Medical Record Number: [id number] Patient Account Number: [id number] [name], [name] ([id number]) [phone].pdf Page 3 of 7 Date of Birth/Sex: Treating RN: Sep 13, 1931 (89 y.o. 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 - Dr. Leanord Hawking 08/27/2023 1100 Return A ppointment in 2 weeks. - Dr. Leanord Hawking (Front office to schedule) Return A Return appointment in 3 weeks. - Dr. Leanord Hawking (Front office to schedule) Anesthetic (In clinic) Topical Lidocaine 4% applied to wound bed Bathing/ Shower/ Hygiene May shower with protection but do not get wound dressing(s) wet. Protect dressing(s) with water repellant cover (for example, large plastic bag) or a cast cover and may then take shower. Edema  Control - Lymphedema / SCD / Other Elevate legs to the level of the heart or above for 30 minutes daily and/or when sitting for 3-4 times a day throughout the day. Avoid standing for long periods of time. Exercise regularly Additional Orders / Instructions Follow Nutritious Diet - increase protein Juven Shake 1-2 times daily. Wound Treatment Wound #12 - Lower Leg Wound Laterality: Left, Anterior, Distal Cleanser: Soap and Water 1 x Per Week/30 Days Discharge Instructions: May shower and wash wound with dial antibacterial  the following instrument(s): Curette to remove Viable and Non-Viable tissue/material. Material removed includes Slough, Skin: Dermis, and Skin: Epidermis after achieving pain control using Lidocaine 4% Topical Solution. A time out was conducted at 11:40, prior to the start of the procedure. A Minimum amount of bleeding was controlled with Pressure. The procedure was tolerated well with a pain level of 0 throughout and a pain level of 0 following the procedure. Post Debridement Measurements: 1cm length x 0.3cm width x 0.1cm depth; 0.024cm^3 volume. Character of Wound/Ulcer Post Debridement is improved. Severity of Tissue Post Debridement is: Fat layer exposed. Post procedure Diagnosis Wound #12: Same as Pre-Procedure [name], Katherine ([id number]) [id number].pdf Page 6 of 7 Plan Follow-up Appointments: Return Appointment in 1 week. - Dr. Leanord Hawking 08/27/2023 1100 Return Appointment in 2 weeks. - Dr. Leanord Hawking (Front office to schedule) Return appointment in 3 weeks. - Dr. Leanord Hawking (Front office to  schedule) Anesthetic: (In clinic) Topical Lidocaine 4% applied to wound bed Bathing/ Shower/ Hygiene: May shower with protection but do not get wound dressing(s) wet. Protect dressing(s) with water repellant cover (for example, large plastic bag) or a cast cover and may then take shower. Edema Control - Lymphedema / SCD / Other: Elevate legs to the level of the heart or above for 30 minutes daily and/or when sitting for 3-4 times a day throughout the day. Avoid standing for long periods of time. Exercise regularly Additional Orders / Instructions: Follow Nutritious Diet - increase protein Juven Shake 1-2 times daily. WOUND #12: - Lower Leg Wound Laterality: Left, Anterior, Distal Cleanser: Soap and Water 1 x Per Week/30 Days Discharge Instructions: May shower and wash wound with dial antibacterial soap and water prior to dressing change. Cleanser: Vashe 5.8 (oz) 1 x Per Week/30 Days Discharge Instructions: Cleanse the wound with Vashe prior to applying a clean dressing using gauze sponges, not tissue or cotton balls. Peri-Wound Care: Sween Lotion (Moisturizing lotion) 1 x Per Week/30 Days Discharge Instructions: Apply moisturizing lotion as directed Prim Dressing: Hydrofera Blue Ready Transfer Foam, 2.5x2.5 (in/in) 1 x Per Week/30 Days ary Discharge Instructions: Apply directly to wound bed as directed Prim Dressing: Santyl Ointment 1 x Per Week/30 Days ary Discharge Instructions: Apply nickel thick amount to wound bed as instructed Secondary Dressing: ABD Pad, 8x10 1 x Per Week/30 Days Discharge Instructions: Apply over primary dressing as directed. Secondary Dressing: Woven Gauze Sponge, Non-Sterile 4x4 in 1 x Per Week/30 Days Discharge Instructions: Apply over primary dressing as directed. Com pression Wrap: Kerlix Roll 4.5x3.1 (in/yd) 1 x Per Week/30 Days Discharge Instructions: Apply Kerlix and Coban compression as directed. Com pression Wrap: Coban Self-Adherent Wrap 4x5 (in/yd)  1 x Per Week/30 Days Discharge Instructions: Apply over Kerlix as directed. 1. I used a #3 curette to remove surrounding skin and eschar from the wound surface this cleaned up quite nicely 2. We are still going to continue the Santyl and Hydrofera Blue under kerlix Coban 3. We had preliminary discussions about compression stockings likely a JuxtaLite stocking for especially the left leg Electronic Signature(s) Signed: 08/20/2023 4:34:08 PM By: Baltazar Najjar MD Entered By: Baltazar Najjar on 08/20/2023 09:13:08 -------------------------------------------------------------------------------- SuperBill Details Patient Name: Date of Service: [name] 08/20/2023 Medical Record Number: [id number] Patient Account Number: [id number] Date of Birth/Sex: Treating RN: 1931-04-19 (89 y.o. [name] Primary Care Provider: [name] Other Clinician: Referring Provider: Treating Provider/Extender: [name] in Treatment: 7 Diagnosis Coding ICD-10 Codes Code Description [phone] Non-pressure chronic ulcer of

## 2023-08-23 NOTE — Progress Notes (Signed)
wound healing Provide education on nutrition Treatment Activities: Patient referred to Primary Care Physician for further nutritional evaluation : 07/02/2023 SADIAH, BAEZ ([id number]) [id number] Page 5 of 8 Notes: Pain, Acute or Chronic Nursing Diagnoses: Pain, acute or chronic: actual or potential Potential alteration in comfort, pain Goals: Patient will verbalize adequate pain control and receive pain control interventions during procedures as needed Date Initiated: 07/02/2023 Date Inactivated: 07/30/2023 Target Resolution Date: 08/02/2023 Goal  Status: Met Patient/caregiver will verbalize comfort level met Date Initiated: 07/02/2023 Target Resolution Date: 08/30/2023 Goal Status: Active Interventions: Encourage patient to take pain medications as prescribed Provide education on pain management Reposition patient for comfort Treatment Activities: Administer pain control measures as ordered : 07/02/2023 Notes: Wound/Skin Impairment Nursing Diagnoses: Knowledge deficit related to ulceration/compromised skin integrity Goals: Patient/caregiver will verbalize understanding of skin care regimen Date Initiated: 07/02/2023 Target Resolution Date: 08/30/2023 Goal Status: Active Interventions: Assess patient/caregiver ability to perform ulcer/skin care regimen upon admission and as needed Assess ulceration(s) every visit Provide education on ulcer and skin care Treatment Activities: Skin care regimen initiated : 07/02/2023 Topical wound management initiated : 07/02/2023 Notes: Electronic Signature(s) Signed: 08/20/2023 5:50:35 PM By: [name] RN, BSN Entered By: [name] on 08/20/2023 08:44:43 -------------------------------------------------------------------------------- Pain Assessment Details Patient Name: Date of Service: [name] 08/20/2023 11:00 A M Medical Record Number: [id number] Patient Account Number: [id number] Date of Birth/Sex: Treating RN: [birth year] (89 y.o. F) Primary Care Eliza Green: [name] Other Clinician: Referring Rasheedah Reis: Treating Dontrelle Mazon/Extender: [name] in Treatment: 7 Active Problems Location of Pain Severity and Description of Pain Patient Has Paino No Site Locations [place], [place] G ([id number]) [id number] Page 6 of 8 Pain Management and Medication Current Pain Management: Electronic Signature(s) Signed: 08/23/2023 12:48:07 PM By: Thayer Dallas Entered By: Thayer Dallas on 08/20/2023  08:27:14 -------------------------------------------------------------------------------- Patient/Caregiver Education Details Patient Name: Date of Service: Dipierro, Odetta G. 9/17/2024andnbsp11:00 A M Medical Record Number: [id number] Patient Account Number: [id number] Date of Birth/Gender: Treating RN: 07-29-1931 (89 y.o. [name] Primary Care Physician: [name] Other Clinician: Referring Physician: Treating Physician/Extender: [name] in Treatment: 7 Education Assessment Education Provided To: Patient Education Topics Provided Wound/Skin Impairment: Handouts: Caring for Your Ulcer Methods: Explain/Verbal Responses: Reinforcements needed Electronic Signature(s) Signed: 08/20/2023 5:50:35 PM By: [name] RN, BSN Entered By: [name] on 08/20/2023 08:45:28 -------------------------------------------------------------------------------- Wound Assessment Details Patient Name: Date of Service: [name] 08/20/2023 11:00 A M Medical Record Number: [id number] Patient Account Number: [id number] Date of Birth/Sex: Treating RN: 02/12/1931 (89 y.o. F) Primary Care Zuha Dejonge: [name] Other Clinician: NAKENYA, MELLER ([id number]) [id number] Page 7 of 8 Referring Vernecia Umble: Treating Graciano Batson/Extender: [name] in Treatment: 7 Wound Status Wound Number: 12 Primary Diabetic Wound/Ulcer of the Lower Extremity Etiology: Wound Location: Left, Distal, Anterior Lower Leg Secondary Lymphedema Wounding Event: Blister Etiology: Date Acquired: 06/12/2023 Wound Open Weeks Of Treatment: 7 Status: Clustered Wound: No Comorbid Anemia, Congestive Heart Failure, Hypertension, Peripheral History: Venous Disease, Type II Diabetes, Osteoarthritis, Neuropathy Photos Wound Measurements Length: (cm) 1 Width: (cm) 0.3 Depth: (cm) 0.1 Area: (cm) 0.236 Volume:  (cm) 0.024 % Reduction in Area: 93.8% % Reduction in Volume: 93.7% Epithelialization: Medium (34-66%) Tunneling: No Undermining: No Wound Description Classification: Grade 1 Wound Margin: Distinct, outline attached Exudate Amount: Medium Exudate Type: Serosanguineous Exudate Color: red, brown Foul Odor After Cleansing: No Slough/Fibrino Yes Wound Bed Granulation Amount: Large (67-100%) Exposed Structure Granulation Quality: Pink, Pale  ANEZKA, STILTNER ([id number]) [id number] Page 1 of 8 Visit Report for 08/20/2023 Arrival Information Details Patient Name: Date of Service: AKIVA, ONDERKO 08/20/2023 11:00 A M Medical Record Number: [id number] Patient Account Number: [id number] Date of Birth/Sex: Treating RN: [birth year] (89 y.o. F) Primary Care Merrill Deanda: [name] Other Clinician: Referring Noemie Devivo: Treating Rhodesia Stanger/Extender: [name] in Treatment: 7 Visit Information History Since Last Visit Added or deleted any medications: No Patient Arrived: [name] Any new allergies or adverse reactions: No Arrival Time: 11:14 Had a fall or experienced change in No Accompanied By: friend activities of daily living that may affect Transfer Assistance: None risk of falls: Patient Identification Verified: Yes Signs or symptoms of abuse/neglect since last visito No Secondary Verification Process Completed: Yes Hospitalized since last visit: No Patient Requires Transmission-Based Precautions: No Implantable device outside of the clinic excluding No Patient Has Alerts: Yes cellular tissue based products placed in the center Patient Alerts: ABIs: R:0.92 L:0.91 8/24 since last visit: Has Dressing in Place as Prescribed: Yes Has Compression in Place as Prescribed: Yes Pain Present Now: No Electronic Signature(s) Signed: 08/23/2023 12:48:07 PM By: Thayer Dallas Entered By: Thayer Dallas on 08/20/2023 08:15:08 -------------------------------------------------------------------------------- Encounter Discharge Information Details Patient Name: Date of Service: [name] 08/20/2023 11:00 A M Medical Record Number: [id number] Patient Account Number: [id number] Date of Birth/Sex: Treating RN: 02-Jul-1931 (89 y.o. [name] Primary Care Kerie Badger: [name] Other Clinician: Referring Dahna Hattabaugh: Treating Geral Coker/Extender: [name] in Treatment: 7 Encounter Discharge Information Items Post Procedure Vitals Discharge Condition: Stable Temperature (F): 98.3 Ambulatory Status: Wheelchair Pulse (bpm): 54 Discharge Destination: Home Respiratory Rate (breaths/min): 18 Transportation: Private Auto Blood Pressure (mmHg): 111/53 Accompanied By: friend Schedule Follow-up Appointment: Yes Clinical Summary of Care: Electronic Signature(s) Signed: 08/20/2023 5:50:35 PM By: [name] RN, BSN Entered By: [name] on 08/20/2023 08:48:21 [name] ([id number]) [id number] Page 2 of 8 -------------------------------------------------------------------------------- Lower Extremity Assessment Details Patient Name: Date of Service: URENNA, [place] 08/20/2023 11:00 A M Medical Record Number: [id number] Patient Account Number: [id number] Date of Birth/Sex: Treating RN: [birth year] (89 y.o. F) Primary Care Wes Lezotte: [name] Other Clinician: Referring Tylia Ewell: Treating Leonard Feigel/Extender: [name] in Treatment: 7 Edema Assessment Assessed: [Left: No] [Right: No] Edema: [Left: Ye] [Right: s] Calf Left: Right: Point of Measurement: 32 cm From Medial Instep 43 cm Ankle Left: Right: Point of Measurement: 11 cm From Medial Instep 22.5 cm Vascular Assessment Extremity colors, hair growth, and conditions: Extremity Color: [Left:Normal] Hair Growth on Extremity: [Left:Yes] Temperature of Extremity: [Left:Hot] Capillary Refill: [Left:< 3 seconds] Dependent Rubor: [Left:No No] Electronic Signature(s) Signed: 08/23/2023 12:48:07 PM By: Thayer Dallas Entered By: Thayer Dallas on 08/20/2023 08:27:33 -------------------------------------------------------------------------------- Multi Wound Chart Details Patient Name: Date of Service: [name] 08/20/2023 11:00 A M Medical Record Number:  [id number] Patient Account Number: [id number] Date of Birth/Sex: Treating RN: 12-04-1930 (89 y.o. F) Primary Care Faheem Ziemann: [name] Other Clinician: Referring Mi Balla: Treating Lamiracle Chaidez/Extender: [name] in Treatment: 7 Vital Signs Height(in): 62 Pulse(bpm): 54 Weight(lbs): 220 Blood Pressure(mmHg): 111/53 Body Mass Index(BMI): 40.2 Temperature(F): 98.3 Respiratory Rate(breaths/min): 18 [12:Photos:] [N/A:N/A] Left, Distal, Anterior Lower Leg N/A N/A Wound Location: Blister N/A N/A Wounding Event: 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 Lymphedema N/A N/A Secondary Etiology: Anemia, Congestive Heart Failure, N/A N/A Comorbid History: Hypertension, Peripheral Venous Disease, Type II Diabetes, Osteoarthritis, Neuropathy 06/12/2023 N/A N/A  ANEZKA, STILTNER ([id number]) [id number] Page 1 of 8 Visit Report for 08/20/2023 Arrival Information Details Patient Name: Date of Service: AKIVA, ONDERKO 08/20/2023 11:00 A M Medical Record Number: [id number] Patient Account Number: [id number] Date of Birth/Sex: Treating RN: [birth year] (89 y.o. F) Primary Care Merrill Deanda: [name] Other Clinician: Referring Noemie Devivo: Treating [place] Stanger/Extender: [name] in Treatment: 7 Visit Information History Since Last Visit Added or deleted any medications: No Patient Arrived: [name] Any new allergies or adverse reactions: No Arrival Time: 11:14 Had a fall or experienced change in No Accompanied By: friend activities of daily living that may affect Transfer Assistance: None risk of falls: Patient Identification Verified: Yes Signs or symptoms of abuse/neglect since last visito No Secondary Verification Process Completed: Yes Hospitalized since last visit: No Patient Requires Transmission-Based Precautions: No Implantable device outside of the clinic excluding No Patient Has Alerts: Yes cellular tissue based products placed in the center Patient Alerts: ABIs: R:0.92 L:0.91 8/24 since last visit: Has Dressing in Place as Prescribed: Yes Has Compression in Place as Prescribed: Yes Pain Present Now: No Electronic Signature(s) Signed: 08/23/2023 12:48:07 PM By: Thayer Dallas Entered By: Thayer Dallas on 08/20/2023 08:15:08 -------------------------------------------------------------------------------- Encounter Discharge Information Details Patient Name: Date of Service: [name] 08/20/2023 11:00 A M Medical Record Number: [id number] Patient Account Number: [id number] Date of Birth/Sex: Treating RN: 02-Jul-1931 (89 y.o. [name] Primary Care Kerie Badger: [name] Other Clinician: Referring Dahna Hattabaugh: Treating Geral Coker/Extender: [name] in Treatment: 7 Encounter Discharge Information Items Post Procedure Vitals Discharge Condition: Stable Temperature (F): 98.3 Ambulatory Status: Wheelchair Pulse (bpm): 54 Discharge Destination: Home Respiratory Rate (breaths/min): 18 Transportation: Private Auto Blood Pressure (mmHg): 111/53 Accompanied By: friend Schedule Follow-up Appointment: Yes Clinical Summary of Care: Electronic Signature(s) Signed: 08/20/2023 5:50:35 PM By: [name] RN, BSN Entered By: [name] on 08/20/2023 08:48:21 [name] ([id number]) [id number] Page 2 of 8 -------------------------------------------------------------------------------- Lower Extremity Assessment Details Patient Name: Date of Service: URENNA, [place] 08/20/2023 11:00 A M Medical Record Number: [id number] Patient Account Number: [id number] Date of Birth/Sex: Treating RN: [birth year] (89 y.o. F) Primary Care Wes Lezotte: [name] Other Clinician: Referring Tylia Ewell: Treating Leonard Feigel/Extender: [name] in Treatment: 7 Edema Assessment Assessed: [Left: No] [Right: No] Edema: [Left: Ye] [Right: s] Calf Left: Right: Point of Measurement: 32 cm From Medial Instep 43 cm Ankle Left: Right: Point of Measurement: 11 cm From Medial Instep 22.5 cm Vascular Assessment Extremity colors, hair growth, and conditions: Extremity Color: [Left:Normal] Hair Growth on Extremity: [Left:Yes] Temperature of Extremity: [Left:Hot] Capillary Refill: [Left:< 3 seconds] Dependent Rubor: [Left:No No] Electronic Signature(s) Signed: 08/23/2023 12:48:07 PM By: Thayer Dallas Entered By: Thayer Dallas on 08/20/2023 08:27:33 -------------------------------------------------------------------------------- Multi Wound Chart Details Patient Name: Date of Service: [name] 08/20/2023 11:00 A M Medical Record Number:  [id number] Patient Account Number: [id number] Date of Birth/Sex: Treating RN: 12-04-1930 (89 y.o. F) Primary Care Faheem Ziemann: [name] Other Clinician: Referring Mi Balla: Treating Lamiracle Chaidez/Extender: [name] in Treatment: 7 Vital Signs Height(in): 62 Pulse(bpm): 54 Weight(lbs): 220 Blood Pressure(mmHg): 111/53 Body Mass Index(BMI): 40.2 Temperature(F): 98.3 Respiratory Rate(breaths/min): 18 [12:Photos:] [N/A:N/A] Left, Distal, Anterior Lower Leg N/A N/A Wound Location: Blister N/A N/A Wounding Event: 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 Lymphedema N/A N/A Secondary Etiology: Anemia, Congestive Heart Failure, N/A N/A Comorbid History: Hypertension, Peripheral Venous Disease, Type II Diabetes, Osteoarthritis, Neuropathy 06/12/2023 N/A N/A  wound healing Provide education on nutrition Treatment Activities: Patient referred to Primary Care Physician for further nutritional evaluation : 07/02/2023 SADIAH, BAEZ ([id number]) [id number] Page 5 of 8 Notes: Pain, Acute or Chronic Nursing Diagnoses: Pain, acute or chronic: actual or potential Potential alteration in comfort, pain Goals: Patient will verbalize adequate pain control and receive pain control interventions during procedures as needed Date Initiated: 07/02/2023 Date Inactivated: 07/30/2023 Target Resolution Date: 08/02/2023 Goal  Status: Met Patient/caregiver will verbalize comfort level met Date Initiated: 07/02/2023 Target Resolution Date: 08/30/2023 Goal Status: Active Interventions: Encourage patient to take pain medications as prescribed Provide education on pain management Reposition patient for comfort Treatment Activities: Administer pain control measures as ordered : 07/02/2023 Notes: Wound/Skin Impairment Nursing Diagnoses: Knowledge deficit related to ulceration/compromised skin integrity Goals: Patient/caregiver will verbalize understanding of skin care regimen Date Initiated: 07/02/2023 Target Resolution Date: 08/30/2023 Goal Status: Active Interventions: Assess patient/caregiver ability to perform ulcer/skin care regimen upon admission and as needed Assess ulceration(s) every visit Provide education on ulcer and skin care Treatment Activities: Skin care regimen initiated : 07/02/2023 Topical wound management initiated : 07/02/2023 Notes: Electronic Signature(s) Signed: 08/20/2023 5:50:35 PM By: [name] RN, BSN Entered By: [name] on 08/20/2023 08:44:43 -------------------------------------------------------------------------------- Pain Assessment Details Patient Name: Date of Service: [name] 08/20/2023 11:00 A M Medical Record Number: [id number] Patient Account Number: [id number] Date of Birth/Sex: Treating RN: [birth year] (89 y.o. F) Primary Care Eliza Green: [name] Other Clinician: Referring Rasheedah Reis: Treating Dontrelle Mazon/Extender: [name] in Treatment: 7 Active Problems Location of Pain Severity and Description of Pain Patient Has Paino No Site Locations [place], [place] G ([id number]) [id number] Page 6 of 8 Pain Management and Medication Current Pain Management: Electronic Signature(s) Signed: 08/23/2023 12:48:07 PM By: Thayer Dallas Entered By: Thayer Dallas on 08/20/2023  08:27:14 -------------------------------------------------------------------------------- Patient/Caregiver Education Details Patient Name: Date of Service: Dipierro, Odetta G. 9/17/2024andnbsp11:00 A M Medical Record Number: [id number] Patient Account Number: [id number] Date of Birth/Gender: Treating RN: 07-29-1931 (89 y.o. [name] Primary Care Physician: [name] Other Clinician: Referring Physician: Treating Physician/Extender: [name] in Treatment: 7 Education Assessment Education Provided To: Patient Education Topics Provided Wound/Skin Impairment: Handouts: Caring for Your Ulcer Methods: Explain/Verbal Responses: Reinforcements needed Electronic Signature(s) Signed: 08/20/2023 5:50:35 PM By: [name] RN, BSN Entered By: [name] on 08/20/2023 08:45:28 -------------------------------------------------------------------------------- Wound Assessment Details Patient Name: Date of Service: [name] 08/20/2023 11:00 A M Medical Record Number: [id number] Patient Account Number: [id number] Date of Birth/Sex: Treating RN: 02/12/1931 (89 y.o. F) Primary Care Zuha Dejonge: [name] Other Clinician: NAKENYA, MELLER ([id number]) [id number] Page 7 of 8 Referring Vernecia Umble: Treating Graciano Batson/Extender: [name] in Treatment: 7 Wound Status Wound Number: 12 Primary Diabetic Wound/Ulcer of the Lower Extremity Etiology: Wound Location: Left, Distal, Anterior Lower Leg Secondary Lymphedema Wounding Event: Blister Etiology: Date Acquired: 06/12/2023 Wound Open Weeks Of Treatment: 7 Status: Clustered Wound: No Comorbid Anemia, Congestive Heart Failure, Hypertension, Peripheral History: Venous Disease, Type II Diabetes, Osteoarthritis, Neuropathy Photos Wound Measurements Length: (cm) 1 Width: (cm) 0.3 Depth: (cm) 0.1 Area: (cm) 0.236 Volume:  (cm) 0.024 % Reduction in Area: 93.8% % Reduction in Volume: 93.7% Epithelialization: Medium (34-66%) Tunneling: No Undermining: No Wound Description Classification: Grade 1 Wound Margin: Distinct, outline attached Exudate Amount: Medium Exudate Type: Serosanguineous Exudate Color: red, brown Foul Odor After Cleansing: No Slough/Fibrino Yes Wound Bed Granulation Amount: Large (67-100%) Exposed Structure Granulation Quality: Pink, Pale

## 2023-08-27 ENCOUNTER — Encounter (HOSPITAL_BASED_OUTPATIENT_CLINIC_OR_DEPARTMENT_OTHER): Payer: Medicare PPO | Admitting: Internal Medicine

## 2023-08-27 DIAGNOSIS — E11622 Type 2 diabetes mellitus with other skin ulcer: Secondary | ICD-10-CM | POA: Diagnosis not present

## 2023-08-28 NOTE — Progress Notes (Signed)
[name], [name] ([id number]) [phone].pdf Page 1 of 8 Visit Report for 08/27/2023 Arrival Information Details Patient Name: Date of Service: [name], [name] 08/27/2023 11:00 A M Medical Record Number: [id number] Patient Account Number: [id number] Date of Birth/Sex: Treating RN: September 11, 1931 (89 y.o. [name] Primary Care Aldonia Keeven: [name] Other Clinician: Referring Kileen Lange: Treating Ladelle Teodoro/Extender: [name] in Treatment: 8 Visit Information History Since Last Visit Added or deleted any medications: No Patient Arrived: [name] Any new allergies or adverse reactions: No Arrival Time: 10:55 Had a fall or experienced change in No Accompanied By: Friend activities of daily living that may affect Transfer Assistance: None risk of falls: Patient Identification Verified: Yes Signs or symptoms of abuse/neglect since last visito No Secondary Verification Process Completed: Yes Hospitalized since last visit: No Patient Requires Transmission-Based Precautions: No Implantable device outside of the clinic excluding No Patient Has Alerts: Yes cellular tissue based products placed in the center Patient Alerts: ABIs: R:0.92 L:0.91 8/24 since last visit: Has Dressing in Place as Prescribed: Yes Has Compression in Place as Prescribed: Yes Pain Present Now: No Electronic Signature(s) Signed: 08/28/2023 2:21:02 PM By: Redmond Pulling RN, BSN Entered By: Redmond Pulling on 08/27/2023 10:55:35 -------------------------------------------------------------------------------- Encounter Discharge Information Details Patient Name: Date of Service: [name] 08/27/2023 11:00 A M Medical Record Number: [id number] Patient Account Number: [id number] Date of Birth/Sex: Treating RN: 1930-12-18 (89 y.o. [name] Primary Care Quinntin Malter: [name] Other Clinician: Referring Donatella Walski: Treating  Craige Patel/Extender: [name] in Treatment: 8 Encounter Discharge Information Items Post Procedure Vitals Discharge Condition: Stable Temperature (F): 98.2 Ambulatory Status: Ambulatory Pulse (bpm): 59 Discharge Destination: Home Respiratory Rate (breaths/min): 20 Transportation: Private Auto Blood Pressure (mmHg): 117/44 Accompanied By: friend Schedule Follow-up Appointment: Yes Clinical Summary of Care: Electronic Signature(s) Signed: 08/27/2023 5:19:03 PM By: [name] RN, BSN Entered By: [name] on 08/27/2023 11:19:32 [name] ([id number]) [id number].pdf Page 2 of 8 -------------------------------------------------------------------------------- Lower Extremity Assessment Details Patient Name: Date of Service: [name], [name] 08/27/2023 11:00 A M Medical Record Number: [id number] Patient Account Number: [id number] Date of Birth/Sex: Treating RN: [birth year] (89 y.o. [name] Primary Care Abbigail Anstey: [name] Other Clinician: Referring Shamanda Len: Treating Jamilah Jean/Extender: [name] in Treatment: 8 Edema Assessment Assessed: [Left: No] [Right: No] Edema: [Left: Ye] [Right: s] Calf Left: Right: Point of Measurement: 32 cm From Medial Instep 43 cm 40 cm Ankle Left: Right: Point of Measurement: 11 cm From Medial Instep 21.7 cm 21 cm Knee To Floor Left: Right: From Medial Instep 38 cm 38 cm Vascular Assessment Pulses: Dorsalis Pedis Palpable: [Left:Yes] Extremity colors, hair growth, and conditions: Extremity Color: [Left:Normal] Hair Growth on Extremity: [Left:Yes] Temperature of Extremity: [Left:Hot] Capillary Refill: [Left:< 3 seconds] Dependent Rubor: [Left:No No] Electronic Signature(s) Signed: 08/27/2023 5:19:03 PM By: [name] RN, BSN Signed: 08/28/2023 2:21:02 PM By: Redmond Pulling RN, BSN Entered By: [name] on 08/27/2023  11:16:03 -------------------------------------------------------------------------------- Multi Wound Chart Details Patient Name: Date of Service: [name] 08/27/2023 11:00 A M Medical Record Number: [id number] Patient Account Number: [id number] Date of Birth/Sex: Treating RN: [birth year] (89 y.o. 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 Lymphedema N/A N/A Secondary Etiology: Anemia, Congestive Heart Failure, N/A N/A Comorbid History: Hypertension, Peripheral Venous Disease, Type II Diabetes, Osteoarthritis, Neuropathy 06/12/2023 N/A N/A Date Acquired: 8 N/A N/A Weeks of Treatment: Open N/A N/A Wound Status: No N/A N/A Wound Recurrence: 0.4x0.3x0.1 N/A N/A Measurements L x W x D (cm) 0.094 N/A N/A A (cm) : rea 0.009 N/A N/A Volume (cm) : 97.50% N/A N/A % Reduction in A rea: 97.60% N/A N/A % Reduction in Volume: Grade 1 N/A N/A Classification: Medium N/A N/A Exudate A mount: Serosanguineous N/A N/A Exudate Type: red, brown N/A N/A Exudate Color: Distinct, outline attached N/A N/A Wound Margin: None Present (0%) N/A N/A Granulation A mount: Large (67-100%) N/A N/A Necrotic A mount: Fascia: No N/A N/A Exposed Structures: Fat Layer (Subcutaneous Tissue): No Tendon: No Muscle: No Joint: No Bone: No Large (67-100%) N/A N/A Epithelialization: Debridement - Excisional N/A  N/A Debridement: Pre-procedure Verification/Time Out 11:05 N/A N/A Taken: Lidocaine 4% Topical Solution N/A N/A Pain Control: Subcutaneous, Slough N/A N/A Tissue Debrided: Skin/Subcutaneous Tissue N/A N/A Level: 0.09 N/A N/A Debridement A (sq cm): rea Curette N/A N/A Instrument: Minimum N/A N/A Bleeding: Pressure N/A N/A Hemostasis A chieved: 0 N/A N/A Procedural Pain: 0 N/A N/A Post Procedural Pain: Procedure was tolerated well N/A N/A Debridement Treatment Response: 0.4x0.3x0.1 N/A N/A Post Debridement Measurements L x W x D (cm) 0.009 N/A N/A Post Debridement Volume: (cm) Scarring: Yes N/A N/A Periwound Skin Texture: Excoriation: No Induration: No Callus: No Crepitus: No Rash: No Maceration: No N/A N/A Periwound Skin Moisture: Dry/Scaly: No Hemosiderin Staining: Yes N/A N/A Periwound Skin Color: Atrophie Blanche: No Cyanosis: No Ecchymosis: No Erythema: No Mottled: No Pallor: No Rubor: No No Abnormality N/A N/A Temperature: Debridement N/A N/A Procedures Performed: Treatment Notes Electronic Signature(sISREAL, [name] ([id number]) [id number].pdf Page 4 of 8 Signed: 08/27/2023 4:52:49 PM By: Baltazar Najjar MD Entered By: Baltazar Najjar on 08/27/2023 11:15:43 -------------------------------------------------------------------------------- Multi-Disciplinary Care Plan Details Patient Name: Date of Service: [name] 08/27/2023 11:00 A M Medical Record Number: [id number] Patient Account Number: [id number] Date of Birth/Sex: Treating RN: 01-Mar-1931 (89 y.o. [name], [name] Primary Care Amily Depp: [name] Other Clinician: Referring Crescent Gotham: Treating Kalyn Dimattia/Extender: [name] in Treatment: 8 Active Inactive Nutrition Nursing Diagnoses: Potential for alteratiion in Nutrition/Potential for imbalanced nutrition Goals: Patient/caregiver agrees to and verbalizes  understanding of need to obtain nutritional consultation Date Initiated: 07/02/2023 Date Inactivated: 07/30/2023 Target Resolution Date: 08/02/2023 Goal Status: Met Patient/caregiver will maintain therapeutic glucose control Date Initiated: 07/02/2023 Target Resolution Date: 08/30/2023 Goal Status: Active Interventions: Assess HgA1c results as ordered upon admission and as needed Provide education on elevated blood sugars and impact on wound healing Provide education on nutrition Treatment Activities: Patient referred to Primary Care Physician for further nutritional evaluation : 07/02/2023 Notes: Pain, Acute or Chronic Nursing Diagnoses: Pain, acute or chronic: actual or potential Potential alteration in comfort, pain Goals: Patient will verbalize adequate pain control and receive pain control interventions during procedures as needed Date Initiated: 07/02/2023 Date Inactivated: 07/30/2023 Target Resolution Date: 08/02/2023 Goal Status: Met Patient/caregiver will verbalize comfort level met Date Initiated: 07/02/2023 Target Resolution Date: 08/30/2023 Goal Status: Active Interventions: Encourage patient to take pain medications as prescribed Provide education on pain management Reposition patient for comfort Treatment Activities: Administer pain control measures as ordered : 07/02/2023 Notes: Wound/Skin Impairment  Anterior Lower Leg N/A N/A Wound Location: Blister N/A N/A Wounding Event: 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 Lymphedema N/A N/A Secondary Etiology: Anemia, Congestive Heart Failure, N/A N/A Comorbid History: Hypertension, Peripheral Venous Disease, Type II Diabetes, Osteoarthritis, Neuropathy 06/12/2023 N/A N/A Date Acquired: 8 N/A N/A Weeks of Treatment: Open N/A N/A Wound Status: No N/A N/A Wound Recurrence: 0.4x0.3x0.1 N/A N/A Measurements L x W x D (cm) 0.094 N/A N/A A (cm) : rea 0.009 N/A N/A Volume (cm) : 97.50% N/A N/A % Reduction in A rea: 97.60% N/A N/A % Reduction in Volume: Grade 1 N/A N/A Classification: Medium N/A N/A Exudate A mount: Serosanguineous N/A N/A Exudate Type: red, brown N/A N/A Exudate Color: Distinct, outline attached N/A N/A Wound Margin: None Present (0%) N/A N/A Granulation A mount: Large (67-100%) N/A N/A Necrotic A mount: Fascia: No N/A N/A Exposed Structures: Fat Layer (Subcutaneous Tissue): No Tendon: No Muscle: No Joint: No Bone: No Large (67-100%) N/A N/A Epithelialization: Debridement - Excisional N/A  N/A Debridement: Pre-procedure Verification/Time Out 11:05 N/A N/A Taken: Lidocaine 4% Topical Solution N/A N/A Pain Control: Subcutaneous, Slough N/A N/A Tissue Debrided: Skin/Subcutaneous Tissue N/A N/A Level: 0.09 N/A N/A Debridement A (sq cm): rea Curette N/A N/A Instrument: Minimum N/A N/A Bleeding: Pressure N/A N/A Hemostasis A chieved: 0 N/A N/A Procedural Pain: 0 N/A N/A Post Procedural Pain: Procedure was tolerated well N/A N/A Debridement Treatment Response: 0.4x0.3x0.1 N/A N/A Post Debridement Measurements L x W x D (cm) 0.009 N/A N/A Post Debridement Volume: (cm) Scarring: Yes N/A N/A Periwound Skin Texture: Excoriation: No Induration: No Callus: No Crepitus: No Rash: No Maceration: No N/A N/A Periwound Skin Moisture: Dry/Scaly: No Hemosiderin Staining: Yes N/A N/A Periwound Skin Color: Atrophie Blanche: No Cyanosis: No Ecchymosis: No Erythema: No Mottled: No Pallor: No Rubor: No No Abnormality N/A N/A Temperature: Debridement N/A N/A Procedures Performed: Treatment Notes Electronic Signature(sISREAL, [name] ([id number]) [id number].pdf Page 4 of 8 Signed: 08/27/2023 4:52:49 PM By: Baltazar Najjar MD Entered By: Baltazar Najjar on 08/27/2023 11:15:43 -------------------------------------------------------------------------------- Multi-Disciplinary Care Plan Details Patient Name: Date of Service: [name] 08/27/2023 11:00 A M Medical Record Number: [id number] Patient Account Number: [id number] Date of Birth/Sex: Treating RN: 01-Mar-1931 (89 y.o. [name], [name] Primary Care Amily Depp: [name] Other Clinician: Referring Crescent Gotham: Treating Kalyn Dimattia/Extender: [name] in Treatment: 8 Active Inactive Nutrition Nursing Diagnoses: Potential for alteratiion in Nutrition/Potential for imbalanced nutrition Goals: Patient/caregiver agrees to and verbalizes  understanding of need to obtain nutritional consultation Date Initiated: 07/02/2023 Date Inactivated: 07/30/2023 Target Resolution Date: 08/02/2023 Goal Status: Met Patient/caregiver will maintain therapeutic glucose control Date Initiated: 07/02/2023 Target Resolution Date: 08/30/2023 Goal Status: Active Interventions: Assess HgA1c results as ordered upon admission and as needed Provide education on elevated blood sugars and impact on wound healing Provide education on nutrition Treatment Activities: Patient referred to Primary Care Physician for further nutritional evaluation : 07/02/2023 Notes: Pain, Acute or Chronic Nursing Diagnoses: Pain, acute or chronic: actual or potential Potential alteration in comfort, pain Goals: Patient will verbalize adequate pain control and receive pain control interventions during procedures as needed Date Initiated: 07/02/2023 Date Inactivated: 07/30/2023 Target Resolution Date: 08/02/2023 Goal Status: Met Patient/caregiver will verbalize comfort level met Date Initiated: 07/02/2023 Target Resolution Date: 08/30/2023 Goal Status: Active Interventions: Encourage patient to take pain medications as prescribed Provide education on pain management Reposition patient for comfort Treatment Activities: Administer pain control measures as ordered : 07/02/2023 Notes: Wound/Skin Impairment  [name], [name] ([id number]) [phone].pdf Page 1 of 8 Visit Report for 08/27/2023 Arrival Information Details Patient Name: Date of Service: [name], [name] 08/27/2023 11:00 A M Medical Record Number: [id number] Patient Account Number: [id number] Date of Birth/Sex: Treating RN: September 11, 1931 (89 y.o. [name] Primary Care Aldonia Keeven: [name] Other Clinician: Referring Kileen Lange: Treating Ladelle Teodoro/Extender: [name] in Treatment: 8 Visit Information History Since Last Visit Added or deleted any medications: No Patient Arrived: [name] Any new allergies or adverse reactions: No Arrival Time: 10:55 Had a fall or experienced change in No Accompanied By: Friend activities of daily living that may affect Transfer Assistance: None risk of falls: Patient Identification Verified: Yes Signs or symptoms of abuse/neglect since last visito No Secondary Verification Process Completed: Yes Hospitalized since last visit: No Patient Requires Transmission-Based Precautions: No Implantable device outside of the clinic excluding No Patient Has Alerts: Yes cellular tissue based products placed in the center Patient Alerts: ABIs: R:0.92 L:0.91 8/24 since last visit: Has Dressing in Place as Prescribed: Yes Has Compression in Place as Prescribed: Yes Pain Present Now: No Electronic Signature(s) Signed: 08/28/2023 2:21:02 PM By: Redmond Pulling RN, BSN Entered By: Redmond Pulling on 08/27/2023 10:55:35 -------------------------------------------------------------------------------- Encounter Discharge Information Details Patient Name: Date of Service: [name] 08/27/2023 11:00 A M Medical Record Number: [id number] Patient Account Number: [id number] Date of Birth/Sex: Treating RN: 1930-12-18 (89 y.o. [name] Primary Care Quinntin Malter: [name] Other Clinician: Referring Donatella Walski: Treating  Craige Patel/Extender: [name] in Treatment: 8 Encounter Discharge Information Items Post Procedure Vitals Discharge Condition: Stable Temperature (F): 98.2 Ambulatory Status: Ambulatory Pulse (bpm): 59 Discharge Destination: Home Respiratory Rate (breaths/min): 20 Transportation: Private Auto Blood Pressure (mmHg): 117/44 Accompanied By: friend Schedule Follow-up Appointment: Yes Clinical Summary of Care: Electronic Signature(s) Signed: 08/27/2023 5:19:03 PM By: [name] RN, BSN Entered By: [name] on 08/27/2023 11:19:32 [name] ([id number]) [id number] Page 2 of 8 -------------------------------------------------------------------------------- Lower Extremity Assessment Details Patient Name: Date of Service: [name], [name] 08/27/2023 11:00 A M Medical Record Number: [id number] Patient Account Number: [id number] Date of Birth/Sex: Treating RN: [birth year] (89 y.o. [name] Primary Care Abbigail Anstey: [name] Other Clinician: Referring Shamanda Len: Treating Jamilah Jean/Extender: [name] in Treatment: 8 Edema Assessment Assessed: [Left: No] [Right: No] Edema: [Left: Ye] [Right: s] Calf Left: Right: Point of Measurement: 32 cm From Medial Instep 43 cm 40 cm Ankle Left: Right: Point of Measurement: 11 cm From Medial Instep 21.7 cm 21 cm Knee To Floor Left: Right: From Medial Instep 38 cm 38 cm Vascular Assessment Pulses: Dorsalis Pedis Palpable: [Left:Yes] Extremity colors, hair growth, and conditions: Extremity Color: [Left:Normal] Hair Growth on Extremity: [Left:Yes] Temperature of Extremity: [Left:Hot] Capillary Refill: [Left:< 3 seconds] Dependent Rubor: [Left:No No] Electronic Signature(s) Signed: 08/27/2023 5:19:03 PM By: [name] RN, BSN Signed: 08/28/2023 2:21:02 PM By: Redmond Pulling RN, BSN Entered By: [name] on 08/27/2023  11:16:03 -------------------------------------------------------------------------------- Multi Wound Chart Details Patient Name: Date of Service: [name] 08/27/2023 11:00 A M Medical Record Number: [id number] Patient Account Number: [id number] Date of Birth/Sex: Treating RN: [birth year] (89 y.o. F) Primary Care Yi Haugan: [name] Other Clinician: Referring Melisssa Donner: Treating Sarahmarie Leavey/Extender: [name] in Treatment: 8 Vital Signs Height(in): 62 Pulse(bpm): 59 Weight(lbs): 220 Blood Pressure(mmHg): 117/44 Body Mass Index(BMI): 40.2 Temperature(F): 98.2 Respiratory Rate(breaths/min): 20 [name], [name] ([id number]) [id number] Page 3 of 8 [12:Photos:] [N/A:N/A] Left, Distal,

## 2023-08-28 NOTE — Progress Notes (Signed)
Katherine, [name] ([id number]) [id number] Page 1 of 7 Visit Report for 08/27/2023 Debridement Details Patient Name: Date of Service: [name], [name] 08/27/2023 11:00 A M Medical Record Number: [id number] Patient Account Number: [id number] Date of Birth/Sex: Treating RN: [birth year] (89 y.o. F) Primary Care Provider: [name] Other Clinician: Referring Provider: Treating Provider/Extender: [name] in Treatment: 8 Debridement Performed for Assessment: Wound #12 Left,Distal,Anterior Lower Leg Performed By: Physician Maxwell Caul., MD The following information was scribed by: [name] The information was scribed for: [name] Debridement Type: Debridement Severity of Tissue Pre Debridement: Fat layer exposed Level of Consciousness (Pre-procedure): Awake and Alert Pre-procedure Verification/Time Out Yes - 11:05 Taken: Start Time: 11:06 Pain Control: Lidocaine 4% T opical Solution Percent of Wound Bed Debrided: 100% T Area Debrided (cm): otal 0.09 Tissue and other material debrided: Viable, Non-Viable, Slough, Subcutaneous, Skin: Dermis , Skin: Epidermis, Slough Level: Skin/Subcutaneous Tissue Debridement Description: Excisional Instrument: Curette Bleeding: Minimum Hemostasis Achieved: Pressure End Time: 11:11 Procedural Pain: 0 Post Procedural Pain: 0 Response to Treatment: Procedure was tolerated well Level of Consciousness (Post- Awake and Alert procedure): Post Debridement Measurements of Total Wound Length: (cm) 0.4 Width: (cm) 0.3 Depth: (cm) 0.1 Volume: (cm) 0.009 Character of Wound/Ulcer Post Debridement: Improved Severity of Tissue Post Debridement: Fat layer exposed Post Procedure Diagnosis Same as Pre-procedure Electronic Signature(s) Signed: 08/27/2023 4:52:49 PM By: Katherine Najjar MD Entered By: [name] on 08/27/2023  11:15:54 -------------------------------------------------------------------------------- HPI Details Patient Name: Date of Service: [name] 08/27/2023 11:00 A M Medical Record Number: [id number] Patient Account Number: [id number] Date of Birth/Sex: Treating RN: 1931/03/29 (89 y.o. F) Primary Care Provider: [name] Other Clinician: Referring Provider: Treating Provider/Extender: [name] in Treatment: [address] [place] Reece Agar ([id number]) [id number] Page 2 of 7 History of Present Illness HPI Description: 07/02/2023 Ms. 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 She is not quite sure how the right upper leg wound started. The left lower extremity wound started out as blisters. She has compression stockings but has not been wearing them. She takes Lasix 20 mg daily. She currently denies signs of infection. In office ABIs were 0.57 on the left however dorsalis pedal pulses and posterior tibialis pulses were heard on Doppler. 8/6; patient presents for follow-up. We have been using antibiotic ointment with Hydrofera Blue under Kerlix/Coban to the left lower extremity. Hydrofera Blue to the right lower leg. She came in for nurse visit and wrap change. She reports no issues. She reports that she is scheduled for her ABIs next week. 8/13; patient presents for follow-up. We have been using Hydrofera Blue and Santyl to the left lower extremity under Kerlix/Coban. She has been using Hydrofera Blue to the right upper leg wound. She had ABIs completed yesterday that showed an ABI of 0.91 on the left and 0.92 on the right. She has no issues or complaints today. 8/20; patient presents for  follow-up. We have been using Hydrofera Blue and Santyl to the left lower extremity under 3 layer compression and Hydrofera Blue to the right upper leg wound. The right upper leg wound has healed. She feels that the wrap was too tight with the 3 layer. We will go back down to Kerlix/Coban. Other than that she has  Katherine, [name] ([id number]) [id number] Page 1 of 7 Visit Report for 08/27/2023 Debridement Details Patient Name: Date of Service: [name], [name] 08/27/2023 11:00 A M Medical Record Number: [id number] Patient Account Number: [id number] Date of Birth/Sex: Treating RN: [birth year] (89 y.o. F) Primary Care Provider: [name] Other Clinician: Referring Provider: Treating Provider/Extender: [name] in Treatment: 8 Debridement Performed for Assessment: Wound #12 Left,Distal,Anterior Lower Leg Performed By: Physician Maxwell Caul., MD The following information was scribed by: [name] The information was scribed for: [name] Debridement Type: Debridement Severity of Tissue Pre Debridement: Fat layer exposed Level of Consciousness (Pre-procedure): Awake and Alert Pre-procedure Verification/Time Out Yes - 11:05 Taken: Start Time: 11:06 Pain Control: Lidocaine 4% T opical Solution Percent of Wound Bed Debrided: 100% T Area Debrided (cm): otal 0.09 Tissue and other material debrided: Viable, Non-Viable, Slough, Subcutaneous, Skin: Dermis , Skin: Epidermis, Slough Level: Skin/Subcutaneous Tissue Debridement Description: Excisional Instrument: Curette Bleeding: Minimum Hemostasis Achieved: Pressure End Time: 11:11 Procedural Pain: 0 Post Procedural Pain: 0 Response to Treatment: Procedure was tolerated well Level of Consciousness (Post- Awake and Alert procedure): Post Debridement Measurements of Total Wound Length: (cm) 0.4 Width: (cm) 0.3 Depth: (cm) 0.1 Volume: (cm) 0.009 Character of Wound/Ulcer Post Debridement: Improved Severity of Tissue Post Debridement: Fat layer exposed Post Procedure Diagnosis Same as Pre-procedure Electronic Signature(s) Signed: 08/27/2023 4:52:49 PM By: Katherine Najjar MD Entered By: [name] on 08/27/2023  11:15:54 -------------------------------------------------------------------------------- HPI Details Patient Name: Date of Service: [name] 08/27/2023 11:00 A M Medical Record Number: [id number] Patient Account Number: [id number] Date of Birth/Sex: Treating RN: 1931/03/29 (89 y.o. F) Primary Care Provider: [name] Other Clinician: Referring Provider: Treating Provider/Extender: [name] in Treatment: [address] [place] Reece Agar ([id number]) [id number] Page 2 of 7 History of Present Illness HPI Description: 07/02/2023 Ms. 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 She is not quite sure how the right upper leg wound started. The left lower extremity wound started out as blisters. She has compression stockings but has not been wearing them. She takes Lasix 20 mg daily. She currently denies signs of infection. In office ABIs were 0.57 on the left however dorsalis pedal pulses and posterior tibialis pulses were heard on Doppler. 8/6; patient presents for follow-up. We have been using antibiotic ointment with Hydrofera Blue under Kerlix/Coban to the left lower extremity. Hydrofera Blue to the right lower leg. She came in for nurse visit and wrap change. She reports no issues. She reports that she is scheduled for her ABIs next week. 8/13; patient presents for follow-up. We have been using Hydrofera Blue and Santyl to the left lower extremity under Kerlix/Coban. She has been using Hydrofera Blue to the right upper leg wound. She had ABIs completed yesterday that showed an ABI of 0.91 on the left and 0.92 on the right. She has no issues or complaints today. 8/20; patient presents for  follow-up. We have been using Hydrofera Blue and Santyl to the left lower extremity under 3 layer compression and Hydrofera Blue to the right upper leg wound. The right upper leg wound has healed. She feels that the wrap was too tight with the 3 layer. We will go back down to Kerlix/Coban. Other than that she has  3-4 times a day throughout the day. A void standing for long periods of time. Exercise regularly Compression stocking or Garment 30-40 mm/Hg pressure to: - [facility] will send you out two one per leg juxatlite HD. If compression wraps slide down please call wound center and speak with a nurse. Additional Orders / Instructions Follow Nutritious Diet - increase protein Juven Shake 1-2 times daily. Wound Treatment Wound #12 - Lower Leg Wound Laterality: Left, Anterior, Distal Cleanser: Soap and Water 1 x Per Week/30 Days Discharge Instructions: May shower and wash wound with dial antibacterial soap and water prior to dressing change. Cleanser: Vashe 5.8 (oz) 1 x Per Week/30 Days Discharge Instructions: Cleanse the wound with Vashe prior to applying a clean dressing using gauze sponges, not tissue or cotton balls. Peri-Wound Care: Sween Lotion (Moisturizing lotion) 1 x Per Week/30 Days Discharge Instructions: Apply moisturizing lotion as directed Prim Dressing: Hydrofera Blue Ready Transfer Foam, 2.5x2.5 (in/in) 1 x Per Week/30 Days ary Discharge Instructions: Apply directly to wound bed as directed Prim Dressing: Santyl Ointment 1 x Per Week/30 Days ary Discharge Instructions: Apply nickel thick amount to wound bed as instructed Secondary Dressing: ABD Pad, 8x10 1 x Per Week/30 Days Discharge Instructions: Apply over primary dressing  as directed. Secondary Dressing: Woven Gauze Sponge, Non-Sterile 4x4 in 1 x Per Week/30 Days Discharge Instructions: Apply over primary dressing as directed. Compression Wrap: Kerlix Roll 4.5x3.1 (in/yd) 1 x Per Week/30 Days Discharge Instructions: Apply Kerlix and Coban compression as directed. Compression Wrap: Coban Self-Adherent Wrap 4x5 (in/yd) 1 x Per Week/30 Days Discharge Instructions: Apply over Kerlix as directed. Electronic Signature(s) Signed: 08/27/2023 4:52:49 PM By: Katherine Najjar MD Signed: 08/27/2023 5:19:03 PM By: [name] RN, BSN [place], Signed: 08/27/2023 5:19:03 PM By: [name] RN, BSN [name] ([id number]) [id number] Page 4 of 7 Entered By: [name] on 08/27/2023 11:19:55 -------------------------------------------------------------------------------- Problem List Details Patient Name: Date of Service: IRIELLE, BERGLIN 08/27/2023 11:00 A M Medical Record Number: [id number] Patient Account Number: [id number] Date of Birth/Sex: Treating RN: 07-Sep-1931 (89 y.o. [name], [name] Primary Care Provider: [name] Other Clinician: Referring Provider: Treating Provider/Extender: [name] in Treatment: 8 Active Problems ICD-10 Encounter Code Description Active Date MDM Diagnosis 6181107607 Non-pressure chronic ulcer of other part of right lower leg with fat layer exposed 07/02/2023 No Yes L97.822 Non-pressure chronic ulcer of other part of left lower leg with fat layer exposed 07/02/2023 No Yes I89.0 Lymphedema, not elsewhere classified 07/02/2023 No Yes E11.622 Type 2 diabetes mellitus with other skin ulcer 07/02/2023 No Yes I50.42 Chronic combined systolic (congestive) and diastolic (congestive) heart failure 07/02/2023 No Yes Inactive Problems Resolved Problems Electronic Signature(s) Signed: 08/27/2023 4:52:49 PM By: Katherine Najjar MD Entered By: [name] on 08/27/2023  11:15:31 -------------------------------------------------------------------------------- Progress Note Details Patient Name: Date of Service: [name] 08/27/2023 11:00 A M Medical Record Number: [id number] Patient Account Number: [id number] Date of Birth/Sex: Treating RN: 02-06-1931 (89 y.o. F) Primary Care Provider: [name] Other Clinician: Referring Provider: Treating Provider/Extender: [name] in Treatment: 8 Subjective History of Present Illness (HPI) LEGNA, LASKOWSKI ([id number]) [id number] Page 5 of 7 07/02/2023 Ms. 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 She is not quite sure how the right upper leg  3-4 times a day throughout the day. A void standing for long periods of time. Exercise regularly Compression stocking or Garment 30-40 mm/Hg pressure to: - [facility] will send you out two one per leg juxatlite HD. If compression wraps slide down please call wound center and speak with a nurse. Additional Orders / Instructions Follow Nutritious Diet - increase protein Juven Shake 1-2 times daily. Wound Treatment Wound #12 - Lower Leg Wound Laterality: Left, Anterior, Distal Cleanser: Soap and Water 1 x Per Week/30 Days Discharge Instructions: May shower and wash wound with dial antibacterial soap and water prior to dressing change. Cleanser: Vashe 5.8 (oz) 1 x Per Week/30 Days Discharge Instructions: Cleanse the wound with Vashe prior to applying a clean dressing using gauze sponges, not tissue or cotton balls. Peri-Wound Care: Sween Lotion (Moisturizing lotion) 1 x Per Week/30 Days Discharge Instructions: Apply moisturizing lotion as directed Prim Dressing: Hydrofera Blue Ready Transfer Foam, 2.5x2.5 (in/in) 1 x Per Week/30 Days ary Discharge Instructions: Apply directly to wound bed as directed Prim Dressing: Santyl Ointment 1 x Per Week/30 Days ary Discharge Instructions: Apply nickel thick amount to wound bed as instructed Secondary Dressing: ABD Pad, 8x10 1 x Per Week/30 Days Discharge Instructions: Apply over primary dressing  as directed. Secondary Dressing: Woven Gauze Sponge, Non-Sterile 4x4 in 1 x Per Week/30 Days Discharge Instructions: Apply over primary dressing as directed. Compression Wrap: Kerlix Roll 4.5x3.1 (in/yd) 1 x Per Week/30 Days Discharge Instructions: Apply Kerlix and Coban compression as directed. Compression Wrap: Coban Self-Adherent Wrap 4x5 (in/yd) 1 x Per Week/30 Days Discharge Instructions: Apply over Kerlix as directed. Electronic Signature(s) Signed: 08/27/2023 4:52:49 PM By: Katherine Najjar MD Signed: 08/27/2023 5:19:03 PM By: [name] RN, BSN [place], Signed: 08/27/2023 5:19:03 PM By: [name] RN, BSN [name] ([id number]) [id number] Page 4 of 7 Entered By: [name] on 08/27/2023 11:19:55 -------------------------------------------------------------------------------- Problem List Details Patient Name: Date of Service: IRIELLE, BERGLIN 08/27/2023 11:00 A M Medical Record Number: [id number] Patient Account Number: [id number] Date of Birth/Sex: Treating RN: 07-Sep-1931 (89 y.o. [name], [name] Primary Care Provider: [name] Other Clinician: Referring Provider: Treating Provider/Extender: [name] in Treatment: 8 Active Problems ICD-10 Encounter Code Description Active Date MDM Diagnosis [id number] Non-pressure chronic ulcer of other part of right lower leg with fat layer exposed 07/02/2023 No Yes L97.822 Non-pressure chronic ulcer of other part of left lower leg with fat layer exposed 07/02/2023 No Yes I89.0 Lymphedema, not elsewhere classified 07/02/2023 No Yes E11.622 Type 2 diabetes mellitus with other skin ulcer 07/02/2023 No Yes I50.42 Chronic combined systolic (congestive) and diastolic (congestive) heart failure 07/02/2023 No Yes Inactive Problems Resolved Problems Electronic Signature(s) Signed: 08/27/2023 4:52:49 PM By: Katherine Najjar MD Entered By: [name] on 08/27/2023  11:15:31 -------------------------------------------------------------------------------- Progress Note Details Patient Name: Date of Service: [name] 08/27/2023 11:00 A M Medical Record Number: [id number] Patient Account Number: [id number] Date of Birth/Sex: Treating RN: 02-06-1931 (89 y.o. F) Primary Care Provider: [name] Other Clinician: Referring Provider: Treating Provider/Extender: [name] in Treatment: 8 Subjective History of Present Illness (HPI) LEGNA, LASKOWSKI ([id number]) [id number] Page 5 of 7 07/02/2023 Ms. 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 She is not quite sure how the right upper leg  Per Week/30 Days ary Discharge Instructions: Apply nickel thick amount to wound bed as instructed Secondary Dressing: ABD Pad, 8x10 1 x Per Week/30 Days Discharge Instructions: Apply over primary dressing as directed. Secondary Dressing: Woven Gauze Sponge, Non-Sterile 4x4 in 1 x Per Week/30 Days Discharge Instructions: Apply over primary dressing as directed. Com pression Wrap: Kerlix Roll 4.5x3.1 (in/yd) 1 x Per Week/30 Days Discharge Instructions: Apply Kerlix and Coban compression as directed. Com pression Wrap: Coban Self-Adherent Wrap 4x5 (in/yd) 1 x Per Week/30 Days Discharge Instructions: Apply over Kerlix as directed. 1. We continued with the Hydrofera Blue under kerlix Coban on the left with an ABD pad 2. We will order bilateral juxta lite stockings 3. The patient Had a reduced ABI in the left leg I assume that is why we are just doing kerlix Coban. Will need to check what we know about this. Might just start with repeating that next week now that the edema is of the leg Electronic Signature(s) RAYMONDE, HENDY ([id number]) [id number] Page 7 of 7 Signed: 08/27/2023 4:52:49 PM By:  Katherine Najjar MD Entered By: [name] on 08/27/2023 11:21:49 -------------------------------------------------------------------------------- SuperBill Details Patient Name: Date of Service: [name] 08/27/2023 Medical Record Number: [id number] Patient Account Number: [id number] Date of Birth/Sex: Treating RN: 1931/01/17 (89 y.o. [name], [name] Primary Care Provider: [name] Other Clinician: Referring Provider: Treating Provider/Extender: [name] in Treatment: 8 Diagnosis Coding ICD-10 Codes Code Description [phone] Non-pressure chronic ulcer of other part of right lower leg with fat layer exposed L97.822 Non-pressure chronic ulcer of other part of left lower leg with fat layer exposed I89.0 Lymphedema, not elsewhere classified E11.622 Type 2 diabetes mellitus with other skin ulcer I50.42 Chronic combined systolic (congestive) and diastolic (congestive) heart failure Facility Procedures : CPT4 Code: [id number] Description: 11042 - DEB SUBQ TISSUE 20 SQ CM/< ICD-10 Diagnosis Description L97.822 Non-pressure chronic ulcer of other part of left lower leg with fat layer exposed E11.622 Type 2 diabetes mellitus with other skin ulcer Modifier: Quantity: 1 Physician Procedures : CPT4 Code Description Modifier [id number] 11042 - WC PHYS SUBQ TISS 20 SQ CM ICD-10 Diagnosis Description L97.822 Non-pressure chronic ulcer of other part of left lower leg with fat layer exposed E11.622 Type 2 diabetes mellitus with other skin ulcer Quantity: 1 Electronic Signature(s) Signed: 08/27/2023 4:52:49 PM By: Katherine Najjar MD Entered By: [name] on 08/27/2023 11:21:57  3-4 times a day throughout the day. A void standing for long periods of time. Exercise regularly Compression stocking or Garment 30-40 mm/Hg pressure to: - [facility] will send you out two one per leg juxatlite HD. If compression wraps slide down please call wound center and speak with a nurse. Additional Orders / Instructions Follow Nutritious Diet - increase protein Juven Shake 1-2 times daily. Wound Treatment Wound #12 - Lower Leg Wound Laterality: Left, Anterior, Distal Cleanser: Soap and Water 1 x Per Week/30 Days Discharge Instructions: May shower and wash wound with dial antibacterial soap and water prior to dressing change. Cleanser: Vashe 5.8 (oz) 1 x Per Week/30 Days Discharge Instructions: Cleanse the wound with Vashe prior to applying a clean dressing using gauze sponges, not tissue or cotton balls. Peri-Wound Care: Sween Lotion (Moisturizing lotion) 1 x Per Week/30 Days Discharge Instructions: Apply moisturizing lotion as directed Prim Dressing: Hydrofera Blue Ready Transfer Foam, 2.5x2.5 (in/in) 1 x Per Week/30 Days ary Discharge Instructions: Apply directly to wound bed as directed Prim Dressing: Santyl Ointment 1 x Per Week/30 Days ary Discharge Instructions: Apply nickel thick amount to wound bed as instructed Secondary Dressing: ABD Pad, 8x10 1 x Per Week/30 Days Discharge Instructions: Apply over primary dressing  as directed. Secondary Dressing: Woven Gauze Sponge, Non-Sterile 4x4 in 1 x Per Week/30 Days Discharge Instructions: Apply over primary dressing as directed. Compression Wrap: Kerlix Roll 4.5x3.1 (in/yd) 1 x Per Week/30 Days Discharge Instructions: Apply Kerlix and Coban compression as directed. Compression Wrap: Coban Self-Adherent Wrap 4x5 (in/yd) 1 x Per Week/30 Days Discharge Instructions: Apply over Kerlix as directed. Electronic Signature(s) Signed: 08/27/2023 4:52:49 PM By: Katherine Najjar MD Signed: 08/27/2023 5:19:03 PM By: [name] RN, BSN [place], Signed: 08/27/2023 5:19:03 PM By: [name] RN, BSN [name] ([id number]) [id number] Page 4 of 7 Entered By: [name] on 08/27/2023 11:19:55 -------------------------------------------------------------------------------- Problem List Details Patient Name: Date of Service: IRIELLE, BERGLIN 08/27/2023 11:00 A M Medical Record Number: [id number] Patient Account Number: [id number] Date of Birth/Sex: Treating RN: 07-Sep-1931 (89 y.o. [name], [name] Primary Care Provider: [name] Other Clinician: Referring Provider: Treating Provider/Extender: [name] in Treatment: 8 Active Problems ICD-10 Encounter Code Description Active Date MDM Diagnosis [id number] Non-pressure chronic ulcer of other part of right lower leg with fat layer exposed 07/02/2023 No Yes L97.822 Non-pressure chronic ulcer of other part of left lower leg with fat layer exposed 07/02/2023 No Yes I89.0 Lymphedema, not elsewhere classified 07/02/2023 No Yes E11.622 Type 2 diabetes mellitus with other skin ulcer 07/02/2023 No Yes I50.42 Chronic combined systolic (congestive) and diastolic (congestive) heart failure 07/02/2023 No Yes Inactive Problems Resolved Problems Electronic Signature(s) Signed: 08/27/2023 4:52:49 PM By: Katherine Najjar MD Entered By: [name] on 08/27/2023  11:15:31 -------------------------------------------------------------------------------- Progress Note Details Patient Name: Date of Service: [name] 08/27/2023 11:00 A M Medical Record Number: [id number] Patient Account Number: [id number] Date of Birth/Sex: Treating RN: 02-06-1931 (89 y.o. F) Primary Care Provider: [name] Other Clinician: Referring Provider: Treating Provider/Extender: [name] in Treatment: 8 Subjective History of Present Illness (HPI) LEGNA, LASKOWSKI ([id number]) [id number] Page 5 of 7 07/02/2023 Ms. 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 She is not quite sure how the right upper leg

## 2023-09-03 ENCOUNTER — Encounter (HOSPITAL_BASED_OUTPATIENT_CLINIC_OR_DEPARTMENT_OTHER): Payer: Medicare PPO | Attending: Internal Medicine | Admitting: Internal Medicine

## 2023-09-03 DIAGNOSIS — I5042 Chronic combined systolic (congestive) and diastolic (congestive) heart failure: Secondary | ICD-10-CM | POA: Insufficient documentation

## 2023-09-03 DIAGNOSIS — E1140 Type 2 diabetes mellitus with diabetic neuropathy, unspecified: Secondary | ICD-10-CM | POA: Diagnosis not present

## 2023-09-03 DIAGNOSIS — L97812 Non-pressure chronic ulcer of other part of right lower leg with fat layer exposed: Secondary | ICD-10-CM | POA: Insufficient documentation

## 2023-09-03 DIAGNOSIS — I872 Venous insufficiency (chronic) (peripheral): Secondary | ICD-10-CM | POA: Insufficient documentation

## 2023-09-03 DIAGNOSIS — E1151 Type 2 diabetes mellitus with diabetic peripheral angiopathy without gangrene: Secondary | ICD-10-CM | POA: Diagnosis not present

## 2023-09-03 DIAGNOSIS — E11622 Type 2 diabetes mellitus with other skin ulcer: Secondary | ICD-10-CM | POA: Diagnosis present

## 2023-09-03 DIAGNOSIS — L97822 Non-pressure chronic ulcer of other part of left lower leg with fat layer exposed: Secondary | ICD-10-CM | POA: Insufficient documentation

## 2023-09-03 DIAGNOSIS — I890 Lymphedema, not elsewhere classified: Secondary | ICD-10-CM | POA: Diagnosis not present

## 2023-09-03 DIAGNOSIS — I110 Hypertensive heart disease with heart failure: Secondary | ICD-10-CM | POA: Insufficient documentation

## 2023-09-03 NOTE — Progress Notes (Signed)
[name], [name] ([id number]) [phone].pdf Page 1 of 7 Visit Report for 09/03/2023 Arrival Information Details Patient Name: Date of Service: [name], [name] 09/03/2023 9:45 A M Medical Record Number: [id number] Patient Account Number: [id number] Date of Birth/Sex: Treating RN: [birth year] (89 y.o. [name], [name] Primary Care Travarius Lange: [name] Other Clinician: Referring Delvecchio Madole: Treating Kiela Shisler/Extender: [name] in Treatment: 9 Visit Information History Since Last Visit Added or deleted any medications: No Patient Arrived: [name] Any new allergies or adverse reactions: No Arrival Time: 09:40 Had a fall or experienced change in No Accompanied By: daughter activities of daily living that may affect Transfer Assistance: Manual risk of falls: Patient Identification Verified: Yes Signs or symptoms of abuse/neglect since last visito No Secondary Verification Process Completed: Yes Hospitalized since last visit: No Patient Requires Transmission-Based No Implantable device outside of the clinic excluding No Precautions: cellular tissue based products placed in the center Patient Has Alerts: Yes since last visit: Patient Alerts: ABIs: R:0.92 L:0.91 8/24 Has Dressing in Place as Prescribed: Yes Has Compression in Place as Prescribed: Yes Pain Present Now: No Electronic Signature(s) Signed: 09/03/2023 3:27:15 PM By: [name] RN Entered By: [name] on 09/03/2023 06:42:32 -------------------------------------------------------------------------------- Encounter Discharge Information Details Patient Name: Date of Service: [name] 09/03/2023 9:45 A M Medical Record Number: [id number] Patient Account Number: [id number] Date of Birth/Sex: Treating RN: 1931-04-14 (89 y.o. [name], [name] Primary Care Miking Usrey: [name] Other Clinician: Referring Sarra Rachels: Treating  Jabes Primo/Extender: [name] in Treatment: 9 Encounter Discharge Information Items Post Procedure Vitals Discharge Condition: Stable Temperature (F): 98.7 Ambulatory Status: Walker Pulse (bpm): 74 Discharge Destination: Home Respiratory Rate (breaths/min): 17 Transportation: Private Auto Blood Pressure (mmHg): 150/65 Accompanied By: daughter Schedule Follow-up Appointment: Yes Clinical Summary of Care: Patient Declined Electronic Signature(s) Signed: 09/03/2023 3:27:15 PM By: [name] RN Entered By: [name] on 09/03/2023 07:23:53 Gibbon, Katherine Salisbury ([id number]) [id number] Page 2 of 7 -------------------------------------------------------------------------------- Lower Extremity Assessment Details Patient Name: Date of Service: [name], [name] 09/03/2023 9:45 A M Medical Record Number: [id number] Patient Account Number: [id number] Date of Birth/Sex: Treating RN: 1931-10-18 (89 y.o. [name], [name] Primary Care Charlese Gruetzmacher: [name] Other Clinician: Referring Oziah Vitanza: Treating Ceirra Belli/Extender: [name] in Treatment: 9 Edema Assessment Assessed: Kyra Searles: No] Franne Forts: No] Edema: [Left: Ye] [Right: s] Calf Left: Right: Point of Measurement: 32 cm From Medial Instep 43 cm 40 cm Ankle Left: Right: Point of Measurement: 11 cm From Medial Instep 21.5 cm 21 cm Vascular Assessment Pulses: Dorsalis Pedis Palpable: [Left:Yes] Posterior Tibial Palpable: [Left:Yes] Extremity colors, hair growth, and conditions: Extremity Color: [Left:Normal] Hair Growth on Extremity: [Left:Yes] Temperature of Extremity: [Left:Hot] Capillary Refill: [Left:< 3 seconds] Dependent Rubor: [Left:No No] Toe Nail Assessment Left: Right: Thick: Yes Discolored: Yes Deformed: Yes Improper Length and Hygiene: Yes Electronic Signature(s) Signed: 09/03/2023 3:27:15 PM By: [name] RN Entered By: [name] on 09/03/2023 06:50:07 -------------------------------------------------------------------------------- Multi-Disciplinary Care Plan Details Patient Name: Date of Service: [name] 09/03/2023 9:45 A M Medical Record Number: [id number] Patient Account Number: [id number] Date of Birth/Sex: Treating RN: 1931/03/21 (89 y.o. [name], [name] Primary Care Laurian Edrington: [name] Other Clinician: Referring Salvador Coupe: Treating Willam Munford/Extender: [name] in Treatment: [address] TANGANIKA, BARRADAS ([id number]) [id number] Page 3 of 7 Nutrition Nursing Diagnoses: Potential for alteratiion in Nutrition/Potential for imbalanced nutrition Goals: Patient/caregiver agrees to and verbalizes understanding of need to obtain  nutritional consultation Date Initiated: 07/02/2023 Date Inactivated: 07/30/2023 Target Resolution Date: 08/02/2023 Goal Status: Met Patient/caregiver will maintain therapeutic glucose control Date Initiated: 07/02/2023 Target Resolution Date: 10/05/2023 Goal Status: Active Interventions: Assess HgA1c results as ordered upon admission and as needed Provide education on elevated blood sugars and impact on wound healing Provide education on nutrition Treatment Activities: Patient referred to Primary Care Physician for further nutritional evaluation : 07/02/2023 Notes: Pain, Acute or Chronic Nursing Diagnoses: Pain, acute or chronic: actual or potential Potential alteration in comfort, pain Goals: Patient will verbalize adequate pain control and receive pain control interventions during procedures as needed Date Initiated: 07/02/2023 Date Inactivated: 07/30/2023 Target Resolution Date: 08/02/2023 Goal Status: Met Patient/caregiver will verbalize comfort level met Date Initiated: 07/02/2023 Target Resolution Date: 10/05/2023 Goal Status:  Active Interventions: Encourage patient to take pain medications as prescribed Provide education on pain management Reposition patient for comfort Treatment Activities: Administer pain control measures as ordered : 07/02/2023 Notes: Wound/Skin Impairment Nursing Diagnoses: Knowledge deficit related to ulceration/compromised skin integrity Goals: Patient/caregiver will verbalize understanding of skin care regimen Date Initiated: 07/02/2023 Target Resolution Date: 10/01/2023 Goal Status: Active Interventions: Assess patient/caregiver ability to perform ulcer/skin care regimen upon admission and as needed Assess ulceration(s) every visit Provide education on ulcer and skin care Treatment Activities: Skin care regimen initiated : 07/02/2023 Topical wound management initiated : 07/02/2023 Notes: Electronic Signature(s) Signed: 09/03/2023 3:27:15 PM By: [name] RN Entered By: [name] on 09/03/2023 06:55:40 Langton, Katherine Salisbury ([id number]) [id number] Page 4 of 7 -------------------------------------------------------------------------------- Pain Assessment Details Patient Name: Date of Service: [name], [name] 09/03/2023 9:45 A M Medical Record Number: [id number] Patient Account Number: [id number] Date of Birth/Sex: Treating RN: May 14, 1931 (89 y.o. [name], [name] Primary Care Meleah Demeyer: [name] Other Clinician: Referring Daryn Hicks: Treating Gilberto Streck/Extender: [name] in Treatment: 9 Active Problems Location of Pain Severity and Description of Pain Patient Has Paino No Site Locations Pain Management and Medication Current Pain Management: Electronic Signature(s) Signed: 09/03/2023 3:27:15 PM By: [name] RN Entered By: [name] on 09/03/2023 06:51:02 -------------------------------------------------------------------------------- Patient/Caregiver Education  Details Patient Name: Date of Service: [name] 10/1/2024andnbsp9:45 A M Medical Record Number: [id number] Patient Account Number: [id number] Date of Birth/Gender: Treating RN: 03-16-1931 (89 y.o. Toniann Fail Primary Care Physician: [name] Other Clinician: Referring Physician: Treating Physician/Extender: [name] in Treatment: 9 Education Assessment Education Provided To: Patient Education Topics Provided Wound/Skin Impairment: Methods: Explain/Verbal Responses: Reinforcements needed, State content correctly [name], [name] ([id number]) [id number] Page 5 of 7 Electronic Signature(s) Signed: 09/03/2023 3:27:15 PM By: [name] RN Entered By: [name] on 09/03/2023 06:55:55 -------------------------------------------------------------------------------- Wound Assessment Details Patient Name: Date of Service: [name] 09/03/2023 9:45 A M Medical Record Number: [id number] Patient Account Number: [id number] Date of Birth/Sex: Treating RN: Nov 03, 1931 (89 y.o. [name], [name] Primary Care Lavena Loretto: [name] Other Clinician: Referring Shiori Adcox: Treating Nigel Ericsson/Extender: [name] in Treatment: 9 Wound Status Wound Number: 12 Primary Diabetic Wound/Ulcer of the Lower Extremity Etiology: Wound Location: Left, Distal, Anterior Lower Leg Secondary Lymphedema Wounding Event: Blister Etiology: Date Acquired: 06/12/2023 Wound Open Weeks Of Treatment: 9 Status: Clustered Wound: No Comorbid Anemia, Congestive Heart Failure, Hypertension, Peripheral History: Venous Disease, Type II Diabetes, Osteoarthritis, Neuropathy Photos Wound Measurements Length: (cm) 0.8 Width: (cm) 0.4 Depth: (cm) 0.1 Area: (cm) 0.251 Volume: (cm) 0.025 % Reduction in Area: 93.4% % Reduction in Volume: 93.4% Epithelialization: Large  (67-100%)  Tunneling: No Undermining: No Wound Description Classification: Grade 1 Wound Margin: Distinct, outline attached Exudate Amount: Medium Exudate Type: Serosanguineous Exudate Color: red, brown Foul Odor After Cleansing: No Slough/Fibrino Yes Wound Bed Granulation Amount: Small (1-33%) Exposed Structure Granulation Quality: Red, Pink Fascia Exposed: No Necrotic Amount: Large (67-100%) Fat Layer (Subcutaneous Tissue) Exposed: No Necrotic Quality: Adherent Slough Tendon Exposed: No Muscle Exposed: No Joint Exposed: No Bone Exposed: No Periwound Skin Texture Texture Color No Abnormalities Noted: No No Abnormalities Noted: No Callus: No Atrophie Blanche: No Crepitus: No Cyanosis: No [name], [name] ([id number]) [phone].pdf Page 6 of 7 Excoriation: No Ecchymosis: No Induration: No Erythema: No Rash: No Hemosiderin Staining: Yes Scarring: Yes Mottled: No Pallor: No Moisture Rubor: No No Abnormalities Noted: No Dry / Scaly: No Temperature / Pain Maceration: No Temperature: No Abnormality Tenderness on Palpation: Yes Treatment Notes Wound #12 (Lower Leg) Wound Laterality: Left, Anterior, Distal Cleanser Soap and Water Discharge Instruction: May shower and wash wound with dial antibacterial soap and water prior to dressing change. Vashe 5.8 (oz) Discharge Instruction: Cleanse the wound with Vashe prior to applying a clean dressing using gauze sponges, not tissue or cotton balls. Peri-Wound Care Sween Lotion (Moisturizing lotion) Discharge Instruction: Apply moisturizing lotion as directed Topical Primary Dressing IODOFLEX 0.9% Cadexomer Iodine Pad 4x6 cm Discharge Instruction: Apply to wound bed as instructed Secondary Dressing ABD Pad, 8x10 Discharge Instruction: Apply over primary dressing as directed. Woven Gauze Sponge, Non-Sterile 4x4 in Discharge Instruction: Apply over primary dressing as directed. Secured  With Compression Wrap Kerlix Roll 4.5x3.1 (in/yd) Discharge Instruction: Apply Kerlix and Coban compression as directed. Coban Self-Adherent Wrap 4x5 (in/yd) Discharge Instruction: Apply over Kerlix as directed. Compression Stockings Add-Ons Electronic Signature(s) Signed: 09/03/2023 3:27:15 PM By: [name] RN Signed: 09/03/2023 4:55:59 PM By: Brenton Grills Entered By: Brenton Grills on 09/03/2023 06:54:20 -------------------------------------------------------------------------------- Vitals Details Patient Name: Date of Service: [name] 09/03/2023 9:45 A M Medical Record Number: [id number] Patient Account Number: [id number] Date of Birth/Sex: Treating RN: Oct 20, 1931 (89 y.o. [name], [name] Primary Care Jester Klingberg: [name] Other Clinician: Referring Koltin Wehmeyer: Treating Kennette Cuthrell/Extender: [name] in Treatment: 9 Vital Signs Time Taken: 09:42 Temperature (F): 98.2 Height (in): 62 Pulse (bpm): 66 [name], [name] ([id number]) [phone].pdf Page 7 of 7 Weight (lbs): 220 Respiratory Rate (breaths/min): 17 Body Mass Index (BMI): 40.2 Blood Pressure (mmHg): 150/65 Reference Range: 80 - 120 mg / dl Electronic Signature(s) Signed: 09/03/2023 3:27:15 PM By: [name] RN Entered By: [name] on 09/03/2023 06:44:48

## 2023-09-03 NOTE — Progress Notes (Signed)
[name], [name] ([id number]) [phone].pdf Page 1 of 7 Visit Report for 09/03/2023 Debridement Details Patient Name: Date of Service: [name], [name] 09/03/2023 9:45 A M Medical Record Number: [id number] Patient Account Number: [id number] Date of Birth/Sex: Treating RN: February 10, 1931 (89 y.o. [name], [name] Primary Care Provider: [name] Other Clinician: Referring Provider: Treating Provider/Extender: [name] in Treatment: 9 Debridement Performed for Assessment: Wound #12 Left,Distal,Anterior Lower Leg Performed By: Physician Maxwell Caul., MD The following information was scribed by: [name] The information was scribed for: Baltazar Najjar Debridement Type: Debridement Severity of Tissue Pre Debridement: Fat layer exposed Level of Consciousness (Pre-procedure): Awake and Alert Pre-procedure Verification/Time Out Yes - 10:00 Taken: Start Time: 10:00 Pain Control: Lidocaine Percent of Wound Bed Debrided: 100% T Area Debrided (cm): otal 0.25 Tissue and other material debrided: Viable, Non-Viable, Subcutaneous, Skin: Epidermis Level: Skin/Subcutaneous Tissue Debridement Description: Excisional Instrument: Curette Bleeding: Minimum Hemostasis Achieved: Pressure End Time: 10:00 Procedural Pain: 0 Post Procedural Pain: 0 Response to Treatment: Procedure was tolerated well Level of Consciousness (Post- Awake and Alert procedure): Post Debridement Measurements of Total Wound Length: (cm) 0.8 Width: (cm) 0.4 Depth: (cm) 0.1 Volume: (cm) 0.025 Character of Wound/Ulcer Post Debridement: Improved Severity of Tissue Post Debridement: Fat layer exposed Post Procedure Diagnosis Same as Pre-procedure Electronic Signature(s) Signed: 09/03/2023 3:27:15 PM By: [name] RN Signed: 09/03/2023 4:26:34 PM By: Baltazar Najjar MD Entered By: [name] on 09/03/2023  10:04:24 -------------------------------------------------------------------------------- HPI Details Patient Name: Date of Service: [name] 09/03/2023 9:45 A M Medical Record Number: [id number] Patient Account Number: [id number] Date of Birth/Sex: Treating RN: 1930-12-23 (89 y.o. F) Primary Care Provider: [name] Other Clinician: Referring Provider: Treating Provider/Extender: [name], [name], [name] ([id number]) [id number] Page 2 of 7 Weeks in Treatment: 9 History of Present Illness HPI Description: 07/02/2023 Ms. 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 She is not quite sure how the right upper leg wound started. The left lower extremity wound started out as blisters. She has compression stockings but has not been wearing them. She takes Lasix 20 mg daily. She currently denies signs of infection. In office ABIs were 0.57 on the left however dorsalis pedal pulses and posterior tibialis pulses were heard on Doppler. 8/6; patient presents for follow-up. We have been using antibiotic ointment with Hydrofera Blue under Kerlix/Coban to the left lower extremity. Hydrofera Blue to the right lower leg. She came in for nurse visit and wrap change. She reports no issues. She reports that she is scheduled for her ABIs next week. 8/13; patient presents for follow-up. We have been using Hydrofera Blue and Santyl to the left lower extremity under Kerlix/Coban. She has been using Hydrofera Blue to the right upper leg wound. She had ABIs completed yesterday that showed an ABI of 0.91 on the left and 0.92 on the right. She has no issues or complaints today. 8/20; patient presents for  follow-up. We have been using Hydrofera Blue and Santyl to the left lower extremity under 3 layer compression and Hydrofera Blue to the right upper leg wound. The right upper leg wound has healed. She feels that the wrap was too tight with the 3 layer. We will go back down to Kerlix/Coban. Other than that she  has no issues or complaints. She denies signs of infection. Wounds Appear smaller. 8/27; patient presents for follow-up. We have been using Hydrofera Blue and Santyl to the left lower extremity wounds under Kerlix/Coban. She tolerated this well. She has no issues or complaints today. Wounds are smaller. 9/3; patient presents for follow-up. We have been using Hydrofera Blue and Santyl to the left lower extremity wound under Kerlix/Coban. Wound is smaller. 9/10; patient presents for follow-up. We have been using Hydrofera Blue and Santyl to the lower extremity under Kerlix/Coban. Wound appears smaller and well-healing. 9/17 continued improvement we have been using Santyl and Hydrofera Blue under kerlix Coban to the left leg wound largely secondary to chronic lymphedema 9/24; continued improvement in the wound size on the left anterior mid tibia. This is in the setting of significant left greater than right lower leg lymphedema. Initially the patient came in with wounds on both legs however the right leg is healed. The wound on the left leg is smaller. We will go ahead and order bilateral juxta lite stockings today 10/1; still a small open wound on the left anterior lower leg in the setting of chronic venous insufficiency and lymphedema. We have been using Santyl and Hydrofera Blue under Kerlix Coban. Everything is closed on the right leg and she has her own juxta lite which she will just the 30/40 mm compression. Electronic Signature(s) Signed: 09/03/2023 4:26:34 PM By: Baltazar Najjar MD Entered By: Baltazar Najjar on 09/03/2023  10:19:55 -------------------------------------------------------------------------------- Physical Exam Details Patient Name: Date of Service: [name] 09/03/2023 9:45 A M Medical Record Number: [id number] Patient Account Number: [id number] Date of Birth/Sex: Treating RN: 12/29/1930 (89 y.o. F) Primary Care Provider: [name] Other Clinician: Referring Provider: Treating Provider/Extender: [name] in Treatment: 9 Constitutional Patient is hypertensive.. Pulse regular and within target range for patient.Marland Kitchen Respirations regular, non-labored and within target range.. Temperature is normal and within the target range for the patient.Marland Kitchen Appears in no distress. Notes Wound exam; the only remaining wound is on the left anterior lower leg. Small open area I think however it is unchanged from last week and perhaps not much change from the week before. I used a #3 curette to remove some adherent slough and subcutaneous tissue hemostasis with direct pressure on the right leg everything is healed Electronic Signature(s) Signed: 09/03/2023 4:26:34 PM By: Baltazar Najjar MD Entered By: Baltazar Najjar on 09/03/2023 10:21:03 [name] ([id number]) [id number] Page 3 of 7 -------------------------------------------------------------------------------- Physician Orders Details Patient Name: Date of Service: [name], [name] 09/03/2023 9:45 A M Medical Record Number: [id number] Patient Account Number: [id number] Date of Birth/Sex: Treating RN: 23-Mar-1931 (89 y.o. [name], [name] Primary Care Provider: [name] Other Clinician: Referring Provider: Treating Provider/Extender: [name] in Treatment: 9 Verbal / Phone Orders: No Diagnosis Coding Follow-up Appointments ppointment in 1 week. - w/ Dr. Leanord Hawking Tuesday 09/10/23 @ 11:00 Rm # 9 (already Scheduled) Return  A Other: - [employer] - call about your compression stockings. Anesthetic (In clinic) Topical Lidocaine 4% applied to wound bed Bathing/ Shower/ Hygiene May shower with protection but do not get wound dressing(s) wet. Protect dressing(s) with water repellant cover (for example, large plastic bag) or a cast cover and may then take shower. Edema Control - Lymphedema / SCD / Other Elevate legs to the level of the heart or above for 30 minutes daily and/or when sitting for 3-4 times a day throughout the day.  A void standing for long periods of time. Exercise regularly Compression stocking or Garment 30-40 mm/Hg pressure to: - [employer] will send you out two one per leg juxatlite HD. If compression wraps slide down please call wound center and speak with a nurse. Additional Orders / Instructions Follow Nutritious Diet - increase protein Juven Shake 1-2 times daily. Wound Treatment Wound #12 - Lower Leg Wound Laterality: Left, Anterior, Distal Cleanser: Soap and Water 1 x Per Week/30 Days Discharge Instructions: May shower and wash wound with dial antibacterial soap and water prior to dressing change. Cleanser: Vashe 5.8 (oz) 1 x Per Week/30 Days Discharge Instructions: Cleanse the wound with Vashe prior to applying a clean dressing using gauze sponges, not tissue or cotton balls. Peri-Wound Care: Sween Lotion (Moisturizing lotion) 1 x Per Week/30 Days Discharge Instructions: Apply moisturizing lotion as directed Prim Dressing: IODOFLEX 0.9% Cadexomer Iodine Pad 4x6 cm 1 x Per Week/30 Days ary Discharge Instructions: Apply to wound bed as instructed Secondary Dressing: ABD Pad, 8x10 1 x Per Week/30 Days Discharge Instructions: Apply over primary dressing as directed. Secondary Dressing: Woven Gauze Sponge, Non-Sterile 4x4 in 1 x Per Week/30 Days Discharge Instructions: Apply over primary dressing as directed. Compression Wrap: Kerlix Roll 4.5x3.1 (in/yd) 1 x Per Week/30 Days Discharge  Instructions: Apply Kerlix and Coban compression as directed. Compression Wrap: Coban Self-Adherent Wrap 4x5 (in/yd) 1 x Per Week/30 Days Discharge Instructions: Apply over Kerlix as directed. Electronic Signature(s) Signed: 09/03/2023 3:27:15 PM By: [name] RN Signed: 09/03/2023 4:26:34 PM By: Baltazar Najjar MD Entered By: [name] on 09/03/2023 10:04:45 [name] ([id number]) [id number] Page 4 of 7 -------------------------------------------------------------------------------- Problem List Details Patient Name: Date of Service: [name], [name] 09/03/2023 9:45 A M Medical Record Number: [id number] Patient Account Number: [id number] Date of Birth/Sex: Treating RN: 04-04-1931 (89 y.o. F) Primary Care Provider: [name] Other Clinician: Referring Provider: Treating Provider/Extender: [name] in Treatment: 9 Active Problems ICD-10 Encounter Code Description Active Date MDM Diagnosis L97.812 Non-pressure chronic ulcer of other part of right lower leg with fat layer 07/02/2023 No Yes exposed L97.822 Non-pressure chronic ulcer of other part of left lower leg with fat layer exposed7/30/2024 No Yes I89.0 Lymphedema, not elsewhere classified 07/02/2023 No Yes E11.622 Type 2 diabetes mellitus with other skin ulcer 07/02/2023 No Yes I50.42 Chronic combined systolic (congestive) and diastolic (congestive) heart failure 07/02/2023 No Yes Inactive Problems Resolved Problems Electronic Signature(s) Signed: 09/03/2023 4:26:34 PM By: Baltazar Najjar MD Entered By: Baltazar Najjar on 09/03/2023 10:19:01 -------------------------------------------------------------------------------- Progress Note Details Patient Name: Date of Service: [name] 09/03/2023 9:45 A M Medical Record Number: [id number] Patient Account Number: [id number] Date of Birth/Sex: Treating RN: 18-Nov-1931 (89 y.o.  F) Primary Care Provider: [name] Other Clinician: Referring Provider: Treating Provider/Extender: [name] in Treatment: 9 Subjective History of Present Illness (HPI) 07/02/2023 Ms. 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 She is not quite sure how the right upper leg wound started. The left lower extremity wound started out as blisters. She has compression stockings but has not been wearing them. She takes Lasix 20 mg daily. She currently denies signs of infection. In office ABIs were 0.57 on the left however dorsalis pedal pulses and posterior tibialis pulses were [name],  [name] ([id number]) [id number] Page 5 of 7 heard on Doppler. 8/6; patient presents for follow-up. We have been using antibiotic ointment with Hydrofera Blue under Kerlix/Coban to the left lower extremity. Hydrofera Blue to the right lower leg. She came in for nurse visit and wrap change. She reports no issues. She reports that she is scheduled for her ABIs next week. 8/13; patient presents for follow-up. We have been using Hydrofera Blue and Santyl to the left lower extremity under Kerlix/Coban. She has been using Hydrofera Blue to the right upper leg wound. She had ABIs completed yesterday that showed an ABI of 0.91 on the left and 0.92 on the right. She has no issues or complaints today. 8/20; patient presents for follow-up. We have been using Hydrofera Blue and Santyl to the left lower extremity under 3 layer compression and Hydrofera Blue to the right upper leg wound. The right upper leg wound has healed. She feels that the wrap was too tight with the 3 layer. We will go back down to Kerlix/Coban. Other than  that she has no issues or complaints. She denies signs of infection. Wounds Appear smaller. 8/27; patient presents for follow-up. We have been using Hydrofera Blue and Santyl to the left lower extremity wounds under Kerlix/Coban. She tolerated this well. She has no issues or complaints today. Wounds are smaller. 9/3; patient presents for follow-up. We have been using Hydrofera Blue and Santyl to the left lower extremity wound under Kerlix/Coban. Wound is smaller. 9/10; patient presents for follow-up. We have been using Hydrofera Blue and Santyl to the lower extremity under Kerlix/Coban. Wound appears smaller and well-healing. 9/17 continued improvement we have been using Santyl and Hydrofera Blue under kerlix Coban to the left leg wound largely secondary to chronic lymphedema 9/24; continued improvement in the wound size on the left anterior mid tibia. This is in the setting of significant left greater than right lower leg lymphedema. Initially the patient came in with wounds on both legs however the right leg is healed. The wound on the left leg is smaller. We will go ahead and order bilateral juxta lite stockings today 10/1; still a small open wound on the left anterior lower leg in the setting of chronic venous insufficiency and lymphedema. We have been using Santyl and Hydrofera Blue under Kerlix Coban. Everything is closed on the right leg and she has her own juxta lite which she will just the 30/40 mm compression. Objective Constitutional Patient is hypertensive.. Pulse regular and within target range for patient.Marland Kitchen Respirations regular, non-labored and within target range.. Temperature is normal and within the target range for the patient.Marland Kitchen Appears in no distress. Vitals Time Taken: 9:42 AM, Height: 62 in, Weight: 220 lbs, BMI: 40.2, Temperature: 98.2 F, Pulse: 66 bpm, Respiratory Rate: 17 breaths/min, Blood Pressure: 150/65 mmHg. General Notes: Wound exam; the only remaining wound is on the  left anterior lower leg. Small open area I think however it is unchanged from last week and perhaps not much change from the week before. I used a #3 curette to remove some adherent slough and subcutaneous tissue hemostasis with direct pressure on the right leg everything is healed Integumentary (Hair, Skin) Wound #12 status is Open. Original cause of wound was Blister. The date acquired was: 06/12/2023. The wound has been in treatment 9 weeks. The wound is located on the [facility] Lower Leg. The wound measures 0.8cm length x 0.4cm width x 0.1cm depth; 0.251cm^2 area and 0.025cm^3 volume. There is no tunneling or undermining  noted. There is a medium amount of serosanguineous drainage noted. The wound margin is distinct with the outline attached to the wound base. There is small (1-33%) red, pink granulation within the wound bed. There is a large (67-100%) amount of necrotic tissue within the wound bed including Adherent Slough. The periwound skin appearance exhibited: Scarring, Hemosiderin Staining. The periwound skin appearance did not exhibit: Callus, Crepitus, Excoriation, Induration, Rash, Dry/Scaly, Maceration, Atrophie Blanche, Cyanosis, Ecchymosis, Mottled, Pallor, Rubor, Erythema. Periwound temperature was noted as No Abnormality. The periwound has tenderness on palpation. Assessment Active Problems ICD-10 Non-pressure chronic ulcer of other part of right lower leg with fat layer exposed Non-pressure chronic ulcer of other part of left lower leg with fat layer exposed Lymphedema, not elsewhere classified Type 2 diabetes mellitus with other skin ulcer Chronic combined systolic (congestive) and diastolic (congestive) heart failure Procedures Wound #12 Pre-procedure diagnosis of Wound #12 is a Diabetic Wound/Ulcer of the Lower Extremity located on the Left,Distal,Anterior Lower Leg .Severity of Tissue Pre Debridement is: Fat layer exposed. There was a Excisional Skin/Subcutaneous  Tissue Debridement with a total area of 0.25 sq cm performed by Maxwell Caul., MD. With the following instrument(s): Curette to remove Viable and Non-Viable tissue/material. Material removed includes Subcutaneous Tissue and Skin: Epidermis and after achieving pain control using Lidocaine. No specimens were taken. A time out was conducted at 10:00, prior to the start of the [place] ([id number]) [phone].pdf Page 6 of 7 procedure. A Minimum amount of bleeding was controlled with Pressure. The procedure was tolerated well with a pain level of 0 throughout and a pain level of 0 following the procedure. Post Debridement Measurements: 0.8cm length x 0.4cm width x 0.1cm depth; 0.025cm^3 volume. Character of Wound/Ulcer Post Debridement is improved. Severity of Tissue Post Debridement is: Fat layer exposed. Post procedure Diagnosis Wound #12: Same as Pre-Procedure Plan Follow-up Appointments: Return Appointment in 1 week. - w/ Dr. Leanord Hawking Tuesday 09/10/23 @ 11:00 Rm # 9 (already Scheduled) Other: - [employer] - call about your compression stockings. Anesthetic: (In clinic) Topical Lidocaine 4% applied to wound bed Bathing/ Shower/ Hygiene: May shower with protection but do not get wound dressing(s) wet. Protect dressing(s) with water repellant cover (for example, large plastic bag) or a cast cover and may then take shower. Edema Control - Lymphedema / SCD / Other: Elevate legs to the level of the heart or above for 30 minutes daily and/or when sitting for 3-4 times a day throughout the day. Avoid standing for long periods of time. Exercise regularly Compression stocking or Garment 30-40 mm/Hg pressure to: - [employer] will send you out two one per leg juxatlite HD. If compression wraps slide down please call wound center and speak with a nurse. Additional Orders / Instructions: Follow Nutritious Diet - increase protein Juven Shake 1-2 times daily. WOUND  #12: - Lower Leg Wound Laterality: Left, Anterior, Distal Cleanser: Soap and Water 1 x Per Week/30 Days Discharge Instructions: May shower and wash wound with dial antibacterial soap and water prior to dressing change. Cleanser: Vashe 5.8 (oz) 1 x Per Week/30 Days Discharge Instructions: Cleanse the wound with Vashe prior to applying a clean dressing using gauze sponges, not tissue or cotton balls. Peri-Wound Care: Sween Lotion (Moisturizing lotion) 1 x Per Week/30 Days Discharge Instructions: Apply moisturizing lotion as directed Prim Dressing: IODOFLEX 0.9% Cadexomer Iodine Pad 4x6 cm 1 x Per Week/30 Days ary Discharge Instructions: Apply to wound bed as instructed Secondary Dressing: ABD Pad, 8x10 1  x Per Week/30 Days Discharge Instructions: Apply over primary dressing as directed. Secondary Dressing: Woven Gauze Sponge, Non-Sterile 4x4 in 1 x Per Week/30 Days Discharge Instructions: Apply over primary dressing as directed. Com pression Wrap: Kerlix Roll 4.5x3.1 (in/yd) 1 x Per Week/30 Days Discharge Instructions: Apply Kerlix and Coban compression as directed. Com pression Wrap: Coban Self-Adherent Wrap 4x5 (in/yd) 1 x Per Week/30 Days Discharge Instructions: Apply over Kerlix as directed. 1. I change the primary dressing to Iodoflex still under the same compression on the left leg 2. We applied her own juxta lite stocking on the right leg. We are attempting to get 1 for the left leg as well 3. Adjustment compression on the juxta lites to 30/40 mmHg 4. Hopefully the Iodoflex will pull this together sufficiently to close the wound on the left Electronic Signature(s) Signed: 09/03/2023 4:26:34 PM By: Baltazar Najjar MD Entered By: Baltazar Najjar on 09/03/2023 10:22:50 -------------------------------------------------------------------------------- SuperBill Details Patient Name: Date of Service: [name] 09/03/2023 Medical Record Number: [id number] Patient Account Number:  [id number] Date of Birth/Sex: Treating RN: [birth year] (89 y.o. [name], [name] Primary Care Provider: [name] Other Clinician: Referring Provider: Treating Provider/Extender: [name] in Treatment: 9 Diagnosis Coding ICD-10 Codes Code Description HANAKO, TIPPING ([id number]) [id number] Page 7 of 7 [phone] Non-pressure chronic ulcer of other part of right lower leg with fat layer exposed L97.822 Non-pressure chronic ulcer of other part of left lower leg with fat layer exposed I89.0 Lymphedema, not elsewhere classified E11.622 Type 2 diabetes mellitus with other skin ulcer I50.42 Chronic combined systolic (congestive) and diastolic (congestive) heart failure Facility Procedures : CPT4 Code: [id number] Description: 11042 - DEB SUBQ TISSUE 20 SQ CM/< ICD-10 Diagnosis Description L97.822 Non-pressure chronic ulcer of other part of left lower leg with fat layer expo Modifier: sed Quantity: 1 Physician Procedures : CPT4 Code Description Modifier [id number] 11042 - WC PHYS SUBQ TISS 20 SQ CM ICD-10 Diagnosis Description L97.822 Non-pressure chronic ulcer of other part of left lower leg with fat layer exposed Quantity: 1 Electronic Signature(s) Signed: 09/03/2023 4:26:34 PM By: Baltazar Najjar MD Entered By: Baltazar Najjar on 09/03/2023 10:23:00

## 2023-09-10 ENCOUNTER — Encounter (HOSPITAL_BASED_OUTPATIENT_CLINIC_OR_DEPARTMENT_OTHER): Payer: Medicare PPO | Admitting: Internal Medicine

## 2023-09-10 DIAGNOSIS — E11622 Type 2 diabetes mellitus with other skin ulcer: Secondary | ICD-10-CM | POA: Diagnosis not present

## 2023-09-12 NOTE — Progress Notes (Signed)
Katherine, [name] ([id number]) [id number] Page 1 of 7 Visit Report for 09/10/2023 Debridement Details Patient Name: Date of Service: [name], [name] 09/10/2023 11:00 A M Medical Record Number: [id number] Patient Account Number: [id number] Date of Birth/Sex: Treating RN: Nov 28, 1931 (89 y.o. F) Primary Care Provider: [name] Other Clinician: Referring Provider: Treating Provider/Extender: [name] in Treatment: 10 Debridement Performed for Assessment: Wound #12 Left,Distal,Anterior Lower Leg Performed By: Physician Maxwell Caul., MD The following information was scribed by: Brenton Grills The information was scribed for: Baltazar Najjar Debridement Type: Debridement Severity of Tissue Pre Debridement: Fat layer exposed Level of Consciousness (Pre-procedure): Awake and Alert Pre-procedure Verification/Time Out Yes - 11:34 Taken: Start Time: 11:35 Pain Control: Lidocaine 4% Topical Solution Percent of Wound Bed Debrided: 100% T Area Debrided (cm): otal 0.13 Tissue and other material debrided: Non-Viable, Eschar, Subcutaneous Level: Skin/Subcutaneous Tissue Debridement Description: Excisional Instrument: Curette Bleeding: Minimum Hemostasis Achieved: Pressure Response to Treatment: Procedure was tolerated well Level of Consciousness (Post- Awake and Alert procedure): Post Debridement Measurements of Total Wound Length: (cm) 0.4 Width: (cm) 0.4 Depth: (cm) 0.1 Volume: (cm) 0.013 Character of Wound/Ulcer Post Debridement: Improved Severity of Tissue Post Debridement: Fat layer exposed Post Procedure Diagnosis Same as Pre-procedure Electronic Signature(s) Signed: 09/11/2023 9:33:55 AM By: Baltazar Najjar MD Entered By: Baltazar Najjar on 09/10/2023 09:37:22 -------------------------------------------------------------------------------- HPI Details Patient Name: Date of Service: [name]  09/10/2023 11:00 A M Medical Record Number: [id number] Patient Account Number: [id number] Date of Birth/Sex: Treating RN: Apr 07, 1931 (89 y.o. F) Primary Care Provider: [name] Other Clinician: Referring Provider: Treating Provider/Extender: [name] in Treatment: 10 History of Present Illness CORLISS, COGGESHALL ([id number]) [id number] Page 2 of 7 HPI Description: 07/02/2023 Ms. 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 She is not quite sure how the right upper leg wound started. The left lower extremity wound started out as blisters. She has compression stockings but has not been wearing them. She takes Lasix 20 mg daily. She currently denies signs of infection. In office ABIs were 0.57 on the left however dorsalis pedal pulses and posterior tibialis pulses were heard on Doppler. 8/6; patient presents for follow-up. We have been using antibiotic ointment with Hydrofera Blue under Kerlix/Coban to the left lower extremity. Hydrofera Blue to the right lower leg. She came in for nurse visit and wrap change. She reports no issues. She reports that she is scheduled for her ABIs next week. 8/13; patient presents for follow-up. We have been using Hydrofera Blue and Santyl to the left lower extremity under Kerlix/Coban. She has been using Hydrofera Blue to the right upper leg wound. She had ABIs completed yesterday that showed an ABI of 0.91 on the left and 0.92 on the right. She has no issues or complaints today. 8/20; patient presents for follow-up. We have been using Hydrofera Blue and Santyl to the left lower extremity under 3 layer compression and Hydrofera Blue to the right upper leg wound. The  right upper leg wound has healed. She feels that the wrap was too tight with the 3 layer. We will go back down to Kerlix/Coban. Other than that she has no issues or complaints. She denies signs of infection. Wounds Appear smaller. 8/27; patient presents for follow-up. We  have been using Hydrofera Blue and Santyl to the left lower extremity wounds under Kerlix/Coban. She tolerated this well. She has no issues or complaints today. Wounds are smaller. 9/3; patient presents for follow-up. We have been using Hydrofera Blue and Santyl to the left lower extremity wound under Kerlix/Coban. Wound is smaller. 9/10; patient presents for follow-up. We have been using Hydrofera Blue and Santyl to the lower extremity under Kerlix/Coban. Wound appears smaller and well-healing. 9/17 continued improvement we have been using Santyl and Hydrofera Blue under kerlix Coban to the left leg wound largely secondary to chronic lymphedema 9/24; continued improvement in the wound size on the left anterior mid tibia. This is in the setting of significant left greater than right lower leg lymphedema. Initially the patient came in with wounds on both legs however the right leg is healed. The wound on the left leg is smaller. We will go ahead and order bilateral juxta lite stockings today 10/1; still a small open wound on the left anterior lower leg in the setting of chronic venous insufficiency and lymphedema. We have been using Santyl and Hydrofera Blue under Kerlix Coban. Everything is closed on the right leg and she has her own juxta lite which she will just the 30/40 mm compression. Electronic Signature(s) Signed: 09/11/2023 9:33:55 AM By: Baltazar Najjar MD Entered By: Baltazar Najjar on 09/10/2023 09:37:59 -------------------------------------------------------------------------------- Physical Exam Details Patient Name: Date of Service: [name] 09/10/2023 11:00 A M Medical Record Number: [id number] Patient  Account Number: [id number] Date of Birth/Sex: Treating RN: 22-Sep-1931 (89 y.o. F) Primary Care Provider: [name] Other Clinician: Referring Provider: Treating Provider/Extender: [name] in Treatment: 10 Constitutional Sitting or standing Blood Pressure is within target range for patient.. Pulse regular and within target range for patient.Marland Kitchen Respirations regular, non-labored and within target range.. Temperature is normal and within the target range for the patient.Marland Kitchen Appears in no distress. Notes Wound exam; small area on the left anterior lower leg. Still requiring debridement with a #3 curette of nonviable subcutaneous tissue I am able to get down to reasonably healthy looking tissue but this keeps recurring. There is no evidence of surrounding infection Electronic Signature(s) Signed: 09/11/2023 9:33:55 AM By: Baltazar Najjar MD Entered By: Baltazar Najjar on 09/10/2023 09:41:05 Physician Orders Details -------------------------------------------------------------------------------- [name] ([id number]) [id number] Page 3 of 7 Patient Name: Date of Service: KATHERLEEN, FOLKES 09/10/2023 11:00 A M Medical Record Number: [id number] Patient Account Number: [id number] Date of Birth/Sex: Treating RN: 09-10-1931 (89 y.o. [name] Primary Care Provider: [name] Other Clinician: Referring Provider: Treating Provider/Extender: [name] in Treatment: 10 Verbal / Phone Orders: No Diagnosis Coding Follow-up Appointments ppointment in 1 week. - w/ Dr. Leanord Hawking Tuesday - please make appt. Return A Other: - [employer] - call about your compression stockings. Anesthetic (In clinic) Topical Lidocaine 4% applied to wound bed Bathing/ Shower/ Hygiene May shower with protection but do not get wound dressing(s) wet. Protect dressing(s) with water repellant cover  (for example, large plastic bag) or a cast cover and may then take shower. Edema Control - Lymphedema / SCD / Other Elevate legs to the level of the heart or above for 30 minutes daily and/or when sitting for 3-4 times a day throughout the day. A void standing for long periods of time. Exercise regularly Compression stocking or Garment 30-40 mm/Hg pressure to: - [employer] will send you out two one per leg juxatlite HD.  If compression wraps slide down please call wound center and speak with a nurse. Additional Orders / Instructions Follow Nutritious Diet - increase protein Juven Shake 1-2 times daily. Wound Treatment Wound #12 - Lower Leg Wound Laterality: Left, Anterior, Distal Cleanser: Soap and Water 1 x Per Week/30 Days Discharge Instructions: May shower and wash wound with dial antibacterial soap and water prior to dressing change. Cleanser: Vashe 5.8 (oz) 1 x Per Week/30 Days Discharge Instructions: Cleanse the wound with Vashe prior to applying a clean dressing using gauze sponges, not tissue or cotton balls. Peri-Wound Care: Sween Lotion (Moisturizing lotion) 1 x Per Week/30 Days Discharge Instructions: Apply moisturizing lotion as directed Prim Dressing: IODOFLEX 0.9% Cadexomer Iodine Pad 4x6 cm 1 x Per Week/30 Days ary Discharge Instructions: Apply to wound bed as instructed Secondary Dressing: ABD Pad, 8x10 1 x Per Week/30 Days Discharge Instructions: Apply over primary dressing as directed. Secondary Dressing: Woven Gauze Sponge, Non-Sterile 4x4 in 1 x Per Week/30 Days Discharge Instructions: Apply over primary dressing as directed. Compression Wrap: Kerlix Roll 4.5x3.1 (in/yd) 1 x Per Week/30 Days Discharge Instructions: Apply Kerlix and Coban compression as directed. Compression Wrap: Coban Self-Adherent Wrap 4x5 (in/yd) 1 x Per Week/30 Days Discharge Instructions: Apply over Kerlix as directed. Electronic Signature(s) Signed: 09/11/2023 9:33:55 AM By: Baltazar Najjar  MD Signed: 09/12/2023 4:20:59 PM By: Brenton Grills Entered By: Brenton Grills on 09/10/2023 08:05:07 -------------------------------------------------------------------------------- Problem List Details Patient Name: Date of Service: CYERRA, YIM 09/10/2023 11:00 A M Medical Record Number: [id number] Patient Account Number: [id number] Date of Birth/Sex: Treating RN: [birth year] (89 y.o. [name] [place], [name] ([id number]) [phone].pdf Page 4 of 7 Primary Care Provider: [name] Other Clinician: Referring Provider: Treating Provider/Extender: [name] in Treatment: 10 Active Problems ICD-10 Encounter Code Description Active Date MDM Diagnosis L97.812 Non-pressure chronic ulcer of other part of right lower leg with fat layer 07/02/2023 No Yes exposed L97.822 Non-pressure chronic ulcer of other part of left lower leg with fat layer exposed7/30/2024 No Yes I89.0 Lymphedema, not elsewhere classified 07/02/2023 No Yes E11.622 Type 2 diabetes mellitus with other skin ulcer 07/02/2023 No Yes I50.42 Chronic combined systolic (congestive) and diastolic (congestive) heart failure 07/02/2023 No Yes Inactive Problems Resolved Problems Electronic Signature(s) Signed: 09/11/2023 9:33:55 AM By: Baltazar Najjar MD Entered By: Baltazar Najjar on 09/10/2023 09:36:02 -------------------------------------------------------------------------------- Progress Note Details Patient Name: Date of Service: [name] 09/10/2023 11:00 A M Medical Record Number: [id number] Patient Account Number: [id number] Date of Birth/Sex: Treating RN: [birth year] (89 y.o. F) Primary Care Provider: [name] Other Clinician: Referring Provider: Treating Provider/Extender: [name] in Treatment: 10 Subjective History of Present Illness (HPI) 07/02/2023 Ms. 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 She is not quite sure how the right upper leg wound started. The left lower extremity wound started out as blisters. She has compression stockings but has not been wearing them. She takes Lasix 20 mg daily. She currently denies signs of infection. In office ABIs were 0.57 on the left however dorsalis pedal pulses and posterior tibialis pulses were heard on Doppler. 8/6; patient presents for follow-up. We have been using antibiotic ointment with Hydrofera Blue under Kerlix/Coban to the left lower extremity. Hydrofera Blue to the right lower  leg. She came in for nurse visit and wrap change. She reports no issues. She reports that she is scheduled for her ABIs next week. 8/13; patient presents for follow-up. We have been using Hydrofera Blue and Santyl to the left lower extremity under Kerlix/Coban. She has been using Hydrofera Blue to the right upper leg wound. She had ABIs completed yesterday that showed an ABI of 0.91 on the left and 0.92 on the right. She has no issues or complaints today. 8/20; patient presents for follow-up. We have been using Hydrofera Blue and Santyl to the left lower extremity under 3 layer compression and Hydrofera Blue to the right upper leg wound. The right upper leg wound has healed. She feels that the wrap was too tight with the 3 layer. We will go back down to Kerlix/Coban. Other than that she has no issues or complaints. She denies signs of infection. Wounds Appear smaller. ELIANE, HAMMERSMITH ([id number]) [id number] Page 5 of 7 8/27; patient presents for follow-up. We have been using Hydrofera Blue and Santyl to the left lower extremity wounds under Kerlix/Coban. She tolerated this well. She has no  issues or complaints today. Wounds are smaller. 9/3; patient presents for follow-up. We have been using Hydrofera Blue and Santyl to the left lower extremity wound under Kerlix/Coban. Wound is smaller. 9/10; patient presents for follow-up. We have been using Hydrofera Blue and Santyl to the lower extremity under Kerlix/Coban. Wound appears smaller and well-healing. 9/17 continued improvement we have been using Santyl and Hydrofera Blue under kerlix Coban to the left leg wound largely secondary to chronic lymphedema 9/24; continued improvement in the wound size on the left anterior mid tibia. This is in the setting of significant left greater than right lower leg lymphedema. Initially the patient came in with wounds on both legs however the right leg is healed. The wound on the left leg is smaller. We will go ahead and order bilateral juxta lite stockings today 10/1; still a small open wound on the left anterior lower leg in the setting of chronic venous insufficiency and lymphedema. We have been using Santyl and Hydrofera Blue under Kerlix Coban. Everything is closed on the right leg and she has her own juxta lite which she will just the 30/40 mm compression. Objective Constitutional Sitting or standing Blood Pressure is within target range for patient.. Pulse regular and within target range for patient.Marland Kitchen Respirations regular, non-labored and within target range.. Temperature is normal and within the target range for the patient.Marland Kitchen Appears in no distress. Vitals Time Taken: 10:55 AM, Height: 62 in, Weight: 220 lbs, BMI: 40.2, Temperature: 97.6 F, Pulse: 53 bpm, Respiratory Rate: 18 breaths/min, Blood Pressure: 138/58 mmHg. General Notes: Wound exam; small area on the left anterior lower leg. Still requiring debridement with a #3 curette of nonviable subcutaneous tissue I am able to get down to reasonably healthy looking tissue but this keeps recurring. There is no evidence of surrounding  infection Integumentary (Hair, Skin) Wound #12 status is Open. Original cause of wound was Blister. The date acquired was: 06/12/2023. The wound has been in treatment 10 weeks. The wound is located on the [facility] Lower Leg. The wound measures 0.4cm length x 0.4cm width x 0.1cm depth; 0.126cm^2 area and 0.013cm^3 volume. There is no tunneling or undermining noted. There is a medium amount of serosanguineous drainage noted. The wound margin is distinct with the outline attached to the wound base. There is small (1-33%) red, pink granulation within the wound bed. There is  a large (67-100%) amount of necrotic tissue within the wound bed including Adherent Slough. The periwound skin appearance exhibited: Scarring, Hemosiderin Staining. The periwound skin appearance did not exhibit: Callus, Crepitus, Excoriation, Induration, Rash, Dry/Scaly, Maceration, Atrophie Blanche, Cyanosis, Ecchymosis, Mottled, Pallor, Rubor, Erythema. Periwound temperature was noted as No Abnormality. The periwound has tenderness on palpation. Assessment Active Problems ICD-10 Non-pressure chronic ulcer of other part of right lower leg with fat layer exposed Non-pressure chronic ulcer of other part of left lower leg with fat layer exposed Lymphedema, not elsewhere classified Type 2 diabetes mellitus with other skin ulcer Chronic combined systolic (congestive) and diastolic (congestive) heart failure Procedures Wound #12 Pre-procedure diagnosis of Wound #12 is a Diabetic Wound/Ulcer of the Lower Extremity located on the Left,Distal,Anterior Lower Leg .Severity of Tissue Pre Debridement is: Fat layer exposed. There was a Excisional Skin/Subcutaneous Tissue Debridement with a total area of 0.13 sq cm performed by Maxwell Caul., MD. With the following instrument(s): Curette to remove Non-Viable tissue/material. Material removed includes Eschar and Subcutaneous Tissue and after achieving pain control using  Lidocaine 4% Topical Solution. No specimens were taken. A time out was conducted at 11:34, prior to the start of the procedure. A Minimum amount of bleeding was controlled with Pressure. The procedure was tolerated well. Post Debridement Measurements: 0.4cm length x 0.4cm width x 0.1cm depth; 0.013cm^3 volume. Character of Wound/Ulcer Post Debridement is improved. Severity of Tissue Post Debridement is: Fat layer exposed. Post procedure Diagnosis Wound #12: Same as Pre-Procedure Plan Follow-up Appointments: DIANY, FORMOSA ([id number]) [phone].pdf Page 6 of 7 Return Appointment in 1 week. - w/ Dr. Leanord Hawking Tuesday - please make appt. Other: - [employer] - call about your compression stockings. Anesthetic: (In clinic) Topical Lidocaine 4% applied to wound bed Bathing/ Shower/ Hygiene: May shower with protection but do not get wound dressing(s) wet. Protect dressing(s) with water repellant cover (for example, large plastic bag) or a cast cover and may then take shower. Edema Control - Lymphedema / SCD / Other: Elevate legs to the level of the heart or above for 30 minutes daily and/or when sitting for 3-4 times a day throughout the day. Avoid standing for long periods of time. Exercise regularly Compression stocking or Garment 30-40 mm/Hg pressure to: - [employer] will send you out two one per leg juxatlite HD. If compression wraps slide down please call wound center and speak with a nurse. Additional Orders / Instructions: Follow Nutritious Diet - increase protein Juven Shake 1-2 times daily. WOUND #12: - Lower Leg Wound Laterality: Left, Anterior, Distal Cleanser: Soap and Water 1 x Per Week/30 Days Discharge Instructions: May shower and wash wound with dial antibacterial soap and water prior to dressing change. Cleanser: Vashe 5.8 (oz) 1 x Per Week/30 Days Discharge Instructions: Cleanse the wound with Vashe prior to applying a clean dressing using gauze  sponges, not tissue or cotton balls. Peri-Wound Care: Sween Lotion (Moisturizing lotion) 1 x Per Week/30 Days Discharge Instructions: Apply moisturizing lotion as directed Prim Dressing: IODOFLEX 0.9% Cadexomer Iodine Pad 4x6 cm 1 x Per Week/30 Days ary Discharge Instructions: Apply to wound bed as instructed Secondary Dressing: ABD Pad, 8x10 1 x Per Week/30 Days Discharge Instructions: Apply over primary dressing as directed. Secondary Dressing: Woven Gauze Sponge, Non-Sterile 4x4 in 1 x Per Week/30 Days Discharge Instructions: Apply over primary dressing as directed. Com pression Wrap: Kerlix Roll 4.5x3.1 (in/yd) 1 x Per Week/30 Days Discharge Instructions: Apply Kerlix and Coban compression as directed. Com pression  Wrap: Coban Self-Adherent Wrap 4x5 (in/yd) 1 x Per Week/30 Days Discharge Instructions: Apply over Kerlix as directed. 1. Still using Iodoflex/ABDs/kerlix and Coban. 2. Edema on the left leg looks reasonably well-controlled 3. She has a JuxtaLite on the right leg 4. And I believe she has a JuxtaLite for the left leg Electronic Signature(s) Signed: 09/11/2023 9:33:55 AM By: Baltazar Najjar MD Entered By: Baltazar Najjar on 09/10/2023 09:42:04 -------------------------------------------------------------------------------- SuperBill Details Patient Name: Date of Service: [name] 09/10/2023 Medical Record Number: [id number] Patient Account Number: [id number] Date of Birth/Sex: Treating RN: November 14, 1931 (89 y.o. [name] Primary Care Provider: [name] Other Clinician: Referring Provider: Treating Provider/Extender: [name] in Treatment: 10 Diagnosis Coding ICD-10 Codes Code Description [phone] Non-pressure chronic ulcer of other part of right lower leg with fat layer exposed L97.822 Non-pressure chronic ulcer of other part of left lower leg with fat layer exposed I89.0 Lymphedema, not elsewhere  classified E11.622 Type 2 diabetes mellitus with other skin ulcer I50.42 Chronic combined systolic (congestive) and diastolic (congestive) heart failure Facility Procedures : DALILA, ARCA Code: [id number] Paiden G ([id number] Description: 11042 - DEB SUBQ TISSUE 20 SQ CM/< ICD-10 Diagnosis Description L97.822 Non-pressure chronic ulcer of other part of left lower leg with fat layer expo 5) [phone] Modifier: sed [id number] Quantity: 1 df Page 7 of 7 Physician Procedures : CPT4 Code Description Modifier [id number] 11042 - WC PHYS SUBQ TISS 20 SQ CM ICD-10 Diagnosis Description L97.822 Non-pressure chronic ulcer of other part of left lower leg with fat layer exposed Quantity: 1 Electronic Signature(s) Signed: 09/11/2023 9:33:55 AM By: Baltazar Najjar MD Entered By: Baltazar Najjar on 09/10/2023 09:43:03

## 2023-09-12 NOTE — Progress Notes (Signed)
[name], [name] ([id number]) [id number] Page 1 of 8 Visit Report for 09/10/2023 Arrival Information Details Patient Name: Date of Service: [name], [name] 09/10/2023 11:00 A M Medical Record Number: [id number] Patient Account Number: [id number] Date of Birth/Sex: Treating RN: 13-Apr-1931 (89 y.o. [name] Primary Care Olander Friedl: [name] Other Clinician: Referring Aleksei Goodlin: Treating Gal Feldhaus/Extender: [name] in Treatment: 10 Visit Information History Since Last Visit All ordered tests and consults were completed: Yes Patient Arrived: Wheel Chair Added or deleted any medications: No Arrival Time: 10:53 Any new allergies or adverse reactions: No Accompanied By: daughter Had a fall or experienced change in No Transfer Assistance: EasyPivot Patient Lift activities of daily living that may affect Patient Identification Verified: Yes risk of falls: Secondary Verification Process Completed: Yes Signs or symptoms of abuse/neglect since last visito No Patient Requires Transmission-Based Precautions: No Hospitalized since last visit: No Patient Has Alerts: Yes Implantable device outside of the clinic excluding No Patient Alerts: ABIs: R:0.92 L:0.91 8/24 cellular tissue based products placed in the center since last visit: Has Dressing in Place as Prescribed: Yes Pain Present Now: No Electronic Signature(s) Signed: 09/12/2023 4:20:59 PM By: Brenton Grills Entered By: Brenton Grills on 09/10/2023 10:54:58 -------------------------------------------------------------------------------- Encounter Discharge Information Details Patient Name: Date of Service: [name] 09/10/2023 11:00 A M Medical Record Number: [id number] Patient Account Number: [id number] Date of Birth/Sex: Treating RN: 04-06-1931 (89 y.o. [name] Primary Care Kathlee Barnhardt: [name] Other Clinician: Referring  Prisma Decarlo: Treating Jabez Molner/Extender: [name] in Treatment: 10 Encounter Discharge Information Items Post Procedure Vitals Discharge Condition: Stable Temperature (F): 98.1 Ambulatory Status: Walker Pulse (bpm): 76 Discharge Destination: Home Respiratory Rate (breaths/min): 18 Transportation: Private Auto Blood Pressure (mmHg): 132/72 Accompanied By: daughter Schedule Follow-up Appointment: Yes Clinical Summary of Care: Patient Declined Electronic Signature(s) Signed: 09/12/2023 4:20:59 PM By: Brenton Grills Entered By: Brenton Grills on 09/10/2023 11:57:02 [name] ([id number]) [id number] Page 2 of 8 -------------------------------------------------------------------------------- Lower Extremity Assessment Details Patient Name: Date of Service: [name], [name] 09/10/2023 11:00 A M Medical Record Number: [id number] Patient Account Number: [id number] Date of Birth/Sex: Treating RN: [birth year] (89 y.o. [name] Primary Care Ivry Pigue: [name] Other Clinician: Referring Daryel Kenneth: Treating Dimitrious Micciche/Extender: [name] in Treatment: 10 Edema Assessment Assessed: Kyra Searles: No] Franne Forts: No] Edema: [Left: Ye] [Right: s] Calf Left: Right: Point of Measurement: 32 cm From Medial Instep 43 cm 40 cm Ankle Left: Right: Point of Measurement: 11 cm From Medial Instep 21.5 cm 21 cm Vascular Assessment Pulses: Dorsalis Pedis Palpable: [Left:Yes] [Right:Yes] Extremity colors, hair growth, and conditions: Extremity Color: [Left:Normal] Hair Growth on Extremity: [Left:Yes] Temperature of Extremity: [Left:Hot] Capillary Refill: [Left:< 3 seconds] Dependent Rubor: [Left:No No] Toe Nail Assessment Left: Right: Thick: Yes Yes Discolored: Yes Yes Deformed: Yes Yes Improper Length and Hygiene: Yes Yes Electronic Signature(s) Signed: 09/12/2023 4:20:59 PM By: Brenton Grills Entered By: Brenton Grills on 09/10/2023 11:03:10 -------------------------------------------------------------------------------- Multi Wound Chart Details Patient Name: Date of Service: [name] 09/10/2023 11:00 A M Medical Record Number: [id number] Patient Account Number: [id number] Date of Birth/Sex: Treating RN: December 08, 1930 (89 y.o. F) Primary Care Lennyn Gange: [name] Other Clinician: Referring Kayal Mula: Treating Dhanvi Boesen/Extender: [name] in Treatment: 10 Vital Signs Height(in): 62 Pulse(bpm): 53 Weight(lbs): 220 Blood Pressure(mmHg): 138/58 Body Mass Index(BMI): 40.2 [name], [name] ([id number]) [phone].pdf Page 3 of 8 Temperature(F): 97.6 Respiratory Rate(breaths/min): 18 [12:Photos:] [N/A:N/A] Left, Distal,  Anterior Lower Leg N/A N/A Wound Location: Blister N/A N/A Wounding Event: 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 Lymphedema N/A N/A Secondary Etiology: Anemia, Congestive Heart Failure, N/A N/A Comorbid History: Hypertension, Peripheral Venous Disease, Type II Diabetes, Osteoarthritis, Neuropathy 06/12/2023 N/A N/A Date Acquired: 10 N/A N/A Weeks of Treatment: Open N/A N/A Wound Status: No N/A N/A Wound Recurrence: 0.4x0.4x0.1 N/A N/A Measurements L x W x D (cm) 0.126 N/A N/A A (cm) : rea 0.013 N/A N/A Volume (cm) : 96.70% N/A N/A % Reduction in A rea: 96.60% N/A N/A % Reduction in Volume: Grade 1 N/A N/A Classification: Medium N/A N/A Exudate A mount: Serosanguineous N/A N/A Exudate Type: red, brown N/A N/A Exudate Color: Distinct, outline attached N/A N/A Wound Margin: Small (1-33%) N/A N/A Granulation A mount: Red, Pink N/A N/A Granulation Quality: Large (67-100%) N/A N/A Necrotic A mount: Fascia: No N/A N/A Exposed Structures: Fat Layer (Subcutaneous Tissue): No Tendon: No Muscle: No Joint: No Bone: No Large  (67-100%) N/A N/A Epithelialization: Debridement - Excisional N/A N/A Debridement: Pre-procedure Verification/Time Out 11:34 N/A N/A Taken: Lidocaine 4% Topical Solution N/A N/A Pain Control: Necrotic/Eschar, Subcutaneous N/A N/A Tissue Debrided: Skin/Subcutaneous Tissue N/A N/A Level: 0.13 N/A N/A Debridement A (sq cm): rea Curette N/A N/A Instrument: Minimum N/A N/A Bleeding: Pressure N/A N/A Hemostasis A chieved: Procedure was tolerated well N/A N/A Debridement Treatment Response: 0.4x0.4x0.1 N/A N/A Post Debridement Measurements L x W x D (cm) 0.013 N/A N/A Post Debridement Volume: (cm) Scarring: Yes N/A N/A Periwound Skin Texture: Excoriation: No Induration: No Callus: No Crepitus: No Rash: No Maceration: No N/A N/A Periwound Skin Moisture: Dry/Scaly: No Hemosiderin Staining: Yes N/A N/A Periwound Skin Color: Atrophie Blanche: No Cyanosis: No Ecchymosis: No Erythema: No Mottled: No Pallor: No Rubor: No No Abnormality N/A N/A Temperature: Yes N/A N/A Tenderness on Palpation: Debridement N/A N/A Procedures Performed: Treatment Notes [name], [name] ([id number]) [phone].pdf Page 4 of 8 Wound #12 (Lower Leg) Wound Laterality: Left, Anterior, Distal Cleanser Soap and Water Discharge Instruction: May shower and wash wound with dial antibacterial soap and water prior to dressing change. Vashe 5.8 (oz) Discharge Instruction: Cleanse the wound with Vashe prior to applying a clean dressing using gauze sponges, not tissue or cotton balls. Peri-Wound Care Sween Lotion (Moisturizing lotion) Discharge Instruction: Apply moisturizing lotion as directed Topical Primary Dressing IODOFLEX 0.9% Cadexomer Iodine Pad 4x6 cm Discharge Instruction: Apply to wound bed as instructed Secondary Dressing ABD Pad, 8x10 Discharge Instruction: Apply over primary dressing as directed. Woven Gauze Sponge, Non-Sterile 4x4 in Discharge Instruction:  Apply over primary dressing as directed. Secured With Compression Wrap Kerlix Roll 4.5x3.1 (in/yd) Discharge Instruction: Apply Kerlix and Coban compression as directed. Coban Self-Adherent Wrap 4x5 (in/yd) Discharge Instruction: Apply over Kerlix as directed. Compression Stockings Add-Ons Electronic Signature(s) Signed: 09/11/2023 9:33:55 AM By: Baltazar Najjar MD Entered By: Baltazar Najjar on 09/10/2023 12:36:37 -------------------------------------------------------------------------------- Multi-Disciplinary Care Plan Details Patient Name: Date of Service: [name] 09/10/2023 11:00 A M Medical Record Number: [id number] Patient Account Number: [id number] Date of Birth/Sex: Treating RN: 1931/05/07 (89 y.o. [name] Primary Care Shown Dissinger: [name] Other Clinician: Referring Thunder Bridgewater: Treating Vona Whiters/Extender: [name] in Treatment: 10 Active Inactive Nutrition Nursing Diagnoses: Potential for alteratiion in Nutrition/Potential for imbalanced nutrition Goals: Patient/caregiver agrees to and verbalizes understanding of need to obtain nutritional consultation Date Initiated: 07/02/2023 Date Inactivated: 07/30/2023 Target Resolution Date: 08/02/2023 Goal Status: Met Patient/caregiver will maintain therapeutic glucose control Date  Initiated: 07/02/2023 Target Resolution Date: 10/05/2023 Goal Status: Active [name], [name] ([id number]) [phone].pdf Page 5 of 8 Interventions: Assess HgA1c results as ordered upon admission and as needed Provide education on elevated blood sugars and impact on wound healing Provide education on nutrition Treatment Activities: Patient referred to Primary Care Physician for further nutritional evaluation : 07/02/2023 Notes: Pain, Acute or Chronic Nursing Diagnoses: Pain, acute or chronic: actual or potential Potential alteration in comfort, pain Goals: Patient  will verbalize adequate pain control and receive pain control interventions during procedures as needed Date Initiated: 07/02/2023 Date Inactivated: 07/30/2023 Target Resolution Date: 08/02/2023 Goal Status: Met Patient/caregiver will verbalize comfort level met Date Initiated: 07/02/2023 Target Resolution Date: 10/05/2023 Goal Status: Active Interventions: Encourage patient to take pain medications as prescribed Provide education on pain management Reposition patient for comfort Treatment Activities: Administer pain control measures as ordered : 07/02/2023 Notes: Wound/Skin Impairment Nursing Diagnoses: Knowledge deficit related to ulceration/compromised skin integrity Goals: Patient/caregiver will verbalize understanding of skin care regimen Date Initiated: 07/02/2023 Target Resolution Date: 10/01/2023 Goal Status: Active Interventions: Assess patient/caregiver ability to perform ulcer/skin care regimen upon admission and as needed Assess ulceration(s) every visit Provide education on ulcer and skin care Treatment Activities: Skin care regimen initiated : 07/02/2023 Topical wound management initiated : 07/02/2023 Notes: Electronic Signature(s) Signed: 09/12/2023 4:20:59 PM By: Brenton Grills Entered By: Brenton Grills on 09/10/2023 11:05:17 -------------------------------------------------------------------------------- Pain Assessment Details Patient Name: Date of Service: [name], [name] 09/10/2023 11:00 A M Medical Record Number: [id number] Patient Account Number: [id number] Date of Birth/Sex: Treating RN: 1931-11-11 (89 y.o. [name] Primary Care Marylynn Rigdon: [name] Other Clinician: Referring Chon Buhl: Treating Essa Malachi/Extender: [name] in Treatment: [address], [place] Reece Agar ([id number]) [id number] Page 6 of 8 Active Problems Location of Pain Severity and Description of Pain Patient Has Paino  No Site Locations Pain Management and Medication Current Pain Management: Electronic Signature(s) Signed: 09/12/2023 4:20:59 PM By: Brenton Grills Entered By: Brenton Grills on 09/10/2023 10:55:56 -------------------------------------------------------------------------------- Patient/Caregiver Education Details Patient Name: Date of Service: [name] 10/8/2024andnbsp11:00 A M Medical Record Number: [id number] Patient Account Number: [id number] Date of Birth/Gender: Treating RN: 03/15/1931 (89 y.o. [name] Primary Care Physician: [name] Other Clinician: Referring Physician: Treating Physician/Extender: [name] in Treatment: 10 Education Assessment Education Provided To: Patient and Caregiver Education Topics Provided Wound/Skin Impairment: Methods: Explain/Verbal Responses: State content correctly Electronic Signature(s) Signed: 09/12/2023 4:20:59 PM By: Brenton Grills Entered By: Brenton Grills on 09/10/2023 11:06:00 [name] ([id number]) [id number] Page 7 of 8 -------------------------------------------------------------------------------- Wound Assessment Details Patient Name: Date of Service: [name], [name] 09/10/2023 11:00 A M Medical Record Number: [id number] Patient Account Number: [id number] Date of Birth/Sex: Treating RN: March 04, 1931 (89 y.o. [name] Primary Care Rahaf Carbonell: [name] Other Clinician: Referring Tahliyah Anagnos: Treating Hillery Zachman/Extender: Charlann Lange Weeks in Treatment: 10 Wound Status Wound Number: 12 Primary Diabetic Wound/Ulcer of the Lower Extremity Etiology: Wound Location: Left, Distal, Anterior Lower Leg Secondary Lymphedema Wounding Event: Blister Etiology: Date Acquired: 06/12/2023 Wound Open Weeks Of Treatment: 10 Status: Clustered Wound: No Comorbid Anemia, Congestive Heart Failure,  Hypertension, Peripheral History: Venous Disease, Type II Diabetes, Osteoarthritis, Neuropathy Photos Wound Measurements Length: (cm) Width: (cm) Depth: (cm) Area: (cm) Volume: (cm) 0.4 % Reduction in Area: 96.7% 0.4 % Reduction in Volume: 96.6% 0.1 Epithelialization: Large (67-100%) 0.126 Tunneling: No 0.013 Undermining: No Wound Description Classification: Grade 1 Wound Margin: Distinct, outline attached Exudate Amount:  Medium Exudate Type: Serosanguineous Exudate Color: red, brown Foul Odor After Cleansing: No Slough/Fibrino Yes Wound Bed Granulation Amount: Small (1-33%) Exposed Structure Granulation Quality: Red, Pink Fascia Exposed: No Necrotic Amount: Large (67-100%) Fat Layer (Subcutaneous Tissue) Exposed: No Necrotic Quality: Adherent Slough Tendon Exposed: No Muscle Exposed: No Joint Exposed: No Bone Exposed: No Periwound Skin Texture Texture Color No Abnormalities Noted: No No Abnormalities Noted: No Callus: No Atrophie Blanche: No Crepitus: No Cyanosis: No Excoriation: No Ecchymosis: No Induration: No Erythema: No Rash: No Hemosiderin Staining: Yes Scarring: Yes Mottled: No Pallor: No Moisture Rubor: No No Abnormalities Noted: No Dry / Scaly: No Temperature / Pain Maceration: No Temperature: No Abnormality Tenderness on Palpation: Yes Treatment Notes [name], [name] ([id number]) [phone].pdf Page 8 of 8 Wound #12 (Lower Leg) Wound Laterality: Left, Anterior, Distal Cleanser Soap and Water Discharge Instruction: May shower and wash wound with dial antibacterial soap and water prior to dressing change. Vashe 5.8 (oz) Discharge Instruction: Cleanse the wound with Vashe prior to applying a clean dressing using gauze sponges, not tissue or cotton balls. Peri-Wound Care Sween Lotion (Moisturizing lotion) Discharge Instruction: Apply moisturizing lotion as directed Topical Primary Dressing IODOFLEX 0.9% Cadexomer  Iodine Pad 4x6 cm Discharge Instruction: Apply to wound bed as instructed Secondary Dressing ABD Pad, 8x10 Discharge Instruction: Apply over primary dressing as directed. Woven Gauze Sponge, Non-Sterile 4x4 in Discharge Instruction: Apply over primary dressing as directed. Secured With Compression Wrap Kerlix Roll 4.5x3.1 (in/yd) Discharge Instruction: Apply Kerlix and Coban compression as directed. Coban Self-Adherent Wrap 4x5 (in/yd) Discharge Instruction: Apply over Kerlix as directed. Compression Stockings Add-Ons Electronic Signature(s) Signed: 09/10/2023 5:32:42 PM By: [name] RN, BSN Signed: 09/12/2023 4:20:59 PM By: Brenton Grills Entered By: [name] on 09/10/2023 11:08:32 -------------------------------------------------------------------------------- Vitals Details Patient Name: Date of Service: [name] 09/10/2023 11:00 A M Medical Record Number: [id number] Patient Account Number: [id number] Date of Birth/Sex: Treating RN: Jun 02, 1931 (89 y.o. [name] Primary Care Dyer Klug: [name] Other Clinician: Referring Martavis Gurney: Treating Hayli Milligan/Extender: [name] in Treatment: 10 Vital Signs Time Taken: 10:55 Temperature (F): 97.6 Height (in): 62 Pulse (bpm): 53 Weight (lbs): 220 Respiratory Rate (breaths/min): 18 Body Mass Index (BMI): 40.2 Blood Pressure (mmHg): 138/58 Reference Range: 80 - 120 mg / dl Electronic Signature(s) Signed: 09/12/2023 4:20:59 PM By: Brenton Grills Entered By: Brenton Grills on 09/10/2023 10:55:47

## 2023-09-17 ENCOUNTER — Encounter (HOSPITAL_BASED_OUTPATIENT_CLINIC_OR_DEPARTMENT_OTHER): Payer: Medicare PPO | Admitting: Internal Medicine

## 2023-09-17 DIAGNOSIS — E11622 Type 2 diabetes mellitus with other skin ulcer: Secondary | ICD-10-CM | POA: Diagnosis not present

## 2023-09-18 NOTE — Progress Notes (Signed)
Katherine, [name] ([id number]) [id number] Page 1 of 6 Visit Report for 09/17/2023 HPI Details Patient Name: Date of Service: [name], [name] 09/17/2023 3:15 PM Medical Record Number: [id number] Patient Account Number: [id number] Date of Birth/Sex: Treating RN: [birth year] (89 y.o. F) Primary Care Provider: [name] Other Clinician: Referring Provider: Treating Provider/Extender: [name] in Treatment: 11 History of Present Illness HPI Description: 07/02/2023 Ms. 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 She is not quite sure how the right upper leg wound started. The left lower extremity wound started out as blisters. She has compression stockings but has not been wearing them. She takes Lasix 20 mg daily. She currently denies signs of infection. In office ABIs were 0.57 on the left however dorsalis pedal pulses and posterior tibialis pulses were heard on Doppler. 8/6; patient presents for follow-up. We have been using antibiotic ointment with Hydrofera Blue under Kerlix/Coban to the left lower extremity. Hydrofera Blue to the right lower leg. She came in for nurse visit and wrap change. She reports no issues. She reports that she is scheduled for her ABIs next week. 8/13; patient presents for follow-up. We have been using Hydrofera Blue and Santyl to the left lower extremity under Kerlix/Coban. She has been using Hydrofera Blue to the right upper leg wound. She had ABIs completed yesterday that showed an ABI of 0.91 on the left and 0.92 on the right. She has no issues or complaints today. 8/20; patient presents for follow-up. We have been using Hydrofera Blue and Santyl to the left  lower extremity under 3 layer compression and Hydrofera Blue to the right upper leg wound. The right upper leg wound has healed. She feels that the wrap was too tight with the 3 layer. We will go back down to Kerlix/Coban. Other than that she has no issues or complaints. She denies signs of infection. Wounds Appear smaller. 8/27; patient presents for follow-up. We have been using Hydrofera Blue and Santyl to the left lower extremity wounds under Kerlix/Coban. She tolerated this well. She has no issues or complaints today. Wounds are smaller. 9/3; patient presents for follow-up. We have been using Hydrofera Blue and Santyl to the left lower extremity wound under Kerlix/Coban. Wound is smaller. 9/10; patient presents for follow-up. We have been using Hydrofera Blue and Santyl to the lower extremity under Kerlix/Coban. Wound appears smaller and well-healing. 9/17 continued improvement we have been using Santyl and Hydrofera Blue under kerlix Coban to the left leg wound largely secondary to chronic lymphedema 9/24; continued improvement in the wound size on the left anterior mid tibia. This is in the setting of significant left greater than right lower leg lymphedema. Initially the patient came in with wounds on both legs however the right leg is healed. The wound on the left leg is smaller. We will go ahead and order bilateral juxta lite stockings today 10/1; still a small open wound on the left anterior lower leg in the setting of chronic venous insufficiency and lymphedema. We have been using Santyl and Hydrofera Blue under Kerlix Coban. Everything is closed on the right leg and she has her own juxta lite which she will just the 30/40 mm compression. 10/15; still a small punched-out area on the left anterior lower leg in the setting  of chronic venous insufficiency and lymphedema. I changed her to Iodoflex last week. She is using juxta lites to the right leg and so far things seem stable  here. Electronic Signature(s) Signed: 09/17/2023 4:40:46 PM By: Baltazar Najjar MD Entered By: Baltazar Najjar on 09/17/2023 16:13:37 -------------------------------------------------------------------------------- Physical Exam Details Patient Name: Date of Service: [name], Katherine 09/17/2023 3:15 PM Medical Record Number: [id number] Patient Account Number: [id number] Date of Birth/Sex: Treating RN: 11/11/1931 (89 y.o. F) Primary Care Provider: [name] Other Clinician: Referring Provider: Treating Provider/Extender: [name] in Treatment: [address] [place] Reece Agar ([id number]) [id number] Page 2 of 6 Constitutional Patient is hypertensive.. Pulse regular and within target range for patient.Marland Kitchen Respirations regular, non-labored and within target range.. Temperature is normal and within the target range for the patient.Marland Kitchen Appears in no distress. Notes Wound exam; small area on the left anterior lower leg no debridement at this point although the surface is not completely viable. No evidence of surrounding infection. The edema is well-controlled Electronic Signature(s) Signed: 09/17/2023 4:40:46 PM By: Baltazar Najjar MD Entered By: Baltazar Najjar on 09/17/2023 16:14:35 -------------------------------------------------------------------------------- Physician Orders Details Patient Name: Date of Service: JERA, HEADINGS 09/17/2023 3:15 PM Medical Record Number: [id number] Patient Account Number: [id number] Date of Birth/Sex: Treating RN: [birth year] (89 y.o. [name] Primary Care Provider: [name] Other Clinician: Referring Provider: Treating Provider/Extender: [name] in Treatment: 11 The following information was scribed by: Brenton Grills The information was scribed for: Baltazar Najjar Verbal / Phone Orders: No Diagnosis Coding Follow-up  Appointments ppointment in 1 week. - w/ Dr. Leanord Hawking Tuesday - please make appt. Return A Other: - [employer] - call about your compression stockings. Anesthetic (In clinic) Topical Lidocaine 4% applied to wound bed Bathing/ Shower/ Hygiene May shower with protection but do not get wound dressing(s) wet. Protect dressing(s) with water repellant cover (for example, large plastic bag) or a cast cover and may then take shower. Edema Control - Lymphedema / SCD / Other Elevate legs to the level of the heart or above for 30 minutes daily and/or when sitting for 3-4 times a day throughout the day. A void standing for long periods of time. Exercise regularly Compression stocking or Garment 30-40 mm/Hg pressure to: - [employer] will send you out two one per leg juxatlite HD. If compression wraps slide down please call wound center and speak with a nurse. Additional Orders / Instructions Follow Nutritious Diet - increase protein Juven Shake 1-2 times daily. Wound Treatment Wound #12 - Lower Leg Wound Laterality: Left, Anterior, Distal Cleanser: Soap and Water 1 x Per Week/30 Days Discharge Instructions: May shower and wash wound with dial antibacterial soap and water prior to dressing change. Cleanser: Vashe 5.8 (oz) 1 x Per Week/30 Days Discharge Instructions: Cleanse the wound with Vashe prior to applying a clean dressing using gauze sponges, not tissue or cotton balls. Peri-Wound Care: Sween Lotion (Moisturizing lotion) 1 x Per Week/30 Days Discharge Instructions: Apply moisturizing lotion as directed Prim Dressing: IODOFLEX 0.9% Cadexomer Iodine Pad 4x6 cm 1 x Per Week/30 Days ary Discharge Instructions: Apply to wound bed as instructed Secondary Dressing: ABD Pad, 8x10 1 x Per Week/30 Days Discharge Instructions: Apply over primary dressing as directed. Secondary Dressing: Woven Gauze Sponge, Non-Sterile 4x4 in 1 x Per Week/30 Days Discharge Instructions: Apply over primary dressing as  directed. Compression Wrap: Kerlix Roll 4.5x3.1 (in/yd) 1 x Per Week/30 Days WAYNESHA, RAMMEL ([id number]) [id number] Page  3 of 6 Discharge Instructions: Apply Kerlix and Coban compression as directed. Compression Wrap: Coban Self-Adherent Wrap 4x5 (in/yd) 1 x Per Week/30 Days Discharge Instructions: Apply over Kerlix as directed. Electronic Signature(s) Signed: 09/17/2023 4:40:46 PM By: Baltazar Najjar MD Signed: 09/18/2023 4:07:14 PM By: Brenton Grills Entered By: Brenton Grills on 09/17/2023 16:00:32 -------------------------------------------------------------------------------- Problem List Details Patient Name: Date of Service: RICHA, SHOR 09/17/2023 3:15 PM Medical Record Number: [id number] Patient Account Number: [id number] Date of Birth/Sex: Treating RN: 10-26-1931 (89 y.o. [name] Primary Care Provider: [name] Other Clinician: Referring Provider: Treating Provider/Extender: [name] in Treatment: 11 Active Problems ICD-10 Encounter Code Description Active Date MDM Diagnosis [phone] Non-pressure chronic ulcer of other part of left lower leg with fat layer exposed7/30/2024 No Yes I89.0 Lymphedema, not elsewhere classified 07/02/2023 No Yes E11.622 Type 2 diabetes mellitus with other skin ulcer 07/02/2023 No Yes I50.42 Chronic combined systolic (congestive) and diastolic (congestive) heart failure 07/02/2023 No Yes Inactive Problems ICD-10 Code Description Active Date Inactive Date L97.812 Non-pressure chronic ulcer of other part of right lower leg with fat layer exposed 07/02/2023 07/02/2023 Resolved Problems Electronic Signature(s) Signed: 09/17/2023 4:40:46 PM By: Baltazar Najjar MD Entered By: Baltazar Najjar on 09/17/2023 16:12:35 [name] ([id number]) [id number] Page 4 of  6 -------------------------------------------------------------------------------- Progress Note Details Patient Name: Date of Service: CALLISTA, HOH 09/17/2023 3:15 PM Medical Record Number: [id number] Patient Account Number: [id number] Date of Birth/Sex: Treating RN: [birth year] (89 y.o. F) Primary Care Provider: [name] Other Clinician: Referring Provider: Treating Provider/Extender: [name] in Treatment: 11 Subjective History of Present Illness (HPI) 07/02/2023 Ms. 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 She is not quite sure how the right upper leg wound started. The left lower extremity wound started out as blisters. She has compression stockings but has not been wearing them. She takes Lasix 20 mg daily. She currently denies signs of infection. In office ABIs were 0.57 on the left however dorsalis pedal pulses and posterior tibialis pulses were heard on Doppler. 8/6; patient presents for follow-up. We have been using antibiotic ointment with Hydrofera Blue under Kerlix/Coban to the left lower extremity. Hydrofera Blue to the right lower leg. She came in for nurse visit and wrap change. She reports no issues. She reports that she is scheduled for her ABIs next week. 8/13; patient presents for follow-up. We have been using Hydrofera Blue and Santyl to the left lower extremity under Kerlix/Coban. She has been using Hydrofera Blue to the right upper leg wound. She had ABIs completed yesterday that showed an ABI of 0.91 on the left and 0.92 on the right. She has no issues or complaints today. 8/20; patient presents for follow-up. We have been using Hydrofera Blue and Santyl to the left lower extremity  under 3 layer compression and Hydrofera Blue to the right upper leg wound. The right upper leg wound has healed. She feels that the wrap was too tight with the 3 layer. We will go back down to Kerlix/Coban. Other than that she has no issues or complaints. She denies signs of infection. Wounds Appear smaller. 8/27; patient presents for follow-up. We have been using Hydrofera Blue and Santyl to the left lower extremity wounds under Kerlix/Coban. She tolerated this  well. She has no issues or complaints today. Wounds are smaller. 9/3; patient presents for follow-up. We have been using Hydrofera Blue and Santyl to the left lower extremity wound under Kerlix/Coban. Wound is smaller. 9/10; patient presents for follow-up. We have been using Hydrofera Blue and Santyl to the lower extremity under Kerlix/Coban. Wound appears smaller and well-healing. 9/17 continued improvement we have been using Santyl and Hydrofera Blue under kerlix Coban to the left leg wound largely secondary to chronic lymphedema 9/24; continued improvement in the wound size on the left anterior mid tibia. This is in the setting of significant left greater than right lower leg lymphedema. Initially the patient came in with wounds on both legs however the right leg is healed. The wound on the left leg is smaller. We will go ahead and order bilateral juxta lite stockings today 10/1; still a small open wound on the left anterior lower leg in the setting of chronic venous insufficiency and lymphedema. We have been using Santyl and Hydrofera Blue under Kerlix Coban. Everything is closed on the right leg and she has her own juxta lite which she will just the 30/40 mm compression. 10/15; still a small punched-out area on the left anterior lower leg in the setting of chronic venous insufficiency and lymphedema. I changed her to Iodoflex last week. She is using juxta lites to the right leg and so far things seem stable  here. Objective Constitutional Patient is hypertensive.. Pulse regular and within target range for patient.Marland Kitchen Respirations regular, non-labored and within target range.. Temperature is normal and within the target range for the patient.Marland Kitchen Appears in no distress. Vitals Time Taken: 3:37 PM, Height: 62 in, Weight: 220 lbs, BMI: 40.2, Temperature: 98.1 F, Pulse: 68 bpm, Respiratory Rate: 18 breaths/min, Blood Pressure: 142/92 mmHg. General Notes: Wound exam; small area on the left anterior lower leg no debridement at this point although the surface is not completely viable. No evidence of surrounding infection. The edema is well-controlled Integumentary (Hair, Skin) Wound #12 status is Open. Original cause of wound was Blister. The date acquired was: 06/12/2023. The wound has been in treatment 11 weeks. The wound is located on the [facility] Lower Leg. The wound measures 0.4cm length x 0.4cm width x 0.1cm depth; 0.126cm^2 area and 0.013cm^3 volume. There is no tunneling or undermining noted. There is a medium amount of serosanguineous drainage noted. The wound margin is distinct with the outline attached to the wound base. There is large (67-100%) red granulation within the wound bed. There is no necrotic tissue within the wound bed. The periwound skin appearance exhibited: Scarring, Hemosiderin Staining. The periwound skin appearance did not exhibit: Callus, Crepitus, Excoriation, Induration, Rash, Dry/Scaly, Maceration, Atrophie Blanche, Cyanosis, Ecchymosis, Mottled, Pallor, Rubor, Erythema. Periwound temperature was noted as No Abnormality. The periwound has tenderness on palpation. Assessment THERESA, WEDEL ([id number]) [id number] Page 5 of 6 Active Problems ICD-10 Non-pressure chronic ulcer of other part of left lower leg with fat layer exposed Lymphedema, not elsewhere classified Type 2 diabetes mellitus with other skin ulcer Chronic combined systolic  (congestive) and diastolic (congestive) heart failure Plan Follow-up Appointments: Return Appointment in 1 week. - w/ Dr. Leanord Hawking Tuesday - please make appt. Other: - [employer] - call about your compression stockings. Anesthetic: (In clinic) Topical Lidocaine 4% applied to wound bed Bathing/ Shower/ Hygiene: May shower with protection but do not get wound dressing(s) wet. Protect dressing(s) with water repellant cover (for example, large plastic bag) or a cast cover and may  then take shower. Edema Control - Lymphedema / SCD / Other: Elevate legs to the level of the heart or above for 30 minutes daily and/or when sitting for 3-4 times a day throughout the day. Avoid standing for long periods of time. Exercise regularly Compression stocking or Garment 30-40 mm/Hg pressure to: - [employer] will send you out two one per leg juxatlite HD. If compression wraps slide down please call wound center and speak with a nurse. Additional Orders / Instructions: Follow Nutritious Diet - increase protein Juven Shake 1-2 times daily. WOUND #12: - Lower Leg Wound Laterality: Left, Anterior, Distal Cleanser: Soap and Water 1 x Per Week/30 Days Discharge Instructions: May shower and wash wound with dial antibacterial soap and water prior to dressing change. Cleanser: Vashe 5.8 (oz) 1 x Per Week/30 Days Discharge Instructions: Cleanse the wound with Vashe prior to applying a clean dressing using gauze sponges, not tissue or cotton balls. Peri-Wound Care: Sween Lotion (Moisturizing lotion) 1 x Per Week/30 Days Discharge Instructions: Apply moisturizing lotion as directed Prim Dressing: IODOFLEX 0.9% Cadexomer Iodine Pad 4x6 cm 1 x Per Week/30 Days ary Discharge Instructions: Apply to wound bed as instructed Secondary Dressing: ABD Pad, 8x10 1 x Per Week/30 Days Discharge Instructions: Apply over primary dressing as directed. Secondary Dressing: Woven Gauze Sponge, Non-Sterile 4x4 in 1 x Per Week/30  Days Discharge Instructions: Apply over primary dressing as directed. Com pression Wrap: Kerlix Roll 4.5x3.1 (in/yd) 1 x Per Week/30 Days Discharge Instructions: Apply Kerlix and Coban compression as directed. Com pression Wrap: Coban Self-Adherent Wrap 4x5 (in/yd) 1 x Per Week/30 Days Discharge Instructions: Apply over Kerlix as directed. 1. Still using Iodoflex under ABDs and Kerlix Coban 2. Edema control seems reasonable. Noted 3. If no mechanical debridement this week although that may be necessary next week. Is hard to get anything into this however given the small surface area of the wound Electronic Signature(s) Signed: 09/17/2023 4:40:46 PM By: Baltazar Najjar MD Entered By: Baltazar Najjar on 09/17/2023 16:15:29 -------------------------------------------------------------------------------- SuperBill Details Patient Name: Date of Service: [name] 09/17/2023 Medical Record Number: [id number] Patient Account Number: [id number] Date of Birth/Sex: Treating RN: 1931-11-19 (89 y.o. [name] Primary Care Provider: [name] Other Clinician: Referring Provider: Treating Provider/Extender: [name] in Treatment: [address] Diagnosis Coding ICD-10 Codes DODY, SMARTT ([id number]) [id number] Page 6 of 6 Code Description [phone] Non-pressure chronic ulcer of other part of right lower leg with fat layer exposed L97.822 Non-pressure chronic ulcer of other part of left lower leg with fat layer exposed I89.0 Lymphedema, not elsewhere classified E11.622 Type 2 diabetes mellitus with other skin ulcer I50.42 Chronic combined systolic (congestive) and diastolic (congestive) heart failure Facility Procedures : CPT4 Code: [id number] Description: 99213 - WOUND CARE VISIT-LEV 3 EST PT Modifier: 25 Quantity: 1 Physician Procedures : CPT4 Code Description Modifier [id number] 99213 - WC PHYS LEVEL 3 - EST PT  ICD-10 Diagnosis Description L97.822 Non-pressure chronic ulcer of other part of left lower leg with fat layer exposed I89.0 Lymphedema, not elsewhere classified Quantity: 1 Electronic Signature(s) Signed: 09/17/2023 4:40:46 PM By: Baltazar Najjar MD Entered By: Baltazar Najjar on 09/17/2023 16:15:52

## 2023-09-18 NOTE — Progress Notes (Signed)
[name], [name] ([id number]) [phone].pdf Page 1 of 9 Visit Report for 09/17/2023 Arrival Information Details Patient Name: Date of Service: [name] 09/17/2023 3:15 PM Medical Record Number: [id number] Patient Account Number: [id number] Date of Birth/Sex: Treating RN: 31-May-1931 (89 y.o. F) Primary Care Eman Rynders: [name] Other Clinician: Referring Gad Aymond: Treating Audre Cenci/Extender: [name] in Treatment: 11 Visit Information History Since Last Visit Added or deleted any medications: No Patient Arrived: [name] Any new allergies or adverse reactions: No Arrival Time: 15:34 Had a fall or experienced change in No Accompanied By: friend activities of daily living that may affect Transfer Assistance: None risk of falls: Patient Identification Verified: Yes Signs or symptoms of abuse/neglect since last visito No Secondary Verification Process Completed: Yes Hospitalized since last visit: No Patient Requires Transmission-Based Precautions: No Implantable device outside of the clinic excluding No Patient Has Alerts: Yes cellular tissue based products placed in the center Patient Alerts: ABIs: R:0.92 L:0.91 8/24 since last visit: Has Dressing in Place as Prescribed: Yes Has Compression in Place as Prescribed: Yes Pain Present Now: No Electronic Signature(s) Signed: 09/18/2023 4:39:33 PM By: Thayer Dallas Entered By: Thayer Dallas on 09/17/2023 12:35:26 -------------------------------------------------------------------------------- Clinic Level of Care Assessment Details Patient Name: Date of Service: [name], [name] 09/17/2023 3:15 PM Medical Record Number: [id number] Patient Account Number: [id number] Date of Birth/Sex: Treating RN: 1931/08/21 (89 y.o. Gevena [name] Primary Care Ellwyn Ergle: [name] Other Clinician: Referring Ivadell Gaul: Treating Mckenzie Toruno/Extender: [name] in Treatment: 11 Clinic Level of Care Assessment Items TOOL 4 Quantity Score X- 1 0 Use when only an EandM is performed on FOLLOW-UP visit ASSESSMENTS - Nursing Assessment / Reassessment X- 1 10 Reassessment of Co-morbidities (includes updates in patient status) X- 1 5 Reassessment of Adherence to Treatment Plan ASSESSMENTS - Wound and Skin A ssessment / Reassessment X - Simple Wound Assessment / Reassessment - one wound 1 5 X- 1 5 Complex Wound Assessment / Reassessment - multiple wounds X- 1 10 Dermatologic / Skin Assessment (not related to wound area) ASSESSMENTS - Focused Assessment X- 1 5 Circumferential Edema Measurements - multi extremities []  - 0 Nutritional Assessment / Counseling / Intervention YULIZA, CARA ([id number]) [phone].pdf Page 2 of 9 []  - 0 Lower Extremity Assessment (monofilament, tuning fork, pulses) []  - 0 Peripheral Arterial Disease Assessment (using hand held doppler) ASSESSMENTS - Ostomy and/or Continence Assessment and Care []  - 0 Incontinence Assessment and Management []  - 0 Ostomy Care Assessment and Management (repouching, etc.) PROCESS - Coordination of Care X - Simple Patient / Family Education for ongoing care 1 15 []  - 0 Complex (extensive) Patient / Family Education for ongoing care X- 1 10 Staff obtains Chiropractor, Records, T Results / Process Orders est []  - 0 Staff telephones HHA, Nursing Homes / Clarify orders / etc []  - 0 Routine Transfer to another Facility (non-emergent condition) []  - 0 Routine Hospital Admission (non-emergent condition) []  - 0 New Admissions / Manufacturing engineer / Ordering NPWT Apligraf, etc. , []  - 0 Emergency Hospital Admission (emergent condition) X- 1 10 Simple Discharge Coordination []  - 0 Complex (extensive) Discharge Coordination PROCESS - Special Needs []  - 0 Pediatric / Minor Patient Management []  - 0 Isolation Patient  Management []  - 0 Hearing / Language / Visual special needs []  - 0 Assessment of Community assistance (transportation, D/C planning, etc.) []  - 0 Additional assistance / Altered mentation []  - 0 Support Surface(s) Assessment (bed, cushion, seat, etc.)  INTERVENTIONS - Wound Cleansing / Measurement X - Simple Wound Cleansing - one wound 1 5 []  - 0 Complex Wound Cleansing - multiple wounds X- 1 5 Wound Imaging (photographs - any number of wounds) []  - 0 Wound Tracing (instead of photographs) X- 1 5 Simple Wound Measurement - one wound []  - 0 Complex Wound Measurement - multiple wounds INTERVENTIONS - Wound Dressings X - Small Wound Dressing one or multiple wounds 1 10 []  - 0 Medium Wound Dressing one or multiple wounds []  - 0 Large Wound Dressing one or multiple wounds X- 1 5 Application of Medications - topical []  - 0 Application of Medications - injection INTERVENTIONS - Miscellaneous []  - 0 External ear exam []  - 0 Specimen Collection (cultures, biopsies, blood, body fluids, etc.) []  - 0 Specimen(s) / Culture(s) sent or taken to Lab for analysis []  - 0 Patient Transfer (multiple staff / Nurse, adult / Similar devices) []  - 0 Simple Staple / Suture removal (25 or less) []  - 0 Complex Staple / Suture removal (26 or more) []  - 0 Hypo / Hyperglycemic Management (close monitor of Blood Glucose) Hally, Anahla G ([id number]) [id number].pdf Page 3 of 9 []  - 0 Ankle / Brachial Index (ABI) - do not check if billed separately X- 1 5 Vital Signs Has the patient been seen at the hospital within the last three years: Yes Total Score: 110 Level Of Care: New/Established - Level 3 Electronic Signature(s) Signed: 09/18/2023 4:07:14 PM By: Brenton Grills Entered By: Brenton Grills on 09/17/2023 13:09:38 -------------------------------------------------------------------------------- Encounter Discharge Information Details Patient Name: Date of  Service: [name] 09/17/2023 3:15 PM Medical Record Number: [id number] Patient Account Number: [id number] Date of Birth/Sex: Treating RN: [birth year] (89 y.o. Gevena [name] Primary Care Cadey Bazile: [name] Other Clinician: Referring Blayden Conwell: Treating Tauno Falotico/Extender: [name] in Treatment: 11 Encounter Discharge Information Items Discharge Condition: Stable Ambulatory Status: Walker Discharge Destination: Home Transportation: Private Auto Accompanied By: daughter Schedule Follow-up Appointment: Yes Clinical Summary of Care: Patient Declined Electronic Signature(s) Signed: 09/18/2023 4:07:14 PM By: Brenton Grills Entered By: Brenton Grills on 09/17/2023 13:22:16 -------------------------------------------------------------------------------- Lower Extremity Assessment Details Patient Name: Date of Service: KENI, WAFER 09/17/2023 3:15 PM Medical Record Number: [id number] Patient Account Number: [id number] Date of Birth/Sex: Treating RN: 12-Feb-1931 (89 y.o. F) Primary Care Ples Trudel: [name] Other Clinician: Referring Iantha Titsworth: Treating Rosha Cocker/Extender: [name] in Treatment: 11 Edema Assessment Assessed: Kyra Searles: No] [Right: No] Edema: [Left: Ye] [Right: s] Calf Left: Right: Point of Measurement: 32 cm From Medial Instep 41 cm 40 cm Ankle Left: Right: Point of Measurement: 11 cm From Medial Instep 22 cm 21 cm Vascular Assessment Left: [[id number] Page 4 of 9Right:] Extremity colors, hair growth, and conditions: Extremity Color: [id number].pdf Page 4 of 9Normal] Hair Growth on Extremity: [phone].pdf Page 4 of 9Yes] Temperature of Extremity: [phone].pdf Page 4 of 9Hot] Capillary Refill: [id number].pdf Page 4 of 9< 3 seconds] Dependent Rubor:  [phone].pdf Page 4 of 9No No] Electronic Signature(s) Signed: 09/18/2023 4:39:33 PM By: Thayer Dallas Entered By: Thayer Dallas on 09/17/2023 12:45:34 -------------------------------------------------------------------------------- Multi Wound Chart Details Patient Name: Date of Service: [name] 09/17/2023 3:15 PM Medical Record Number: [id number] Patient Account Number: [id number] Date of Birth/Sex: Treating RN: 1931-05-19 (89 y.o. F) Primary Care Ninette Cotta: [name] Other Clinician: Referring Santana Edell: Treating Cori Justus/Extender: [name] in Treatment: 11 Vital Signs Height(in): 62 Pulse(bpm): 68 Weight(lbs): 220 Blood Pressure(mmHg): 142/92 Body Mass Index(BMI):  40.2 Temperature(F): 98.1 Respiratory Rate(breaths/min): 18 [12:Photos:] [N/A:N/A] Left, Distal, Anterior Lower Leg N/A N/A Wound Location: Blister N/A N/A Wounding Event: 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 Lymphedema N/A N/A Secondary Etiology: Anemia, Congestive Heart Failure, N/A N/A Comorbid History: Hypertension, Peripheral Venous Disease, Type II Diabetes, Osteoarthritis, Neuropathy 06/12/2023 N/A N/A Date Acquired: 11 N/A N/A Weeks of Treatment: Open N/A N/A Wound Status: No N/A N/A Wound Recurrence: 0.4x0.4x0.1 N/A N/A Measurements L x W x D (cm) 0.126 N/A N/A A (cm) : rea 0.013 N/A N/A Volume (cm) : 96.70% N/A N/A % Reduction in A rea: 96.60% N/A N/A % Reduction in Volume: Grade 1 N/A N/A Classification: Medium N/A N/A Exudate A mount: Serosanguineous N/A N/A Exudate Type: red, brown N/A N/A Exudate Color: Distinct, outline attached N/A N/A Wound Margin: Large (67-100%) N/A N/A Granulation A mount: Red N/A N/A Granulation Quality: None Present (0%) N/A N/A Necrotic A mount: Fascia: No N/A N/A Exposed Structures: Fat Layer (Subcutaneous Tissue): No Tendon:  No KIONI, STAHL G ([id number]) [id number] Page 5 of 9 Muscle: No Joint: No Bone: No Large (67-100%) N/A N/A Epithelialization: Scarring: Yes N/A N/A Periwound Skin Texture: Excoriation: No Induration: No Callus: No Crepitus: No Rash: No Maceration: No N/A N/A Periwound Skin Moisture: Dry/Scaly: No Hemosiderin Staining: Yes N/A N/A Periwound Skin Color: Atrophie Blanche: No Cyanosis: No Ecchymosis: No Erythema: No Mottled: No Pallor: No Rubor: No No Abnormality N/A N/A Temperature: Yes N/A N/A Tenderness on Palpation: Treatment Notes Electronic Signature(s) Signed: 09/17/2023 4:40:46 PM By: Baltazar Najjar MD Entered By: Baltazar Najjar on 09/17/2023 13:12:43 -------------------------------------------------------------------------------- Multi-Disciplinary Care Plan Details Patient Name: Date of Service: [name] 09/17/2023 3:15 PM Medical Record Number: [id number] Patient Account Number: [id number] Date of Birth/Sex: Treating RN: Apr 02, 1931 (89 y.o. Gevena [name] Primary Care Thomes Burak: [name] Other Clinician: Referring Charle Clear: Treating Riggins Cisek/Extender: [name] in Treatment: 11 Active Inactive Nutrition Nursing Diagnoses: Potential for alteratiion in Nutrition/Potential for imbalanced nutrition Goals: Patient/caregiver agrees to and verbalizes understanding of need to obtain nutritional consultation Date Initiated: 07/02/2023 Date Inactivated: 07/30/2023 Target Resolution Date: 08/02/2023 Goal Status: Met Patient/caregiver will maintain therapeutic glucose control Date Initiated: 07/02/2023 Target Resolution Date: 10/05/2023 Goal Status: Active Interventions: Assess HgA1c results as ordered upon admission and as needed Provide education on elevated blood sugars and impact on wound healing Provide education on nutrition Treatment Activities: Patient referred to  Primary Care Physician for further nutritional evaluation : 07/02/2023 Notes: Pain, Acute or Chronic Nursing Diagnoses: Pain, acute or chronic: actual or potential Potential alteration in comfort, pain LAVILLA, DELAMORA ([id number]) [phone].pdf Page 6 of 9 Goals: Patient will verbalize adequate pain control and receive pain control interventions during procedures as needed Date Initiated: 07/02/2023 Date Inactivated: 07/30/2023 Target Resolution Date: 08/02/2023 Goal Status: Met Patient/caregiver will verbalize comfort level met Date Initiated: 07/02/2023 Target Resolution Date: 10/05/2023 Goal Status: Active Interventions: Encourage patient to take pain medications as prescribed Provide education on pain management Reposition patient for comfort Treatment Activities: Administer pain control measures as ordered : 07/02/2023 Notes: Wound/Skin Impairment Nursing Diagnoses: Knowledge deficit related to ulceration/compromised skin integrity Goals: Patient/caregiver will verbalize understanding of skin care regimen Date Initiated: 07/02/2023 Target Resolution Date: 10/01/2023 Goal Status: Active Interventions: Assess patient/caregiver ability to perform ulcer/skin care regimen upon admission and as needed Assess ulceration(s) every visit Provide education on ulcer and skin care Treatment Activities: Skin care regimen initiated : 07/02/2023 Topical wound management initiated : 07/02/2023 Notes: Electronic  Signature(s) Signed: 09/18/2023 4:07:14 PM By: Brenton Grills Entered By: Brenton Grills on 09/17/2023 13:00:38 -------------------------------------------------------------------------------- Pain Assessment Details Patient Name: Date of Service: ZAMORIA, BOSS 09/17/2023 3:15 PM Medical Record Number: [id number] Patient Account Number: [id number] Date of Birth/Sex: Treating RN: [birth year] (89 y.o. F) Primary Care Daysha Ashmore: [name] Other  Clinician: Referring Juni Glaab: Treating Folasade Mooty/Extender: [name] in Treatment: 11 Active Problems Location of Pain Severity and Description of Pain Patient Has Paino No Site Locations [place], [place] G ([id number]) [id number] Page 7 of 9 Pain Management and Medication Current Pain Management: Electronic Signature(s) Signed: 09/18/2023 4:39:33 PM By: Thayer Dallas Entered By: Thayer Dallas on 09/17/2023 12:47:58 -------------------------------------------------------------------------------- Patient/Caregiver Education Details Patient Name: Date of Service: [name] 10/15/2024andnbsp3:15 PM Medical Record Number: [id number] Patient Account Number: [id number] Date of Birth/Gender: Treating RN: 1931/05/19 (89 y.o. Gevena [name] Primary Care Physician: [name] Other Clinician: Referring Physician: Treating Physician/Extender: [name] in Treatment: 11 Education Assessment Education Provided To: Patient and Caregiver Education Topics Provided Wound/Skin Impairment: Methods: Explain/Verbal Responses: State content correctly Electronic Signature(s) Signed: 09/18/2023 4:07:14 PM By: Brenton Grills Entered By: Brenton Grills on 09/17/2023 13:00:55 -------------------------------------------------------------------------------- Wound Assessment Details Patient Name: Date of Service: [name], [name] 09/17/2023 3:15 PM Medical Record Number: [id number] Patient Account Number: [id number] Date of Birth/Sex: Treating RN: 06-11-1931 (89 y.o. F) Primary Care Jedd Schulenburg: [name] Other Clinician: Referring Wauneta Silveria: Treating Astha Probasco/Extender: Riyah, [place], [place] ([id number]) [id number] Page 8 of 9 Weeks in Treatment: 11 Wound Status Wound Number: 12 Primary Diabetic Wound/Ulcer of the Lower  Extremity Etiology: Wound Location: Left, Distal, Anterior Lower Leg Secondary Lymphedema Wounding Event: Blister Etiology: Date Acquired: 06/12/2023 Wound Open Weeks Of Treatment: 11 Status: Clustered Wound: No Comorbid Anemia, Congestive Heart Failure, Hypertension, Peripheral History: Venous Disease, Type II Diabetes, Osteoarthritis, Neuropathy Photos Wound Measurements Length: (cm) 0.4 Width: (cm) 0.4 Depth: (cm) 0.1 Area: (cm) 0.126 Volume: (cm) 0.013 % Reduction in Area: 96.7% % Reduction in Volume: 96.6% Epithelialization: Large (67-100%) Tunneling: No Undermining: No Wound Description Classification: Grade 1 Wound Margin: Distinct, outline attached Exudate Amount: Medium Exudate Type: Serosanguineous Exudate Color: red, brown Foul Odor After Cleansing: No Slough/Fibrino Yes Wound Bed Granulation Amount: Large (67-100%) Exposed Structure Granulation Quality: Red Fascia Exposed: No Necrotic Amount: None Present (0%) Fat Layer (Subcutaneous Tissue) Exposed: No Tendon Exposed: No Muscle Exposed: No Joint Exposed: No Bone Exposed: No Periwound Skin Texture Texture Color No Abnormalities Noted: No No Abnormalities Noted: No Callus: No Atrophie Blanche: No Crepitus: No Cyanosis: No Excoriation: No Ecchymosis: No Induration: No Erythema: No Rash: No Hemosiderin Staining: Yes Scarring: Yes Mottled: No Pallor: No Moisture Rubor: No No Abnormalities Noted: No Dry / Scaly: No Temperature / Pain Maceration: No Temperature: No Abnormality Tenderness on Palpation: Yes Treatment Notes Wound #12 (Lower Leg) Wound Laterality: Left, Anterior, Distal Cleanser Soap and Water Discharge Instruction: May shower and wash wound with dial antibacterial soap and water prior to dressing change. Vashe 5.8 (oz) Discharge Instruction: Cleanse the wound with Vashe prior to applying a clean dressing using gauze sponges, not tissue or cotton balls. DELSIE, AMADOR  ([id number]) [phone].pdf Page 9 of 9 Peri-Wound Care Sween Lotion (Moisturizing lotion) Discharge Instruction: Apply moisturizing lotion as directed Topical Primary Dressing IODOFLEX 0.9% Cadexomer Iodine Pad 4x6 cm Discharge Instruction: Apply to wound bed as instructed Secondary Dressing ABD Pad, 8x10 Discharge Instruction: Apply over primary dressing as directed. Woven Gauze Sponge, Non-Sterile  4x4 in Discharge Instruction: Apply over primary dressing as directed. Secured With Compression Wrap Kerlix Roll 4.5x3.1 (in/yd) Discharge Instruction: Apply Kerlix and Coban compression as directed. Coban Self-Adherent Wrap 4x5 (in/yd) Discharge Instruction: Apply over Kerlix as directed. Compression Stockings Add-Ons Electronic Signature(s) Signed: 09/18/2023 4:39:33 PM By: Thayer Dallas Entered By: Thayer Dallas on 09/17/2023 12:47:34 -------------------------------------------------------------------------------- Vitals Details Patient Name: Date of Service: [name] 09/17/2023 3:15 PM Medical Record Number: [id number] Patient Account Number: [id number] Date of Birth/Sex: Treating RN: [birth year] (89 y.o. F) Primary Care Valin Massie: [name] Other Clinician: Referring Mishel Sans: Treating Nani Ingram/Extender: [name] in Treatment: 11 Vital Signs Time Taken: 15:37 Temperature (F): 98.1 Height (in): 62 Pulse (bpm): 68 Weight (lbs): 220 Respiratory Rate (breaths/min): 18 Body Mass Index (BMI): 40.2 Blood Pressure (mmHg): 142/92 Reference Range: 80 - 120 mg / dl Electronic Signature(s) Signed: 09/18/2023 4:39:33 PM By: Thayer Dallas Entered By: Thayer Dallas on 09/17/2023 12:44:27

## 2023-09-20 ENCOUNTER — Telehealth: Payer: Self-pay | Admitting: Cardiovascular Disease

## 2023-09-20 MED ORDER — FUROSEMIDE 40 MG PO TABS: 40.0000 mg | ORAL_TABLET | Freq: Every day | ORAL | 3 refills | Status: DC

## 2023-09-20 NOTE — Telephone Encounter (Signed)
°*  STAT* If patient is at the pharmacy, call can be transferred to refill team.   1. Which medications need to be refilled? (please list name of each medication and dose if known) furosemide (LASIX) 40 MG tablet  2. Which pharmacy/location (including street and city if local pharmacy) is medication to be sent to? [facility] - [place], [place] - [address]  3. Do they need a 30 day or 90 day supply? 90

## 2023-09-20 NOTE — Telephone Encounter (Signed)
Pt's medication was sent to pt's pharmacy as requested. Confirmation received.  °

## 2023-09-24 ENCOUNTER — Encounter (HOSPITAL_BASED_OUTPATIENT_CLINIC_OR_DEPARTMENT_OTHER): Payer: Medicare PPO | Admitting: Internal Medicine

## 2023-09-24 DIAGNOSIS — E11622 Type 2 diabetes mellitus with other skin ulcer: Secondary | ICD-10-CM | POA: Diagnosis not present

## 2023-09-24 NOTE — Progress Notes (Signed)
ADASIA, LEININGER ([id number]) [id number] Page 1 of 7 Visit Report for 09/24/2023 Debridement Details Patient Name: Date of Service: [name], [name] 09/24/2023 11:15 A M Medical Record Number: [id number] Patient Account Number: [id number] Date of Birth/Sex: Treating RN: 1931/05/24 (89 y.o. F) Primary Care Provider: [name] Other Clinician: Referring Provider: Treating Provider/Extender: [name] in Treatment: 12 Debridement Performed for Assessment: Wound #12 Left,Distal,Anterior Lower Leg Performed By: Physician Maxwell Caul., MD The following information was scribed by: [name] The information was scribed for: Baltazar Najjar Debridement Type: Debridement Severity of Tissue Pre Debridement: Fat layer exposed Level of Consciousness (Pre-procedure): Awake and Alert Pre-procedure Verification/Time Out Yes - 11:32 Taken: Start Time: 11:32 Pain Control: Lidocaine Percent of Wound Bed Debrided: 100% T Area Debrided (cm): otal 0.03 Tissue and other material debrided: Viable, Non-Viable, Slough, Slough Level: Non-Viable Tissue Debridement Description: Selective/Open Wound Instrument: Curette Bleeding: Minimum Hemostasis Achieved: Pressure End Time: 11:32 Procedural Pain: 0 Post Procedural Pain: 0 Response to Treatment: Procedure was tolerated well Level of Consciousness (Post- Awake and Alert procedure): Post Debridement Measurements of Total Wound Length: (cm) 0.2 Width: (cm) 0.2 Depth: (cm) 0.1 Volume: (cm) 0.003 Character of Wound/Ulcer Post Debridement: Improved Severity of Tissue Post Debridement: Fat layer exposed Post Procedure Diagnosis Same as Pre-procedure Electronic Signature(s) Signed: 09/24/2023 4:45:38 PM By: Baltazar Najjar MD Entered By: Baltazar Najjar on 09/24/2023 11:43:23 -------------------------------------------------------------------------------- HPI  Details Patient Name: Date of Service: [name] 09/24/2023 11:15 A M Medical Record Number: [id number] Patient Account Number: [id number] Date of Birth/Sex: Treating RN: 1931/03/09 (89 y.o. F) Primary Care Provider: [name] Other Clinician: Referring Provider: Treating Provider/Extender: [name] in Treatment: [address] [place] Reece Agar ([id number]) [id number] Page 2 of 7 History of Present Illness HPI Description: 07/02/2023 Ms. 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 She is not quite sure how the right upper leg wound started. The left lower extremity wound started out as blisters. She has compression stockings but has not been wearing them. She takes Lasix 20 mg daily. She currently denies signs of infection. In office ABIs were 0.57 on the left however dorsalis pedal pulses and posterior tibialis pulses were heard on Doppler. 8/6; patient presents for follow-up. We have been using antibiotic ointment with Hydrofera Blue under Kerlix/Coban to the left lower extremity. Hydrofera Blue to the right lower leg. She came in for nurse visit and wrap change. She reports no issues. She reports that she is scheduled for her ABIs next week. 8/13; patient presents for follow-up. We have been using Hydrofera Blue and Santyl to the left lower extremity under Kerlix/Coban. She has been using Hydrofera Blue to the right upper leg wound. She had ABIs completed yesterday that showed an ABI of 0.91 on the left and 0.92 on the right. She has no issues or complaints today. 8/20; patient presents for follow-up. We have been using Hydrofera Blue and Santyl to the left lower extremity under 3 layer  compression and Hydrofera Blue to the right upper leg wound. The right upper leg wound has healed. She feels that the wrap was too tight with the 3 layer. We will go back down to Kerlix/Coban. Other than that she has no issues or complaints. She denies signs of infection.  Wounds Appear smaller. 8/27; patient presents for follow-up. We have been using Hydrofera Blue and Santyl to the left lower extremity wounds under Kerlix/Coban. She tolerated this well. She has no issues or complaints today. Wounds are smaller. 9/3; patient presents for follow-up. We have been using Hydrofera Blue and Santyl to the left lower extremity wound under Kerlix/Coban. Wound is smaller. 9/10; patient presents for follow-up. We have been using Hydrofera Blue and Santyl to the lower extremity under Kerlix/Coban. Wound appears smaller and well-healing. 9/17 continued improvement we have been using Santyl and Hydrofera Blue under kerlix Coban to the left leg wound largely secondary to chronic lymphedema 9/24; continued improvement in the wound size on the left anterior mid tibia. This is in the setting of significant left greater than right lower leg lymphedema. Initially the patient came in with wounds on both legs however the right leg is healed. The wound on the left leg is smaller. We will go ahead and order bilateral juxta lite stockings today 10/1; still a small open wound on the left anterior lower leg in the setting of chronic venous insufficiency and lymphedema. We have been using Santyl and Hydrofera Blue under Kerlix Coban. Everything is closed on the right leg and she has her own juxta lite which she will just the 30/40 mm compression. 10/15; still a small punched-out area on the left anterior lower leg in the setting of chronic venous insufficiency and lymphedema. I changed her to Iodoflex last week. She is using juxta lites to the right leg and so far things seem stable here. 10/22; same small punched-out area in  the left anterior mid tibia. Setting of chronic venous insufficiency and lymphedema. I changed her to Iodoflex flex last week using juxta lites on the right leg and we are wrapping the left leg. Everything else is healed Electronic Signature(s) Signed: 09/24/2023 4:45:38 PM By: Baltazar Najjar MD Entered By: Baltazar Najjar on 09/24/2023 11:44:36 -------------------------------------------------------------------------------- Physical Exam Details Patient Name: Date of Service: REBACCA, RUMERY 09/24/2023 11:15 A M Medical Record Number: [id number] Patient Account Number: [id number] Date of Birth/Sex: Treating RN: 09-14-1931 (89 y.o. F) Primary Care Provider: [name] Other Clinician: Referring Provider: Treating Provider/Extender: [name] in Treatment: 12 Constitutional Sitting or standing Blood Pressure is within target range for patient.. Pulse regular and within target range for patient.Marland Kitchen Respirations regular, non-labored and within target range.. Temperature is normal and within the target range for the patient.Marland Kitchen Appears in no distress. Notes Wound exam; small area on the left anterior mid tibia. #3 curette aggressive debridement I am able to get the something that looks like decent granulation. Minimal bleeding. No surrounding infection Electronic Signature(s) Signed: 09/24/2023 4:45:38 PM By: Baltazar Najjar MD Entered By: Baltazar Najjar on 09/24/2023 11:46:36 Valentino Saxon, [name] ([id number]) [id number] Page 3 of 7 -------------------------------------------------------------------------------- Physician Orders Details Patient Name: Date of Service: RAKIYA, BUCHBERGER 09/24/2023 11:15 A M Medical Record Number: [id number] Patient Account Number: [id number] Date of Birth/Sex: Treating RN: 1931-10-06 (89 y.o. [name], [name] Primary Care Provider: [name] Other Clinician: Referring  Provider: Treating Provider/Extender: [name] in Treatment: 12 Verbal / Phone Orders: No Diagnosis Coding Follow-up Appointments ppointment in 1 week. - Tuesday 10/01/23 @ 11:15 w/ Dr. Leanord Hawking and Veneda Melter # 9 Return A Other: - [employer] - call about your compression stockings. Anesthetic (In clinic) Topical Lidocaine 4% applied to wound bed Bathing/ Shower/ Hygiene May shower with protection but  do not get wound dressing(s) wet. Protect dressing(s) with water repellant cover (for example, large plastic bag) or a cast cover and may then take shower. Edema Control - Lymphedema / SCD / Other Elevate legs to the level of the heart or above for 30 minutes daily and/or when sitting for 3-4 times a day throughout the day. A void standing for long periods of time. Exercise regularly Compression stocking or Garment 30-40 mm/Hg pressure to: - [employer] will send you out two one per leg juxatlite HD. If compression wraps slide down please call wound center and speak with a nurse. Additional Orders / Instructions Follow Nutritious Diet - increase protein Juven Shake 1-2 times daily. Wound Treatment Wound #12 - Lower Leg Wound Laterality: Left, Anterior, Distal Cleanser: Soap and Water 1 x Per Week/30 Days Discharge Instructions: May shower and wash wound with dial antibacterial soap and water prior to dressing change. Cleanser: Vashe 5.8 (oz) 1 x Per Week/30 Days Discharge Instructions: Cleanse the wound with Vashe prior to applying a clean dressing using gauze sponges, not tissue or cotton balls. Peri-Wound Care: Sween Lotion (Moisturizing lotion) 1 x Per Week/30 Days Discharge Instructions: Apply moisturizing lotion as directed Prim Dressing: IODOFLEX 0.9% Cadexomer Iodine Pad 4x6 cm 1 x Per Week/30 Days ary Discharge Instructions: Apply to wound bed as instructed Secondary Dressing: ABD Pad, 8x10 1 x Per Week/30 Days Discharge Instructions: Apply over  primary dressing as directed. Secondary Dressing: Woven Gauze Sponge, Non-Sterile 4x4 in 1 x Per Week/30 Days Discharge Instructions: Apply over primary dressing as directed. Compression Wrap: Kerlix Roll 4.5x3.1 (in/yd) 1 x Per Week/30 Days Discharge Instructions: Apply Kerlix and Coban compression as directed. Compression Wrap: Coban Self-Adherent Wrap 4x5 (in/yd) 1 x Per Week/30 Days Discharge Instructions: Apply over Kerlix as directed. Electronic Signature(s) Signed: 09/24/2023 4:45:15 PM By: [name] RN Signed: 09/24/2023 4:45:38 PM By: Baltazar Najjar MD Entered By: [name] on 09/24/2023 11:35:18 [name] ([id number]) [id number] Page 4 of 7 -------------------------------------------------------------------------------- Problem List Details Patient Name: Date of Service: DERYKAH, LANZI 09/24/2023 11:15 A M Medical Record Number: [id number] Patient Account Number: [id number] Date of Birth/Sex: Treating RN: Oct 07, 1931 (89 y.o. F) Primary Care Provider: [name] Other Clinician: Referring Provider: Treating Provider/Extender: [name] in Treatment: 12 Active Problems ICD-10 Encounter Code Description Active Date MDM Diagnosis [phone] Non-pressure chronic ulcer of other part of left lower leg with fat layer exposed7/30/2024 No Yes I89.0 Lymphedema, not elsewhere classified 07/02/2023 No Yes E11.622 Type 2 diabetes mellitus with other skin ulcer 07/02/2023 No Yes I50.42 Chronic combined systolic (congestive) and diastolic (congestive) heart failure 07/02/2023 No Yes Inactive Problems ICD-10 Code Description Active Date Inactive Date L97.812 Non-pressure chronic ulcer of other part of right lower leg with fat layer exposed 07/02/2023 07/02/2023 Resolved Problems Electronic Signature(s) Signed: 09/24/2023 4:45:38 PM By: Baltazar Najjar MD Entered By: Baltazar Najjar on 09/24/2023  11:43:04 -------------------------------------------------------------------------------- Progress Note Details Patient Name: Date of Service: [name] 09/24/2023 11:15 A M Medical Record Number: [id number] Patient Account Number: [id number] Date of Birth/Sex: Treating RN: [birth year] (89 y.o. F) Primary Care Provider: [name] Other Clinician: Referring Provider: Treating Provider/Extender: [name] in Treatment: 12 Subjective History of Present Illness (HPI) 07/02/2023 Ms. 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 She is not quite sure how the right COURTNE, SIBLE ([id number]) [id number] Page 5 of 7 upper leg wound started. The left lower extremity wound started out as blisters. She has compression stockings but has not been wearing them. She takes Lasix 20 mg daily. She currently denies signs of infection. In office ABIs were 0.57 on the left however dorsalis pedal pulses and posterior tibialis pulses were heard on Doppler. 8/6; patient presents for follow-up. We have been using antibiotic ointment with Hydrofera Blue under Kerlix/Coban to the left lower extremity. Hydrofera Blue to the right lower leg. She came in for nurse visit and wrap change. She reports no issues. She reports that she is scheduled for her ABIs next week. 8/13; patient presents for follow-up. We have been using Hydrofera Blue and Santyl to the left lower extremity under Kerlix/Coban. She has been using Hydrofera Blue to the right upper leg wound. She had ABIs completed yesterday that showed an ABI of 0.91 on the left and 0.92 on the right. She has no issues or complaints today. 8/20; patient presents  for follow-up. We have been using Hydrofera Blue and Santyl to the left lower extremity under 3 layer compression and Hydrofera Blue to the right upper leg wound. The right upper leg wound has healed. She feels that the wrap was too tight with the 3 layer. We will go back down to Kerlix/Coban. Other than that she has no issues or complaints. She denies signs of infection. Wounds Appear smaller. 8/27; patient presents for follow-up. We have been using Hydrofera Blue and Santyl to the left lower extremity wounds under Kerlix/Coban. She tolerated this well. She has no issues or complaints today. Wounds are smaller. 9/3; patient presents for follow-up. We have been using Hydrofera Blue and Santyl to the left lower extremity wound under Kerlix/Coban. Wound is smaller. 9/10; patient presents for follow-up. We have been using Hydrofera Blue and Santyl to the lower extremity under Kerlix/Coban. Wound appears smaller and well-healing. 9/17 continued improvement we have been using Santyl and Hydrofera Blue under kerlix Coban to the left leg wound largely secondary to chronic lymphedema 9/24; continued improvement in the wound size on the left anterior mid tibia. This is in the setting of significant left greater than right lower leg lymphedema. Initially the patient came in with wounds on both legs however the right leg is healed. The wound on the left leg is smaller. We will go ahead and order bilateral juxta lite stockings today 10/1; still a small open wound on the left anterior lower leg in the setting of chronic venous insufficiency and lymphedema. We have been using Santyl and Hydrofera Blue under Kerlix Coban. Everything is closed on the right leg and she has her own juxta lite which she will just the 30/40 mm compression. 10/15; still a small punched-out area on the left anterior lower leg in the setting of chronic venous insufficiency and lymphedema. I changed her to Iodoflex last week. She is using  juxta lites to the right leg and so far things seem stable here. 10/22; same small punched-out area in the left anterior mid tibia. Setting of chronic venous insufficiency and lymphedema. I changed her to Iodoflex flex last week using juxta lites on the right leg and we are wrapping the left leg. Everything else is healed Objective Constitutional Sitting or standing Blood Pressure is within target range for patient.. Pulse regular and within target range for patient.Marland Kitchen Respirations regular, non-labored and within target  range.. Temperature is normal and within the target range for the patient.Marland Kitchen Appears in no distress. Vitals Time Taken: 11:04 AM, Height: 62 in, Weight: 220 lbs, BMI: 40.2, Temperature: 98 F, Pulse: 55 bpm, Respiratory Rate: 18 breaths/min, Blood Pressure: 138/58 mmHg. General Notes: Wound exam; small area on the left anterior mid tibia. #3 curette aggressive debridement I am able to get the something that looks like decent granulation. Minimal bleeding. No surrounding infection Integumentary (Hair, Skin) Wound #12 status is Open. Original cause of wound was Blister. The date acquired was: 06/12/2023. The wound has been in treatment 12 weeks. The wound is located on the [facility] Lower Leg. The wound measures 0.2cm length x 0.2cm width x 0.1cm depth; 0.031cm^2 area and 0.003cm^3 volume. There is no tunneling or undermining noted. There is a medium amount of serosanguineous drainage noted. The wound margin is distinct with the outline attached to the wound base. There is large (67-100%) red granulation within the wound bed. There is no necrotic tissue within the wound bed. The periwound skin appearance exhibited: Scarring, Hemosiderin Staining. The periwound skin appearance did not exhibit: Callus, Crepitus, Excoriation, Induration, Rash, Dry/Scaly, Maceration, Atrophie Blanche, Cyanosis, Ecchymosis, Mottled, Pallor, Rubor, Erythema. Periwound temperature was noted as No  Abnormality. The periwound has tenderness on palpation. Assessment Active Problems ICD-10 Non-pressure chronic ulcer of other part of left lower leg with fat layer exposed Lymphedema, not elsewhere classified Type 2 diabetes mellitus with other skin ulcer Chronic combined systolic (congestive) and diastolic (congestive) heart failure Procedures CONESHA, ANTONIOU ([id number]) [phone].pdf Page 6 of 7 Wound #12 Pre-procedure diagnosis of Wound #12 is a Diabetic Wound/Ulcer of the Lower Extremity located on the Left,Distal,Anterior Lower Leg .Severity of Tissue Pre Debridement is: Fat layer exposed. There was a Selective/Open Wound Non-Viable Tissue Debridement with a total area of 0.03 sq cm performed by Maxwell Caul., MD. With the following instrument(s): Curette to remove Viable and Non-Viable tissue/material. Material removed includes [facility] after achieving pain control using Lidocaine. No specimens were taken. A time out was conducted at 11:32, prior to the start of the procedure. A Minimum amount of bleeding was controlled with Pressure. The procedure was tolerated well with a pain level of 0 throughout and a pain level of 0 following the procedure. Post Debridement Measurements: 0.2cm length x 0.2cm width x 0.1cm depth; 0.003cm^3 volume. Character of Wound/Ulcer Post Debridement is improved. Severity of Tissue Post Debridement is: Fat layer exposed. Post procedure Diagnosis Wound #12: Same as Pre-Procedure Plan Follow-up Appointments: Return Appointment in 1 week. - Tuesday 10/01/23 @ 11:15 w/ Dr. Leanord Hawking and Veneda Melter # 9 Other: - [employer] - call about your compression stockings. Anesthetic: (In clinic) Topical Lidocaine 4% applied to wound bed Bathing/ Shower/ Hygiene: May shower with protection but do not get wound dressing(s) wet. Protect dressing(s) with water repellant cover (for example, large plastic bag) or a cast cover and may then take  shower. Edema Control - Lymphedema / SCD / Other: Elevate legs to the level of the heart or above for 30 minutes daily and/or when sitting for 3-4 times a day throughout the day. Avoid standing for long periods of time. Exercise regularly Compression stocking or Garment 30-40 mm/Hg pressure to: - [employer] will send you out two one per leg juxatlite HD. If compression wraps slide down please call wound center and speak with a nurse. Additional Orders / Instructions: Follow Nutritious Diet - increase protein Juven Shake 1-2 times daily. WOUND #12: - Lower Leg  Wound Laterality: Left, Anterior, Distal Cleanser: Soap and Water 1 x Per Week/30 Days Discharge Instructions: May shower and wash wound with dial antibacterial soap and water prior to dressing change. Cleanser: Vashe 5.8 (oz) 1 x Per Week/30 Days Discharge Instructions: Cleanse the wound with Vashe prior to applying a clean dressing using gauze sponges, not tissue or cotton balls. Peri-Wound Care: Sween Lotion (Moisturizing lotion) 1 x Per Week/30 Days Discharge Instructions: Apply moisturizing lotion as directed Prim Dressing: IODOFLEX 0.9% Cadexomer Iodine Pad 4x6 cm 1 x Per Week/30 Days ary Discharge Instructions: Apply to wound bed as instructed Secondary Dressing: ABD Pad, 8x10 1 x Per Week/30 Days Discharge Instructions: Apply over primary dressing as directed. Secondary Dressing: Woven Gauze Sponge, Non-Sterile 4x4 in 1 x Per Week/30 Days Discharge Instructions: Apply over primary dressing as directed. Com pression Wrap: Kerlix Roll 4.5x3.1 (in/yd) 1 x Per Week/30 Days Discharge Instructions: Apply Kerlix and Coban compression as directed. Com pression Wrap: Coban Self-Adherent Wrap 4x5 (in/yd) 1 x Per Week/30 Days Discharge Instructions: Apply over Kerlix as directed. 1. Still Iodosorb/ABD under kerlix Coban. 2. For edema control seems to be adequate 3. Hopefully this will begin to close and Electronic Signature(s) Signed:  09/24/2023 4:45:38 PM By: Baltazar Najjar MD Entered By: Baltazar Najjar on 09/24/2023 11:51:35 -------------------------------------------------------------------------------- SuperBill Details Patient Name: Date of Service: [name] 09/24/2023 Medical Record Number: [id number] Patient Account Number: [id number] Date of Birth/Sex: Treating RN: [birth year] (89 y.o. [name], [name] Primary Care Provider: [name] Other Clinician: Referring Provider: Treating Provider/Extender: [name] in Treatment: [address] DAIZA, PRITZL ([id number]) [id number] Page 7 of 7 ICD-10 Codes Code Description [id number] Non-pressure chronic ulcer of other part of right lower leg with fat layer exposed L97.822 Non-pressure chronic ulcer of other part of left lower leg with fat layer exposed I89.0 Lymphedema, not elsewhere classified E11.622 Type 2 diabetes mellitus with other skin ulcer I50.42 Chronic combined systolic (congestive) and diastolic (congestive) heart failure Facility Procedures : CPT4 Code: [id number] Description: 84696 - DEBRIDE WOUND 1ST 20 SQ CM OR < ICD-10 Diagnosis Description L97.822 Non-pressure chronic ulcer of other part of left lower leg with fat layer expose Modifier: d Quantity: 1 Physician Procedures : CPT4 Code Description Modifier [id number] - WC PHYS DEBR WO ANESTH 20 SQ CM ICD-10 Diagnosis Description L97.822 Non-pressure chronic ulcer of other part of left lower leg with fat layer exposed Quantity: 1 Electronic Signature(s) Signed: 09/24/2023 4:45:38 PM By: Baltazar Najjar MD Entered By: Baltazar Najjar on 09/24/2023 11:51:54

## 2023-09-26 NOTE — Progress Notes (Signed)
[name], [name] ([id number]) [phone].pdf Page 1 of 7 Visit Report for 09/24/2023 Arrival Information Details Patient Name: Date of Service: [name], [name] 09/24/2023 11:15 A M Medical Record Number: [id number] Patient Account Number: [id number] Date of Birth/Sex: Treating RN: 01-04-1931 (89 y.o. F) Primary Care Darelle Kings: [name] Other Clinician: Referring Miroslava Santellan: Treating Chalyn Amescua/Extender: [name] in Treatment: 12 Visit Information History Since Last Visit Added or deleted any medications: No Patient Arrived: [name] Any new allergies or adverse reactions: No Arrival Time: 10:59 Had a fall or experienced change in No Accompanied By: friend activities of daily living that may affect Transfer Assistance: None risk of falls: Patient Identification Verified: Yes Signs or symptoms of abuse/neglect since last visito No Secondary Verification Process Completed: Yes Hospitalized since last visit: No Patient Requires Transmission-Based Precautions: No Implantable device outside of the clinic excluding No Patient Has Alerts: Yes cellular tissue based products placed in the center Patient Alerts: ABIs: R:0.92 L:0.91 8/24 since last visit: Has Dressing in Place as Prescribed: Yes Has Compression in Place as Prescribed: Yes Pain Present Now: No Electronic Signature(s) Signed: 09/26/2023 4:59:00 PM By: Thayer Dallas Entered By: Thayer Dallas on 09/24/2023 11:04:33 -------------------------------------------------------------------------------- Lower Extremity Assessment Details Patient Name: Date of Service: [name], [name] 09/24/2023 11:15 A M Medical Record Number: [id number] Patient Account Number: [id number] Date of Birth/Sex: Treating RN: 1930/12/29 (89 y.o. F) Primary Care Trine Fread: [name] Other Clinician: Referring Kenyatte Gruber: Treating Maralyn Witherell/Extender: [name] in Treatment: 12 Edema Assessment Assessed: [Left: No] [Right: No] Edema: [Left: Ye] [Right: s] Calf Left: Right: Point of Measurement: 32 cm From Medial Instep 41 cm Ankle Left: Right: Point of Measurement: 11 cm From Medial Instep 21.2 cm Vascular Assessment Extremity colors, hair growth, and conditions: Extremity Color: [Left:Normal] Hair Growth on Extremity: [Left:Yes] Anstey, Neko G ([id number]) [Right:[id number] Page 2 of 7] Temperature of Extremity: [Left:Hot] Capillary Refill: [Left:< 3 seconds] Dependent Rubor: [Left:No No] Electronic Signature(s) Signed: 09/26/2023 4:59:00 PM By: Thayer Dallas Entered By: Thayer Dallas on 09/24/2023 11:13:50 -------------------------------------------------------------------------------- Multi Wound Chart Details Patient Name: Date of Service: [name] 09/24/2023 11:15 A M Medical Record Number: [id number] Patient Account Number: [id number] Date of Birth/Sex: Treating RN: 02/21/1931 (89 y.o. F) Primary Care Durrel Mcnee: [name] Other Clinician: Referring Terrez Ander: Treating Elise Gladden/Extender: [name] in Treatment: 12 Vital Signs Height(in): 62 Pulse(bpm): 55 Weight(lbs): 220 Blood Pressure(mmHg): 138/58 Body Mass Index(BMI): 40.2 Temperature(F): 98 Respiratory Rate(breaths/min): 18 [12:Photos:] [N/A:N/A] Left, Distal, Anterior Lower Leg N/A N/A Wound Location: Blister N/A N/A Wounding Event: 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 Lymphedema N/A N/A Secondary Etiology: Anemia, Congestive Heart Failure, N/A N/A Comorbid History: Hypertension, Peripheral Venous Disease, Type II Diabetes, Osteoarthritis, Neuropathy 06/12/2023 N/A N/A Date Acquired: 12 N/A N/A Weeks of Treatment: Open N/A N/A Wound Status: No N/A N/A Wound Recurrence: 0.2x0.2x0.1 N/A N/A Measurements L x W x D  (cm) 0.031 N/A N/A A (cm) : rea 0.003 N/A N/A Volume (cm) : 99.20% N/A N/A % Reduction in A rea: 99.20% N/A N/A % Reduction in Volume: Grade 1 N/A N/A Classification: Medium N/A N/A Exudate A mount: Serosanguineous N/A N/A Exudate Type: red, brown N/A N/A Exudate Color: Distinct, outline attached N/A N/A Wound Margin: Large (67-100%) N/A N/A Granulation A mount: Red N/A N/A Granulation Quality: None Present (0%) N/A N/A Necrotic A mount: Fascia: No N/A N/A Exposed Structures: Fat Layer (Subcutaneous Tissue): No Tendon: No Muscle:  No Joint: No Bone: No Large (67-100%) N/A N/A Epithelialization: Debridement - Selective/Open Wound N/A N/A Debridement: [name], [name] ([id number]) [phone].pdf Page 3 of 7 11:32 N/A N/A Pre-procedure Verification/Time Out Taken: Lidocaine N/A N/A Pain Control: Slough N/A N/A Tissue Debrided: Non-Viable Tissue N/A N/A Level: 0.03 N/A N/A Debridement A (sq cm): rea Curette N/A N/A Instrument: Minimum N/A N/A Bleeding: Pressure N/A N/A Hemostasis A chieved: 0 N/A N/A Procedural Pain: 0 N/A N/A Post Procedural Pain: Procedure was tolerated well N/A N/A Debridement Treatment Response: 0.2x0.2x0.1 N/A N/A Post Debridement Measurements L x W x D (cm) 0.003 N/A N/A Post Debridement Volume: (cm) Scarring: Yes N/A N/A Periwound Skin Texture: Excoriation: No Induration: No Callus: No Crepitus: No Rash: No Maceration: No N/A N/A Periwound Skin Moisture: Dry/Scaly: No Hemosiderin Staining: Yes N/A N/A Periwound Skin Color: Atrophie Blanche: No Cyanosis: No Ecchymosis: No Erythema: No Mottled: No Pallor: No Rubor: No No Abnormality N/A N/A Temperature: Yes N/A N/A Tenderness on Palpation: Debridement N/A N/A Procedures Performed: Treatment Notes Electronic Signature(s) Signed: 09/24/2023 4:45:38 PM By: Baltazar Najjar MD Entered By: Baltazar Najjar on 09/24/2023  11:43:11 -------------------------------------------------------------------------------- Multi-Disciplinary Care Plan Details Patient Name: Date of Service: [name] 09/24/2023 11:15 A M Medical Record Number: [id number] Patient Account Number: [id number] Date of Birth/Sex: Treating RN: 25-Dec-1930 (89 y.o. [name], [name] Primary Care Fartun Paradiso: [name] Other Clinician: Referring Zorianna Taliaferro: Treating Abelina Ketron/Extender: [name] in Treatment: 12 Active Inactive Nutrition Nursing Diagnoses: Potential for alteratiion in Nutrition/Potential for imbalanced nutrition Goals: Patient/caregiver agrees to and verbalizes understanding of need to obtain nutritional consultation Date Initiated: 07/02/2023 Date Inactivated: 07/30/2023 Target Resolution Date: 08/02/2023 Goal Status: Met Patient/caregiver will maintain therapeutic glucose control Date Initiated: 07/02/2023 Target Resolution Date: 10/05/2023 Goal Status: Active Interventions: Assess HgA1c results as ordered upon admission and as needed Provide education on elevated blood sugars and impact on wound healing Provide education on nutrition [name], [name] ([id number]) [phone].pdf Page 4 of 7 Treatment Activities: Patient referred to Primary Care Physician for further nutritional evaluation : 07/02/2023 Notes: Pain, Acute or Chronic Nursing Diagnoses: Pain, acute or chronic: actual or potential Potential alteration in comfort, pain Goals: Patient will verbalize adequate pain control and receive pain control interventions during procedures as needed Date Initiated: 07/02/2023 Date Inactivated: 07/30/2023 Target Resolution Date: 08/02/2023 Goal Status: Met Patient/caregiver will verbalize comfort level met Date Initiated: 07/02/2023 Target Resolution Date: 10/05/2023 Goal Status: Active Interventions: Encourage patient to take pain medications as  prescribed Provide education on pain management Reposition patient for comfort Treatment Activities: Administer pain control measures as ordered : 07/02/2023 Notes: Wound/Skin Impairment Nursing Diagnoses: Knowledge deficit related to ulceration/compromised skin integrity Goals: Patient/caregiver will verbalize understanding of skin care regimen Date Initiated: 07/02/2023 Target Resolution Date: 10/01/2023 Goal Status: Active Interventions: Assess patient/caregiver ability to perform ulcer/skin care regimen upon admission and as needed Assess ulceration(s) every visit Provide education on ulcer and skin care Treatment Activities: Skin care regimen initiated : 07/02/2023 Topical wound management initiated : 07/02/2023 Notes: Electronic Signature(s) Signed: 09/24/2023 4:45:15 PM By: [name] RN Entered By: [name] on 09/24/2023 11:35:27 -------------------------------------------------------------------------------- Pain Assessment Details Patient Name: Date of Service: [name] 09/24/2023 11:15 A M Medical Record Number: [id number] Patient Account Number: [id number] Date of Birth/Sex: Treating RN: 1931/08/27 (89 y.o. F) Primary Care Lameshia Hypolite: [name] Other Clinician: Referring Pierra Skora: Treating Matteus Mcnelly/Extender: [name] in Treatment: 12 Active Problems Location of Pain Severity and Description of Pain  Patient Has [facility] No [address] [place], [place] G ([id number]) [id number] Page 5 of 7 Pain Management and Medication Current Pain Management: Electronic Signature(s) Signed: 09/26/2023 4:59:00 PM By: Thayer Dallas Entered By: Thayer Dallas on 09/24/2023 11:13:30 -------------------------------------------------------------------------------- Patient/Caregiver Education Details Patient Name: Date of Service: [name] 10/22/2024andnbsp11:15 A M Medical Record  Number: [id number] Patient Account Number: [id number] Date of Birth/Gender: Treating RN: 1931/08/22 (89 y.o. [name], [name] Primary Care Physician: [name] Other Clinician: Referring Physician: Treating Physician/Extender: [name] in Treatment: 12 Education Assessment Education Provided To: Patient Education Topics Provided Wound/Skin Impairment: Methods: Explain/Verbal Responses: Reinforcements needed, State content correctly [employer]) Signed: 09/24/2023 4:45:15 PM By: [name] RN Entered By: [name] on 09/24/2023 11:35:41 -------------------------------------------------------------------------------- Wound Assessment Details Patient Name: Date of Service: [name] 09/24/2023 11:15 A M Medical Record Number: [id number] Patient Account Number: [id number] Date of Birth/Sex: Treating RN: 18-Sep-1931 (89 y.o. F) Primary Care Niurka Benecke: [name] Other Clinician: SCARLETH, [name] ([id number]) [id number] Page 6 of 7 Referring Feliz Lincoln: Treating Sue Fernicola/Extender: [name] in Treatment: 12 Wound Status Wound Number: 12 Primary Diabetic Wound/Ulcer of the Lower Extremity Etiology: Wound Location: Left, Distal, Anterior Lower Leg Secondary Lymphedema Wounding Event: Blister Etiology: Date Acquired: 06/12/2023 Wound Open Weeks Of Treatment: 12 Status: Clustered Wound: No Comorbid Anemia, Congestive Heart Failure, Hypertension, Peripheral History: Venous Disease, Type II Diabetes, Osteoarthritis, Neuropathy Photos Wound Measurements Length: (cm) 0.2 Width: (cm) 0.2 Depth: (cm) 0.1 Area: (cm) 0.031 Volume: (cm) 0.003 % Reduction in Area: 99.2% % Reduction in Volume: 99.2% Epithelialization: Large (67-100%) Tunneling: No Undermining: No Wound Description Classification: Grade 1 Wound Margin: Distinct, outline  attached Exudate Amount: Medium Exudate Type: Serosanguineous Exudate Color: red, brown Foul Odor After Cleansing: No Slough/Fibrino Yes Wound Bed Granulation Amount: Large (67-100%) Exposed Structure Granulation Quality: Red Fascia Exposed: No Necrotic Amount: None Present (0%) Fat Layer (Subcutaneous Tissue) Exposed: No Tendon Exposed: No Muscle Exposed: No Joint Exposed: No Bone Exposed: No Periwound Skin Texture Texture Color No Abnormalities Noted: No No Abnormalities Noted: No Callus: No Atrophie Blanche: No Crepitus: No Cyanosis: No Excoriation: No Ecchymosis: No Induration: No Erythema: No Rash: No Hemosiderin Staining: Yes Scarring: Yes Mottled: No Pallor: No Moisture Rubor: No No Abnormalities Noted: No Dry / Scaly: No Temperature / Pain Maceration: No Temperature: No Abnormality Tenderness on Palpation: Yes Electronic Signature(s) Signed: 09/26/2023 4:59:00 PM By: Thayer Dallas Entered By: Thayer Dallas on 09/24/2023 11:16:37 [name] ([id number]) [id number] Page 7 of 7 -------------------------------------------------------------------------------- Vitals Details Patient Name: Date of Service: [name], [name] 09/24/2023 11:15 A M Medical Record Number: [id number] Patient Account Number: [id number] Date of Birth/Sex: Treating RN: 30-Jan-1931 (89 y.o. F) Primary Care Wilho Sharpley: [name] Other Clinician: Referring Magali Bray: Treating Jerica Creegan/Extender: [name] in Treatment: 12 Vital Signs Time Taken: 11:04 Temperature (F): 98 Height (in): 62 Pulse (bpm): 55 Weight (lbs): 220 Respiratory Rate (breaths/min): 18 Body Mass Index (BMI): 40.2 Blood Pressure (mmHg): 138/58 Reference Range: 80 - 120 mg / dl Electronic Signature(s) Signed: 09/26/2023 4:59:00 PM By: Thayer Dallas Entered By: Thayer Dallas on 09/24/2023 11:05:04

## 2023-10-01 ENCOUNTER — Encounter (HOSPITAL_BASED_OUTPATIENT_CLINIC_OR_DEPARTMENT_OTHER): Payer: Medicare PPO | Admitting: Internal Medicine

## 2023-10-01 DIAGNOSIS — E11622 Type 2 diabetes mellitus with other skin ulcer: Secondary | ICD-10-CM | POA: Diagnosis not present

## 2023-10-01 NOTE — Progress Notes (Signed)
[name] ([id number]) [phone].pdf Page 1 of 9 Visit Report for 10/01/2023 Arrival Information Details Patient Name: Date of Service: [name], [name] 10/01/2023 11:15 A M Medical Record Number: [id number] Patient Account Number: [id number] Date of Birth/Sex: Treating RN: 23-Jul-1931 (89 y.o. F) Primary Care Michalene Debruler: [name] Other Clinician: Referring Paiden Caraveo: Treating Oreatha Fabry/Extender: [name] in Treatment: 13 Visit Information History Since Last Visit Added or deleted any medications: No Patient Arrived: [name] Any new allergies or adverse reactions: No Arrival Time: 11:34 Had a fall or experienced change in No Accompanied By: family activities of daily living that may affect Transfer Assistance: None risk of falls: Patient Identification Verified: Yes Signs or symptoms of abuse/neglect since last visito No Secondary Verification Process Completed: Yes Hospitalized since last visit: No Patient Requires Transmission-Based Precautions: No Implantable device outside of the clinic excluding No Patient Has Alerts: Yes cellular tissue based products placed in the center Patient Alerts: ABIs: R:0.92 L:0.91 8/24 since last visit: Pain Present Now: Yes Electronic Signature(s) Signed: 10/01/2023 11:47:55 AM By: Dayton Scrape Entered By: Dayton Scrape on 10/01/2023 08:35:18 -------------------------------------------------------------------------------- Encounter Discharge Information Details Patient Name: Date of Service: [name] 10/01/2023 11:15 A M Medical Record Number: [id number] Patient Account Number: [id number] Date of Birth/Sex: Treating RN: 04-27-1931 (89 y.o. [name], [name] Primary Care Trey Bebee: [name] Other Clinician: Referring Eily Louvier: Treating Hussein Macdougal/Extender: [name] in Treatment: 13 Encounter Discharge Information Items  Post Procedure Vitals Discharge Condition: Stable Temperature (F): 98.7 Ambulatory Status: Walker Pulse (bpm): 74 Discharge Destination: Home Respiratory Rate (breaths/min): 17 Transportation: Private Auto Blood Pressure (mmHg): 134/74 Accompanied By: daughter Schedule Follow-up Appointment: Yes Clinical Summary of Care: Patient Declined Electronic Signature(s) Unsigned Entered By: [name] on 10/01/2023 08:55:22 Signature(s): MELIYAH, TOMPKINS ([id number]) [phone] Date(s): [phone].pdf Page 2 of 9 -------------------------------------------------------------------------------- Lower Extremity Assessment Details Patient Name: Date of Service: [name] 10/01/2023 11:15 A M Medical Record Number: [id number] Patient Account Number: [id number] Date of Birth/Sex: Treating RN: Mar 30, 1931 (89 y.o. [name], [name] Primary Care Trevar Boehringer: [name] Other Clinician: Referring Oline Belk: Treating Asar Evilsizer/Extender: [name] in Treatment: 13 Edema Assessment Assessed: Kyra Searles: Yes] Franne Forts: No] Edema: [Left: Ye] [Right: s] Calf Left: Right: Point of Measurement: 32 cm From Medial Instep 41 cm Ankle Left: Right: Point of Measurement: 11 cm From Medial Instep 21.2 cm Vascular Assessment Extremity colors, hair growth, and conditions: Extremity Color: [Left:Normal] Hair Growth on Extremity: [Left:Yes] Temperature of Extremity: [Left:Hot] Capillary Refill: [Left:< 3 seconds] Dependent Rubor: [Left:No No] Toe Nail Assessment Left: Right: Thick: Yes Discolored: Yes Deformed: Yes Improper Length and Hygiene: Yes Electronic Signature(s) Unsigned Entered By: [name] on 10/01/2023 08:49:25 -------------------------------------------------------------------------------- Multi Wound Chart Details Patient Name: Date of Service: [name] 10/01/2023 11:15 A M Medical Record Number:  [id number] Patient Account Number: [id number] Date of Birth/Sex: Treating RN: 1931-10-04 (89 y.o. 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 Lymphedema N/A N/A Secondary Etiology: Anemia, Congestive Heart Failure, N/A N/A Comorbid History: Hypertension, Peripheral Venous Disease, Type II Diabetes, Osteoarthritis,  Neuropathy 06/12/2023 N/A N/A Date Acquired: 68 N/A N/A Weeks of Treatment: Open N/A N/A Wound Status: No N/A N/A Wound Recurrence: 0.2x0.2x0.1 N/A N/A Measurements L x W x D (cm) 0.031 N/A N/A A (cm) : rea 0.003 N/A N/A Volume (cm) : 99.20% N/A N/A % Reduction in A rea: 99.20% N/A N/A % Reduction in Volume: Grade 1 N/A N/A Classification: Medium N/A N/A Exudate A mount: Serosanguineous N/A N/A Exudate Type: red, brown N/A N/A Exudate Color: Distinct, outline attached N/A N/A Wound Margin: Large (67-100%) N/A N/A Granulation A mount: Red N/A N/A Granulation Quality: None Present (0%) N/A N/A Necrotic A mount: Fascia: No N/A N/A Exposed Structures: Fat Layer (Subcutaneous Tissue): No Tendon: No Muscle: No Joint: No Bone: No Large (67-100%) N/A N/A Epithelialization: Debridement - Excisional N/A N/A Debridement: Pre-procedure Verification/Time Out 11:51 N/A N/A Taken: Lidocaine N/A N/A Pain Control: Subcutaneous, Slough N/A N/A Tissue Debrided: Skin/Subcutaneous Tissue N/A  N/A Level: 0.03 N/A N/A Debridement A (sq cm): rea Curette N/A N/A Instrument: Minimum N/A N/A Bleeding: Pressure N/A N/A Hemostasis A chieved: 0 N/A N/A Procedural Pain: 0 N/A N/A Post Procedural Pain: Procedure was tolerated well N/A N/A Debridement Treatment Response: 0.2x0.2x0.1 N/A N/A Post Debridement Measurements L x W x D (cm) 0.003 N/A N/A Post Debridement Volume: (cm) Scarring: Yes N/A N/A Periwound Skin Texture: Excoriation: No Induration: No Callus: No Crepitus: No Rash: No Maceration: No N/A N/A Periwound Skin Moisture: Dry/Scaly: No Hemosiderin Staining: Yes N/A N/A Periwound Skin Color: Atrophie Blanche: No Cyanosis: No Ecchymosis: No Erythema: No Mottled: No Pallor: No Rubor: No No Abnormality N/A N/A Temperature: Yes N/A N/A Tenderness on Palpation: Debridement N/A N/A Procedures Performed: Treatment Notes GEISHA, BOGENSCHUTZ ([id number]) [id number].pdf Page 4 of 9 Wound #12 (Lower Leg) Wound Laterality: Left, Anterior, Distal Cleanser Soap and Water Discharge Instruction: May shower and wash wound with dial antibacterial soap and water prior to dressing change. Vashe 5.8 (oz) Discharge Instruction: Cleanse the wound with Vashe prior to applying a clean dressing using gauze sponges, not tissue or cotton balls. Peri-Wound Care Sween Lotion (Moisturizing lotion) Discharge Instruction: Apply moisturizing lotion as directed Topical Primary Dressing IODOFLEX 0.9% Cadexomer Iodine Pad 4x6 cm Discharge Instruction: Apply to wound bed as instructed Secondary Dressing ABD Pad, 8x10 Discharge Instruction: Apply over primary dressing as directed. Woven Gauze Sponge, Non-Sterile 4x4 in Discharge Instruction: Apply over primary dressing as directed. Secured With Compression Wrap Kerlix Roll 4.5x3.1 (in/yd) Discharge Instruction: Apply Kerlix and Coban compression as directed. Coban Self-Adherent Wrap 4x5 (in/yd) Discharge  Instruction: Apply over Kerlix as directed. Compression Stockings Add-Ons Electronic Signature(s) Unsigned Entered By: Baltazar Najjar on 10/01/2023 09:16:51 -------------------------------------------------------------------------------- Multi-Disciplinary Care Plan Details Patient Name: Date of Service: ICLE, MCCLARD 10/01/2023 11:15 A M Medical Record Number: [id number] Patient Account Number: [id number] Date of Birth/Sex: Treating RN: 1931/11/12 (89 y.o. [name], [name] Primary Care Amay Mijangos: [name] Other Clinician: Referring Michille Mcelrath: Treating Jerzi Tigert/Extender: [name] in Treatment: 13 Active Inactive Nutrition Nursing Diagnoses: Potential for alteratiion in Nutrition/Potential for imbalanced nutrition Goals: Patient/caregiver agrees to and verbalizes understanding of need to obtain nutritional consultation Date Initiated: 07/02/2023 Date Inactivated: 07/30/2023 Target Resolution Date: 08/02/2023 Goal Status: Met Patient/caregiver will maintain therapeutic glucose control PRISHA, SALVATI ([id number]) [phone].pdf Page 5 of 9 Date Initiated: 07/02/2023 Target Resolution Date: 10/05/2023 Goal Status: Active Interventions: Assess HgA1c results as ordered upon admission and as needed Provide education on elevated blood sugars and impact on wound healing Provide education  on nutrition Treatment Activities: Patient referred to Primary Care Physician for further nutritional evaluation : 07/02/2023 Notes: Pain, Acute or Chronic Nursing Diagnoses: Pain, acute or chronic: actual or potential Potential alteration in comfort, pain Goals: Patient will verbalize adequate pain control and receive pain control interventions during procedures as needed Date Initiated: 07/02/2023 Date Inactivated: 07/30/2023 Target Resolution Date: 08/02/2023 Goal Status: Met Patient/caregiver will verbalize comfort level  met Date Initiated: 07/02/2023 Target Resolution Date: 10/05/2023 Goal Status: Active Interventions: Encourage patient to take pain medications as prescribed Provide education on pain management Reposition patient for comfort Treatment Activities: Administer pain control measures as ordered : 07/02/2023 Notes: Wound/Skin Impairment Nursing Diagnoses: Knowledge deficit related to ulceration/compromised skin integrity Goals: Patient/caregiver will verbalize understanding of skin care regimen Date Initiated: 07/02/2023 Target Resolution Date: 10/01/2023 Goal Status: Active Interventions: Assess patient/caregiver ability to perform ulcer/skin care regimen upon admission and as needed Assess ulceration(s) every visit Provide education on ulcer and skin care Treatment Activities: Skin care regimen initiated : 07/02/2023 Topical wound management initiated : 07/02/2023 Notes: Electronic Signature(s) Unsigned Entered By: [name] on 10/01/2023 08:54:21 -------------------------------------------------------------------------------- Pain Assessment Details Patient Name: Date of Service: LILIE, DEKKER 10/01/2023 11:15 A M Medical Record Number: [id number] Patient Account Number: [id number] Date of Birth/Sex: Treating RN: Dec 12, 1930 (89 y.o. Mairely Mondi, Keimora G ([id number]) [id number] Page 6 of 9 Primary Care Jessyka Austria: [name] Other Clinician: Referring Laurann Mcmorris: Treating Daviyon Widmayer/Extender: [name] in Treatment: 13 Active Problems Location of Pain Severity and Description of Pain Patient Has Paino Yes Site Locations Rate the pain. Current Pain Level: 5 Worst Pain Level: 10 Least Pain Level: 0 Tolerable Pain Level: 2 Pain Management and Medication Current Pain Management: Electronic Signature(s) Signed: 10/01/2023 11:47:55 AM By: Dayton Scrape Entered By: Dayton Scrape on 10/01/2023  08:36:38 -------------------------------------------------------------------------------- Patient/Caregiver Education Details Patient Name: Date of Service: [name] 10/29/2024andnbsp11:15 A M Medical Record Number: [id number] Patient Account Number: [id number] Date of Birth/Gender: Treating RN: 05/15/1931 (89 y.o. Toniann Fail Primary Care Physician: [name] Other Clinician: Referring Physician: Treating Physician/Extender: [name] in Treatment: 13 Education Assessment Education Provided To: Patient Education Topics Provided Wound/Skin Impairment: Methods: Explain/Verbal Responses: Reinforcements needed, State content correctly Electronic Signature(s) Unsigned Entered By: [name] on 10/01/2023 08:54:36 Signature(s): KESHAUN, NEWNHAM ([id number]) [id number] Date(s): [id number].pdf Page 7 of 9 -------------------------------------------------------------------------------- Wound Assessment Details Patient Name: Date of Service: AKERA, BENA 10/01/2023 11:15 A M Medical Record Number: [id number] Patient Account Number: [id number] Date of Birth/Sex: Treating RN: 1931-06-10 (89 y.o. F) Primary Care Nandi Tonnesen: [name] Other Clinician: Referring Ronita Hargreaves: Treating Osher Oettinger/Extender: [name] in Treatment: 13 Wound Status Wound Number: 12 Primary Diabetic Wound/Ulcer of the Lower Extremity Etiology: Wound Location: Left, Distal, Anterior Lower Leg Secondary Lymphedema Wounding Event: Blister Etiology: Date Acquired: 06/12/2023 Wound Open Weeks Of Treatment: 13 Status: Clustered Wound: No Comorbid Anemia, Congestive Heart Failure, Hypertension, Peripheral History: Venous Disease, Type II Diabetes, Osteoarthritis, Neuropathy Photos Wound Measurements Length: (cm) 0.2 Width: (cm) 0.2 Depth: (cm) 0.1 Area: (cm) 0.031 Volume: (cm) 0.003 %  Reduction in Area: 99.2% % Reduction in Volume: 99.2% Epithelialization: Large (67-100%) Wound Description Classification: Grade 1 Wound Margin: Distinct, outline attached Exudate Amount: Medium Exudate Type: Serosanguineous Exudate Color: red, brown Foul Odor After Cleansing: No Slough/Fibrino Yes Wound Bed Granulation Amount: Large (67-100%) Exposed Structure Granulation Quality: Red Fascia Exposed: No Necrotic Amount: None Present (0%) Fat Layer (Subcutaneous Tissue) Exposed: No  Tendon Exposed: No Muscle Exposed: No Joint Exposed: No Bone Exposed: No Periwound Skin Texture Texture Color No Abnormalities Noted: No No Abnormalities Noted: No Callus: No Atrophie Blanche: No Crepitus: No Cyanosis: No Excoriation: No Ecchymosis: No Induration: No Erythema: No Rash: No Hemosiderin Staining: Yes Scarring: Yes Mottled: No Pallor: No Moisture Rubor: No No Abnormalities Noted: No Dry / Scaly: No Temperature / Pain Laguardia, Taiylor G ([id number]) [id number].pdf Page 8 of 9 Maceration: No Temperature: No Abnormality Tenderness on Palpation: Yes Treatment Notes Wound #12 (Lower Leg) Wound Laterality: Left, Anterior, Distal Cleanser Soap and Water Discharge Instruction: May shower and wash wound with dial antibacterial soap and water prior to dressing change. Vashe 5.8 (oz) Discharge Instruction: Cleanse the wound with Vashe prior to applying a clean dressing using gauze sponges, not tissue or cotton balls. Peri-Wound Care Sween Lotion (Moisturizing lotion) Discharge Instruction: Apply moisturizing lotion as directed Topical Primary Dressing IODOFLEX 0.9% Cadexomer Iodine Pad 4x6 cm Discharge Instruction: Apply to wound bed as instructed Secondary Dressing ABD Pad, 8x10 Discharge Instruction: Apply over primary dressing as directed. Woven Gauze Sponge, Non-Sterile 4x4 in Discharge Instruction: Apply over primary dressing as directed. Secured  With Compression Wrap Kerlix Roll 4.5x3.1 (in/yd) Discharge Instruction: Apply Kerlix and Coban compression as directed. Coban Self-Adherent Wrap 4x5 (in/yd) Discharge Instruction: Apply over Kerlix as directed. Compression Stockings Add-Ons Electronic Signature(s) Signed: 10/01/2023 11:47:55 AM By: Dayton Scrape Entered By: Dayton Scrape on 10/01/2023 08:47:33 -------------------------------------------------------------------------------- Vitals Details Patient Name: Date of Service: [name] 10/01/2023 11:15 A M Medical Record Number: [id number] Patient Account Number: [id number] Date of Birth/Sex: Treating RN: 1930-12-17 (89 y.o. F) Primary Care Yesena Reaves: [name] Other Clinician: Referring Keyonda Bickle: Treating Kellee Sittner/Extender: [name] in Treatment: 13 Vital Signs Time Taken: 11:35 Temperature (F): 97.9 Height (in): 62 Pulse (bpm): 69 Weight (lbs): 220 Respiratory Rate (breaths/min): 18 Body Mass Index (BMI): 40.2 Blood Pressure (mmHg): 135/70 Reference Range: 80 - 120 mg / dl Electronic Signature(s) Signed: 10/01/2023 11:47:55 AM By: Dayton Scrape Entered By: Dayton Scrape on 10/01/2023 08:36:06 Druck, [name] ([id number]) [id number] Page 9 of 9

## 2023-10-02 NOTE — Progress Notes (Signed)
DEBY, PERRIER ([id number]) [id number] Page 1 of 7 Visit Report for 10/01/2023 Debridement Details Patient Name: Date of Service: [name], [name] 10/01/2023 11:15 A M Medical Record Number: [id number] Patient Account Number: [id number] Date of Birth/Sex: Treating RN: 10-27-1931 (89 y.o. F) Primary Care Provider: [name] Other Clinician: Referring Provider: Treating Provider/Extender: [name] in Treatment: 13 Debridement Performed for Assessment: Wound #12 Left,Distal,Anterior Lower Leg Performed By: Physician Maxwell Caul., MD The following information was scribed by: [name] The information was scribed for: Baltazar Najjar Debridement Type: Debridement Severity of Tissue Pre Debridement: Fat layer exposed Level of Consciousness (Pre-procedure): Awake and Alert Pre-procedure Verification/Time Out Yes - 11:51 Taken: Start Time: 11:51 Pain Control: Lidocaine Percent of Wound Bed Debrided: 100% T Area Debrided (cm): otal 0.03 Tissue and other material debrided: Viable, Non-Viable, Slough, Subcutaneous, Slough Level: Skin/Subcutaneous Tissue Debridement Description: Excisional Instrument: Curette Bleeding: Minimum Hemostasis Achieved: Pressure End Time: 11:51 Procedural Pain: 0 Post Procedural Pain: 0 Response to Treatment: Procedure was tolerated well Level of Consciousness (Post- Awake and Alert procedure): Post Debridement Measurements of Total Wound Length: (cm) 0.2 Width: (cm) 0.2 Depth: (cm) 0.1 Volume: (cm) 0.003 Character of Wound/Ulcer Post Debridement: Improved Severity of Tissue Post Debridement: Fat layer exposed Post Procedure Diagnosis Same as Pre-procedure Electronic Signature(s) Signed: 10/01/2023 5:13:18 PM By: Baltazar Najjar MD Entered By: Baltazar Najjar on 10/01/2023  12:17:00 -------------------------------------------------------------------------------- HPI Details Patient Name: Date of Service: [name] 10/01/2023 11:15 A M Medical Record Number: [id number] Patient Account Number: [id number] Date of Birth/Sex: Treating RN: 05-04-1931 (89 y.o. 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 She is not quite sure how the right upper leg wound started. The left lower extremity wound started out as blisters. She has compression stockings but has not been wearing them. She takes Lasix 20 mg daily. She currently denies signs of infection. In office ABIs were 0.57 on the left however dorsalis pedal pulses and posterior tibialis pulses were heard on Doppler. 8/6; patient presents for follow-up. We have been using antibiotic ointment with Hydrofera Blue under Kerlix/Coban to the left lower extremity. Hydrofera Blue to the right lower leg. She came in for nurse visit and wrap change. She reports no issues. She reports that she is scheduled for her ABIs next week. 8/13; patient presents for follow-up. We have been using Hydrofera Blue and Santyl to the left lower extremity under Kerlix/Coban. She has been using Hydrofera Blue to the right upper leg wound. She had ABIs completed yesterday that showed an ABI of 0.91 on the left and 0.92 on the right. She has no issues or complaints today. 8/20; patient presents for  follow-up. We have been using Hydrofera Blue and Santyl to the left lower extremity under 3 layer compression and Hydrofera Blue to the right upper leg wound. The right upper leg wound has healed. She feels that the wrap was too tight with the 3 layer. We will go back down to Kerlix/Coban. Other than that she has no issues or complaints. She denies signs of infection.  Wounds Appear smaller. 8/27; patient presents for follow-up. We have been using Hydrofera Blue and Santyl to the left lower extremity wounds under Kerlix/Coban. She tolerated this well. She has no issues or complaints today. Wounds are smaller. 9/3; patient presents for follow-up. We have been using Hydrofera Blue and Santyl to the left lower extremity wound under Kerlix/Coban. Wound is smaller. 9/10; patient presents for follow-up. We have been using Hydrofera Blue and Santyl to the lower extremity under Kerlix/Coban. Wound appears smaller and well-healing. 9/17 continued improvement we have been using Santyl and Hydrofera Blue under kerlix Coban to the left leg wound largely secondary to chronic lymphedema 9/24; continued improvement in the wound size on the left anterior mid tibia. This is in the setting of significant left greater than right lower leg lymphedema. Initially the patient came in with wounds on both legs however the right leg is healed. The wound on the left leg is smaller. We will go ahead and order bilateral juxta lite stockings today 10/1; still a small open wound on the left anterior lower leg in the setting of chronic venous insufficiency and lymphedema. We have been using Santyl and Hydrofera Blue under Kerlix Coban. Everything is closed on the right leg and she has her own juxta lite which she will just the 30/40 mm compression. 10/15; still a small punched-out area on the left anterior lower leg in the setting of chronic venous insufficiency and lymphedema. I changed her to Iodoflex last week. She is using juxta  lites to the right leg and so far things seem stable here. 10/22; same small punched-out area in the left anterior mid tibia. Setting of chronic venous insufficiency and lymphedema. I changed her to Iodoflex flex last week using juxta lites on the right leg and we are wrapping the left leg. Everything else is healed 10/29; small punched-out area on the left anterior mid tibia once again a nonviable surface. I used Iodoflex for the last 3 weeks. Prior to that [facility]. We still have trouble with the viability of the surface Electronic Signature(s) Signed: 10/01/2023 5:13:18 PM By: Baltazar Najjar MD Entered By: Baltazar Najjar on 10/01/2023 12:17:45 -------------------------------------------------------------------------------- Physical Exam Details Patient Name: Date of Service: [name] 10/01/2023 11:15 A M Medical Record Number: [id number] Patient Account Number: [id number] Date of Birth/Sex: Treating RN: 1931/11/26 (89 y.o. F) Primary Care Provider: [name] Other Clinician: Referring Provider: Treating Provider/Extender: [name] in Treatment: 13 Constitutional Sitting or standing Blood Pressure is within target range for patient.. Pulse regular and within target range for patient.Marland Kitchen Respirations regular, non-labored and within target range.. Temperature is normal and within the target range for the patient.Marland Kitchen Appears in no distress. Notes Wound exam; the area on the left anterior mid tibia. Used a #3 curette to clean out the wound to what looks like a viable surface however this is the same thing every week. Minimal bleeding no surrounding infection. Edema control is adequate Electronic Signature(s) Signed: 10/01/2023 5:13:18 PM By: Baltazar Najjar MD Entered By: Baltazar Najjar on 10/01/2023 12:18:27 [name] ([id number]) [id number] Page 3 of  7 -------------------------------------------------------------------------------- Physician Orders Details Patient Name: Date of Service: [name], [name] 10/01/2023 11:15 A M Medical Record Number: [id number] Patient Account Number: [id number] Date of Birth/Sex: Treating RN: 01/13/1931 (89 y.o. Toniann Fail Primary Care Provider: [name] Other Clinician: Referring Provider: Treating Provider/Extender: [name] in Treatment: [phone] Verbal / Phone Orders: No Diagnosis Coding Follow-up  Appointments ppointment in 1 week. - Tuesday w/ Dr. Leanord Hawking Return A Other: - [employer] - call about your compression stockings. Anesthetic (In clinic) Topical Lidocaine 4% applied to wound bed Bathing/ Shower/ Hygiene May shower with protection but do not get wound dressing(s) wet. Protect dressing(s) with water repellant cover (for example, large plastic bag) or a cast cover and may then take shower. Additional Orders / Instructions Follow Nutritious Diet - increase protein Juven Shake 1-2 times daily. Wound Treatment Wound #12 - Lower Leg Wound Laterality: Left, Anterior, Distal Cleanser: Soap and Water 1 x Per Week/30 Days Discharge Instructions: May shower and wash wound with dial antibacterial soap and water prior to dressing change. Cleanser: Vashe 5.8 (oz) 1 x Per Week/30 Days Discharge Instructions: Cleanse the wound with Vashe prior to applying a clean dressing using gauze sponges, not tissue or cotton balls. Peri-Wound Care: Sween Lotion (Moisturizing lotion) 1 x Per Week/30 Days Discharge Instructions: Apply moisturizing lotion as directed Prim Dressing: IODOFLEX 0.9% Cadexomer Iodine Pad 4x6 cm 1 x Per Week/30 Days ary Discharge Instructions: Apply to wound bed as instructed Secondary Dressing: ABD Pad, 8x10 1 x Per Week/30 Days Discharge Instructions: Apply over primary dressing as directed. Secondary Dressing: Woven Gauze  Sponge, Non-Sterile 4x4 in 1 x Per Week/30 Days Discharge Instructions: Apply over primary dressing as directed. Compression Wrap: Kerlix Roll 4.5x3.1 (in/yd) 1 x Per Week/30 Days Discharge Instructions: Apply Kerlix and Coban compression as directed. Compression Wrap: Coban Self-Adherent Wrap 4x5 (in/yd) 1 x Per Week/30 Days Discharge Instructions: Apply over Kerlix as directed. Electronic Signature(s) Signed: 10/01/2023 4:23:12 PM By: [name] RN Signed: 10/01/2023 5:13:18 PM By: Baltazar Najjar MD Entered By: [name] on 10/01/2023 11:54:05 [name] ([id number]) [id number] Page 4 of 7 -------------------------------------------------------------------------------- Problem List Details Patient Name: Date of Service: [name], [name] 10/01/2023 11:15 A M Medical Record Number: [id number] Patient Account Number: [id number] Date of Birth/Sex: Treating RN: [birth year] (89 y.o. F) Primary Care Provider: [name] Other Clinician: Referring Provider: Treating Provider/Extender: [name] in Treatment: 13 Active Problems ICD-10 Encounter Code Description Active Date MDM Diagnosis [id number] Non-pressure chronic ulcer of other part of left lower leg with fat layer exposed7/30/2024 No Yes I89.0 Lymphedema, not elsewhere classified 07/02/2023 No Yes E11.622 Type 2 diabetes mellitus with other skin ulcer 07/02/2023 No Yes I50.42 Chronic combined systolic (congestive) and diastolic (congestive) heart failure 07/02/2023 No Yes Inactive Problems ICD-10 Code Description Active Date Inactive Date L97.812 Non-pressure chronic ulcer of other part of right lower leg with fat layer exposed 07/02/2023 07/02/2023 Resolved Problems Electronic Signature(s) Signed: 10/01/2023 5:13:18 PM By: Baltazar Najjar MD Entered By: Baltazar Najjar on 10/01/2023  12:16:43 -------------------------------------------------------------------------------- Progress Note Details Patient Name: Date of Service: [name] 10/01/2023 11:15 A M Medical Record Number: [id number] Patient Account Number: [id number] Date of Birth/Sex: Treating RN: 1931/02/07 (89 y.o. F) Primary Care Provider: [name] Other Clinician: Referring Provider: Treating Provider/Extender: [name] in Treatment: 13 Subjective History of Present Illness (HPI) 07/02/2023 Ms. 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 She is not quite sure how the right upper leg wound started. The left lower extremity wound started out as blisters. She has compression stockings but has not been wearing them. She  takes Lasix 20 mg daily. She currently denies signs of infection. In office ABIs were 0.57 on the left however dorsalis pedal pulses and posterior tibialis pulses were heard on Doppler. 8/6; patient presents for follow-up. We have been using antibiotic ointment with Hydrofera Blue under Kerlix/Coban to the left lower extremity. Hydrofera Blue to the right lower leg. She came in for nurse visit and wrap change. She reports no issues. She reports that she is scheduled for her ABIs next week. [name], [name] ([id number]) [id number] Page 5 of 7 8/13; patient presents for follow-up. We have been using Hydrofera Blue and Santyl to the left lower extremity under Kerlix/Coban. She has been using Hydrofera Blue to the right upper leg wound. She had ABIs completed yesterday that showed an ABI of 0.91 on the left and 0.92 on the right. She has no issues or complaints today. 8/20; patient presents  for follow-up. We have been using Hydrofera Blue and Santyl to the left lower extremity under 3 layer compression and Hydrofera Blue to the right upper leg wound. The right upper leg wound has healed. She feels that the wrap was too tight with the 3 layer. We will go back down to Kerlix/Coban. Other than that she has no issues or complaints. She denies signs of infection. Wounds Appear smaller. 8/27; patient presents for follow-up. We have been using Hydrofera Blue and Santyl to the left lower extremity wounds under Kerlix/Coban. She tolerated this well. She has no issues or complaints today. Wounds are smaller. 9/3; patient presents for follow-up. We have been using Hydrofera Blue and Santyl to the left lower extremity wound under Kerlix/Coban. Wound is smaller. 9/10; patient presents for follow-up. We have been using Hydrofera Blue and Santyl to the lower extremity under Kerlix/Coban. Wound appears smaller and well-healing. 9/17 continued improvement we have been using Santyl and Hydrofera Blue under kerlix Coban to the left leg wound largely secondary to chronic lymphedema 9/24; continued improvement in the wound size on the left anterior mid tibia. This is in the setting of significant left greater than right lower leg lymphedema. Initially the patient came in with wounds on both legs however the right leg is healed. The wound on the left leg is smaller. We will go ahead and order bilateral juxta lite stockings today 10/1; still a small open wound on the left anterior lower leg in the setting of chronic venous insufficiency and lymphedema. We have been using Santyl and Hydrofera Blue under Kerlix Coban. Everything is closed on the right leg and she has her own juxta lite which she will just the 30/40 mm compression. 10/15; still a small punched-out area on the left anterior lower leg in the setting of chronic venous insufficiency and lymphedema. I changed her to Iodoflex last week. She is using  juxta lites to the right leg and so far things seem stable here. 10/22; same small punched-out area in the left anterior mid tibia. Setting of chronic venous insufficiency and lymphedema. I changed her to Iodoflex flex last week using juxta lites on the right leg and we are wrapping the left leg. Everything else is healed 10/29; small punched-out area on the left anterior mid tibia once again a nonviable surface. I used Iodoflex for the last 3 weeks. Prior to that [facility]. We still have trouble with the viability of the surface Objective Constitutional Sitting or standing Blood Pressure is within target range for patient.. Pulse regular and within target range for patient.Marland Kitchen Respirations regular, non-labored and within  target range.. Temperature is normal and within the target range for the patient.Marland Kitchen Appears in no distress. Vitals Time Taken: 11:35 AM, Height: 62 in, Weight: 220 lbs, BMI: 40.2, Temperature: 97.9 F, Pulse: 69 bpm, Respiratory Rate: 18 breaths/min, Blood Pressure: 135/70 mmHg. General Notes: Wound exam; the area on the left anterior mid tibia. Used a #3 curette to clean out the wound to what looks like a viable surface however this is the same thing every week. Minimal bleeding no surrounding infection. Edema control is adequate Integumentary (Hair, Skin) Wound #12 status is Open. Original cause of wound was Blister. The date acquired was: 06/12/2023. The wound has been in treatment 13 weeks. The wound is located on the [facility] Lower Leg. The wound measures 0.2cm length x 0.2cm width x 0.1cm depth; 0.031cm^2 area and 0.003cm^3 volume. There is a medium amount of serosanguineous drainage noted. The wound margin is distinct with the outline attached to the wound base. There is large (67-100%) red granulation within the wound bed. There is no necrotic tissue within the wound bed. The periwound skin appearance exhibited: Scarring, Hemosiderin Staining. The periwound skin  appearance did not exhibit: Callus, Crepitus, Excoriation, Induration, Rash, Dry/Scaly, Maceration, Atrophie Blanche, Cyanosis, Ecchymosis, Mottled, Pallor, Rubor, Erythema. Periwound temperature was noted as No Abnormality. The periwound has tenderness on palpation. Assessment Active Problems ICD-10 Non-pressure chronic ulcer of other part of left lower leg with fat layer exposed Lymphedema, not elsewhere classified Type 2 diabetes mellitus with other skin ulcer Chronic combined systolic (congestive) and diastolic (congestive) heart failure Procedures Wound #12 Pre-procedure diagnosis of Wound #12 is a Diabetic Wound/Ulcer of the Lower Extremity located on the Left,Distal,Anterior Lower Leg .Severity of Tissue Pre Debridement is: Fat layer exposed. There was a Excisional Skin/Subcutaneous Tissue Debridement with a total area of 0.03 sq cm performed by Maxwell Caul., MD. With the following instrument(s): Curette to remove Viable and Non-Viable tissue/material. Material removed includes Subcutaneous Tissue and [name], [name] ([id number]) [id number] Page 6 of 7 Slough and after achieving pain control using Lidocaine. No specimens were taken. A time out was conducted at 11:51, prior to the start of the procedure. A Minimum amount of bleeding was controlled with Pressure. The procedure was tolerated well with a pain level of 0 throughout and a pain level of 0 following the procedure. Post Debridement Measurements: 0.2cm length x 0.2cm width x 0.1cm depth; 0.003cm^3 volume. Character of Wound/Ulcer Post Debridement is improved. Severity of Tissue Post Debridement is: Fat layer exposed. Post procedure Diagnosis Wound #12: Same as Pre-Procedure Plan Follow-up Appointments: Return Appointment in 1 week. - Tuesday w/ Dr. Leanord Hawking Other: - [employer] - call about your compression stockings. Anesthetic: (In clinic) Topical Lidocaine 4% applied to wound  bed Bathing/ Shower/ Hygiene: May shower with protection but do not get wound dressing(s) wet. Protect dressing(s) with water repellant cover (for example, large plastic bag) or a cast cover and may then take shower. Additional Orders / Instructions: Follow Nutritious Diet - increase protein Juven Shake 1-2 times daily. WOUND #12: - Lower Leg Wound Laterality: Left, Anterior, Distal Cleanser: Soap and Water 1 x Per Week/30 Days Discharge Instructions: May shower and wash wound with dial antibacterial soap and water prior to dressing change. Cleanser: Vashe 5.8 (oz) 1 x Per Week/30 Days Discharge Instructions: Cleanse the wound with Vashe prior to applying a clean dressing using gauze sponges, not tissue or cotton balls. Peri-Wound Care: Sween Lotion (Moisturizing lotion) 1 x Per Week/30 Days Discharge Instructions: Apply  moisturizing lotion as directed Prim Dressing: IODOFLEX 0.9% Cadexomer Iodine Pad 4x6 cm 1 x Per Week/30 Days ary Discharge Instructions: Apply to wound bed as instructed Secondary Dressing: ABD Pad, 8x10 1 x Per Week/30 Days Discharge Instructions: Apply over primary dressing as directed. Secondary Dressing: Woven Gauze Sponge, Non-Sterile 4x4 in 1 x Per Week/30 Days Discharge Instructions: Apply over primary dressing as directed. Com pression Wrap: Kerlix Roll 4.5x3.1 (in/yd) 1 x Per Week/30 Days Discharge Instructions: Apply Kerlix and Coban compression as directed. Com pression Wrap: Coban Self-Adherent Wrap 4x5 (in/yd) 1 x Per Week/30 Days Discharge Instructions: Apply over Kerlix as directed. Wound exam; still using Iodoflex for another week endoform would be an option although we apparently do not have that in the clinic. 2. Still requiring debridement Electronic Signature(s) Signed: 10/01/2023 5:13:18 PM By: Baltazar Najjar MD Entered By: Baltazar Najjar on 10/01/2023  12:19:05 -------------------------------------------------------------------------------- SuperBill Details Patient Name: Date of Service: [name] 10/01/2023 Medical Record Number: [id number] Patient Account Number: [id number] Date of Birth/Sex: Treating RN: 02/13/1931 (89 y.o. [name], [name] Primary Care Provider: [name] Other Clinician: Referring Provider: Treating Provider/Extender: [name] in Treatment: 13 Diagnosis Coding ICD-10 Codes Code Description [phone] Non-pressure chronic ulcer of other part of right lower leg with fat layer exposed L97.822 Non-pressure chronic ulcer of other part of left lower leg with fat layer exposed I89.0 Lymphedema, not elsewhere classified E11.622 Type 2 diabetes mellitus with other skin ulcer I50.42 Chronic combined systolic (congestive) and diastolic (congestive) heart failure [name], [name] ([id number]) [id number] Page 7 of 7 Facility Procedures : CPT4 Code: [id number] Description: 11042 - DEB SUBQ TISSUE 20 SQ CM/< ICD-10 Diagnosis Description L97.822 Non-pressure chronic ulcer of other part of left lower leg with fat layer expo Modifier: sed Quantity: 1 Physician Procedures : CPT4 Code Description Modifier [id number] 11042 - WC PHYS SUBQ TISS 20 SQ CM ICD-10 Diagnosis Description L97.822 Non-pressure chronic ulcer of other part of left lower leg with fat layer exposed Quantity: 1 Electronic Signature(s) Signed: 10/01/2023 5:13:18 PM By: Baltazar Najjar MD Entered By: Baltazar Najjar on 10/01/2023 12:19:15

## 2023-10-08 ENCOUNTER — Encounter (HOSPITAL_BASED_OUTPATIENT_CLINIC_OR_DEPARTMENT_OTHER): Payer: Medicare PPO | Attending: Internal Medicine | Admitting: Internal Medicine

## 2023-10-08 DIAGNOSIS — L97822 Non-pressure chronic ulcer of other part of left lower leg with fat layer exposed: Secondary | ICD-10-CM | POA: Insufficient documentation

## 2023-10-08 DIAGNOSIS — E11622 Type 2 diabetes mellitus with other skin ulcer: Secondary | ICD-10-CM | POA: Insufficient documentation

## 2023-10-08 DIAGNOSIS — I890 Lymphedema, not elsewhere classified: Secondary | ICD-10-CM | POA: Diagnosis not present

## 2023-10-08 DIAGNOSIS — I5042 Chronic combined systolic (congestive) and diastolic (congestive) heart failure: Secondary | ICD-10-CM | POA: Insufficient documentation

## 2023-10-09 NOTE — Progress Notes (Signed)
[name], [name] ([id number]) [phone].pdf Page 1 of 6 Visit Report for 10/08/2023 HPI Details Patient Name: Date of Service: [name], [name] 10/08/2023 10:30 A M Medical Record Number: [id number] Patient Account Number: [id number] Date of Birth/Sex: Treating RN: [birth year] (89 y.o. F) Primary Care Provider: [name] Other Clinician: Referring Provider: Treating Provider/Extender: [name] in Treatment: 14 History of Present Illness HPI Description: 07/02/2023 Ms. 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 She is not quite sure how the right upper leg wound started. The left lower extremity wound started out as blisters. She has compression stockings but has not been wearing them. She takes Lasix 20 mg daily. She currently denies signs of infection. In office ABIs were 0.57 on the left however dorsalis pedal pulses and posterior tibialis pulses were heard on Doppler. 8/6; patient presents for follow-up. We have been using antibiotic ointment with Hydrofera Blue under Kerlix/Coban to the left lower extremity. Hydrofera Blue to the right lower leg. She came in for nurse visit and wrap change. She reports no issues. She reports that she is scheduled for her ABIs next week. 8/13; patient presents for follow-up. We have been using Hydrofera Blue and Santyl to the left lower extremity under Kerlix/Coban. She has been using Hydrofera Blue to the right upper leg wound. She had ABIs completed yesterday that showed an ABI of 0.91 on the left and 0.92 on the right. She has no issues or complaints today. 8/20; patient presents for follow-up. We have been using Hydrofera Blue and Santyl to the left  lower extremity under 3 layer compression and Hydrofera Blue to the right upper leg wound. The right upper leg wound has healed. She feels that the wrap was too tight with the 3 layer. We will go back down to Kerlix/Coban. Other than that she has no issues or complaints. She denies signs of infection. Wounds Appear smaller. 8/27; patient presents for follow-up. We have been using Hydrofera Blue and Santyl to the left lower extremity wounds under Kerlix/Coban. She tolerated this well. She has no issues or complaints today. Wounds are smaller. 9/3; patient presents for follow-up. We have been using Hydrofera Blue and Santyl to the left lower extremity wound under Kerlix/Coban. Wound is smaller. 9/10; patient presents for follow-up. We have been using Hydrofera Blue and Santyl to the lower extremity under Kerlix/Coban. Wound appears smaller and well-healing. 9/17 continued improvement we have been using Santyl and Hydrofera Blue under kerlix Coban to the left leg wound largely secondary to chronic lymphedema 9/24; continued improvement in the wound size on the left anterior mid tibia. This is in the setting of significant left greater than right lower leg lymphedema. Initially the patient came in with wounds on both legs however the right leg is healed. The wound on the left leg is smaller. We will go ahead and order bilateral juxta lite stockings today 10/1; still a small open wound on the left anterior lower leg in the setting of chronic venous insufficiency and lymphedema. We have been using Santyl and Hydrofera Blue under Kerlix Coban. Everything is closed on the right leg and she has her own juxta lite which she will just the 30/40 mm compression. 10/15; still a small punched-out area on the left anterior lower leg in the  setting of chronic venous insufficiency and lymphedema. I changed her to Iodoflex last week. She is using juxta lites to the right leg and so far things seem stable here. 10/22;  same small punched-out area in the left anterior mid tibia. Setting of chronic venous insufficiency and lymphedema. I changed her to Iodoflex flex last week using juxta lites on the right leg and we are wrapping the left leg. Everything else is healed 10/29; small punched-out area on the left anterior mid tibia once again a nonviable surface. I used Iodoflex for the last 3 weeks. Prior to that [facility]. We still have trouble with the viability of the surface 11/5; still no improvement in the small wound on the left anterior lower leg I have been using Iodoflex for about 4 weeks prior to that Santyl really not able to get this to fully granulate. The wound does not probe to bone Electronic Signature(s) Signed: 10/08/2023 4:32:14 PM By: Baltazar Najjar MD Entered By: Baltazar Najjar on 10/08/2023 11:59:37 Physical Exam Details -------------------------------------------------------------------------------- [name] ([id number]) [phone].pdf Page 2 of 6 Patient Name: Date of Service: [name], [name] 10/08/2023 10:30 A M Medical Record Number: [id number] Patient Account Number: [id number] Date of Birth/Sex: Treating RN: Apr 11, 1931 (89 y.o. F) Primary Care Provider: [name] Other Clinician: Referring Provider: Treating Provider/Extender: [name] in Treatment: 14 Constitutional Patient is hypertensive.. Pulse regular and within target range for patient.Marland Kitchen Respirations regular, non-labored and within target range.. Temperature is normal and within the target range for the patient.Marland Kitchen Appears in no distress. Notes Wound exam; left anterior mid tibia. Small wound roughly 2 to 3 mm in depth but no palpable bone. No evidence of surrounding infection. I debrided this last week did not debride this again today Electronic Signature(s) Signed: 10/08/2023 4:32:14 PM By: Baltazar Najjar MD Entered By: Baltazar Najjar on 10/08/2023  12:00:23 -------------------------------------------------------------------------------- Physician Orders Details Patient Name: Date of Service: [name] 10/08/2023 10:30 A M Medical Record Number: [id number] Patient Account Number: [id number] Date of Birth/Sex: Treating RN: 1931/10/03 (89 y.o. [name], [name] Primary Care Provider: [name] Other Clinician: Referring Provider: Treating Provider/Extender: [name] in Treatment: 14 The following information was scribed by: [name] The information was scribed for: Baltazar Najjar Verbal / Phone Orders: No Diagnosis Coding ICD-10 Coding Code Description [phone] Non-pressure chronic ulcer of other part of left lower leg with fat layer exposed I89.0 Lymphedema, not elsewhere classified E11.622 Type 2 diabetes mellitus with other skin ulcer I50.42 Chronic combined systolic (congestive) and diastolic (congestive) heart failure Follow-up Appointments ppointment in 1 week. - Tuesday w/ Dr. Leanord Hawking 10/15/2023 1115 Return A ppointment in 2 weeks. - 10/22/2023 1115 Dr. Leanord Hawking Tuesday Return A Nurse Visit: - 10/29/2023 Tuesday Other: - [employer] - call about your compression stockings. Anesthetic (In clinic) Topical Lidocaine 4% applied to wound bed Bathing/ Shower/ Hygiene May shower with protection but do not get wound dressing(s) wet. Protect dressing(s) with water repellant cover (for example, large plastic bag) or a cast cover and may then take shower. Additional Orders / Instructions Follow Nutritious Diet - increase protein Juven Shake 1-2 times daily. Wound Treatment Wound #12 - Lower Leg Wound Laterality: Left, Anterior, Distal Cleanser: Soap and Water 1 x Per Week/30 Days Discharge Instructions: May shower and wash wound with dial antibacterial soap and water prior to dressing change. Cleanser: Vashe 5.8 (oz) 1 x Per Week/30 Days Discharge Instructions: Cleanse  the wound with  Vashe prior to applying a clean dressing using gauze sponges, not tissue or cotton balls. Peri-Wound Care: Sween Lotion (Moisturizing lotion) 1 x Per Week/30 Days [name], [name] ([id number]) [phone].pdf Page 3 of 6 Discharge Instructions: Apply moisturizing lotion as directed Prim Dressing: Endoform 2x2 in 1 x Per Week/30 Days ary Discharge Instructions: Moisten with saline Secondary Dressing: ABD Pad, 8x10 1 x Per Week/30 Days Discharge Instructions: Apply over primary dressing as directed. Secondary Dressing: Woven Gauze Sponge, Non-Sterile 4x4 in 1 x Per Week/30 Days Discharge Instructions: Apply over primary dressing as directed. Compression Wrap: Kerlix Roll 4.5x3.1 (in/yd) 1 x Per Week/30 Days Discharge Instructions: Apply Kerlix and Coban compression as directed. Compression Wrap: Coban Self-Adherent Wrap 4x5 (in/yd) 1 x Per Week/30 Days Discharge Instructions: Apply over Kerlix as directed. Electronic Signature(s) Signed: 10/08/2023 12:20:53 PM By: [name] RN, BSN Signed: 10/08/2023 4:32:14 PM By: Baltazar Najjar MD Entered By: [name] on 10/08/2023 11:18:51 -------------------------------------------------------------------------------- Problem List Details Patient Name: Date of Service: [name] 10/08/2023 10:30 A M Medical Record Number: [id number] Patient Account Number: [id number] Date of Birth/Sex: Treating RN: August 02, 1931 (89 y.o. [name], [name] Primary Care Provider: [name] Other Clinician: Referring Provider: Treating Provider/Extender: [name] in Treatment: 14 Active Problems ICD-10 Encounter Code Description Active Date MDM Diagnosis [phone] Non-pressure chronic ulcer of other part of left lower leg with fat layer exposed7/30/2024 No Yes I89.0 Lymphedema, not elsewhere classified 07/02/2023 No Yes E11.622 Type 2 diabetes mellitus with other skin ulcer  07/02/2023 No Yes I50.42 Chronic combined systolic (congestive) and diastolic (congestive) heart failure 07/02/2023 No Yes Inactive Problems ICD-10 Code Description Active Date Inactive Date L97.812 Non-pressure chronic ulcer of other part of right lower leg with fat layer exposed 07/02/2023 07/02/2023 Resolved Problems Electronic Signature(s) JOLAYNE, BRANSON ([id number]) [phone].pdf Page 4 of 6 Signed: 10/08/2023 4:32:14 PM By: Baltazar Najjar MD Entered By: Baltazar Najjar on 10/08/2023 11:56:52 -------------------------------------------------------------------------------- Progress Note Details Patient Name: Date of Service: [name] 10/08/2023 10:30 A M Medical Record Number: [id number] Patient Account Number: [id number] Date of Birth/Sex: Treating RN: 04/29/1931 (89 y.o. F) Primary Care Provider: [name] Other Clinician: Referring Provider: Treating Provider/Extender: [name] in Treatment: 14 Subjective History of Present Illness (HPI) 07/02/2023 Ms. 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 She is not quite sure how the right upper leg wound started. The left lower extremity wound started out as blisters. She has compression stockings but has not been wearing them. She takes Lasix 20 mg daily. She currently denies signs of infection. In office ABIs were 0.57 on the left however dorsalis pedal pulses and posterior tibialis pulses were heard on Doppler. 8/6; patient presents for follow-up. We have been using antibiotic ointment with Hydrofera Blue under Kerlix/Coban to the left lower extremity. Hydrofera Blue to the right lower leg. She came in for nurse visit and wrap  change. She reports no issues. She reports that she is scheduled for her ABIs next week. 8/13; patient presents for follow-up. We have been using Hydrofera Blue and Santyl to the left lower extremity under Kerlix/Coban. She has been using Hydrofera Blue to the right upper leg wound. She had ABIs completed yesterday that showed an ABI of 0.91 on the left and  0.92 on the right. She has no issues or complaints today. 8/20; patient presents for follow-up. We have been using Hydrofera Blue and Santyl to the left lower extremity under 3 layer compression and Hydrofera Blue to the right upper leg wound. The right upper leg wound has healed. She feels that the wrap was too tight with the 3 layer. We will go back down to Kerlix/Coban. Other than that she has no issues or complaints. She denies signs of infection. Wounds Appear smaller. 8/27; patient presents for follow-up. We have been using Hydrofera Blue and Santyl to the left lower extremity wounds under Kerlix/Coban. She tolerated this well. She has no issues or complaints today. Wounds are smaller. 9/3; patient presents for follow-up. We have been using Hydrofera Blue and Santyl to the left lower extremity wound under Kerlix/Coban. Wound is smaller. 9/10; patient presents for follow-up. We have been using Hydrofera Blue and Santyl to the lower extremity under Kerlix/Coban. Wound appears smaller and well-healing. 9/17 continued improvement we have been using Santyl and Hydrofera Blue under kerlix Coban to the left leg wound largely secondary to chronic lymphedema 9/24; continued improvement in the wound size on the left anterior mid tibia. This is in the setting of significant left greater than right lower leg lymphedema. Initially the patient came in with wounds on both legs however the right leg is healed. The wound on the left leg is smaller. We will go ahead and order bilateral juxta lite stockings today 10/1; still a small open wound on the left  anterior lower leg in the setting of chronic venous insufficiency and lymphedema. We have been using Santyl and Hydrofera Blue under Kerlix Coban. Everything is closed on the right leg and she has her own juxta lite which she will just the 30/40 mm compression. 10/15; still a small punched-out area on the left anterior lower leg in the setting of chronic venous insufficiency and lymphedema. I changed her to Iodoflex last week. She is using juxta lites to the right leg and so far things seem stable here. 10/22; same small punched-out area in the left anterior mid tibia. Setting of chronic venous insufficiency and lymphedema. I changed her to Iodoflex flex last week using juxta lites on the right leg and we are wrapping the left leg. Everything else is healed 10/29; small punched-out area on the left anterior mid tibia once again a nonviable surface. I used Iodoflex for the last 3 weeks. Prior to that [facility]. We still have trouble with the viability of the surface 11/5; still no improvement in the small wound on the left anterior lower leg I have been using Iodoflex for about 4 weeks prior to that Santyl really not able to get this to fully granulate. The wound does not probe to bone Objective Constitutional Patient is hypertensive.. Pulse regular and within target range for patient.Marland Kitchen Respirations regular, non-labored and within target range.. Temperature is normal and within the target range for the patient.Marland Kitchen Appears in no distress. Vitals Time Taken: 10:56 AM, Height: 62 in, Weight: 220 lbs, BMI: 40.2, Temperature: 97.8 F, Pulse: 69 bpm, Respiratory Rate: 18 breaths/min, Blood [name], [name] ([id number]) [phone].pdf Page 5 of 6 Pressure: 156/74 mmHg. General Notes: Wound exam; left anterior mid tibia. Small wound roughly 2 to 3 mm in depth but no palpable bone. No evidence of surrounding infection. I debrided this last week did not debride this again  today Integumentary (Hair, Skin) Wound #12 status is Open. Original cause of wound was Blister.  The date acquired was: 06/12/2023. The wound has been in treatment 14 weeks. The wound is located on the [name] Lower Leg. The wound measures 0.2cm length x 0.2cm width x 0.2cm depth; 0.031cm^2 area and 0.006cm^3 volume. There is no tunneling or undermining noted. There is a medium amount of serosanguineous drainage noted. The wound margin is distinct with the outline attached to the wound base. There is no granulation within the wound bed. There is a large (67-100%) amount of necrotic tissue within the wound bed including Adherent Slough. The periwound skin appearance exhibited: Scarring, Hemosiderin Staining. The periwound skin appearance did not exhibit: Callus, Crepitus, Excoriation, Induration, Rash, Dry/Scaly, Maceration, Atrophie Blanche, Cyanosis, Ecchymosis, Mottled, Pallor, Rubor, Erythema. Periwound temperature was noted as No Abnormality. The periwound has tenderness on palpation. Assessment Active Problems ICD-10 Non-pressure chronic ulcer of other part of left lower leg with fat layer exposed Lymphedema, not elsewhere classified Type 2 diabetes mellitus with other skin ulcer Chronic combined systolic (congestive) and diastolic (congestive) heart failure Plan Follow-up Appointments: Return Appointment in 1 week. - Tuesday w/ Dr. Leanord Hawking 10/15/2023 1115 Return Appointment in 2 weeks. - 10/22/2023 1115 Dr. Leanord Hawking Tuesday Nurse Visit: - 10/29/2023 Tuesday Other: - [employer] - call about your compression stockings. Anesthetic: (In clinic) Topical Lidocaine 4% applied to wound bed Bathing/ Shower/ Hygiene: May shower with protection but do not get wound dressing(s) wet. Protect dressing(s) with water repellant cover (for example, large plastic bag) or a cast cover and may then take shower. Additional Orders / Instructions: Follow Nutritious Diet - increase  protein Juven Shake 1-2 times daily. WOUND #12: - Lower Leg Wound Laterality: Left, Anterior, Distal Cleanser: Soap and Water 1 x Per Week/30 Days Discharge Instructions: May shower and wash wound with dial antibacterial soap and water prior to dressing change. Cleanser: Vashe 5.8 (oz) 1 x Per Week/30 Days Discharge Instructions: Cleanse the wound with Vashe prior to applying a clean dressing using gauze sponges, not tissue or cotton balls. Peri-Wound Care: Sween Lotion (Moisturizing lotion) 1 x Per Week/30 Days Discharge Instructions: Apply moisturizing lotion as directed Prim Dressing: Endoform 2x2 in 1 x Per Week/30 Days ary Discharge Instructions: Moisten with saline Secondary Dressing: ABD Pad, 8x10 1 x Per Week/30 Days Discharge Instructions: Apply over primary dressing as directed. Secondary Dressing: Woven Gauze Sponge, Non-Sterile 4x4 in 1 x Per Week/30 Days Discharge Instructions: Apply over primary dressing as directed. Com pression Wrap: Kerlix Roll 4.5x3.1 (in/yd) 1 x Per Week/30 Days Discharge Instructions: Apply Kerlix and Coban compression as directed. Com pression Wrap: Coban Self-Adherent Wrap 4x5 (in/yd) 1 x Per Week/30 Days Discharge Instructions: Apply over Kerlix as directed. 1. I have changed to endoform. I did not debride this today but will consider this next week once again. Previous debridements have not really altered anything with the wound looking the same as it did prior to 2 debridement the next week. 2. I do not see any other variables. This does not look infected it does not probe to bone. ABI on the left was 0.91 Electronic Signature(s) Signed: 10/08/2023 4:32:14 PM By: Baltazar Najjar MD Entered By: Baltazar Najjar on 10/08/2023 12:01:48 [name] ([id number]) [id number].pdf Page 6 of 6 -------------------------------------------------------------------------------- SuperBill Details Patient Name: Date of  Service: [name], [name] 10/08/2023 Medical Record Number: [id number] Patient Account Number: [id number] Date of Birth/Sex: Treating RN: 02-May-1931 (89 y.o. [name] Primary Care Provider: [name] Other Clinician: Referring Provider: Treating Provider/Extender: [name] in  Treatment: 14 Diagnosis Coding ICD-10 Codes Code Description [phone] Non-pressure chronic ulcer of other part of left lower leg with fat layer exposed I89.0 Lymphedema, not elsewhere classified E11.622 Type 2 diabetes mellitus with other skin ulcer I50.42 Chronic combined systolic (congestive) and diastolic (congestive) heart failure Facility Procedures : CPT4 Code: [id number] Description: 99213 - WOUND CARE VISIT-LEV 3 EST PT Modifier: Quantity: 1 Physician Procedures : CPT4 Code Description Modifier [id number] 99213 - WC PHYS LEVEL 3 - EST PT ICD-10 Diagnosis Description L97.822 Non-pressure chronic ulcer of other part of left lower leg with fat layer exposed I89.0 Lymphedema, not elsewhere classified E11.622 Type 2  diabetes mellitus with other skin ulcer Quantity: 1 Electronic Signature(s) Signed: 10/08/2023 4:32:14 PM By: Baltazar Najjar MD Entered By: Baltazar Najjar on 10/08/2023 12:02:06

## 2023-10-11 NOTE — Progress Notes (Signed)
[name], [name] ([id number]) [id number].pdf Page 1 of 10 Visit Report for 10/08/2023 Arrival Information Details Patient Name: Date of Service: [name], [name] 10/08/2023 10:30 A M Medical Record Number: [id number] Patient Account Number: [id number] Date of Birth/Sex: Treating RN: Nov 21, 1931 (89 y.o. F) Primary Care Jadarion Halbig: [name] Other Clinician: Referring Leathia Farnell: Treating Kabrina Christiano/Extender: [name] in Treatment: 14 Visit Information History Since Last Visit Added or deleted any medications: No Patient Arrived: [name] Any new allergies or adverse reactions: No Arrival Time: 10:56 Had a fall or experienced change in No Accompanied By: self activities of daily living that may affect Transfer Assistance: None risk of falls: Patient Identification Verified: Yes Signs or symptoms of abuse/neglect since last visito No Secondary Verification Process Completed: Yes Hospitalized since last visit: No Patient Requires Transmission-Based Precautions: No Implantable device outside of the clinic excluding No Patient Has Alerts: Yes cellular tissue based products placed in the center Patient Alerts: ABIs: R:0.92 L:0.91 8/24 since last visit: Has Dressing in Place as Prescribed: Yes Has Compression in Place as Prescribed: Yes Pain Present Now: No Electronic Signature(s) Signed: 10/11/2023 12:52:25 PM By: Thayer Dallas Entered By: Thayer Dallas on 10/08/2023 10:56:32 -------------------------------------------------------------------------------- Clinic Level of Care Assessment Details Patient Name: Date of Service: [name], [name] 10/08/2023 10:30 A M Medical Record Number: [id number] Patient Account Number: [id number] Date of Birth/Sex: Treating RN: November 30, 1931 (89 y.o. [name] Primary Care Jozlin Bently: [name] Other Clinician: Referring Amri Lien: Treating Miria Cappelli/Extender: [name] in Treatment: 14 Clinic Level of Care Assessment Items TOOL 4 Quantity Score X- 1 0 Use when only an EandM is performed on FOLLOW-UP visit ASSESSMENTS - Nursing Assessment / Reassessment X- 1 10 Reassessment of Co-morbidities (includes updates in patient status) X- 1 5 Reassessment of Adherence to Treatment Plan ASSESSMENTS - Wound and Skin A ssessment / Reassessment X - Simple Wound Assessment / Reassessment - one wound 1 5 []  - 0 Complex Wound Assessment / Reassessment - multiple wounds X- 1 10 Dermatologic / Skin Assessment (not related to wound area) ASSESSMENTS - Focused Assessment X- 1 5 Circumferential Edema Measurements - multi extremities []  - 0 Nutritional Assessment / Counseling / Intervention SRISTI, OLLILA ([id number]) [id number] Page 2 of 10 []  - 0 Lower Extremity Assessment (monofilament, tuning fork, pulses) []  - 0 Peripheral Arterial Disease Assessment (using hand held doppler) ASSESSMENTS - Ostomy and/or Continence Assessment and Care []  - 0 Incontinence Assessment and Management []  - 0 Ostomy Care Assessment and Management (repouching, etc.) PROCESS - Coordination of Care X - Simple Patient / Family Education for ongoing care 1 15 []  - 0 Complex (extensive) Patient / Family Education for ongoing care X- 1 10 Staff obtains Chiropractor, Records, T Results / Process Orders est []  - 0 Staff telephones HHA, Nursing Homes / Clarify orders / etc []  - 0 Routine Transfer to another Facility (non-emergent condition) []  - 0 Routine Hospital Admission (non-emergent condition) []  - 0 New Admissions / Manufacturing engineer / Ordering NPWT Apligraf, etc. , []  - 0 Emergency Hospital Admission (emergent condition) X- 1 10 Simple Discharge Coordination []  - 0 Complex (extensive) Discharge Coordination PROCESS - Special Needs []  - 0 Pediatric / Minor Patient Management []  - 0 Isolation Patient  Management []  - 0 Hearing / Language / Visual special needs []  - 0 Assessment of Community assistance (transportation, D/C planning, etc.) []  - 0 Additional assistance / Altered mentation []  - 0 Support Surface(s) Assessment (bed, cushion,  seat, etc.) INTERVENTIONS - Wound Cleansing / Measurement X - Simple Wound Cleansing - one wound 1 5 []  - 0 Complex Wound Cleansing - multiple wounds X- 1 5 Wound Imaging (photographs - any number of wounds) []  - 0 Wound Tracing (instead of photographs) X- 1 5 Simple Wound Measurement - one wound []  - 0 Complex Wound Measurement - multiple wounds INTERVENTIONS - Wound Dressings []  - 0 Small Wound Dressing one or multiple wounds []  - 0 Medium Wound Dressing one or multiple wounds X- 1 20 Large Wound Dressing one or multiple wounds []  - 0 Application of Medications - topical []  - 0 Application of Medications - injection INTERVENTIONS - Miscellaneous []  - 0 External ear exam []  - 0 Specimen Collection (cultures, biopsies, blood, body fluids, etc.) []  - 0 Specimen(s) / Culture(s) sent or taken to Lab for analysis []  - 0 Patient Transfer (multiple staff / Nurse, adult / Similar devices) []  - 0 Simple Staple / Suture removal (25 or less) []  - 0 Complex Staple / Suture removal (26 or more) []  - 0 Hypo / Hyperglycemic Management (close monitor of Blood Glucose) [name], [name] ([id number]) [id number].pdf Page 3 of 10 []  - 0 Ankle / Brachial Index (ABI) - do not check if billed separately X- 1 5 Vital Signs Has the patient been seen at the hospital within the last three years: Yes Total Score: 110 Level Of Care: New/Established - Level 3 Electronic Signature(s) Signed: 10/08/2023 12:20:53 PM By: [name] RN, BSN Entered By: [name] on 10/08/2023 11:19:34 -------------------------------------------------------------------------------- Encounter Discharge Information Details Patient Name: Date of  Service: [name] 10/08/2023 10:30 A M Medical Record Number: [id number] Patient Account Number: [id number] Date of Birth/Sex: Treating RN: [birth year] (89 y.o. [name] Primary Care Flavia Bruss: [name] Other Clinician: Referring Khalib Fendley: Treating Marcelus Dubberly/Extender: [name] in Treatment: 14 Encounter Discharge Information Items Discharge Condition: Stable Ambulatory Status: Walker Discharge Destination: Home Transportation: Private Auto Accompanied By: friend Schedule Follow-up Appointment: Yes Clinical Summary of Care: Electronic Signature(s) Signed: 10/08/2023 12:20:53 PM By: [name] RN, BSN Entered By: [name] on 10/08/2023 11:20:32 -------------------------------------------------------------------------------- Lower Extremity Assessment Details Patient Name: Date of Service: [name], [name] 10/08/2023 10:30 A M Medical Record Number: [id number] Patient Account Number: [id number] Date of Birth/Sex: Treating RN: 03/01/1931 (89 y.o. F) Primary Care Retia Cordle: [name] Other Clinician: Referring Jahliyah Trice: Treating Jacynda Brunke/Extender: [name] in Treatment: 14 Edema Assessment Assessed: Kyra Searles: No] [Right: No] Edema: [Left: Ye] [Right: s] Calf Left: Right: Point of Measurement: 32 cm From Medial Instep 40 cm Ankle Left: Right: Point of Measurement: 11 cm From Medial Instep 21.5 cm Vascular Assessment Left: [[id number] Page 4 of 10Right:] Extremity colors, hair growth, and conditions: Extremity Color: [phone].pdf Page 4 of 10Normal] Hair Growth on Extremity: [phone].pdf Page 4 of 10Yes] Temperature of Extremity: [phone].pdf Page 4 of 10Hot] Capillary Refill: [phone].pdf Page 4 of 10< 3 seconds] Dependent Rubor:  [phone].pdf Page 4 of 10No No] Electronic Signature(s) Signed: 10/11/2023 12:52:25 PM By: Thayer Dallas Entered By: Thayer Dallas on 10/08/2023 10:57:10 -------------------------------------------------------------------------------- Multi Wound Chart Details Patient Name: Date of Service: [name] 10/08/2023 10:30 A M Medical Record Number: [id number] Patient Account Number: [id number] Date of Birth/Sex: Treating RN: 12/27/1930 (89 y.o. F) Primary Care Romeka Scifres: [name] Other Clinician: Referring Osias Resnick: Treating Jayme Mednick/Extender: [name] in Treatment: 14 Vital Signs Height(in): 62 Pulse(bpm): 69 Weight(lbs): 220 Blood Pressure(mmHg): 156/74 Body  Mass Index(BMI): 40.2 Temperature(F): 97.8 Respiratory Rate(breaths/min): 18 [12:Photos:] [N/A:N/A] Left, Distal, Anterior Lower Leg N/A N/A Wound Location: Blister N/A N/A Wounding Event: 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 Lymphedema N/A N/A Secondary Etiology: Anemia, Congestive Heart Failure, N/A N/A Comorbid History: Hypertension, Peripheral Venous Disease, Type II Diabetes, Osteoarthritis, Neuropathy 06/12/2023 N/A N/A Date Acquired: 14 N/A N/A Weeks of Treatment: Open N/A N/A Wound Status: No N/A N/A Wound Recurrence: 0.2x0.2x0.2 N/A N/A Measurements L x W x D (cm) 0.031 N/A N/A A (cm) : rea 0.006 N/A N/A Volume (cm) : 99.20% N/A N/A % Reduction in A rea: 98.40% N/A N/A % Reduction in Volume: Grade 1 N/A N/A Classification: Medium N/A N/A Exudate A mount: Serosanguineous N/A N/A Exudate Type: red, brown N/A N/A Exudate Color: Distinct, outline attached N/A N/A Wound Margin: None Present (0%) N/A N/A Granulation A mount: Large (67-100%) N/A N/A Necrotic A mount: Fascia: No N/A N/A Exposed Structures: Fat Layer (Subcutaneous Tissue): No Tendon: No Muscle: No Joint: No [name], [name] ([id number]) [id number].pdf Page 5 of 10 Bone: No Large (67-100%) N/A N/A Epithelialization: Scarring: Yes N/A N/A Periwound Skin Texture: Excoriation: No Induration: No Callus: No Crepitus: No Rash: No Maceration: No N/A N/A Periwound Skin Moisture: Dry/Scaly: No Hemosiderin Staining: Yes N/A N/A Periwound Skin Color: Atrophie Blanche: No Cyanosis: No Ecchymosis: No Erythema: No Mottled: No Pallor: No Rubor: No No Abnormality N/A N/A Temperature: Yes N/A N/A Tenderness on Palpation: Treatment Notes Wound #12 (Lower Leg) Wound Laterality: Left, Anterior, Distal Cleanser Soap and Water Discharge Instruction: May shower and wash wound with dial antibacterial soap and water prior to dressing change. Vashe 5.8 (oz) Discharge Instruction: Cleanse the wound with Vashe prior to applying a clean dressing using gauze sponges, not tissue or cotton balls. Peri-Wound Care Sween Lotion (Moisturizing lotion) Discharge Instruction: Apply moisturizing lotion as directed Topical Primary Dressing Endoform 2x2 in Discharge Instruction: Moisten with saline Secondary Dressing ABD Pad, 8x10 Discharge Instruction: Apply over primary dressing as directed. Woven Gauze Sponge, Non-Sterile 4x4 in Discharge Instruction: Apply over primary dressing as directed. Secured With Compression Wrap Kerlix Roll 4.5x3.1 (in/yd) Discharge Instruction: Apply Kerlix and Coban compression as directed. Coban Self-Adherent Wrap 4x5 (in/yd) Discharge Instruction: Apply over Kerlix as directed. Compression Stockings Add-Ons Electronic Signature(s) Signed: 10/08/2023 4:32:14 PM By: Baltazar Najjar MD Entered By: Baltazar Najjar on 10/08/2023 11:57:09 -------------------------------------------------------------------------------- Multi-Disciplinary Care Plan Details Patient Name: Date of Service: [name] 10/08/2023 10:30 A M Medical Record Number:  [id number] Patient Account Number: [id number] Date of Birth/Sex: Treating RN: 10/23/1931 (89 y.o. [name] [place], [name] ([id number]) [id number] Page 6 of 10 Primary Care Karmon Andis: [name] Other Clinician: Referring Demarrio Menges: Treating Danie Hannig/Extender: [name] in Treatment: 14 Active Inactive Nutrition Nursing Diagnoses: Potential for alteratiion in Nutrition/Potential for imbalanced nutrition Goals: Patient/caregiver agrees to and verbalizes understanding of need to obtain nutritional consultation Date Initiated: 07/02/2023 Date Inactivated: 07/30/2023 Target Resolution Date: 08/02/2023 Goal Status: Met Patient/caregiver will maintain therapeutic glucose control Date Initiated: 07/02/2023 Target Resolution Date: 11/01/2023 Goal Status: Active Interventions: Assess HgA1c results as ordered upon admission and as needed Provide education on elevated blood sugars and impact on wound healing Provide education on nutrition Treatment Activities: Patient referred to Primary Care Physician for further nutritional evaluation : 07/02/2023 Notes: Pain, Acute or Chronic Nursing Diagnoses: Pain, acute or chronic: actual or potential Potential alteration in comfort, pain Goals: Patient will verbalize adequate pain control and receive pain  control interventions during procedures as needed Date Initiated: 07/02/2023 Date Inactivated: 07/30/2023 Target Resolution Date: 08/02/2023 Goal Status: Met Patient/caregiver will verbalize comfort level met Date Initiated: 07/02/2023 Target Resolution Date: 11/01/2023 Goal Status: Active Interventions: Encourage patient to take pain medications as prescribed Provide education on pain management Reposition patient for comfort Treatment Activities: Administer pain control measures as ordered : 07/02/2023 Notes: Wound/Skin Impairment Nursing Diagnoses: Knowledge deficit  related to ulceration/compromised skin integrity Goals: Patient/caregiver will verbalize understanding of skin care regimen Date Initiated: 07/02/2023 Target Resolution Date: 10/31/2023 Goal Status: Active Interventions: Assess patient/caregiver ability to perform ulcer/skin care regimen upon admission and as needed Assess ulceration(s) every visit Provide education on ulcer and skin care Treatment Activities: Skin care regimen initiated : 07/02/2023 Topical wound management initiated : 07/02/2023 Notes: Electronic Signature(s) [name], [name] ([id number]) [phone].pdf Page 7 of 10 Signed: 10/08/2023 12:20:53 PM By: [name] RN, BSN Entered By: [name] on 10/08/2023 11:16:39 -------------------------------------------------------------------------------- Pain Assessment Details Patient Name: Date of Service: [name], [name] 10/08/2023 10:30 A M Medical Record Number: [id number] Patient Account Number: [id number] Date of Birth/Sex: Treating RN: 1930-12-13 (89 y.o. F) Primary Care Breeana Sawtelle: [name] Other Clinician: Referring Yaslene Lindamood: Treating Lasaro Primm/Extender: [name] in Treatment: 14 Active Problems Location of Pain Severity and Description of Pain Patient Has Paino No Site Locations Pain Management and Medication Current Pain Management: Electronic Signature(s) Signed: 10/11/2023 12:52:25 PM By: Thayer Dallas Entered By: Thayer Dallas on 10/08/2023 10:56:58 -------------------------------------------------------------------------------- Patient/Caregiver Education Details Patient Name: Date of Service: [name] 11/5/2024andnbsp10:30 A M Medical Record Number: [id number] Patient Account Number: [id number] Date of Birth/Gender: Treating RN: 09-28-1931 (89 y.o. [name], Yvonne Kendall Primary Care Physician: [name] Other Clinician: Referring Physician: Treating  Physician/Extender: [name] in Treatment: 14 Education Assessment Education Provided To: Patient [name], [name] ([id number]) [id number] Page 8 of 10 Education Topics Provided Wound/Skin Impairment: Handouts: Caring for Your Ulcer Methods: Explain/Verbal Responses: Reinforcements needed Electronic Signature(s) Signed: 10/08/2023 12:20:53 PM By: [name] RN, BSN Entered By: [name] on 10/08/2023 11:16:50 -------------------------------------------------------------------------------- Wound Assessment Details Patient Name: Date of Service: [name] 10/08/2023 10:30 A M Medical Record Number: [id number] Patient Account Number: [id number] Date of Birth/Sex: Treating RN: July 22, 1931 (89 y.o. F) Primary Care Gorman Safi: [name] Other Clinician: Referring Milady Fleener: Treating Tyeisha Dinan/Extender: [name] in Treatment: 14 Wound Status Wound Number: 12 Primary Diabetic Wound/Ulcer of the Lower Extremity Etiology: Wound Location: Left, Distal, Anterior Lower Leg Secondary Lymphedema Wounding Event: Blister Etiology: Date Acquired: 06/12/2023 Wound Open Weeks Of Treatment: 14 Status: Clustered Wound: No Comorbid Anemia, Congestive Heart Failure, Hypertension, Peripheral History: Venous Disease, Type II Diabetes, Osteoarthritis, Neuropathy Photos Wound Measurements Length: (cm) 0.2 Width: (cm) 0.2 Depth: (cm) 0.2 Area: (cm) 0.031 Volume: (cm) 0.006 % Reduction in Area: 99.2% % Reduction in Volume: 98.4% Epithelialization: Large (67-100%) Tunneling: No Undermining: No Wound Description Classification: Grade 1 Wound Margin: Distinct, outline attached Exudate Amount: Medium Exudate Type: Serosanguineous Exudate Color: red, brown Foul Odor After Cleansing: No Slough/Fibrino Yes Wound Bed Granulation Amount: None Present (0%) Exposed Structure Necrotic  Amount: Large (67-100%) Fascia Exposed: No Necrotic Quality: Adherent Slough Fat Layer (Subcutaneous Tissue) Exposed: No Tendon Exposed: No Muscle Exposed: No Joint Exposed: No Bone Exposed: No [name], [name] ([id number]) [id number] Page 9 of 10 Periwound Skin Texture Texture Color No Abnormalities Noted: No No Abnormalities Noted: No Callus: No Atrophie Blanche: No Crepitus: No Cyanosis: No Excoriation:  No Ecchymosis: No Induration: No Erythema: No Rash: No Hemosiderin Staining: Yes Scarring: Yes Mottled: No Pallor: No Moisture Rubor: No No Abnormalities Noted: No Dry / Scaly: No Temperature / Pain Maceration: No Temperature: No Abnormality Tenderness on Palpation: Yes Treatment Notes Wound #12 (Lower Leg) Wound Laterality: Left, Anterior, Distal Cleanser Soap and Water Discharge Instruction: May shower and wash wound with dial antibacterial soap and water prior to dressing change. Vashe 5.8 (oz) Discharge Instruction: Cleanse the wound with Vashe prior to applying a clean dressing using gauze sponges, not tissue or cotton balls. Peri-Wound Care Sween Lotion (Moisturizing lotion) Discharge Instruction: Apply moisturizing lotion as directed Topical Primary Dressing Endoform 2x2 in Discharge Instruction: Moisten with saline Secondary Dressing ABD Pad, 8x10 Discharge Instruction: Apply over primary dressing as directed. Woven Gauze Sponge, Non-Sterile 4x4 in Discharge Instruction: Apply over primary dressing as directed. Secured With Compression Wrap Kerlix Roll 4.5x3.1 (in/yd) Discharge Instruction: Apply Kerlix and Coban compression as directed. Coban Self-Adherent Wrap 4x5 (in/yd) Discharge Instruction: Apply over Kerlix as directed. Compression Stockings Add-Ons Electronic Signature(s) Signed: 10/08/2023 12:20:53 PM By: [name] RN, BSN Entered By: [name] on 10/08/2023  11:17:14 -------------------------------------------------------------------------------- Vitals Details Patient Name: Date of Service: [name] 10/08/2023 10:30 A M Medical Record Number: [id number] Patient Account Number: [id number] Date of Birth/Sex: Treating RN: 11/05/1931 (89 y.o. F) Primary Care Alexarae Oliva: [name] Other Clinician: Referring Dorita Rowlands: Treating Jelani Trueba/Extender: [name] in Treatment: [address], [place] Reece Agar ([id number]) [id number] Page 10 of 10 Vital Signs Time Taken: 10:56 Temperature (F): 97.8 Height (in): 62 Pulse (bpm): 69 Weight (lbs): 220 Respiratory Rate (breaths/min): 18 Body Mass Index (BMI): 40.2 Blood Pressure (mmHg): 156/74 Reference Range: 80 - 120 mg / dl Electronic Signature(s) Signed: 10/11/2023 12:52:25 PM By: Thayer Dallas Entered By: Thayer Dallas on 10/08/2023 10:56:52

## 2023-10-15 ENCOUNTER — Encounter (HOSPITAL_BASED_OUTPATIENT_CLINIC_OR_DEPARTMENT_OTHER): Payer: Medicare PPO | Admitting: Internal Medicine

## 2023-10-15 DIAGNOSIS — E11622 Type 2 diabetes mellitus with other skin ulcer: Secondary | ICD-10-CM | POA: Diagnosis not present

## 2023-10-16 NOTE — Progress Notes (Signed)
ENGRID, VIGGIANO ([id number]) [phone].pdf Page 1 of 7 Visit Report for 10/15/2023 Debridement Details Patient Name: Date of Service: [name], [name] 10/15/2023 11:15 A M Medical Record Number: [id number] Patient Account Number: [id number] Date of Birth/Sex: Treating RN: March 24, 1931 (89 y.o. [name], [name] Primary Care Provider: [name] Other Clinician: Referring Provider: Treating Provider/Extender: [name] in Treatment: 15 Debridement Performed for Assessment: Wound #12 Left,Distal,Anterior Lower Leg Performed By: Physician Maxwell Caul., MD The following information was scribed by: [name] The information was scribed for: Baltazar Najjar Debridement Type: Debridement Severity of Tissue Pre Debridement: Fat layer exposed Level of Consciousness (Pre-procedure): Awake and Alert Pre-procedure Verification/Time Out Yes - 11:20 Taken: Start Time: 11:21 Pain Control: Lidocaine 4% Topical Solution Percent of Wound Bed Debrided: 100% T Area Debrided (cm): otal 0.03 Tissue and other material debrided: Non-Viable, Eschar Level: Non-Viable Tissue Debridement Description: Selective/Open Wound Instrument: Curette Bleeding: None End Time: 11:26 Procedural Pain: 0 Post Procedural Pain: 0 Response to Treatment: Procedure was tolerated well Level of Consciousness (Post- Awake and Alert procedure): Post Debridement Measurements of Total Wound Length: (cm) 0.2 Width: (cm) 0.2 Depth: (cm) 0.2 Volume: (cm) 0.006 Character of Wound/Ulcer Post Debridement: Improved Severity of Tissue Post Debridement: Fat layer exposed Post Procedure Diagnosis Same as Pre-procedure Electronic Signature(s) Signed: 10/15/2023 5:16:35 PM By: [name] RN, BSN Signed: 10/16/2023 10:44:47 AM By: Baltazar Najjar MD Entered By: [name] on 10/15/2023  08:42:28 -------------------------------------------------------------------------------- HPI Details Patient Name: Date of Service: [name] 10/15/2023 11:15 A M Medical Record Number: [id number] Patient Account Number: [id number] Date of Birth/Sex: Treating RN: [birth year] (89 y.o. F) Primary Care Provider: [name] Other Clinician: Referring Provider: Treating Provider/Extender: [name] in Treatment: [address] [place] Reece Agar ([id number]) [id number] Page 2 of 7 History of Present Illness HPI Description: 07/02/2023 Ms. 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 She is not quite sure how the right upper leg wound started. The left lower extremity wound started out as blisters. She has compression stockings but has not been wearing them. She takes Lasix 20 mg daily. She currently denies signs of infection. In office ABIs were 0.57 on the left however dorsalis pedal pulses and posterior tibialis pulses were heard on Doppler. 8/6; patient presents for follow-up. We have been using antibiotic ointment with Hydrofera Blue under Kerlix/Coban to the left lower extremity. Hydrofera Blue to the right lower leg. She came in for nurse visit and wrap change. She reports no issues. She reports that she is scheduled for her ABIs next week. 8/13; patient presents for follow-up. We have been using Hydrofera Blue and Santyl to the left lower extremity under Kerlix/Coban. She has been using Hydrofera Blue to the right upper leg wound. She had ABIs completed yesterday that showed an ABI of 0.91 on the left and 0.92 on the right. She has no issues or complaints today. 8/20; patient presents for  follow-up. We have been using Hydrofera Blue and Santyl to the left lower extremity under 3 layer compression and Hydrofera Blue to the right upper leg wound. The right upper leg wound has healed. She feels that the wrap was too tight with the 3 layer. We will go back down to Kerlix/Coban. Other than that she has  no issues or complaints. She denies signs of infection. Wounds Appear smaller. 8/27; patient presents for follow-up. We have been using Hydrofera Blue and Santyl to the left lower extremity wounds under Kerlix/Coban. She tolerated this well. She has no issues or complaints today. Wounds are smaller. 9/3; patient presents for follow-up. We have been using Hydrofera Blue and Santyl to the left lower extremity wound under Kerlix/Coban. Wound is smaller. 9/10; patient presents for follow-up. We have been using Hydrofera Blue and Santyl to the lower extremity under Kerlix/Coban. Wound appears smaller and well-healing. 9/17 continued improvement we have been using Santyl and Hydrofera Blue under kerlix Coban to the left leg wound largely secondary to chronic lymphedema 9/24; continued improvement in the wound size on the left anterior mid tibia. This is in the setting of significant left greater than right lower leg lymphedema. Initially the patient came in with wounds on both legs however the right leg is healed. The wound on the left leg is smaller. We will go ahead and order bilateral juxta lite stockings today 10/1; still a small open wound on the left anterior lower leg in the setting of chronic venous insufficiency and lymphedema. We have been using Santyl and Hydrofera Blue under Kerlix Coban. Everything is closed on the right leg and she has her own juxta lite which she will just the 30/40 mm compression. 10/15; still a small punched-out area on the left anterior lower leg in the setting of chronic venous insufficiency and lymphedema. I changed her to Iodoflex last week. She is using juxta  lites to the right leg and so far things seem stable here. 10/22; same small punched-out area in the left anterior mid tibia. Setting of chronic venous insufficiency and lymphedema. I changed her to Iodoflex flex last week using juxta lites on the right leg and we are wrapping the left leg. Everything else is healed 10/29; small punched-out area on the left anterior mid tibia once again a nonviable surface. I used Iodoflex for the last 3 weeks. Prior to that [facility]. We still have trouble with the viability of the surface 11/5; still no improvement in the small wound on the left anterior lower leg I have been using Iodoflex for about 4 weeks prior to that Santyl really not able to get this to fully granulate. The wound does not probe to bone 11/12; perhaps some improvement in the left anterior lower leg. I used Santyl on this, then Iodoflex I am now using endoform. Wound surface looks better. Electronic Signature(s) Signed: 10/16/2023 10:44:47 AM By: Baltazar Najjar MD Entered By: Baltazar Najjar on 10/15/2023 08:40:25 -------------------------------------------------------------------------------- Physical Exam Details Patient Name: Date of Service: [name], [name] 10/15/2023 11:15 A M Medical Record Number: [id number] Patient Account Number: [id number] Date of Birth/Sex: Treating RN: 1931-04-12 (89 y.o. F) Primary Care Provider: [name] Other Clinician: Referring Provider: Treating Provider/Extender: [name] in Treatment: 15 Constitutional Sitting or standing Blood Pressure is within target range for patient.. Pulse regular and within target range for patient.Marland Kitchen Respirations regular, non-labored and within target range.. Temperature is normal and within the target range for the patient.Marland Kitchen Appears in no distress. Notes Wound exam; left anterior mid tibia surrounding hemosiderin deposition but no evidence of infection. Eschar perhaps some dry  dressing in the wound removed with a #3 curette granulation looked reasonable. CAYA, HOFMANN ([id number]) [phone].pdf Page 3 of 7 Electronic Signature(s) Signed: 10/16/2023 10:44:47 AM By: Baltazar Najjar MD Entered By: Baltazar Najjar on 10/15/2023  08:42:49 -------------------------------------------------------------------------------- Physician Orders Details Patient Name: Date of Service: [name], [name] 10/15/2023 11:15 A M Medical Record Number: [id number] Patient Account Number: [id number] Date of Birth/Sex: Treating RN: Dec 27, 1930 (89 y.o. [name], [name] Primary Care Provider: [name] Other Clinician: Referring Provider: Treating Provider/Extender: [name] in Treatment: 15 The following information was scribed by: [name] The information was scribed for: Baltazar Najjar Verbal / Phone Orders: No Diagnosis Coding ICD-10 Coding Code Description [phone] Non-pressure chronic ulcer of other part of left lower leg with fat layer exposed I89.0 Lymphedema, not elsewhere classified E11.622 Type 2 diabetes mellitus with other skin ulcer I50.42 Chronic combined systolic (congestive) and diastolic (congestive) heart failure Follow-up Appointments ppointment in 1 week. - 10/22/2023 1115 Dr. Leanord Hawking Tuesday Return A Return appointment in 3 weeks. - Dr. Leanord Hawking first week in December Nurse Visit: - 10/29/2023 Tuesday Other: - [employer] - call about your compression stockings. Anesthetic (In clinic) Topical Lidocaine 4% applied to wound bed Bathing/ Shower/ Hygiene May shower with protection but do not get wound dressing(s) wet. Protect dressing(s) with water repellant cover (for example, large plastic bag) or a cast cover and may then take shower. Additional Orders / Instructions Follow Nutritious Diet - increase protein Juven Shake 1-2 times daily. Wound Treatment Wound #12 - Lower Leg  Wound Laterality: Left, Anterior, Distal Cleanser: Soap and Water 1 x Per Week/30 Days Discharge Instructions: May shower and wash wound with dial antibacterial soap and water prior to dressing change. Cleanser: Vashe 5.8 (oz) 1 x Per Week/30 Days Discharge Instructions: Cleanse the wound with Vashe prior to applying a clean dressing using gauze sponges, not tissue or cotton balls. Peri-Wound Care: Sween Lotion (Moisturizing lotion) 1 x Per Week/30 Days Discharge Instructions: Apply moisturizing lotion as directed Prim Dressing: Endoform 2x2 in 1 x Per Week/30 Days ary Discharge Instructions: Moisten with saline Secondary Dressing: ABD Pad, 8x10 1 x Per Week/30 Days Discharge Instructions: Apply over primary dressing as directed. Secondary Dressing: Woven Gauze Sponge, Non-Sterile 4x4 in 1 x Per Week/30 Days Discharge Instructions: Apply over primary dressing as directed. Compression Wrap: Kerlix Roll 4.5x3.1 (in/yd) 1 x Per Week/30 Days Discharge Instructions: Apply Kerlix and Coban compression as directed. Compression Wrap: Coban Self-Adherent Wrap 4x5 (in/yd) 1 x Per Week/30 Days Discharge Instructions: Apply over Kerlix as directed. [name], [name] ([id number]) [phone].pdf Page 4 of 7 Electronic Signature(s) Signed: 10/15/2023 5:16:35 PM By: [name] RN, BSN Signed: 10/16/2023 10:44:47 AM By: Baltazar Najjar MD Entered By: [name] on 10/15/2023 08:19:13 -------------------------------------------------------------------------------- Problem List Details Patient Name: Date of Service: KATARA, DETZEL 10/15/2023 11:15 A M Medical Record Number: [id number] Patient Account Number: [id number] Date of Birth/Sex: Treating RN: Feb 14, 1931 (89 y.o. [name], [name] Primary Care Provider: [name] Other Clinician: Referring Provider: Treating Provider/Extender: [name] in Treatment: 15 Active  Problems ICD-10 Encounter Code Description Active Date MDM Diagnosis [phone] Non-pressure chronic ulcer of other part of left lower leg with fat layer exposed7/30/2024 No Yes I89.0 Lymphedema, not elsewhere classified 07/02/2023 No Yes E11.622 Type 2 diabetes mellitus with other skin ulcer 07/02/2023 No Yes I50.42 Chronic combined systolic (congestive) and diastolic (congestive) heart failure 07/02/2023 No Yes Inactive Problems ICD-10 Code Description Active Date Inactive Date L97.812 Non-pressure chronic ulcer of other part of right lower leg with fat layer exposed 07/02/2023 07/02/2023 Resolved Problems Electronic Signature(s) Signed: 10/16/2023 10:44:47 AM By: Baltazar Najjar MD Entered By: Baltazar Najjar on 10/15/2023  08:39:30 -------------------------------------------------------------------------------- Progress Note Details Patient Name: Date of Service: [name], [name] 10/15/2023 11:15 A M Medical Record Number: [id number] Patient Account Number: [id number] Date of Birth/Sex: Treating RN: [birth year] (89 y.o. Karle Willenbring, Sri G ([id number]) [id number] Page 5 of 7 Primary Care Provider: [name] Other Clinician: Referring Provider: Treating Provider/Extender: [name] in Treatment: 15 Subjective History of Present Illness (HPI) 07/02/2023 Ms. 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 She is not quite sure how the right upper leg wound started. The left lower extremity wound started out as blisters. She has compression stockings but has not been wearing them. She takes Lasix 20 mg daily. She currently denies signs of infection. In office ABIs were 0.57 on the left  however dorsalis pedal pulses and posterior tibialis pulses were heard on Doppler. 8/6; patient presents for follow-up. We have been using antibiotic ointment with Hydrofera Blue under Kerlix/Coban to the left lower extremity. Hydrofera Blue to the right lower leg. She came in for nurse visit and wrap change. She reports no issues. She reports that she is scheduled for her ABIs next week. 8/13; patient presents for follow-up. We have been using Hydrofera Blue and Santyl to the left lower extremity under Kerlix/Coban. She has been using Hydrofera Blue to the right upper leg wound. She had ABIs completed yesterday that showed an ABI of 0.91 on the left and 0.92 on the right. She has no issues or complaints today. 8/20; patient presents for follow-up. We have been using Hydrofera Blue and Santyl to the left lower extremity under 3 layer compression and Hydrofera Blue to the right upper leg wound. The right upper leg wound has healed. She feels that the wrap was too tight with the 3 layer. We will go back down to Kerlix/Coban. Other than that she has no issues or complaints. She denies signs of infection. Wounds Appear smaller. 8/27; patient presents for follow-up. We have been using Hydrofera Blue and Santyl to the left lower extremity wounds under Kerlix/Coban. She tolerated this well. She has no issues or complaints today. Wounds are smaller. 9/3; patient presents for follow-up. We have been using Hydrofera Blue and Santyl to the left lower extremity wound under Kerlix/Coban. Wound is smaller. 9/10; patient presents for follow-up. We have been using Hydrofera Blue and Santyl to the lower extremity under Kerlix/Coban. Wound appears smaller and well-healing. 9/17 continued improvement we have been using Santyl and Hydrofera Blue under kerlix Coban to the left leg wound largely secondary to chronic lymphedema 9/24; continued improvement in the wound size on the left anterior mid tibia. This is in the  setting of significant left greater than right lower leg lymphedema. Initially the patient came in with wounds on both legs however the right leg is healed. The wound on the left leg is smaller. We will go ahead and order bilateral juxta lite stockings today 10/1; still a small open wound on the left anterior lower leg in the setting of chronic venous insufficiency and lymphedema. We have been using Santyl and Hydrofera Blue under Kerlix Coban. Everything is closed on the right leg and she has her own juxta lite which she will just the 30/40 mm compression. 10/15; still a small punched-out area on the left anterior lower leg in the  setting of chronic venous insufficiency and lymphedema. I changed her to Iodoflex last week. She is using juxta lites to the right leg and so far things seem stable here. 10/22; same small punched-out area in the left anterior mid tibia. Setting of chronic venous insufficiency and lymphedema. I changed her to Iodoflex flex last week using juxta lites on the right leg and we are wrapping the left leg. Everything else is healed 10/29; small punched-out area on the left anterior mid tibia once again a nonviable surface. I used Iodoflex for the last 3 weeks. Prior to that [facility]. We still have trouble with the viability of the surface 11/5; still no improvement in the small wound on the left anterior lower leg I have been using Iodoflex for about 4 weeks prior to that Santyl really not able to get this to fully granulate. The wound does not probe to bone 11/12; perhaps some improvement in the left anterior lower leg. I used Santyl on this, then Iodoflex I am now using endoform. Wound surface looks better. Objective Constitutional Sitting or standing Blood Pressure is within target range for patient.. Pulse regular and within target range for patient.Marland Kitchen Respirations regular, non-labored and within target range.. Temperature is normal and within the target range for the  patient.Marland Kitchen Appears in no distress. Vitals Time Taken: 10:28 AM, Height: 62 in, Weight: 220 lbs, BMI: 40.2, Temperature: 98.2 F, Pulse: 57 bpm, Respiratory Rate: 18 breaths/min, Blood Pressure: 120/70 mmHg. General Notes: Wound exam; left anterior mid tibia surrounding hemosiderin deposition but no evidence of infection. Eschar perhaps some dry dressing in the wound removed with a #3 curette granulation looked reasonable. Integumentary (Hair, Skin) Wound #12 status is Open. Original cause of wound was Blister. The date acquired was: 06/12/2023. The wound has been in treatment 15 weeks. The wound is located on the [facility] Lower Leg. The wound measures 0.1cm length x 0.1cm width x 0.1cm depth; 0.008cm^2 area and 0.001cm^3 volume. There is no tunneling or undermining noted. There is a none present amount of drainage noted. The wound margin is distinct with the outline attached to the wound base. There is no granulation within the wound bed. There is a large (67-100%) amount of necrotic tissue within the wound bed including Eschar. The periwound skin appearance exhibited: Scarring, Hemosiderin Staining. The periwound skin appearance did not exhibit: Callus, Crepitus, Excoriation, Induration, Rash, Dry/Scaly, Maceration, Atrophie Blanche, Cyanosis, Ecchymosis, Mottled, Pallor, Rubor, Erythema. Periwound temperature was noted as No Abnormality. The periwound has tenderness on palpation. MCKYNLIE, DIPIETRANTONIO ([id number]) [phone].pdf Page 6 of 7 Assessment Active Problems ICD-10 Non-pressure chronic ulcer of other part of left lower leg with fat layer exposed Lymphedema, not elsewhere classified Type 2 diabetes mellitus with other skin ulcer Chronic combined systolic (congestive) and diastolic (congestive) heart failure Procedures Wound #12 Pre-procedure diagnosis of Wound #12 is a Diabetic Wound/Ulcer of the Lower Extremity located on the Left,Distal,Anterior Lower  Leg .Severity of Tissue Pre Debridement is: Fat layer exposed. There was a Selective/Open Wound Non-Viable Tissue Debridement with a total area of 0.03 sq cm performed by Maxwell Caul., MD. With the following instrument(s): Curette to remove Non-Viable tissue/material. Material removed includes Eschar after achieving pain control using Lidocaine 4% T opical Solution. A time out was conducted at 11:20, prior to the start of the procedure. There was no bleeding. The procedure was tolerated well with a pain level of 0 throughout and a pain level of 0 following the procedure. Post Debridement Measurements: 0.2cm length x  0.2cm width x 0.2cm depth; 0.006cm^3 volume. Character of Wound/Ulcer Post Debridement is improved. Severity of Tissue Post Debridement is: Fat layer exposed. Post procedure Diagnosis Wound #12: Same as Pre-Procedure Plan Follow-up Appointments: Return Appointment in 1 week. - 10/22/2023 1115 Dr. Leanord Hawking Tuesday Return appointment in 3 weeks. - Dr. Leanord Hawking first week in December Nurse Visit: - 10/29/2023 Tuesday Other: - [employer] - call about your compression stockings. Anesthetic: (In clinic) Topical Lidocaine 4% applied to wound bed Bathing/ Shower/ Hygiene: May shower with protection but do not get wound dressing(s) wet. Protect dressing(s) with water repellant cover (for example, large plastic bag) or a cast cover and may then take shower. Additional Orders / Instructions: Follow Nutritious Diet - increase protein Juven Shake 1-2 times daily. WOUND #12: - Lower Leg Wound Laterality: Left, Anterior, Distal Cleanser: Soap and Water 1 x Per Week/30 Days Discharge Instructions: May shower and wash wound with dial antibacterial soap and water prior to dressing change. Cleanser: Vashe 5.8 (oz) 1 x Per Week/30 Days Discharge Instructions: Cleanse the wound with Vashe prior to applying a clean dressing using gauze sponges, not tissue or cotton balls. Peri-Wound Care:  Sween Lotion (Moisturizing lotion) 1 x Per Week/30 Days Discharge Instructions: Apply moisturizing lotion as directed Prim Dressing: Endoform 2x2 in 1 x Per Week/30 Days ary Discharge Instructions: Moisten with saline Secondary Dressing: ABD Pad, 8x10 1 x Per Week/30 Days Discharge Instructions: Apply over primary dressing as directed. Secondary Dressing: Woven Gauze Sponge, Non-Sterile 4x4 in 1 x Per Week/30 Days Discharge Instructions: Apply over primary dressing as directed. Com pression Wrap: Kerlix Roll 4.5x3.1 (in/yd) 1 x Per Week/30 Days Discharge Instructions: Apply Kerlix and Coban compression as directed. Com pression Wrap: Coban Self-Adherent Wrap 4x5 (in/yd) 1 x Per Week/30 Days Discharge Instructions: Apply over Kerlix as directed. 1. Endoform to continue. 2. We have already been through Santyl and Iodosorb. 3. Still under kerlix Coban Electronic Signature(s) Signed: 10/16/2023 10:44:47 AM By: Baltazar Najjar MD Entered By: Baltazar Najjar on 10/15/2023 08:44:08 [name] ([id number]) [id number] Page 7 of 7 -------------------------------------------------------------------------------- SuperBill Details Patient Name: Date of Service: [name], [name] 10/15/2023 Medical Record Number: [id number] Patient Account Number: [id number] Date of Birth/Sex: Treating RN: 1930-12-21 (89 y.o. [name], [name] Primary Care Provider: [name] Other Clinician: Referring Provider: Treating Provider/Extender: [name] in Treatment: 15 Diagnosis Coding ICD-10 Codes Code Description [phone] Non-pressure chronic ulcer of other part of left lower leg with fat layer exposed I89.0 Lymphedema, not elsewhere classified E11.622 Type 2 diabetes mellitus with other skin ulcer I50.42 Chronic combined systolic (congestive) and diastolic (congestive) heart failure Facility Procedures : CPT4 Code:  [id number] Description: [id number] - DEBRIDE WOUND 1ST 20 SQ CM OR < ICD-10 Diagnosis Description L97.822 Non-pressure chronic ulcer of other part of left lower leg with fat layer expose E11.622 Type 2 diabetes mellitus with other skin ulcer I89.0 Lymphedema, not  elsewhere classified Modifier: d Quantity: 1 Physician Procedures : CPT4 Code Description Modifier [id number] - WC PHYS DEBR WO ANESTH 20 SQ CM ICD-10 Diagnosis Description L97.822 Non-pressure chronic ulcer of other part of left lower leg with fat layer exposed E11.622 Type 2 diabetes mellitus with other skin ulcer  I89.0 Lymphedema, not elsewhere classified Quantity: 1 Electronic Signature(s) Signed: 10/16/2023 10:44:47 AM By: Baltazar Najjar MD Entered By: Baltazar Najjar on 10/15/2023 08:44:16

## 2023-10-16 NOTE — Progress Notes (Signed)
TAYIA, STEIDINGER ([id number]) [phone].pdf Page 1 of 8 Visit Report for 10/15/2023 Arrival Information Details Patient Name: Date of Service: [name], [name] 10/15/2023 11:15 A M Medical Record Number: [id number] Patient Account Number: [id number] Date of Birth/Sex: Treating RN: 1931/03/12 (89 y.o. F) Primary Care Dhana Totton: [name] Other Clinician: Referring Kollins Fenter: Treating Elex Mainwaring/Extender: [name] in Treatment: 15 Visit Information History Since Last Visit Added or deleted any medications: No Patient Arrived: [name] Any new allergies or adverse reactions: No Arrival Time: 10:28 Had a fall or experienced change in No Accompanied By: family activities of daily living that may affect Transfer Assistance: None risk of falls: Patient Identification Verified: Yes Signs or symptoms of abuse/neglect since last visito No Secondary Verification Process Completed: Yes Hospitalized since last visit: No Patient Requires Transmission-Based Precautions: No Implantable device outside of the clinic excluding No Patient Has Alerts: Yes cellular tissue based products placed in the center Patient Alerts: ABIs: R:0.92 L:0.91 8/24 since last visit: Pain Present Now: No Electronic Signature(s) Signed: 10/15/2023 11:08:18 AM By: Dayton Scrape Entered By: Dayton Scrape on 10/15/2023 07:28:23 -------------------------------------------------------------------------------- Encounter Discharge Information Details Patient Name: Date of Service: [name], [name] 10/15/2023 11:15 A M Medical Record Number: [id number] Patient Account Number: [id number] Date of Birth/Sex: Treating RN: June 03, 1931 (89 y.o. [name] Primary Care Antwanette Wesche: [name] Other Clinician: Referring Filippa Yarbough: Treating Louie Flenner/Extender: [name] in Treatment: 15 Encounter Discharge Information Items Post  Procedure Vitals Discharge Condition: Stable Temperature (F): 98.2 Ambulatory Status: Walker Pulse (bpm): 57 Discharge Destination: Home Respiratory Rate (breaths/min): 18 Transportation: Private Auto Blood Pressure (mmHg): 120/70 Accompanied By: friend Schedule Follow-up Appointment: Yes Clinical Summary of Care: Electronic Signature(s) Signed: 10/15/2023 5:16:35 PM By: [name] RN, BSN Entered By: [name] on 10/15/2023 08:44:00 [name] ([id number]) [id number] Page 2 of 8 -------------------------------------------------------------------------------- Lower Extremity Assessment Details Patient Name: Date of Service: [name], [name] 10/15/2023 11:15 A M Medical Record Number: [id number] Patient Account Number: [id number] Date of Birth/Sex: Treating RN: 1931-07-01 (89 y.o. [name] Primary Care Allysen Lazo: [name] Other Clinician: Referring Thadius Smisek: Treating Shavonte Zhao/Extender: [name] in Treatment: 15 Edema Assessment Assessed: Kyra Searles: Yes] Franne Forts: No] Edema: [Left: Ye] [Right: s] Calf Left: Right: Point of Measurement: 32 cm From Medial Instep 40 cm Ankle Left: Right: Point of Measurement: 11 cm From Medial Instep 21.5 cm Vascular Assessment Pulses: Dorsalis Pedis Palpable: [Left:Yes] Extremity colors, hair growth, and conditions: Extremity Color: [Left:Normal] Hair Growth on Extremity: [Left:Yes] Temperature of Extremity: [Left:Hot] Capillary Refill: [Left:< 3 seconds] Dependent Rubor: [Left:No] Blanched when Elevated: [Left:No No] Toe Nail Assessment Left: Right: Thick: No Discolored: No Deformed: No Improper Length and Hygiene: No Electronic Signature(s) Signed: 10/15/2023 5:16:35 PM By: [name] RN, BSN Entered By: [name] on 10/15/2023 07:41:12 -------------------------------------------------------------------------------- Multi Wound Chart  Details Patient Name: Date of Service: [name] 10/15/2023 11:15 A M Medical Record Number: [id number] Patient Account Number: [id number] Date of Birth/Sex: Treating RN: [birth year] (89 y.o. F) Primary Care Quinnie Barcelo: [name] Other Clinician: Referring Shamarcus Hoheisel: Treating Shamarcus Hoheisel/Extender: [name] in Treatment: 15 Vital Signs Height(in): 62 Pulse(bpm): 57 Weight(lbs): 220 Blood Pressure(mmHg): 120/70 ASANTE, VINOKUR ([id number]) [phone].pdf Page 3 of 8 Body Mass Index(BMI): 40.2 Temperature(F): 98.2 Respiratory Rate(breaths/min): 18 [12:Photos:] [N/A:N/A] Left, Distal, Anterior Lower Leg N/A N/A Wound Location: Blister N/A N/A Wounding Event: 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 Lymphedema  N/A N/A Secondary Etiology: Anemia, Congestive Heart Failure, N/A N/A Comorbid History: Hypertension, Peripheral Venous Disease, Type II Diabetes, Osteoarthritis, Neuropathy 06/12/2023 N/A N/A Date Acquired: 15 N/A N/A Weeks of Treatment: Open N/A N/A Wound Status: No N/A N/A Wound Recurrence: 0.1x0.1x0.1 N/A N/A Measurements L x W x D (cm) 0.008 N/A N/A A (cm) : rea 0.001 N/A N/A Volume (cm) : 99.80% N/A N/A % Reduction in A rea: 99.70% N/A N/A % Reduction in Volume: Grade 1 N/A N/A Classification: None Present N/A N/A Exudate A mount: Distinct, outline attached N/A N/A Wound Margin: None Present (0%) N/A N/A Granulation A mount: Large (67-100%) N/A N/A Necrotic A mount: Eschar N/A N/A Necrotic Tissue: Fascia: No N/A N/A Exposed Structures: Fat Layer (Subcutaneous Tissue): No Tendon: No Muscle: No Joint: No Bone: No Large (67-100%) N/A N/A Epithelialization: Scarring: Yes N/A N/A Periwound Skin Texture: Excoriation: No Induration: No Callus: No Crepitus: No Rash: No Maceration: No N/A N/A Periwound Skin Moisture: Dry/Scaly:  No Hemosiderin Staining: Yes N/A N/A Periwound Skin Color: Atrophie Blanche: No Cyanosis: No Ecchymosis: No Erythema: No Mottled: No Pallor: No Rubor: No No Abnormality N/A N/A Temperature: Yes N/A N/A Tenderness on Palpation: Treatment Notes Electronic Signature(s) Signed: 10/16/2023 10:44:47 AM By: Baltazar Najjar MD Entered By: Baltazar Najjar on 10/15/2023 08:39:40 Valentino Saxon, [name] ([id number]) [id number] Page 4 of 8 -------------------------------------------------------------------------------- Multi-Disciplinary Care Plan Details Patient Name: Date of Service: [name], [name] 10/15/2023 11:15 A M Medical Record Number: [id number] Patient Account Number: [id number] Date of Birth/Sex: Treating RN: [birth year] (89 y.o. [name], [name] Primary Care Mirakle Tomlin: [name] Other Clinician: Referring Keevin Panebianco: Treating Saima Monterroso/Extender: [name] in Treatment: 15 Active Inactive Nutrition Nursing Diagnoses: Potential for alteratiion in Nutrition/Potential for imbalanced nutrition Goals: Patient/caregiver agrees to and verbalizes understanding of need to obtain nutritional consultation Date Initiated: 07/02/2023 Date Inactivated: 07/30/2023 Target Resolution Date: 08/02/2023 Goal Status: Met Patient/caregiver will maintain therapeutic glucose control Date Initiated: 07/02/2023 Target Resolution Date: 11/01/2023 Goal Status: Active Interventions: Assess HgA1c results as ordered upon admission and as needed Provide education on elevated blood sugars and impact on wound healing Provide education on nutrition Treatment Activities: Patient referred to Primary Care Physician for further nutritional evaluation : 07/02/2023 Notes: Pain, Acute or Chronic Nursing Diagnoses: Pain, acute or chronic: actual or potential Potential alteration in comfort, pain Goals: Patient will verbalize adequate pain control and  receive pain control interventions during procedures as needed Date Initiated: 07/02/2023 Date Inactivated: 07/30/2023 Target Resolution Date: 08/02/2023 Goal Status: Met Patient/caregiver will verbalize comfort level met Date Initiated: 07/02/2023 Target Resolution Date: 11/01/2023 Goal Status: Active Interventions: Encourage patient to take pain medications as prescribed Provide education on pain management Reposition patient for comfort Treatment Activities: Administer pain control measures as ordered : 07/02/2023 Notes: Wound/Skin Impairment Nursing Diagnoses: Knowledge deficit related to ulceration/compromised skin integrity Goals: Patient/caregiver will verbalize understanding of skin care regimen Date Initiated: 07/02/2023 Target Resolution Date: 10/31/2023 Goal Status: Active Interventions: Assess patient/caregiver ability to perform ulcer/skin care regimen upon admission and as needed Assess ulceration(s) every visit Provide education on ulcer and skin care Treatment Activities: Skin care regimen initiated : 07/02/2023 JOMARY, RUFFING ([id number]) [phone].pdf Page 5 of 8 Topical wound management initiated : 07/02/2023 Notes: Electronic Signature(s) Signed: 10/15/2023 5:16:35 PM By: [name] RN, BSN Entered By: [name] on 10/15/2023 07:42:51 -------------------------------------------------------------------------------- Pain Assessment Details Patient Name: Date of Service: [name] 10/15/2023 11:15 A M Medical Record Number: [id number] Patient Account Number: [id number] Date  of Birth/Sex: Treating RN: [birth year] (89 y.o. F) Primary Care Damyn Weitzel: [name] Other Clinician: Referring Marrell Dicaprio: Treating Janie Capp/Extender: [name] in Treatment: 15 Active Problems Location of Pain Severity and Description of Pain Patient Has Paino No Site Locations Pain Management and  Medication Current Pain Management: Electronic Signature(s) Signed: 10/15/2023 11:08:18 AM By: Dayton Scrape Entered By: Dayton Scrape on 10/15/2023 07:28:53 -------------------------------------------------------------------------------- Patient/Caregiver Education Details Patient Name: Date of Service: [name] 11/12/2024andnbsp11:15 A M Medical Record Number: [id number] Patient Account Number: [id number] Date of Birth/Gender: Treating RN: 24-Sep-1931 (89 y.o. [name], Yvonne Kendall Primary Care Physician: [name] Other Clinician: Referring Physician: Treating Physician/Extender: [name] in Treatment: [address] [place], [name] ([id number]) [id number] Page 6 of 8 Education Provided To: Patient Education Topics Provided Wound/Skin Impairment: Handouts: Caring for Your Ulcer Methods: Explain/Verbal Responses: Reinforcements needed Electronic Signature(s) Signed: 10/15/2023 5:16:35 PM By: [name] RN, BSN Entered By: [name] on 10/15/2023 07:43:01 -------------------------------------------------------------------------------- Wound Assessment Details Patient Name: Date of Service: [name] 10/15/2023 11:15 A M Medical Record Number: [id number] Patient Account Number: [id number] Date of Birth/Sex: Treating RN: 1931-05-16 (89 y.o. F) Primary Care Maleeya Peterkin: [name] Other Clinician: Referring Bryan Omura: Treating Graylen Noboa/Extender: [name] in Treatment: 15 Wound Status Wound Number: 12 Primary Diabetic Wound/Ulcer of the Lower Extremity Etiology: Wound Location: Left, Distal, Anterior Lower Leg Secondary Lymphedema Wounding Event: Blister Etiology: Date Acquired: 06/12/2023 Wound Open Weeks Of Treatment: 15 Status: Clustered Wound: No Comorbid Anemia, Congestive Heart Failure, Hypertension, Peripheral History: Venous  Disease, Type II Diabetes, Osteoarthritis, Neuropathy Photos Wound Measurements Length: (cm) 0.1 Width: (cm) 0.1 Depth: (cm) 0.1 Area: (cm) 0.008 Volume: (cm) 0.001 % Reduction in Area: 99.8% % Reduction in Volume: 99.7% Epithelialization: Large (67-100%) Tunneling: No Undermining: No Wound Description Classification: Grade 1 Wound Margin: Distinct, outline attached Exudate Amount: None Present Foul Odor After Cleansing: No Slough/Fibrino Yes Wound Bed Granulation Amount: None Present (0%) Exposed Structure Necrotic Amount: Large (67-100%) Fascia Exposed: No Necrotic Quality: Eschar Fat Layer (Subcutaneous Tissue) Exposed: No Tendon Exposed: No Capelle, Elie G ([id number]) [phone].pdf Page 7 of 8 Muscle Exposed: No Joint Exposed: No Bone Exposed: No Periwound Skin Texture Texture Color No Abnormalities Noted: No No Abnormalities Noted: No Callus: No Atrophie Blanche: No Crepitus: No Cyanosis: No Excoriation: No Ecchymosis: No Induration: No Erythema: No Rash: No Hemosiderin Staining: Yes Scarring: Yes Mottled: No Pallor: No Moisture Rubor: No No Abnormalities Noted: No Dry / Scaly: No Temperature / Pain Maceration: No Temperature: No Abnormality Tenderness on Palpation: Yes Treatment Notes Wound #12 (Lower Leg) Wound Laterality: Left, Anterior, Distal Cleanser Soap and Water Discharge Instruction: May shower and wash wound with dial antibacterial soap and water prior to dressing change. Vashe 5.8 (oz) Discharge Instruction: Cleanse the wound with Vashe prior to applying a clean dressing using gauze sponges, not tissue or cotton balls. Peri-Wound Care Sween Lotion (Moisturizing lotion) Discharge Instruction: Apply moisturizing lotion as directed Topical Primary Dressing Endoform 2x2 in Discharge Instruction: Moisten with saline Secondary Dressing ABD Pad, 8x10 Discharge Instruction: Apply over primary dressing as  directed. Woven Gauze Sponge, Non-Sterile 4x4 in Discharge Instruction: Apply over primary dressing as directed. Secured With Compression Wrap Kerlix Roll 4.5x3.1 (in/yd) Discharge Instruction: Apply Kerlix and Coban compression as directed. Coban Self-Adherent Wrap 4x5 (in/yd) Discharge Instruction: Apply over Kerlix as directed. Compression Stockings Add-Ons Electronic Signature(s) Signed: 10/15/2023 5:16:35 PM By: [name] RN, BSN Entered By:  [name] on 10/15/2023 08:17:59 -------------------------------------------------------------------------------- Vitals Details Patient Name: Date of Service: [name], [name] 10/15/2023 11:15 A M Medical Record Number: [id number] Patient Account Number: [id number] Date of Birth/Sex: Treating RN: 03-Oct-1931 (89 y.o. Meztli Narine, Tavia G ([id number]) [id number] Page 8 of 8 Primary Care Riyah Bardon: [name] Other Clinician: Referring Negan Grudzien: Treating Jammie Troup/Extender: [name] in Treatment: 15 Vital Signs Time Taken: 10:28 Temperature (F): 98.2 Height (in): 62 Pulse (bpm): 57 Weight (lbs): 220 Respiratory Rate (breaths/min): 18 Body Mass Index (BMI): 40.2 Blood Pressure (mmHg): 120/70 Reference Range: 80 - 120 mg / dl Electronic Signature(s) Signed: 10/15/2023 11:08:18 AM By: Dayton Scrape Entered By: Dayton Scrape on 10/15/2023 07:28:46

## 2023-10-22 ENCOUNTER — Encounter (HOSPITAL_BASED_OUTPATIENT_CLINIC_OR_DEPARTMENT_OTHER): Payer: Medicare PPO | Admitting: Internal Medicine

## 2023-10-22 DIAGNOSIS — E11622 Type 2 diabetes mellitus with other skin ulcer: Secondary | ICD-10-CM | POA: Diagnosis not present

## 2023-10-22 NOTE — Progress Notes (Signed)
[name] ([id number]) [id number] Page 1 of 6 Visit Report for 10/22/2023 HPI Details Patient Name: Date of Service: [name] 10/22/2023 11:15 A M Medical Record Number: [id number] Patient Account Number: [id number] Date of Birth/Sex: Treating RN: [birth year] (89 y.o. F) Primary Care Provider: [name] Other Clinician: Referring Provider: Treating Provider/Extender: [name] in Treatment: 16 History of Present Illness HPI Description: 07/02/2023 Ms. 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 She is not quite sure how the right upper leg wound started. The left lower extremity wound started out as blisters. She has compression stockings but has not been wearing them. She takes Lasix 20 mg daily. She currently denies signs of infection. In office ABIs were 0.57 on the left however dorsalis pedal pulses and posterior tibialis pulses were heard on Doppler. 8/6; patient presents for follow-up. We have been using antibiotic ointment with Hydrofera Blue under Kerlix/Coban to the left lower extremity. Hydrofera Blue to the right lower leg. She came in for nurse visit and wrap change. She reports no issues. She reports that she is scheduled for her ABIs next week. 8/13; patient presents for follow-up. We have been using Hydrofera Blue and Santyl to the left lower extremity under Kerlix/Coban. She has been using Hydrofera Blue to the right upper leg wound. She had ABIs completed yesterday that showed an ABI of 0.91 on the left and 0.92 on the right. She has no issues or complaints today. 8/20; patient presents for follow-up. We have been using Hydrofera Blue and Santyl to the left  lower extremity under 3 layer compression and Hydrofera Blue to the right upper leg wound. The right upper leg wound has healed. She feels that the wrap was too tight with the 3 layer. We will go back down to Kerlix/Coban. Other than that she has no issues or complaints. She denies signs of infection. Wounds Appear smaller. 8/27; patient presents for follow-up. We have been using Hydrofera Blue and Santyl to the left lower extremity wounds under Kerlix/Coban. She tolerated this well. She has no issues or complaints today. Wounds are smaller. 9/3; patient presents for follow-up. We have been using Hydrofera Blue and Santyl to the left lower extremity wound under Kerlix/Coban. Wound is smaller. 9/10; patient presents for follow-up. We have been using Hydrofera Blue and Santyl to the lower extremity under Kerlix/Coban. Wound appears smaller and well-healing. 9/17 continued improvement we have been using Santyl and Hydrofera Blue under kerlix Coban to the left leg wound largely secondary to chronic lymphedema 9/24; continued improvement in the wound size on the left anterior mid tibia. This is in the setting of significant left greater than right lower leg lymphedema. Initially the patient came in with wounds on both legs however the right leg is healed. The wound on the left leg is smaller. We will go ahead and order bilateral juxta lite stockings today 10/1; still a small open wound on the left anterior lower leg in the setting of chronic venous insufficiency and lymphedema. We have been using Santyl and Hydrofera Blue under Kerlix Coban. Everything is closed on the right leg and she has her own juxta lite which she will just the 30/40 mm compression. 10/15; still a small punched-out area on the left anterior lower leg in the  setting of chronic venous insufficiency and lymphedema. I changed her to Iodoflex last week. She is using juxta lites to the right leg and so far things seem stable here. 10/22;  same small punched-out area in the left anterior mid tibia. Setting of chronic venous insufficiency and lymphedema. I changed her to Iodoflex flex last week using juxta lites on the right leg and we are wrapping the left leg. Everything else is healed 10/29; small punched-out area on the left anterior mid tibia once again a nonviable surface. I used Iodoflex for the last 3 weeks. Prior to that [facility]. We still have trouble with the viability of the surface 11/5; still no improvement in the small wound on the left anterior lower leg I have been using Iodoflex for about 4 weeks prior to that Santyl really not able to get this to fully granulate. The wound does not probe to bone 11/12; perhaps some improvement in the left anterior lower leg. I used Santyl on this, then Iodoflex I am now using endoform. Wound surface looks better. 11/19; small open area has epithelialized over albeit in a divot in her skin. They have a JuxtaLite stocking Electronic Signature(s) Signed: 10/22/2023 4:19:19 PM By: Baltazar Najjar MD Entered By: Baltazar Najjar on 10/22/2023 08:20:55 [name] ([id number]) [id number] Page 2 of 6 -------------------------------------------------------------------------------- Physical Exam Details Patient Name: Date of Service: [name], [name] 10/22/2023 11:15 A M Medical Record Number: [id number] Patient Account Number: [id number] Date of Birth/Sex: Treating RN: [birth year] (89 y.o. F) Primary Care Provider: [name] Other Clinician: Referring Provider: Treating Provider/Extender: [name] in Treatment: 16 Constitutional Sitting or standing Blood Pressure is within target range for patient.. Pulse regular and within target range for patient.Marland Kitchen Respirations regular, non-labored and within target range.. Temperature is normal and within the target range for the patient.Marland Kitchen Appears in no  distress. Notes Wound exam; left anterior lower leg the small area is examined under illumination. There is a layer of skin over the top of this albeit in a divot. There is no evidence of surrounding infection Electronic Signature(s) Signed: 10/22/2023 4:19:19 PM By: Baltazar Najjar MD Entered By: Baltazar Najjar on 10/22/2023 08:21:51 -------------------------------------------------------------------------------- Physician Orders Details Patient Name: Date of Service: [name] 10/22/2023 11:15 A M Medical Record Number: [id number] Patient Account Number: [id number] Date of Birth/Sex: Treating RN: 1931-03-30 (89 y.o. [name] Primary Care Provider: [name] Other Clinician: Referring Provider: Treating Provider/Extender: [name] in Treatment: 16 The following information was scribed by: [name] The information was scribed for: Baltazar Najjar Verbal / Phone Orders: No Diagnosis Coding ICD-10 Coding Code Description [phone] Non-pressure chronic ulcer of other part of left lower leg with fat layer exposed I89.0 Lymphedema, not elsewhere classified E11.622 Type 2 diabetes mellitus with other skin ulcer I50.42 Chronic combined systolic (congestive) and diastolic (congestive) heart failure Follow-up Appointments Return appointment in 1 month. - Dr. Leanord Hawking (Front office to schedule) Compression stockings WEAR for LIFE! Keep area covered with a bandaid for protection. Edema Control - Orders / Instructions Elevate legs to the level of the heart or above for 30 minutes daily and/or when sitting for 3-4 times a day throughout the day. Avoid standing for long periods of time. Patient to wear own compression stockings every day. Exercise regularly Moisturize legs daily. - every night before bed. Compression stocking or Garment 20-30 mm/Hg pressure to: - juxtalite HD apply in the morning and remove at night. Electronic  Signature(s) Signed: 10/22/2023 4:19:19 PM By: Baltazar Najjar MD Signed: 10/22/2023 4:47:01 PM By: [name] RN, BSN Entered By: [name] on 10/22/2023 08:16:04 [name] ([id number]) [id number] Page 3 of 6 -------------------------------------------------------------------------------- Problem List Details Patient Name: Date of Service: [name], [name] 10/22/2023 11:15 A M Medical Record Number: [id number] Patient Account Number: [id number] Date of Birth/Sex: Treating RN: 04-27-1931 (89 y.o. [name], [name] Primary Care Provider: [name] Other Clinician: Referring Provider: Treating Provider/Extender: [name] in Treatment: 16 Active Problems ICD-10 Encounter Code Description Active Date MDM Diagnosis [phone] Non-pressure chronic ulcer of other part of left lower leg with fat layer exposed7/30/2024 No Yes I89.0 Lymphedema, not elsewhere classified 07/02/2023 No Yes E11.622 Type 2 diabetes mellitus with other skin ulcer 07/02/2023 No Yes I50.42 Chronic combined systolic (congestive) and diastolic (congestive) heart failure 07/02/2023 No Yes Inactive Problems ICD-10 Code Description Active Date Inactive Date L97.812 Non-pressure chronic ulcer of other part of right lower leg with fat layer exposed 07/02/2023 07/02/2023 Resolved Problems Electronic Signature(s) Signed: 10/22/2023 4:19:19 PM By: Baltazar Najjar MD Entered By: Baltazar Najjar on 10/22/2023 08:19:54 -------------------------------------------------------------------------------- Progress Note Details Patient Name: Date of Service: [name] 10/22/2023 11:15 A M Medical Record Number: [id number] Patient Account Number: [id number] Date of Birth/Sex: Treating RN: [birth year] (89 y.o. F) Primary Care Provider: [name] Other Clinician: Referring Provider: Treating Provider/Extender: [name] in Treatment: 16 Subjective History of Present Illness (HPI) [name], [name] ([id number]) [id number] Page 4 of 6 07/02/2023 Ms. 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 She is not quite sure how the right upper leg wound started. The left lower extremity wound started out as blisters. She has compression stockings but has not been wearing them. She takes Lasix 20 mg daily. She currently denies signs of infection. In office ABIs were 0.57 on the left however dorsalis pedal pulses and posterior tibialis pulses were heard on Doppler. 8/6; patient presents for follow-up. We have been using antibiotic ointment with Hydrofera Blue under Kerlix/Coban to the left lower extremity. Hydrofera Blue to the right lower leg. She came in for nurse visit and wrap change. She reports no issues. She reports that she is scheduled for her ABIs next week. 8/13; patient presents for follow-up. We have been using Hydrofera Blue and Santyl to the left lower extremity under Kerlix/Coban. She has been using Hydrofera Blue to the right upper leg wound. She had ABIs completed yesterday that showed an ABI of 0.91 on the left and 0.92 on the right. She has no issues or complaints today. 8/20; patient presents for follow-up. We have been using Hydrofera Blue and Santyl to the left lower extremity under 3 layer compression and Hydrofera Blue to the right upper leg wound. The right upper leg wound has healed. She feels that the wrap was too tight with the 3 layer. We will go back down to Kerlix/Coban. Other than that she has no issues or complaints. She denies signs of infection. Wounds Appear smaller. 8/27; patient presents for follow-up. We have  been using Hydrofera Blue and Santyl to the left lower extremity wounds under Kerlix/Coban. She tolerated this well. She has no issues or complaints today. Wounds are smaller. 9/3; patient presents for follow-up. We have been  using Hydrofera Blue and Santyl to the left lower extremity wound under Kerlix/Coban. Wound is smaller. 9/10; patient presents for follow-up. We have been using Hydrofera Blue and Santyl to the lower extremity under Kerlix/Coban. Wound appears smaller and well-healing. 9/17 continued improvement we have been using Santyl and Hydrofera Blue under kerlix Coban to the left leg wound largely secondary to chronic lymphedema 9/24; continued improvement in the wound size on the left anterior mid tibia. This is in the setting of significant left greater than right lower leg lymphedema. Initially the patient came in with wounds on both legs however the right leg is healed. The wound on the left leg is smaller. We will go ahead and order bilateral juxta lite stockings today 10/1; still a small open wound on the left anterior lower leg in the setting of chronic venous insufficiency and lymphedema. We have been using Santyl and Hydrofera Blue under Kerlix Coban. Everything is closed on the right leg and she has her own juxta lite which she will just the 30/40 mm compression. 10/15; still a small punched-out area on the left anterior lower leg in the setting of chronic venous insufficiency and lymphedema. I changed her to Iodoflex last week. She is using juxta lites to the right leg and so far things seem stable here. 10/22; same small punched-out area in the left anterior mid tibia. Setting of chronic venous insufficiency and lymphedema. I changed her to Iodoflex flex last week using juxta lites on the right leg and we are wrapping the left leg. Everything else is healed 10/29; small punched-out area on the left anterior mid tibia once again a nonviable surface. I used Iodoflex for the last  3 weeks. Prior to that [facility]. We still have trouble with the viability of the surface 11/5; still no improvement in the small wound on the left anterior lower leg I have been using Iodoflex for about 4 weeks prior to that Santyl really not able to get this to fully granulate. The wound does not probe to bone 11/12; perhaps some improvement in the left anterior lower leg. I used Santyl on this, then Iodoflex I am now using endoform. Wound surface looks better. 11/19; small open area has epithelialized over albeit in a divot in her skin. They have a JuxtaLite stocking Objective Constitutional Sitting or standing Blood Pressure is within target range for patient.. Pulse regular and within target range for patient.Marland Kitchen Respirations regular, non-labored and within target range.. Temperature is normal and within the target range for the patient.Marland Kitchen Appears in no distress. Vitals Time Taken: 10:58 AM, Height: 62 in, Weight: 220 lbs, BMI: 40.2, Temperature: 97.6 F, Pulse: 57 bpm, Respiratory Rate: 17 breaths/min, Blood Pressure: 122/44 mmHg. General Notes: Wound exam; left anterior lower leg the small area is examined under illumination. There is a layer of skin over the top of this albeit in a divot. There is no evidence of surrounding infection Integumentary (Hair, Skin) Wound #12 status is Open. Original cause of wound was Blister. The date acquired was: 06/12/2023. The wound has been in treatment 16 weeks. The wound is located on the [facility] Lower Leg. The wound measures 0cm length x 0cm width x 0cm depth; 0cm^2 area and 0cm^3 volume. There is a none present amount of drainage noted. The wound margin is distinct with the outline attached to the wound base. There is no granulation within the wound bed. There is a large (67-100%) amount of necrotic tissue within the wound bed including Eschar. The periwound  skin appearance exhibited: Scarring, Hemosiderin Staining. The periwound skin appearance  did not exhibit: Callus, Crepitus, Excoriation, Induration, Rash, Dry/Scaly, Maceration, Atrophie Blanche, Cyanosis, Ecchymosis, Mottled, Pallor, Rubor, Erythema. Periwound temperature was noted as No Abnormality. The periwound has tenderness on palpation. Assessment Active Problems ICD-10 Non-pressure chronic ulcer of other part of left lower leg with fat layer exposed [name], [name] ([id number]) [id number] Page 5 of 6 Lymphedema, not elsewhere classified Type 2 diabetes mellitus with other skin ulcer Chronic combined systolic (congestive) and diastolic (congestive) heart failure Plan Follow-up Appointments: Return appointment in 1 month. - Dr. Leanord Hawking (Front office to schedule) Compression stockings WEAR for LIFE! Keep area covered with a bandaid for protection. Edema Control - Orders / Instructions: Elevate legs to the level of the heart or above for 30 minutes daily and/or when sitting for 3-4 times a day throughout the day. Avoid standing for long periods of time. Patient to wear own compression stockings every day. Exercise regularly Moisturize legs daily. - every night before bed. Compression stocking or Garment 20-30 mm/Hg pressure to: - juxtalite HD apply in the morning and remove at night. 1. The small wound area is epithelialized although the epithelialization has occurred within a divot of the skin. This type of outcome generally is less optimal than I would like. Nevertheless I think it is worthwhile to give this some time to declare itself. We will keep this covered with a thick Band-Aid and use her juxta lite 2. I have given her a follow-up in a month to see if this holds together Electronic Signature(s) Signed: 10/22/2023 4:19:19 PM By: Baltazar Najjar MD Entered By: Baltazar Najjar on 10/22/2023 08:23:01 -------------------------------------------------------------------------------- SuperBill Details Patient Name: Date of Service: [name] 10/22/2023 Medical Record Number: [id number] Patient Account Number: [id number] Date of Birth/Sex: Treating RN: 26-Jul-1931 (89 y.o. [name], [name] Primary Care Provider: [name] Other Clinician: Referring Provider: Treating Provider/Extender: [name] in Treatment: 16 Diagnosis Coding ICD-10 Codes Code Description [phone] Non-pressure chronic ulcer of other part of left lower leg with fat layer exposed I89.0 Lymphedema, not elsewhere classified E11.622 Type 2 diabetes mellitus with other skin ulcer I50.42 Chronic combined systolic (congestive) and diastolic (congestive) heart failure Facility Procedures : CPT4 Code: [id number] Description: 99213 - WOUND CARE VISIT-LEV 3 EST PT Modifier: Quantity: 1 Physician Procedures : CPT4 Code Description Modifier [id number] - WC PHYS LEVEL 2 - EST PT ICD-10 Diagnosis Description L97.822 Non-pressure chronic ulcer of other part of left lower leg with fat layer exposed I89.0 Lymphedema, not elsewhere classified Quantity: 1 Electronic Signature(s) Signed: 10/22/2023 4:19:19 PM By: Baltazar Najjar MD Valentino Saxon, Signed: 10/22/2023 4:19:19 PM By: Baltazar Najjar MD [name] ([id number]) [id number] Page 6 of 6 Entered By: Baltazar Najjar on 10/22/2023 08:23:19

## 2023-10-22 NOTE — Progress Notes (Signed)
ALEEA, KEEF ([id number]) [id number] Page 1 of 9 Visit Report for 10/22/2023 Arrival Information Details Patient Name: Date of Service: [name], [name] 10/22/2023 11:15 A M Medical Record Number: [id number] Patient Account Number: [id number] Date of Birth/Sex: Treating RN: 27-Apr-1931 (89 y.o. [name], [name] Primary Care [name]: [name] Other Clinician: Referring [name]: Treating [name]/Extender: [name] in Treatment: 16 Visit Information History Since Last Visit Added or deleted any medications: No Patient Arrived: [name] Any new allergies or adverse reactions: No Arrival Time: 10:58 Had a fall or experienced change in No Accompanied By: daughter activities of daily living that may affect Transfer Assistance: Manual risk of falls: Patient Identification Verified: Yes Signs or symptoms of abuse/neglect since last visito No Secondary Verification Process Completed: Yes Hospitalized since last visit: No Patient Requires Transmission-Based Precautions: No Implantable device outside of the clinic excluding No Patient Has Alerts: Yes cellular tissue based products placed in the center Patient Alerts: ABIs: R:0.92 L:0.91 8/24 since last visit: Has Dressing in Place as Prescribed: Yes Has Compression in Place as Prescribed: Yes Pain Present Now: No Electronic Signature(s) Signed: 10/22/2023 3:54:17 PM By: Katherine Mu RN Entered By: [name] on 10/22/2023 07:58:42 -------------------------------------------------------------------------------- Clinic Level of Care Assessment Details Patient Name: Date of Service: [name] 10/22/2023 11:15 A M Medical Record Number: [id number] Patient Account Number: [id number] Date of Birth/Sex: Treating RN: 05-Aug-1931 (89 y.o. [name] Primary Care [name]: [name] Other Clinician: Referring [name]: Treating  [name]/Extender: [name] in Treatment: 16 Clinic Level of Care Assessment Items TOOL 4 Quantity Score X- 1 0 Use when only an EandM is performed on FOLLOW-UP visit ASSESSMENTS - Nursing Assessment / Reassessment X- 1 10 Reassessment of Co-morbidities (includes updates in patient status) X- 1 5 Reassessment of Adherence to Treatment Plan ASSESSMENTS - Wound and Skin A ssessment / Reassessment X - Simple Wound Assessment / Reassessment - one wound 1 5 []  - 0 Complex Wound Assessment / Reassessment - multiple wounds X- 1 10 Dermatologic / Skin Assessment (not related to wound area) ASSESSMENTS - Focused Assessment X- 1 5 Circumferential Edema Measurements - multi extremities []  - 0 Nutritional Assessment / Counseling / Intervention [name], [name] ([id number]) [id number] Page 2 of 9 []  - 0 Lower Extremity Assessment (monofilament, tuning fork, pulses) []  - 0 Peripheral Arterial Disease Assessment (using hand held doppler) ASSESSMENTS - Ostomy and/or Continence Assessment and Care []  - 0 Incontinence Assessment and Management []  - 0 Ostomy Care Assessment and Management (repouching, etc.) PROCESS - Coordination of Care X - Simple Patient / Family Education for ongoing care 1 15 []  - 0 Complex (extensive) Patient / Family Education for ongoing care X- 1 10 Staff obtains Chiropractor, Records, T Results / Process Orders est []  - 0 Staff telephones HHA, Nursing Homes / Clarify orders / etc []  - 0 Routine Transfer to another Facility (non-emergent condition) []  - 0 Routine Hospital Admission (non-emergent condition) []  - 0 New Admissions / Manufacturing engineer / Ordering NPWT Apligraf, etc. , []  - 0 Emergency Hospital Admission (emergent condition) X- 1 10 Simple Discharge Coordination []  - 0 Complex (extensive) Discharge Coordination PROCESS - Special Needs []  - 0 Pediatric / Minor Patient Management []   - 0 Isolation Patient Management []  - 0 Hearing / Language / Visual special needs []  - 0 Assessment of Community assistance (transportation, D/C planning, etc.) []  - 0 Additional assistance / Altered mentation []  - 0 Support Surface(s)  Assessment (bed, cushion, seat, etc.) INTERVENTIONS - Wound Cleansing / Measurement X - Simple Wound Cleansing - one wound 1 5 []  - 0 Complex Wound Cleansing - multiple wounds X- 1 5 Wound Imaging (photographs - any number of wounds) []  - 0 Wound Tracing (instead of photographs) X- 1 5 Simple Wound Measurement - one wound []  - 0 Complex Wound Measurement - multiple wounds INTERVENTIONS - Wound Dressings X - Small Wound Dressing one or multiple wounds 1 10 []  - 0 Medium Wound Dressing one or multiple wounds []  - 0 Large Wound Dressing one or multiple wounds []  - 0 Application of Medications - topical []  - 0 Application of Medications - injection INTERVENTIONS - Miscellaneous []  - 0 External ear exam []  - 0 Specimen Collection (cultures, biopsies, blood, body fluids, etc.) []  - 0 Specimen(s) / Culture(s) sent or taken to Lab for analysis []  - 0 Patient Transfer (multiple staff / Nurse, adult / Similar devices) []  - 0 Simple Staple / Suture removal (25 or less) []  - 0 Complex Staple / Suture removal (26 or more) []  - 0 Hypo / Hyperglycemic Management (close monitor of Blood Glucose) Shubert, [name] ([id number]) [id number] Page 3 of 9 []  - 0 Ankle / Brachial Index (ABI) - do not check if billed separately X- 1 5 Vital Signs Has the patient been seen at the hospital within the last three years: Yes Total Score: 100 Level Of Care: New/Established - Level 3 Electronic Signature(s) Signed: 10/22/2023 4:47:01 PM By: Katherine Stall RN, BSN Entered By: [name] on 10/22/2023 08:16:32 -------------------------------------------------------------------------------- Encounter Discharge Information  Details Patient Name: Date of Service: [name] 10/22/2023 11:15 A M Medical Record Number: [id number] Patient Account Number: [id number] Date of Birth/Sex: Treating RN: [birth year] (89 y.o. [name] Primary Care [name]: [name] Other Clinician: Referring Jamill Wetmore: Treating Berma Harts/Extender: [name] in Treatment: 16 Encounter Discharge Information Items Discharge Condition: Stable Ambulatory Status: Ambulatory Discharge Destination: Home Transportation: Private Auto Accompanied By: friend Schedule Follow-up Appointment: Yes Clinical Summary of Care: Notes bordered foam and juxtalite HD to left leg. educated how to apply stocking. Electronic Signature(s) Signed: 10/22/2023 4:47:01 PM By: Katherine Stall RN, BSN Entered By: [name] on 10/22/2023 08:17:22 -------------------------------------------------------------------------------- Lower Extremity Assessment Details Patient Name: Date of Service: MCCALL, DEBERG 10/22/2023 11:15 A M Medical Record Number: [id number] Patient Account Number: [id number] Date of Birth/Sex: Treating RN: March 28, 1931 (89 y.o. [name], [name] Primary Care Jhostin Epps: [name] Other Clinician: Referring Maui Ahart: Treating Karel Mowers/Extender: Charlann Lange Weeks in Treatment: 16 Edema Assessment Assessed: Kyra Searles: Yes] Franne Forts: No] Edema: [Left: Ye] [Right: s] Calf Left: Right: Point of Measurement: 32 cm From Medial Instep 40 cm Ankle Left: Right: Point of Measurement: 11 cm From Medial Instep 21.5 cm EMERII, ESCANDON ([id number]) [id number].pdf Page 4 of 9 Vascular Assessment Extremity colors, hair growth, and conditions: Extremity Color: [Left:Normal] Hair Growth on Extremity: [Left:Yes] Temperature of Extremity: [Left:Hot] Capillary Refill: [Left:< 3 seconds] Dependent Rubor: [Left:No No] Electronic  Signature(s) Signed: 10/22/2023 3:54:17 PM By: Katherine Mu RN Entered By: [name] on 10/22/2023 08:04:21 -------------------------------------------------------------------------------- Multi Wound Chart Details Patient Name: Date of Service: [name] 10/22/2023 11:15 A M Medical Record Number: [id number] Patient Account Number: [id number] Date of Birth/Sex: Treating RN: June 29, 1931 (89 y.o. F) Primary Care Amariyon Maynes: [name] Other Clinician: Referring Katilin Raynes: Treating Shadee Montoya/Extender: [name] in Treatment: 16 Vital Signs Height(in): 62 Pulse(bpm): 57 Weight(lbs): 220  Blood Pressure(mmHg): 122/44 Body Mass Index(BMI): 40.2 Temperature(F): 97.6 Respiratory Rate(breaths/min): 17 [12:Photos:] [N/A:N/A] Left, Distal, Anterior Lower Leg N/A N/A Wound Location: Blister N/A N/A Wounding Event: 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 Lymphedema N/A N/A Secondary Etiology: Anemia, Congestive Heart Failure, N/A N/A Comorbid History: Hypertension, Peripheral Venous Disease, Type II Diabetes, Osteoarthritis, Neuropathy 06/12/2023 N/A N/A Date Acquired: 16 N/A N/A Weeks of Treatment: Open N/A N/A Wound Status: No N/A N/A Wound Recurrence: 0x0x0 N/A N/A Measurements L x W x D (cm) 0 N/A N/A A (cm) : rea 0 N/A N/A Volume (cm) : 100.00% N/A N/A % Reduction in A rea: 100.00% N/A N/A % Reduction in Volume: Grade 1 N/A N/A Classification: None Present N/A N/A Exudate A mount: Distinct, outline attached N/A N/A Wound Margin: None Present (0%) N/A N/A Granulation A mount: Large (67-100%) N/A N/A Necrotic A mount: Eschar N/A N/A Necrotic Tissue: Fascia: No N/A N/A Exposed Structures: Fat Layer (Subcutaneous Tissue): No Tendon: No [name], [name] ([id number]) [id number] Page 5 of 9 Muscle: No Joint: No Bone: No Large (67-100%) N/A  N/A Epithelialization: Scarring: Yes N/A N/A Periwound Skin Texture: Excoriation: No Induration: No Callus: No Crepitus: No Rash: No Maceration: No N/A N/A Periwound Skin Moisture: Dry/Scaly: No Hemosiderin Staining: Yes N/A N/A Periwound Skin Color: Atrophie Blanche: No Cyanosis: No Ecchymosis: No Erythema: No Mottled: No Pallor: No Rubor: No No Abnormality N/A N/A Temperature: Yes N/A N/A Tenderness on Palpation: Treatment Notes Electronic Signature(s) Signed: 10/22/2023 4:19:19 PM By: Baltazar Najjar MD Entered By: Baltazar Najjar on 10/22/2023 08:20:02 -------------------------------------------------------------------------------- Multi-Disciplinary Care Plan Details Patient Name: Date of Service: [name] 10/22/2023 11:15 A M Medical Record Number: [id number] Patient Account Number: [id number] Date of Birth/Sex: Treating RN: 03/20/1931 (89 y.o. [name], [name] Primary Care Janica Eldred: [name] Other Clinician: Referring Kolina Kube: Treating Montzerrat Brunell/Extender: [name] in Treatment: 16 Active Inactive Nutrition Nursing Diagnoses: Potential for alteratiion in Nutrition/Potential for imbalanced nutrition Goals: Patient/caregiver agrees to and verbalizes understanding of need to obtain nutritional consultation Date Initiated: 07/02/2023 Date Inactivated: 07/30/2023 Target Resolution Date: 08/02/2023 Goal Status: Met Patient/caregiver will maintain therapeutic glucose control Date Initiated: 07/02/2023 Target Resolution Date: 11/01/2023 Goal Status: Active Interventions: Assess HgA1c results as ordered upon admission and as needed Provide education on elevated blood sugars and impact on wound healing Provide education on nutrition Treatment Activities: Patient referred to Primary Care Physician for further nutritional evaluation : 07/02/2023 Notes: Pain, Acute or Chronic Nursing Diagnoses: Pain, acute or  chronic: actual or potential Potential alteration in comfort, pain [name], [name] ([id number]) [id number].pdf Page 6 of 9 Goals: Patient will verbalize adequate pain control and receive pain control interventions during procedures as needed Date Initiated: 07/02/2023 Date Inactivated: 07/30/2023 Target Resolution Date: 08/02/2023 Goal Status: Met Patient/caregiver will verbalize comfort level met Date Initiated: 07/02/2023 Target Resolution Date: 11/01/2023 Goal Status: Active Interventions: Encourage patient to take pain medications as prescribed Provide education on pain management Reposition patient for comfort Treatment Activities: Administer pain control measures as ordered : 07/02/2023 Notes: Wound/Skin Impairment Nursing Diagnoses: Knowledge deficit related to ulceration/compromised skin integrity Goals: Patient/caregiver will verbalize understanding of skin care regimen Date Initiated: 07/02/2023 Target Resolution Date: 10/31/2023 Goal Status: Active Interventions: Assess patient/caregiver ability to perform ulcer/skin care regimen upon admission and as needed Assess ulceration(s) every visit Provide education on ulcer and skin care Treatment Activities: Skin care regimen initiated : 07/02/2023 Topical wound management initiated : 07/02/2023 Notes: Electronic Signature(s) Signed: 10/22/2023  4:47:01 PM By: Katherine Stall RN, BSN Entered By: [name] on 10/22/2023 08:08:07 -------------------------------------------------------------------------------- Pain Assessment Details Patient Name: Date of Service: [name], [name] 10/22/2023 11:15 A M Medical Record Number: [id number] Patient Account Number: [id number] Date of Birth/Sex: Treating RN: 08/24/1931 (89 y.o. [name], [name] Primary Care Ashlon Lottman: [name] Other Clinician: Referring Tashari Schoenfelder: Treating Satvik Parco/Extender: [name] in  Treatment: 16 Active Problems Location of Pain Severity and Description of Pain Patient Has Paino No Site Locations [place], [place] ([id number]) [id number] Page 7 of 9 Pain Management and Medication Current Pain Management: Electronic Signature(s) Signed: 10/22/2023 3:54:17 PM By: Katherine Mu RN Entered By: [name] on 10/22/2023 08:04:15 -------------------------------------------------------------------------------- Patient/Caregiver Education Details Patient Name: Date of Service: [name] 11/19/2024andnbsp11:15 A M Medical Record Number: [id number] Patient Account Number: [id number] Date of Birth/Gender: Treating RN: 30-Mar-1931 (89 y.o. [name] Primary Care Physician: [name] Other Clinician: Referring Physician: Treating Physician/Extender: [name] in Treatment: 16 Education Assessment Education Provided To: Patient Education Topics Provided Wound/Skin Impairment: Handouts: Caring for Your Ulcer Methods: Explain/Verbal Responses: Reinforcements needed Electronic Signature(s) Signed: 10/22/2023 4:47:01 PM By: Katherine Stall RN, BSN Entered By: [name] on 10/22/2023 08:08:20 -------------------------------------------------------------------------------- Wound Assessment Details Patient Name: Date of Service: [name] 10/22/2023 11:15 A M Medical Record Number: [id number] Patient Account Number: [id number] Date of Birth/Sex: Treating RN: 03/29/1931 (89 y.o. Toniann Fail Primary Care Tynlee Bayle: [name] Other Clinician: AHLEY, [name] ([id number]) [id number] Page 8 of 9 Referring Jamoni Broadfoot: Treating Maleena Eddleman/Extender: [name] in Treatment: 16 Wound Status Wound Number: 12 Primary Diabetic Wound/Ulcer of the Lower Extremity Etiology: Wound Location: Left, Distal,  Anterior Lower Leg Secondary Lymphedema Wounding Event: Blister Etiology: Date Acquired: 06/12/2023 Wound Open Weeks Of Treatment: 16 Status: Clustered Wound: No Comorbid Anemia, Congestive Heart Failure, Hypertension, Peripheral History: Venous Disease, Type II Diabetes, Osteoarthritis, Neuropathy Photos Wound Measurements Length: (cm) Width: (cm) Depth: (cm) Area: (cm) Volume: (cm) 0 % Reduction in Area: 100% 0 % Reduction in Volume: 100% 0 Epithelialization: Large (67-100%) 0 0 Wound Description Classification: Grade 1 Wound Margin: Distinct, outline attached Exudate Amount: None Present Foul Odor After Cleansing: No Slough/Fibrino Yes Wound Bed Granulation Amount: None Present (0%) Exposed Structure Necrotic Amount: Large (67-100%) Fascia Exposed: No Necrotic Quality: Eschar Fat Layer (Subcutaneous Tissue) Exposed: No Tendon Exposed: No Muscle Exposed: No Joint Exposed: No Bone Exposed: No Periwound Skin Texture Texture Color No Abnormalities Noted: No No Abnormalities Noted: No Callus: No Atrophie Blanche: No Crepitus: No Cyanosis: No Excoriation: No Ecchymosis: No Induration: No Erythema: No Rash: No Hemosiderin Staining: Yes Scarring: Yes Mottled: No Pallor: No Moisture Rubor: No No Abnormalities Noted: No Dry / Scaly: No Temperature / Pain Maceration: No Temperature: No Abnormality Tenderness on Palpation: Yes Electronic Signature(s) Signed: 10/22/2023 3:54:17 PM By: Katherine Mu RN Entered By: [name] on 10/22/2023 08:06:21 [name] ([id number]) [id number] Page 9 of 9 -------------------------------------------------------------------------------- Vitals Details Patient Name: Date of Service: [name], [name] 10/22/2023 11:15 A M Medical Record Number: [id number] Patient Account Number: [id number] Date of Birth/Sex: Treating RN: 1931/07/05 (89 y.o. [name], [name] Primary Care  Naara Kelty: [name] Other Clinician: Referring Corlis Angelica: Treating Shandy Checo/Extender: [name] in Treatment: 16 Vital Signs Time Taken: 10:58 Temperature (F): 97.6 Height (in): 62 Pulse (bpm): 57 Weight (lbs): 220 Respiratory Rate (breaths/min): 17 Body Mass Index (BMI): 40.2 Blood Pressure (mmHg): 122/44 Reference  Range: 80 - 120 mg / dl Electronic Signature(s) Signed: 10/22/2023 3:54:17 PM By: Katherine Mu RN Entered By: [name] on 10/22/2023 07:59:19

## 2023-10-29 ENCOUNTER — Encounter (HOSPITAL_BASED_OUTPATIENT_CLINIC_OR_DEPARTMENT_OTHER): Payer: Medicare PPO | Admitting: General Surgery

## 2023-11-11 ENCOUNTER — Ambulatory Visit (INDEPENDENT_AMBULATORY_CARE_PROVIDER_SITE_OTHER): Payer: Medicare PPO | Admitting: Podiatry

## 2023-11-11 ENCOUNTER — Encounter: Payer: Self-pay | Admitting: Podiatry

## 2023-11-11 DIAGNOSIS — M79675 Pain in left toe(s): Secondary | ICD-10-CM | POA: Diagnosis not present

## 2023-11-11 DIAGNOSIS — E1142 Type 2 diabetes mellitus with diabetic polyneuropathy: Secondary | ICD-10-CM | POA: Diagnosis not present

## 2023-11-11 DIAGNOSIS — B351 Tinea unguium: Secondary | ICD-10-CM

## 2023-11-11 DIAGNOSIS — M79674 Pain in right toe(s): Secondary | ICD-10-CM | POA: Diagnosis not present

## 2023-11-11 NOTE — Progress Notes (Signed)
This patient returns to my office for at risk foot care.  This patient requires this care by a professional since this patient will be at risk due to having diabetes.   This patient is unable to cut nails herself since the patient cannot reach her nails.These nails are painful walking and wearing shoes.  This patient presents for at risk foot care today.   General Appearance  Alert, conversant and in no acute stress.  Vascular  Dorsalis pedis and posterior tibial  pulses are  weakly palpable  bilaterally.  Capillary return is within normal limits  bilaterally. Temperature is within normal limits  bilaterally.  Neurologic  Senn-Weinstein monofilament wire test absent   bilaterally. Muscle power within normal limits bilaterally.  Nails Thick disfigured discolored nails with subungual debris  from hallux to fifth toes bilaterally. No evidence of bacterial infection or drainage bilaterally. Healed left hallux.  Orthopedic  No limitations of motion  feet .  No crepitus or effusions noted.  No bony pathology or digital deformities noted.  Skin  normotropic skin noted bilaterally.  No signs of infections or ulcers noted.  Asymptomatic porokeratosis sub 5th met right foot.  Onychomycosis  Pain in right toes  Pain in left toes Porokeratosis right foot.  Consent was obtained for treatment procedures.   Mechanical debridement of nails 1-5  bilaterally performed with a nail nipper.  Filed with dremel without incident.     Return office visit 3 months                    Told patient to return for periodic foot care and evaluation due to potential at risk complications.   Gardiner Barefoot DPM

## 2023-11-19 ENCOUNTER — Encounter (HOSPITAL_BASED_OUTPATIENT_CLINIC_OR_DEPARTMENT_OTHER): Payer: Medicare PPO | Attending: Internal Medicine | Admitting: Internal Medicine

## 2023-11-19 DIAGNOSIS — E11622 Type 2 diabetes mellitus with other skin ulcer: Secondary | ICD-10-CM | POA: Insufficient documentation

## 2023-11-19 DIAGNOSIS — L97822 Non-pressure chronic ulcer of other part of left lower leg with fat layer exposed: Secondary | ICD-10-CM | POA: Diagnosis not present

## 2023-11-19 DIAGNOSIS — I890 Lymphedema, not elsewhere classified: Secondary | ICD-10-CM | POA: Diagnosis not present

## 2023-11-19 DIAGNOSIS — I5042 Chronic combined systolic (congestive) and diastolic (congestive) heart failure: Secondary | ICD-10-CM | POA: Insufficient documentation

## 2023-11-19 DIAGNOSIS — Z79899 Other long term (current) drug therapy: Secondary | ICD-10-CM | POA: Insufficient documentation

## 2023-11-19 DIAGNOSIS — I872 Venous insufficiency (chronic) (peripheral): Secondary | ICD-10-CM | POA: Insufficient documentation

## 2023-11-21 NOTE — Progress Notes (Signed)
SHADA, SHOLTZ ([id number]) [id number] Page 1 of 5 Visit Report for 11/19/2023 HPI Details Patient Name: Date of Service: [name], [name] 11/19/2023 11:00 A M Medical Record Number: [id number] Patient Account Number: [id number] Date of Birth/Sex: Treating RN: 1931-05-29 (89 y.o. F) Primary Care Provider: [name] Other Clinician: Referring Provider: Treating Provider/Extender: [name] in Treatment: 20 History of Present Illness HPI Description: 07/02/2023 Ms. 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 She is not quite sure how the right upper leg wound started. The left lower extremity wound started out as blisters. She has compression stockings but has not been wearing them. She takes Lasix 20 mg daily. She currently denies signs of infection. In office ABIs were 0.57 on the left however dorsalis pedal pulses and posterior tibialis pulses were heard on Doppler. 8/6; patient presents for follow-up. We have been using antibiotic ointment with Hydrofera Blue under Kerlix/Coban to the left lower extremity. Hydrofera Blue to the right lower leg. She came in for nurse visit and wrap change. She reports no issues. She reports that she is scheduled for her ABIs next week. 8/13; patient presents for follow-up. We have been using Hydrofera Blue and Santyl to the left lower extremity under Kerlix/Coban. She has been using Hydrofera Blue to the right upper leg wound. She had ABIs completed yesterday that showed an ABI of 0.91 on the left and 0.92 on the right. She has no issues or complaints today. 8/20; patient presents for follow-up. We have been using Hydrofera Blue and Santyl to the left  lower extremity under 3 layer compression and Hydrofera Blue to the right upper leg wound. The right upper leg wound has healed. She feels that the wrap was too tight with the 3 layer. We will go back down to Kerlix/Coban. Other than that she has no issues or complaints. She denies signs of infection. Wounds Appear smaller. 8/27; patient presents for follow-up. We have been using Hydrofera Blue and Santyl to the left lower extremity wounds under Kerlix/Coban. She tolerated this well. She has no issues or complaints today. Wounds are smaller. 9/3; patient presents for follow-up. We have been using Hydrofera Blue and Santyl to the left lower extremity wound under Kerlix/Coban. Wound is smaller. 9/10; patient presents for follow-up. We have been using Hydrofera Blue and Santyl to the lower extremity under Kerlix/Coban. Wound appears smaller and well-healing. 9/17 continued improvement we have been using Santyl and Hydrofera Blue under kerlix Coban to the left leg wound largely secondary to chronic lymphedema 9/24; continued improvement in the wound size on the left anterior mid tibia. This is in the setting of significant left greater than right lower leg lymphedema. Initially the patient came in with wounds on both legs however the right leg is healed. The wound on the left leg is smaller. We will go ahead and order bilateral juxta lite stockings today 10/1; still a small open wound on the left anterior lower leg in the setting of chronic venous insufficiency and lymphedema. We have been using Santyl and Hydrofera Blue under Kerlix Coban. Everything is closed on the right leg and she has her own juxta lite which she will just the 30/40 mm compression. 10/15; still a small punched-out area on the left anterior lower leg in the  setting of chronic venous insufficiency and lymphedema. I changed her to Iodoflex last week. She is using juxta lites to the right leg and so far things seem stable here. 10/22;  same small punched-out area in the left anterior mid tibia. Setting of chronic venous insufficiency and lymphedema. I changed her to Iodoflex flex last week using juxta lites on the right leg and we are wrapping the left leg. Everything else is healed 10/29; small punched-out area on the left anterior mid tibia once again a nonviable surface. I used Iodoflex for the last 3 weeks. Prior to that [facility]. We still have trouble with the viability of the surface 11/5; still no improvement in the small wound on the left anterior lower leg I have been using Iodoflex for about 4 weeks prior to that Santyl really not able to get this to fully granulate. The wound does not probe to bone 11/12; perhaps some improvement in the left anterior lower leg. I used Santyl on this, then Iodoflex I am now using endoform. Wound surface looks better. 11/19; small open area has epithelialized over albeit in a divot in her skin. They have a JuxtaLite stocking 12/17; 1 month follow-up. The area on the left anterior lower leg remains closed. She is religiously using her juxta lite stocking on the right. I think we will be able to discharge her with careful instructions to pad the area on the left lower mid tibia and continue to use her juxta lite stockings lifelong Electronic Signature(s) Signed: 11/19/2023 4:32:13 PM By: Baltazar Najjar MD Entered By: Baltazar Najjar on 11/19/2023 12:32:44 [name] ([id number]) [id number] Page 2 of 5 -------------------------------------------------------------------------------- Physical Exam Details Patient Name: Date of Service: [name], [name] 11/19/2023 11:00 A M Medical Record Number: [id number] Patient Account Number: [id number] Date of Birth/Sex: Treating RN: [birth year] (89 y.o. F) Primary Care Provider: [name] Other Clinician: Referring Provider: Treating Provider/Extender: [name] in  Treatment: 20 Notes Wound exam; left anterior lower leg. There is no open area here. Small divot again is noted no open areas obvious. There is no evidence of surrounding infection Electronic Signature(s) Signed: 11/19/2023 4:32:13 PM By: Baltazar Najjar MD Entered By: Baltazar Najjar on 11/19/2023 12:33:55 -------------------------------------------------------------------------------- Physician Orders Details Patient Name: Date of Service: [name] 11/19/2023 11:00 A M Medical Record Number: [id number] Patient Account Number: [id number] Date of Birth/Sex: Treating RN: February 24, 1931 (89 y.o. [name] Primary Care Provider: [name] Other Clinician: Referring Provider: Treating Provider/Extender: [name] in Treatment: 20 The following information was scribed by: [name] The information was scribed for: Baltazar Najjar Verbal / Phone Orders: No Diagnosis Coding Discharge From [facility] Discharge from Wound Care Center - Compression stockings WEAR for LIFE! Edema Control - Orders / Instructions Elevate legs to the level of the heart or above for 30 minutes daily and/or when sitting for 3-4 times a day throughout the day. Avoid standing for long periods of time. Patient to wear own compression stockings every day. Exercise regularly Moisturize legs daily. - every night before bed. Compression stocking or Garment 20-30 mm/Hg pressure to: - juxtalite HD apply in the morning and remove at night. Electronic Signature(s) Signed: 11/19/2023 4:32:13 PM By: Baltazar Najjar MD Signed: 11/20/2023 5:35:56 PM By: [name] RN, BSN Entered By: [name] on 11/19/2023 11:15:32 -------------------------------------------------------------------------------- Problem List Details Patient Name: Date of Service: [name] 11/19/2023 11:00 A M Medical Record Number: [id number] Patient Account Number:  [id number] Date  of Birth/Sex: Treating RN: [birth year] (89 y.o. Leesha Kyzer, Katherin G ([id number]) [id number] Page 3 of 5 Primary Care Provider: [name] Other Clinician: Referring Provider: Treating Provider/Extender: [name] in Treatment: 20 Active Problems ICD-10 Encounter Code Description Active Date MDM Diagnosis [phone] Non-pressure chronic ulcer of other part of left lower leg with fat layer exposed7/30/2024 No Yes I89.0 Lymphedema, not elsewhere classified 07/02/2023 No Yes E11.622 Type 2 diabetes mellitus with other skin ulcer 07/02/2023 No Yes I50.42 Chronic combined systolic (congestive) and diastolic (congestive) heart failure 07/02/2023 No Yes Inactive Problems ICD-10 Code Description Active Date Inactive Date L97.812 Non-pressure chronic ulcer of other part of right lower leg with fat layer exposed 07/02/2023 07/02/2023 Resolved Problems Electronic Signature(s) Signed: 11/19/2023 4:32:13 PM By: Baltazar Najjar MD Entered By: Baltazar Najjar on 11/19/2023 12:31:32 -------------------------------------------------------------------------------- Progress Note Details Patient Name: Date of Service: [name] 11/19/2023 11:00 A M Medical Record Number: [id number] Patient Account Number: [id number] Date of Birth/Sex: Treating RN: [birth year] (89 y.o. F) Primary Care Provider: [name] Other Clinician: Referring Provider: Treating Provider/Extender: [name] in Treatment: 20 Subjective History of Present Illness (HPI) 07/02/2023 Ms. 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 She is not quite sure how the right upper leg wound started.  The left lower extremity wound started out as blisters. She has compression stockings but has not been wearing them. She takes Lasix 20 mg daily. She currently denies signs of infection. In office ABIs were 0.57 on the left however dorsalis pedal pulses and posterior tibialis pulses were heard on Doppler. 8/6; patient presents for follow-up. We have been using antibiotic ointment with Hydrofera Blue under Kerlix/Coban to the left lower extremity. Hydrofera Blue to the right lower leg. She came in for nurse visit and wrap change. She reports no issues. She reports that she is scheduled for her ABIs next week. 8/13; patient presents for follow-up. We have been using Hydrofera Blue and Santyl to the left lower extremity under Kerlix/Coban. She has been using Hydrofera Blue to the right upper leg wound. She had ABIs completed yesterday that showed an ABI of 0.91 on the left and 0.92 on the right. She has no issues or complaints today. 8/20; patient presents for follow-up. We have been using Hydrofera Blue and Santyl to the left lower extremity under 3 layer compression and Hydrofera Blue to the right upper leg wound. The right upper leg wound has healed. She feels that the wrap was too tight with the 3 layer. We will go back down to Kerlix/Coban. [name], [name] ([id number]) [id number] Page 4 of 5 Other than that she has no issues or complaints. She denies signs of infection. Wounds Appear smaller. 8/27; patient presents for follow-up. We have been using Hydrofera Blue and Santyl to the left lower extremity wounds under Kerlix/Coban. She tolerated this well. She has no issues or complaints today. Wounds are smaller. 9/3; patient presents for follow-up. We have been using Hydrofera Blue and Santyl to the left lower extremity wound under Kerlix/Coban. Wound is smaller. 9/10; patient presents for follow-up. We have been using Hydrofera Blue and Santyl to the lower extremity  under Kerlix/Coban. Wound appears smaller and well-healing. 9/17 continued improvement we have been using Santyl and  Hydrofera Blue under kerlix Coban to the left leg wound largely secondary to chronic lymphedema 9/24; continued improvement in the wound size on the left anterior mid tibia. This is in the setting of significant left greater than right lower leg lymphedema. Initially the patient came in with wounds on both legs however the right leg is healed. The wound on the left leg is smaller. We will go ahead and order bilateral juxta lite stockings today 10/1; still a small open wound on the left anterior lower leg in the setting of chronic venous insufficiency and lymphedema. We have been using Santyl and Hydrofera Blue under Kerlix Coban. Everything is closed on the right leg and she has her own juxta lite which she will just the 30/40 mm compression. 10/15; still a small punched-out area on the left anterior lower leg in the setting of chronic venous insufficiency and lymphedema. I changed her to Iodoflex last week. She is using juxta lites to the right leg and so far things seem stable here. 10/22; same small punched-out area in the left anterior mid tibia. Setting of chronic venous insufficiency and lymphedema. I changed her to Iodoflex flex last week using juxta lites on the right leg and we are wrapping the left leg. Everything else is healed 10/29; small punched-out area on the left anterior mid tibia once again a nonviable surface. I used Iodoflex for the last 3 weeks. Prior to that [facility]. We still have trouble with the viability of the surface 11/5; still no improvement in the small wound on the left anterior lower leg I have been using Iodoflex for about 4 weeks prior to that Santyl really not able to get this to fully granulate. The wound does not probe to bone 11/12; perhaps some improvement in the left anterior lower leg. I used Santyl on this, then Iodoflex I am now using  endoform. Wound surface looks better. 11/19; small open area has epithelialized over albeit in a divot in her skin. They have a JuxtaLite stocking 12/17; 1 month follow-up. The area on the left anterior lower leg remains closed. She is religiously using her juxta lite stocking on the right. I think we will be able to discharge her with careful instructions to pad the area on the left lower mid tibia and continue to use her juxta lite stockings lifelong Objective Constitutional Vitals Time Taken: 11:02 AM, Height: 62 in, Weight: 220 lbs, BMI: 40.2, Temperature: 98.4 F, Pulse: 48 bpm, Respiratory Rate: 18 breaths/min, Blood Pressure: 153/59 mmHg. Assessment Active Problems ICD-10 Non-pressure chronic ulcer of other part of left lower leg with fat layer exposed Lymphedema, not elsewhere classified Type 2 diabetes mellitus with other skin ulcer Chronic combined systolic (congestive) and diastolic (congestive) heart failure Plan Discharge From [facility] Services: Discharge from Wound Care Center - Compression stockings WEAR for LIFE! Edema Control - Orders / Instructions: Elevate legs to the level of the heart or above for 30 minutes daily and/or when sitting for 3-4 times a day throughout the day. Avoid standing for long periods of time. Patient to wear own compression stockings every day. Exercise regularly Moisturize legs daily. - every night before bed. Compression stocking or Garment 20-30 mm/Hg pressure to: - juxtalite HD apply in the morning and remove at night. 1. I have advised to keep this area padded so to avoid friction with even the external compression stocking. 2. We had brought her back to make sure this area stayed closed and it has. She is compliant with her  stocking 3. She can be discharged from the clinic [name], [name] ([id number]) [id number] Page 5 of 5 Electronic Signature(s) Signed: 11/19/2023 4:32:13 PM By: Baltazar Najjar MD Entered By:  Baltazar Najjar on 11/19/2023 12:35:06 -------------------------------------------------------------------------------- SuperBill Details Patient Name: Date of Service: [name] 11/19/2023 Medical Record Number: [id number] Patient Account Number: [id number] Date of Birth/Sex: Treating RN: 24-Feb-1931 (89 y.o. [name], [name] Primary Care Provider: [name] Other Clinician: Referring Provider: Treating Provider/Extender: [name] in Treatment: 20 Diagnosis Coding ICD-10 Codes Code Description [phone] Non-pressure chronic ulcer of other part of left lower leg with fat layer exposed I89.0 Lymphedema, not elsewhere classified E11.622 Type 2 diabetes mellitus with other skin ulcer I50.42 Chronic combined systolic (congestive) and diastolic (congestive) heart failure Facility Procedures : CPT4 Code: [id number] Description: 99213 - WOUND CARE VISIT-LEV 3 EST PT Modifier: Quantity: 1 Physician Procedures : CPT4 Code Description Modifier [id number] - WC PHYS LEVEL 2 - EST PT ICD-10 Diagnosis Description L97.822 Non-pressure chronic ulcer of other part of left lower leg with fat layer exposed I89.0 Lymphedema, not elsewhere classified E11.622 Type 2  diabetes mellitus with other skin ulcer Quantity: 1 Electronic Signature(s) Signed: 11/19/2023 4:32:13 PM By: Baltazar Najjar MD Entered By: Baltazar Najjar on 11/19/2023 12:35:25

## 2023-11-21 NOTE — Progress Notes (Signed)
[name], [name] ([id number]) [id number] Page 1 of 6 Visit Report for 11/19/2023 Arrival Information Details Patient Name: Date of Service: [name], [name] 11/19/2023 11:00 A M Medical Record Number: [id number] Patient Account Number: [id number] Date of Birth/Sex: Treating RN: 06-01-1931 (89 y.o. F) Primary Care [name]: [name] Other Clinician: Referring [name]: Treating [name]/Extender: [name] in Treatment: 20 Visit Information History Since Last Visit Added or deleted any medications: No Patient Arrived: [name] Any new allergies or adverse reactions: No Arrival Time: 10:57 Had a fall or experienced change in No Accompanied By: self activities of daily living that may affect Transfer Assistance: None risk of falls: Patient Identification Verified: Yes Signs or symptoms of abuse/neglect since last visito No Secondary Verification Process Completed: Yes Hospitalized since last visit: No Patient Requires Transmission-Based Precautions: No Implantable device outside of the clinic excluding No Patient Has Alerts: Yes cellular tissue based products placed in the center Patient Alerts: ABIs: R:0.92 L:0.91 8/24 since last visit: Has Dressing in Place as Prescribed: No Has Compression in Place as Prescribed: Yes Pain Present Now: No Electronic Signature(s) Signed: 11/20/2023 5:28:18 PM By: Thayer Dallas Entered By: Thayer Dallas on 11/19/2023 11:02:19 -------------------------------------------------------------------------------- Clinic Level of Care Assessment Details Patient Name: Date of Service: [name], [name] 11/19/2023 11:00 A M Medical Record Number: [id number] Patient Account Number: [id number] Date of Birth/Sex: Treating RN: 09-18-1931 (89 y.o. [name] Primary Care [name]: [name] Other Clinician: Referring [name]: Treating [name]/Extender: [name] in Treatment: 20 Clinic Level of Care Assessment Items TOOL 4 Quantity Score X- 1 0 Use when only an EandM is performed on FOLLOW-UP visit ASSESSMENTS - Nursing Assessment / Reassessment X- 1 10 Reassessment of Co-morbidities (includes updates in patient status) X- 1 5 Reassessment of Adherence to Treatment Plan ASSESSMENTS - Wound and Skin A ssessment / Reassessment []  - 0 Simple Wound Assessment / Reassessment - one wound []  - 0 Complex Wound Assessment / Reassessment - multiple wounds X- 1 10 Dermatologic / Skin Assessment (not related to wound area) ASSESSMENTS - Focused Assessment X- 1 5 Circumferential Edema Measurements - multi extremities []  - 0 Nutritional Assessment / Counseling / Intervention [name], [name] ([id number]) [id number] Page 2 of 6 []  - 0 Lower Extremity Assessment (monofilament, tuning fork, pulses) []  - 0 Peripheral Arterial Disease Assessment (using hand held doppler) ASSESSMENTS - Ostomy and/or Continence Assessment and Care []  - 0 Incontinence Assessment and Management []  - 0 Ostomy Care Assessment and Management (repouching, etc.) PROCESS - Coordination of Care X - Simple Patient / Family Education for ongoing care 1 15 []  - 0 Complex (extensive) Patient / Family Education for ongoing care X- 1 10 Staff obtains Chiropractor, Records, T Results / Process Orders est []  - 0 Staff telephones HHA, Nursing Homes / Clarify orders / etc []  - 0 Routine Transfer to another Facility (non-emergent condition) []  - 0 Routine Hospital Admission (non-emergent condition) []  - 0 New Admissions / Manufacturing engineer / Ordering NPWT Apligraf, etc. , []  - 0 Emergency Hospital Admission (emergent condition) X- 1 10 Simple Discharge Coordination []  - 0 Complex (extensive) Discharge Coordination PROCESS - Special Needs []  - 0 Pediatric / Minor Patient Management []  - 0 Isolation Patient  Management []  - 0 Hearing / Language / Visual special needs []  - 0 Assessment of Community assistance (transportation, D/C planning, etc.) []  - 0 Additional assistance / Altered mentation []  - 0 Support Surface(s) Assessment (bed, cushion, seat,  etc.) INTERVENTIONS - Wound Cleansing / Measurement []  - 0 Simple Wound Cleansing - one wound []  - 0 Complex Wound Cleansing - multiple wounds []  - 0 Wound Imaging (photographs - any number of wounds) []  - 0 Wound Tracing (instead of photographs) []  - 0 Simple Wound Measurement - one wound []  - 0 Complex Wound Measurement - multiple wounds INTERVENTIONS - Wound Dressings []  - 0 Small Wound Dressing one or multiple wounds []  - 0 Medium Wound Dressing one or multiple wounds []  - 0 Large Wound Dressing one or multiple wounds []  - 0 Application of Medications - topical []  - 0 Application of Medications - injection INTERVENTIONS - Miscellaneous []  - 0 External ear exam []  - 0 Specimen Collection (cultures, biopsies, blood, body fluids, etc.) []  - 0 Specimen(s) / Culture(s) sent or taken to Lab for analysis []  - 0 Patient Transfer (multiple staff / Nurse, adult / Similar devices) []  - 0 Simple Staple / Suture removal (25 or less) []  - 0 Complex Staple / Suture removal (26 or more) []  - 0 Hypo / Hyperglycemic Management (close monitor of Blood Glucose) [name], Katherine G ([id number]) [id number] Page 3 of 6 []  - 0 Ankle / Brachial Index (ABI) - do not check if billed separately X- 1 5 Vital Signs Has the patient been seen at the hospital within the last three years: Yes Total Score: 70 Level Of Care: New/Established - Level 2 Electronic Signature(s) Signed: 11/20/2023 5:35:56 PM By: Katherine Stall RN, BSN Entered By: [name] on 11/19/2023 11:16:58 -------------------------------------------------------------------------------- Encounter Discharge Information Details Patient Name: Date of  Service: [name] 11/19/2023 11:00 A M Medical Record Number: [id number] Patient Account Number: [id number] Date of Birth/Sex: Treating RN: 04/26/1931 (89 y.o. [name] Primary Care Katherine Roldan: [name] Other Clinician: Referring Elfreda Blanchet: Treating Kairen Hallinan/Extender: [name] in Treatment: 20 Encounter Discharge Information Items Discharge Condition: Stable Ambulatory Status: Walker Discharge Destination: Home Transportation: Private Auto Accompanied By: friend Schedule Follow-up Appointment: No Clinical Summary of Care: Electronic Signature(s) Signed: 11/20/2023 5:35:56 PM By: Katherine Stall RN, BSN Entered By: [name] on 11/19/2023 11:17:34 -------------------------------------------------------------------------------- Lower Extremity Assessment Details Patient Name: Date of Service: [name], [name] 11/19/2023 11:00 A M Medical Record Number: [id number] Patient Account Number: [id number] Date of Birth/Sex: Treating RN: Sep 04, 1931 (89 y.o. F) Primary Care Nevena Rozenberg: [name] Other Clinician: Referring Shanigua Gibb: Treating [name]/Extender: [name] in Treatment: 20 Edema Assessment Assessed: [Left: No] [Right: No] Edema: [Left: Ye] [Right: s] Calf Left: Right: Point of Measurement: 32 cm From Medial Instep 42.5 cm Ankle Left: Right: Point of Measurement: 11 cm From Medial Instep 22.3 cm Vascular Assessment Left: [[id number] Page 4 of 6Right:] Extremity colors, hair growth, and conditions: Extremity Color: [phone].pdf Page 4 of 6Normal] Hair Growth on Extremity: [phone].pdf Page 4 of 6Yes] Temperature of Extremity: [phone].pdf Page 4 of 6Hot] Capillary Refill: [phone].pdf Page 4 of 6< 3 seconds] Dependent Rubor:  [id number].pdf Page 4 of 6No No] Electronic Signature(s) Signed: 11/20/2023 5:28:18 PM By: Thayer Dallas Entered By: Thayer Dallas on 11/19/2023 11:04:24 -------------------------------------------------------------------------------- Multi Wound Chart Details Patient Name: Date of Service: [name] 11/19/2023 11:00 A M Medical Record Number: [id number] Patient Account Number: [id number] Date of Birth/Sex: Treating RN: Jul 10, 1931 (89 y.o. F) Primary Care Vernida Mcnicholas: [name] Other Clinician: Referring Darran Gabay: Treating Karesha Trzcinski/Extender: [name] in Treatment: 20 Vital Signs Height(in): 62 Pulse(bpm): 48 Weight(lbs): 220 Blood Pressure(mmHg): 153/59 Body Mass Index(BMI):  40.2 Temperature(F): 98.4 Respiratory Rate(breaths/min): 18 [Treatment Notes:Wound Assessments Treatment Notes] Electronic Signature(s) Signed: 11/19/2023 4:32:13 PM By: Baltazar Najjar MD Entered By: Baltazar Najjar on 11/19/2023 12:31:38 -------------------------------------------------------------------------------- Multi-Disciplinary Care Plan Details Patient Name: Date of Service: [name] 11/19/2023 11:00 A M Medical Record Number: [id number] Patient Account Number: [id number] Date of Birth/Sex: Treating RN: July 01, 1931 (89 y.o. [name], [name] Primary Care Samirah Scarpati: [name] Other Clinician: Referring Basia Mcginty: Treating Joselynne Killam/Extender: [name] in Treatment: 20 Active Inactive Electronic Signature(s) Signed: 11/20/2023 5:35:56 PM By: Katherine Stall RN, BSN Entered By: [name] on 11/19/2023 11:15:49 [name] ([id number]) [id number] Page 5 of 6 -------------------------------------------------------------------------------- Pain Assessment Details Patient Name: Date of Service: [name], [name] 11/19/2023 11:00 A M Medical  Record Number: [id number] Patient Account Number: [id number] Date of Birth/Sex: Treating RN: 1931-04-26 (89 y.o. F) Primary Care Mickey Esguerra: [name] Other Clinician: Referring Cristen Bredeson: Treating Krina Mraz/Extender: [name] in Treatment: 20 Active Problems Location of Pain Severity and Description of Pain Patient Has Paino No Site Locations Pain Management and Medication Current Pain Management: Electronic Signature(s) Signed: 11/20/2023 5:28:18 PM By: Thayer Dallas Entered By: Thayer Dallas on 11/19/2023 11:03:21 -------------------------------------------------------------------------------- Patient/Caregiver Education Details Patient Name: Date of Service: [name] 12/17/2024andnbsp11:00 A M Medical Record Number: [id number] Patient Account Number: [id number] Date of Birth/Gender: Treating RN: 1931/06/27 (89 y.o. [name] Primary Care Physician: [name] Other Clinician: Referring Physician: Treating Physician/Extender: [name] in Treatment: 20 Education Assessment Education Provided To: Patient Education Topics Provided Venous: Handouts: Controlling Swelling with Compression Stockings Methods: Explain/Verbal Responses: Reinforcements needed [name], [name] ([id number]) [id number].pdf Page 6 of 6 Electronic Signature(s) Signed: 11/20/2023 5:35:56 PM By: Katherine Stall RN, BSN Entered By: [name] on 11/19/2023 11:16:34 -------------------------------------------------------------------------------- Vitals Details Patient Name: Date of Service: [name] 11/19/2023 11:00 A M Medical Record Number: [id number] Patient Account Number: [id number] Date of Birth/Sex: Treating RN: Sep 19, 1931 (89 y.o. F) Primary Care Tane Biegler: [name] Other Clinician: Referring Solimar Maiden: Treating Orland Visconti/Extender: [name] in Treatment: 20 Vital Signs Time Taken: 11:02 Temperature (F): 98.4 Height (in): 62 Pulse (bpm): 48 Weight (lbs): 220 Respiratory Rate (breaths/min): 18 Body Mass Index (BMI): 40.2 Blood Pressure (mmHg): 153/59 Reference Range: 80 - 120 mg / dl Electronic Signature(s) Signed: 11/20/2023 5:28:18 PM By: Thayer Dallas Entered By: Thayer Dallas on 11/19/2023 11:03:14

## 2024-02-01 ENCOUNTER — Other Ambulatory Visit: Payer: Self-pay | Admitting: Cardiovascular Disease

## 2024-02-10 ENCOUNTER — Ambulatory Visit: Payer: Medicare PPO | Admitting: Podiatry

## 2024-02-10 ENCOUNTER — Encounter: Payer: Self-pay | Admitting: Podiatry

## 2024-02-10 DIAGNOSIS — E1142 Type 2 diabetes mellitus with diabetic polyneuropathy: Secondary | ICD-10-CM

## 2024-02-10 DIAGNOSIS — M79674 Pain in right toe(s): Secondary | ICD-10-CM | POA: Diagnosis not present

## 2024-02-10 DIAGNOSIS — B351 Tinea unguium: Secondary | ICD-10-CM

## 2024-02-10 DIAGNOSIS — M79675 Pain in left toe(s): Secondary | ICD-10-CM | POA: Diagnosis not present

## 2024-02-10 DIAGNOSIS — M2010 Hallux valgus (acquired), unspecified foot: Secondary | ICD-10-CM | POA: Diagnosis not present

## 2024-02-10 NOTE — Progress Notes (Signed)
This patient returns to my office for at risk foot care.  This patient requires this care by a professional since this patient will be at risk due to having diabetes.   This patient is unable to cut nails herself since the patient cannot reach her nails.These nails are painful walking and wearing shoes.  This patient presents for at risk foot care today.   General Appearance  Alert, conversant and in no acute stress.  Vascular  Dorsalis pedis and posterior tibial  pulses are  weakly palpable  bilaterally.  Capillary return is within normal limits  bilaterally. Temperature is within normal limits  bilaterally.  Neurologic  Senn-Weinstein monofilament wire test absent   bilaterally. Muscle power within normal limits bilaterally.  Nails Thick disfigured discolored nails with subungual debris  from hallux to fifth toes bilaterally. No evidence of bacterial infection or drainage bilaterally. Healed left hallux.  Orthopedic  No limitations of motion  feet .  No crepitus or effusions noted.  No bony pathology or digital deformities noted.  Skin  normotropic skin noted bilaterally.  No signs of infections or ulcers noted.  Asymptomatic porokeratosis sub 5th met right foot.  Onychomycosis  Pain in right toes  Pain in left toes Porokeratosis right foot.  Consent was obtained for treatment procedures.   Mechanical debridement of nails 1-5  bilaterally performed with a nail nipper.  Filed with dremel without incident.     Return office visit 3 months                    Told patient to return for periodic foot care and evaluation due to potential at risk complications.   Gardiner Barefoot DPM

## 2024-05-11 ENCOUNTER — Encounter: Payer: Self-pay | Admitting: Podiatry

## 2024-05-11 ENCOUNTER — Ambulatory Visit: Admitting: Podiatry

## 2024-05-11 DIAGNOSIS — M79675 Pain in left toe(s): Secondary | ICD-10-CM | POA: Diagnosis not present

## 2024-05-11 DIAGNOSIS — M79674 Pain in right toe(s): Secondary | ICD-10-CM | POA: Diagnosis not present

## 2024-05-11 DIAGNOSIS — E1142 Type 2 diabetes mellitus with diabetic polyneuropathy: Secondary | ICD-10-CM | POA: Diagnosis not present

## 2024-05-11 DIAGNOSIS — B351 Tinea unguium: Secondary | ICD-10-CM

## 2024-05-11 NOTE — Progress Notes (Signed)
 This patient returns to my office for at risk foot care.  This patient requires this care by a professional since this patient will be at risk due to having diabetes.   This patient is unable to cut nails herself since the patient cannot reach her nails.These nails are painful walking and wearing shoes.  This patient presents for at risk foot care today.   General Appearance  Alert, conversant and in no acute stress.  Vascular  Dorsalis pedis and posterior tibial  pulses are  weakly palpable  bilaterally.  Capillary return is within normal limits  bilaterally. Temperature is within normal limits  bilaterally.  Neurologic  Senn-Weinstein monofilament wire test absent   bilaterally. Muscle power within normal limits bilaterally.  Nails Thick disfigured discolored nails with subungual debris  from hallux to fifth toes bilaterally. No evidence of bacterial infection or drainage bilaterally. Healed left hallux.  Orthopedic  No limitations of motion  feet .  No crepitus or effusions noted.  No bony pathology or digital deformities noted.  Skin  normotropic skin noted bilaterally.  No signs of infections or ulcers noted.  Asymptomatic porokeratosis sub 5th met right foot.  Onychomycosis  Pain in right toes  Pain in left toes Porokeratosis right foot.  Consent was obtained for treatment procedures.   Mechanical debridement of nails 1-5  bilaterally performed with a nail nipper.  Filed with dremel without incident.     Return office visit 4 months                    Told patient to return for periodic foot care and evaluation due to potential at risk complications.   Ruffin Cotton DPM

## 2024-06-29 ENCOUNTER — Other Ambulatory Visit: Payer: Self-pay | Admitting: Cardiovascular Disease

## 2024-07-11 ENCOUNTER — Other Ambulatory Visit: Payer: Self-pay | Admitting: Cardiovascular Disease

## 2024-08-10 ENCOUNTER — Encounter: Payer: Self-pay | Admitting: Cardiovascular Disease

## 2024-08-10 ENCOUNTER — Ambulatory Visit: Attending: Cardiovascular Disease | Admitting: Cardiovascular Disease

## 2024-08-10 VITALS — BP 120/48 | HR 52 | Ht 62.0 in | Wt 205.0 lb

## 2024-08-10 DIAGNOSIS — I5043 Acute on chronic combined systolic (congestive) and diastolic (congestive) heart failure: Secondary | ICD-10-CM

## 2024-08-10 DIAGNOSIS — I2510 Atherosclerotic heart disease of native coronary artery without angina pectoris: Secondary | ICD-10-CM | POA: Diagnosis not present

## 2024-08-10 DIAGNOSIS — I10 Essential (primary) hypertension: Secondary | ICD-10-CM | POA: Diagnosis not present

## 2024-08-10 DIAGNOSIS — E782 Mixed hyperlipidemia: Secondary | ICD-10-CM

## 2024-08-10 DIAGNOSIS — R600 Localized edema: Secondary | ICD-10-CM

## 2024-08-10 NOTE — Assessment & Plan Note (Signed)
 History of mild nonobstructive CAD by cardiac catheterization performed with Dr. Burnard 06/06/2012.  Patient denies chest pain or shortness of breath.

## 2024-08-10 NOTE — Assessment & Plan Note (Signed)
 History of essential hypertension blood pressure measured today at 120/48.  She is on amlodipine hydralazine  and metoprolol  as well as Benicar.

## 2024-08-10 NOTE — Patient Instructions (Signed)

## 2024-08-10 NOTE — Assessment & Plan Note (Signed)
 History of hyperlipidemia on statin therapy followed by her PCP.  Her last lipid profile performed 10/08/2023 revealed total cholesterol 119, LDL 59 and HDL of 44.

## 2024-08-10 NOTE — Assessment & Plan Note (Signed)
 Bilateral lower extremity edema thought to be related to diastolic dysfunction and venous insufficiency.  She has had venous ulcers in the past followed at the wound care center.  She does wear compression stockings and is on oral diuretic.

## 2024-08-10 NOTE — Progress Notes (Signed)
 08/10/2024 [name]   June 18, 1931  [id number]  Primary Physician Theo Iha, MD Primary Cardiologist: Dorn JINNY Lesches MD FACP, [place], [place], [place]  HPI:  [name] is a 89 y.o.   moderately overweight widowed African American female, mother of 60, grandmother to 59 grandchildren, who  I saw her in the office 08/06/2023.  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  She is accompanied by her neighbor [name] today..She has a history of probable nonischemic cardiomyopathy with moderate LV dysfunction by 2D echocardiogram, last checked June 03, 2012, with an EF of 35% to 40%. She was catheterized by Dr. Charlena Sor, June 06, 2012, revealing a similar EF with no significant CAD, but this was in the setting of sepsis, requiring intubation, and congestive heart failure, responding to antibiotics and diuresis. Her other problems include hypertension, hyperlipidemia, and diabetes.  She is otherwise asymptomatic. Since I saw her one year ago she's remained clinically stable. She has had some venous stasis ulcers on her legs followed by Dr. Levorn Reichert at the [facility] wound care clinic. These ulcers have since healed and she wears compression stockings. She had a bladder tack operation for prolapsed bladder at [facility] on October 12 , 2017.     Since I saw her in the office a year ago she continues to do well.  She continues to take furosemide  for lower extremity edema which she increases when necessary.  She wears compression stockings.  She walks with a walker.  She denies chest pain or shortness of breath.  She apparently had a left lower extremity venous stasis ulcer followed by Dr. Rosan at the wound care center.   Current Meds  Medication Sig   ACCU-CHEK SOFTCLIX LANCETS lancets    ALPRAZolam  (XANAX ) 0.25 MG tablet Take 0.25 mg by mouth 2 (two) times daily as needed.   amLODipine  (NORVASC) 10 MG tablet    aspirin  81 MG chewable tablet Chew 81 mg by mouth daily.    benzonatate  (TESSALON ) 100 MG capsule    bimatoprost (LUMIGAN) 0.01 % SOLN Lumigan 0.01 % eye drops   Blood Glucose Monitoring Suppl (ACCU-CHEK AVIVA PLUS) W/DEVICE KIT    calcium -vitamin D  (OSCAL WITH D) 500-200 MG-UNIT per tablet Take 1 tablet by mouth daily.   Coenzyme Q10 (CO Q-10) 200 MG CAPS Take 200 mg by mouth daily.   FeFum-FePoly-FA-B Cmp-C-Biot (INTEGRA PLUS) CAPS daily.    furosemide  (LASIX ) 40 MG tablet TAKE 1 TABLET EVERY DAY   guaiFENesin -dextromethorphan  (ROBITUSSIN DM) 100-10 MG/5ML syrup Take 5 mLs by mouth every 4 (four) hours as needed for cough.   hydrALAZINE  (APRESOLINE ) 50 MG tablet Take 1.5 tablets (75 mg total) by mouth 2 (two) times daily.   metoprolol  succinate (TOPROL -XL) 25 MG 24 hr tablet Take 0.5 tablets (12.5 mg total) by mouth daily. Take with or immediately following a meal.   Multiple Vitamin (MULTI-VITAMIN PO) Take 1 tablet by mouth daily.   MYRBETRIQ 25 MG TB24 tablet    nitroGLYCERIN  (NITROSTAT ) 0.4 MG SL tablet PLACE 1 TAB UNDER TONGUE AS NEEDED FOR CHEST PAIN. NO RELIEF AFTER 1 DOSE, CALL 911 AND REPEAT EVERY UPTO 3 TAB   olmesartan (BENICAR) 40 MG tablet    omega-3 acid ethyl esters (LOVAZA ) 1 G capsule Take 2 g by mouth daily.   omeprazole (PRILOSEC) 40 MG capsule Take 40 mg by mouth 2 (two)  times daily.    potassium chloride  SA (KLOR-CON  M) 20 MEQ tablet TAKE 1 TABLET EVERY DAY   simvastatin  (ZOCOR ) 10 MG tablet    traMADol  (ULTRAM ) 50 MG tablet Take 50 mg by mouth 2 (two) times daily as needed.     Allergies  Allergen Reactions   Lisinopril  Other (See Comments) and Cough    cough cough    Social History   Socioeconomic History   Marital status: Widowed    Spouse name: Not on file   Number of children: 6   Years of education: Not on file   Highest education level: Not on file  Occupational History   Not on file  Tobacco Use   Smoking status:  Never   Smokeless tobacco: Never  Substance and Sexual Activity   Alcohol use: No    Comment: 11/30/11 used to drink; not much; don't drink anymore   Drug use: No   Sexual activity: Never  Other Topics Concern   Not on file  Social History Narrative   Not on file   Social Drivers of Health   Financial Resource Strain: Not on file  Food Insecurity: Not on file  Transportation Needs: Not on file  Physical Activity: Not on file  Stress: Not on file  Social Connections: Not on file  Intimate Partner Violence: Not on file     Review of Systems: General: negative for chills, fever, night sweats or weight changes.  Cardiovascular: negative for chest pain, dyspnea on exertion, edema, orthopnea, palpitations, paroxysmal nocturnal dyspnea or shortness of breath Dermatological: negative for rash Respiratory: negative for cough or wheezing Urologic: negative for hematuria Abdominal: negative for nausea, vomiting, diarrhea, bright red blood per rectum, melena, or hematemesis Neurologic: negative for visual changes, syncope, or dizziness All other systems reviewed and are otherwise negative except as noted above.    Blood pressure (!) 120/48, pulse (!) 52, height 5' 2 (1.575 m), weight 205 lb (93 kg), SpO2 96%.  General appearance: alert and no distress Neck: no adenopathy, no carotid bruit, no JVD, supple, symmetrical, trachea midline, and thyroid not enlarged, symmetric, no tenderness/mass/nodules Lungs: clear to auscultation bilaterally Heart: Soft outflow tract murmur Extremities: Lower extremities wrapped and wearing compression stockings. Pulses: 2+ and symmetric Skin: Skin color, texture, turgor normal. No rashes or lesions Neurologic: Grossly normal  EKG EKG Interpretation Date/Time:  Monday August 10 2024 08:26:39 EDT Ventricular Rate:  52 PR Interval:  154 QRS Duration:  98 QT Interval:  458 QTC Calculation: 425 R Axis:   -59  Text Interpretation: Sinus  bradycardia Left axis deviation Low voltage QRS When compared with ECG of 06-Aug-2023 08:55, Premature ventricular complexes are no longer Present Questionable change in QRS duration Criteria for Anterior infarct are no longer Present Criteria for Anterolateral infarct are no longer Present Criteria for Inferior infarct are no longer Present Confirmed by Court Carrier [phone]) on 08/10/2024 8:28:41 AM    ASSESSMENT AND PLAN:   HTN (hypertension) History of essential hypertension blood pressure measured today at 120/48.  She is on amlodipine hydralazine  and metoprolol  as well as Benicar.  CAD (coronary artery disease), mild, 20% cath 06/06/12 History of mild nonobstructive CAD by cardiac catheterization performed with Dr. Burnard 06/06/2012.  Patient denies chest pain or shortness of breath.  Acute on chronic systolic and diastolic heart failure, NYHA class 2 (HCC) History of nonischemic cardiomyopathy with an EF of 35 to 40% back in 2013.  She is on GDMT.  Echo performed 11/09/2014 revealed normal  ejection fraction with moderate AI.  The patient denies symptoms of heart failure.  She does have chronic lower extremity edema on compression stockings and diuretics.  Hyperlipidemia History of hyperlipidemia on statin therapy followed by her PCP.  Her last lipid profile performed 10/08/2023 revealed total cholesterol 119, LDL 59 and HDL of 44.  Bilateral lower extremity edema Bilateral lower extremity edema thought to be related to diastolic dysfunction and venous insufficiency.  She has had venous ulcers in the past followed at the wound care center.  She does wear compression stockings and is on oral diuretic.     Dorn DOROTHA Lesches MD FACP,FACC,FAHA, [facility] 08/10/2024 8:38 AM

## 2024-08-10 NOTE — Assessment & Plan Note (Signed)
 History of nonischemic cardiomyopathy with an EF of 35 to 40% back in 2013.  She is on GDMT.  Echo performed 11/09/2014 revealed normal ejection fraction with moderate AI.  The patient denies symptoms of heart failure.  She does have chronic lower extremity edema on compression stockings and diuretics.

## 2024-08-11 ENCOUNTER — Telehealth: Payer: Self-pay | Admitting: Cardiovascular Disease

## 2024-08-11 MED ORDER — NITROGLYCERIN 0.4 MG SL SUBL: SUBLINGUAL_TABLET | SUBLINGUAL | 3 refills | Status: AC

## 2024-08-11 NOTE — Telephone Encounter (Signed)
 RX sent in

## 2024-08-11 NOTE — Telephone Encounter (Signed)
*  STAT* If patient is at the pharmacy, call can be transferred to refill team.   1. Which medications need to be refilled? (please list name of each medication and dose if known) nitroGLYCERIN  (NITROSTAT ) 0.4 MG SL tablet    2. Would you like to learn more about the convenience, safety, & potential cost savings by using the [facility]? No   3. Are you open to using the [facility] (Type [facility]. No   4. Which pharmacy/location (including street and city if local pharmacy) is medication to be sent to?[facility] - [place], [place] - [address]    5. Do they need a 30 day or 90 day supply? 90 day

## 2024-09-07 ENCOUNTER — Encounter: Payer: Self-pay | Admitting: Podiatry

## 2024-09-07 ENCOUNTER — Ambulatory Visit: Admitting: Podiatry

## 2024-09-07 DIAGNOSIS — E1142 Type 2 diabetes mellitus with diabetic polyneuropathy: Secondary | ICD-10-CM

## 2024-09-07 DIAGNOSIS — M79675 Pain in left toe(s): Secondary | ICD-10-CM | POA: Diagnosis not present

## 2024-09-07 DIAGNOSIS — M79674 Pain in right toe(s): Secondary | ICD-10-CM

## 2024-09-07 DIAGNOSIS — B351 Tinea unguium: Secondary | ICD-10-CM | POA: Diagnosis not present

## 2024-09-07 NOTE — Progress Notes (Signed)
 This patient returns to my office for at risk foot care.  This patient requires this care by a professional since this patient will be at risk due to having diabetes.   This patient is unable to cut nails herself since the patient cannot reach her nails.These nails are painful walking and wearing shoes.  This patient presents for at risk foot care today.   General Appearance  Alert, conversant and in no acute stress.  Vascular  Dorsalis pedis and posterior tibial  pulses are  weakly palpable  bilaterally.  Capillary return is within normal limits  bilaterally. Temperature is within normal limits  bilaterally.  Neurologic  Senn-Weinstein monofilament wire test absent   bilaterally. Muscle power within normal limits bilaterally.  Nails Thick disfigured discolored nails with subungual debris  from hallux to fifth toes bilaterally. No evidence of bacterial infection or drainage bilaterally. Healed left hallux.  Orthopedic  No limitations of motion  feet .  No crepitus or effusions noted.  No bony pathology or digital deformities noted.  Skin  normotropic skin noted bilaterally.  No signs of infections or ulcers noted.  Asymptomatic porokeratosis sub 5th met right foot.  Onychomycosis  Pain in right toes  Pain in left toes Porokeratosis right foot.  Consent was obtained for treatment procedures.   Mechanical debridement of nails 1-5  bilaterally performed with a nail nipper.  Filed with dremel without incident.     Return office visit 4 months                    Told patient to return for periodic foot care and evaluation due to potential at risk complications.   Ruffin Cotton DPM

## 2024-09-11 ENCOUNTER — Other Ambulatory Visit: Payer: Self-pay | Admitting: Cardiovascular Disease

## 2025-01-04 ENCOUNTER — Ambulatory Visit: Admitting: Podiatry
